# Patient Record
Sex: Male | Born: 1935 | Race: White | Hispanic: No | Marital: Married | Smoking: Never smoker
Health system: Southern US, Community
[De-identification: ages and names within clinical notes are randomized; demographics above are authoritative.]

## PROBLEM LIST (undated history)

## (undated) DIAGNOSIS — K7030 Alcoholic cirrhosis of liver without ascites: Secondary | ICD-10-CM

## (undated) DIAGNOSIS — N183 Chronic kidney disease, stage 3 (moderate): Secondary | ICD-10-CM

## (undated) DIAGNOSIS — E785 Hyperlipidemia, unspecified: Secondary | ICD-10-CM

## (undated) DIAGNOSIS — K3189 Other diseases of stomach and duodenum: Secondary | ICD-10-CM

## (undated) DIAGNOSIS — I5041 Acute combined systolic (congestive) and diastolic (congestive) heart failure: Secondary | ICD-10-CM

## (undated) DIAGNOSIS — K269 Duodenal ulcer, unspecified as acute or chronic, without hemorrhage or perforation: Secondary | ICD-10-CM

## (undated) DIAGNOSIS — N289 Disorder of kidney and ureter, unspecified: Secondary | ICD-10-CM

## (undated) DIAGNOSIS — R188 Other ascites: Secondary | ICD-10-CM

## (undated) DIAGNOSIS — M6282 Rhabdomyolysis: Secondary | ICD-10-CM

## (undated) DIAGNOSIS — I10 Essential (primary) hypertension: Secondary | ICD-10-CM

## (undated) DIAGNOSIS — G312 Degeneration of nervous system due to alcohol: Secondary | ICD-10-CM

## (undated) DIAGNOSIS — K635 Polyp of colon: Secondary | ICD-10-CM

## (undated) DIAGNOSIS — F1020 Alcohol dependence, uncomplicated: Secondary | ICD-10-CM

## (undated) DIAGNOSIS — K766 Portal hypertension: Secondary | ICD-10-CM

## (undated) DIAGNOSIS — F0390 Unspecified dementia without behavioral disturbance: Secondary | ICD-10-CM

## (undated) DIAGNOSIS — I422 Other hypertrophic cardiomyopathy: Secondary | ICD-10-CM

## (undated) DIAGNOSIS — K2950 Unspecified chronic gastritis without bleeding: Secondary | ICD-10-CM

## (undated) DIAGNOSIS — I350 Nonrheumatic aortic (valve) stenosis: Secondary | ICD-10-CM

## (undated) DIAGNOSIS — K5730 Diverticulosis of large intestine without perforation or abscess without bleeding: Secondary | ICD-10-CM

## (undated) DIAGNOSIS — S62109A Fracture of unspecified carpal bone, unspecified wrist, initial encounter for closed fracture: Secondary | ICD-10-CM

## (undated) DIAGNOSIS — J9600 Acute respiratory failure, unspecified whether with hypoxia or hypercapnia: Secondary | ICD-10-CM

## (undated) DIAGNOSIS — K219 Gastro-esophageal reflux disease without esophagitis: Secondary | ICD-10-CM

## (undated) HISTORY — DX: Chronic kidney disease, stage 3 (moderate): N18.3

## (undated) HISTORY — DX: Unspecified chronic gastritis without bleeding: K29.50

## (undated) HISTORY — DX: Gastro-esophageal reflux disease without esophagitis: K21.9

## (undated) HISTORY — DX: Other hypertrophic cardiomyopathy: I42.2

## (undated) HISTORY — DX: Hyperlipidemia, unspecified: E78.5

## (undated) HISTORY — DX: Alcoholic cirrhosis of liver without ascites: K70.30

## (undated) HISTORY — DX: Essential (primary) hypertension: I10

## (undated) HISTORY — PX: INGUINAL HERNIA REPAIR: SHX194

## (undated) HISTORY — DX: Alcohol dependence, uncomplicated: F10.20

## (undated) HISTORY — DX: Duodenal ulcer, unspecified as acute or chronic, without hemorrhage or perforation: K26.9

## (undated) HISTORY — DX: Disorder of kidney and ureter, unspecified: N28.9

---

## 1999-02-24 ENCOUNTER — Other Ambulatory Visit: Admission: RE | Admit: 1999-02-24 | Discharge: 1999-02-24 | Payer: Self-pay | Admitting: Gastroenterology

## 2004-09-19 ENCOUNTER — Ambulatory Visit: Payer: Self-pay | Admitting: Internal Medicine

## 2004-10-17 ENCOUNTER — Ambulatory Visit: Payer: Self-pay | Admitting: Internal Medicine

## 2004-10-31 ENCOUNTER — Ambulatory Visit: Payer: Self-pay | Admitting: Gastroenterology

## 2004-11-04 ENCOUNTER — Ambulatory Visit (HOSPITAL_COMMUNITY): Admission: RE | Admit: 2004-11-04 | Discharge: 2004-11-04 | Payer: Self-pay | Admitting: Gastroenterology

## 2004-11-09 DIAGNOSIS — K3189 Other diseases of stomach and duodenum: Secondary | ICD-10-CM

## 2004-11-09 DIAGNOSIS — R188 Other ascites: Secondary | ICD-10-CM

## 2004-11-09 HISTORY — DX: Other diseases of stomach and duodenum: K31.89

## 2004-11-09 HISTORY — DX: Other ascites: R18.8

## 2004-11-11 ENCOUNTER — Ambulatory Visit: Payer: Self-pay | Admitting: *Deleted

## 2004-11-11 ENCOUNTER — Ambulatory Visit: Payer: Self-pay | Admitting: Gastroenterology

## 2004-11-18 ENCOUNTER — Ambulatory Visit: Payer: Self-pay | Admitting: *Deleted

## 2004-11-18 ENCOUNTER — Ambulatory Visit: Payer: Self-pay | Admitting: Gastroenterology

## 2004-11-19 ENCOUNTER — Ambulatory Visit: Payer: Self-pay | Admitting: Internal Medicine

## 2004-11-19 ENCOUNTER — Inpatient Hospital Stay (HOSPITAL_COMMUNITY): Admission: AD | Admit: 2004-11-19 | Discharge: 2004-11-21 | Payer: Self-pay | Admitting: Gastroenterology

## 2004-11-19 ENCOUNTER — Ambulatory Visit: Payer: Self-pay | Admitting: Infectious Diseases

## 2004-11-19 ENCOUNTER — Encounter (INDEPENDENT_AMBULATORY_CARE_PROVIDER_SITE_OTHER): Payer: Self-pay | Admitting: Specialist

## 2004-12-04 ENCOUNTER — Ambulatory Visit: Payer: Self-pay | Admitting: Internal Medicine

## 2004-12-04 ENCOUNTER — Ambulatory Visit: Payer: Self-pay | Admitting: Gastroenterology

## 2005-01-05 ENCOUNTER — Ambulatory Visit: Payer: Self-pay | Admitting: Gastroenterology

## 2005-01-06 ENCOUNTER — Ambulatory Visit: Payer: Self-pay | Admitting: Gastroenterology

## 2005-01-07 DIAGNOSIS — F1020 Alcohol dependence, uncomplicated: Secondary | ICD-10-CM

## 2005-01-07 DIAGNOSIS — G312 Degeneration of nervous system due to alcohol: Secondary | ICD-10-CM

## 2005-01-07 DIAGNOSIS — M6282 Rhabdomyolysis: Secondary | ICD-10-CM

## 2005-01-07 HISTORY — DX: Alcohol dependence, uncomplicated: F10.20

## 2005-01-07 HISTORY — DX: Rhabdomyolysis: M62.82

## 2005-01-07 HISTORY — DX: Degeneration of nervous system due to alcohol: G31.2

## 2005-01-14 ENCOUNTER — Inpatient Hospital Stay (HOSPITAL_COMMUNITY): Admission: EM | Admit: 2005-01-14 | Discharge: 2005-01-17 | Payer: Self-pay | Admitting: Emergency Medicine

## 2005-01-14 ENCOUNTER — Ambulatory Visit: Payer: Self-pay | Admitting: Internal Medicine

## 2005-01-20 ENCOUNTER — Ambulatory Visit: Payer: Self-pay | Admitting: Gastroenterology

## 2005-01-26 ENCOUNTER — Ambulatory Visit: Payer: Self-pay | Admitting: Internal Medicine

## 2005-01-27 ENCOUNTER — Ambulatory Visit: Payer: Self-pay | Admitting: Gastroenterology

## 2005-02-19 ENCOUNTER — Ambulatory Visit: Payer: Self-pay | Admitting: Gastroenterology

## 2005-03-03 ENCOUNTER — Ambulatory Visit: Payer: Self-pay | Admitting: Gastroenterology

## 2005-03-24 ENCOUNTER — Ambulatory Visit: Payer: Self-pay | Admitting: Gastroenterology

## 2005-05-19 ENCOUNTER — Ambulatory Visit: Payer: Self-pay | Admitting: Gastroenterology

## 2005-06-16 ENCOUNTER — Ambulatory Visit: Payer: Self-pay | Admitting: Gastroenterology

## 2005-07-24 ENCOUNTER — Ambulatory Visit: Payer: Self-pay | Admitting: Gastroenterology

## 2005-09-24 ENCOUNTER — Ambulatory Visit: Payer: Self-pay | Admitting: Gastroenterology

## 2005-10-06 ENCOUNTER — Ambulatory Visit: Payer: Self-pay | Admitting: *Deleted

## 2005-10-23 ENCOUNTER — Ambulatory Visit: Payer: Self-pay

## 2005-12-25 ENCOUNTER — Ambulatory Visit: Payer: Self-pay | Admitting: Gastroenterology

## 2006-01-07 ENCOUNTER — Ambulatory Visit: Payer: Self-pay | Admitting: *Deleted

## 2006-02-04 ENCOUNTER — Ambulatory Visit: Payer: Self-pay | Admitting: Gastroenterology

## 2006-04-02 ENCOUNTER — Ambulatory Visit: Payer: Self-pay | Admitting: Gastroenterology

## 2006-06-15 ENCOUNTER — Ambulatory Visit: Payer: Self-pay | Admitting: Gastroenterology

## 2006-07-08 ENCOUNTER — Ambulatory Visit: Payer: Self-pay | Admitting: *Deleted

## 2006-07-22 ENCOUNTER — Ambulatory Visit: Payer: Self-pay | Admitting: *Deleted

## 2006-07-22 ENCOUNTER — Ambulatory Visit: Payer: Self-pay

## 2006-07-22 ENCOUNTER — Encounter: Payer: Self-pay | Admitting: Cardiology

## 2006-09-10 ENCOUNTER — Ambulatory Visit: Payer: Self-pay | Admitting: Gastroenterology

## 2006-11-26 ENCOUNTER — Ambulatory Visit: Payer: Self-pay | Admitting: *Deleted

## 2007-04-08 ENCOUNTER — Ambulatory Visit: Payer: Self-pay | Admitting: *Deleted

## 2007-04-15 ENCOUNTER — Ambulatory Visit: Payer: Self-pay | Admitting: *Deleted

## 2007-04-15 LAB — CONVERTED CEMR LAB
ALT: 34 units/L (ref 0–40)
AST: 46 units/L — ABNORMAL HIGH (ref 0–37)
Albumin: 3.7 g/dL (ref 3.5–5.2)
Alkaline Phosphatase: 162 units/L — ABNORMAL HIGH (ref 39–117)
BUN: 22 mg/dL (ref 6–23)
Bilirubin, Direct: 0.2 mg/dL (ref 0.0–0.3)
CO2: 27 meq/L (ref 19–32)
Calcium: 9.3 mg/dL (ref 8.4–10.5)
Chloride: 102 meq/L (ref 96–112)
Cholesterol: 130 mg/dL (ref 0–200)
Creatinine, Ser: 1.3 mg/dL (ref 0.4–1.5)
GFR calc Af Amer: 69 mL/min
GFR calc non Af Amer: 57 mL/min
Glucose, Bld: 151 mg/dL — ABNORMAL HIGH (ref 70–99)
HDL: 52.1 mg/dL (ref >39.0)
Hgb A1c MFr Bld: 6.6 % — ABNORMAL HIGH (ref 4.6–6.0)
LDL Cholesterol: 62 mg/dL (ref 0–99)
Potassium: 4.7 meq/L (ref 3.5–5.1)
Sodium: 136 meq/L (ref 135–145)
Total Bilirubin: 1.4 mg/dL — ABNORMAL HIGH (ref 0.3–1.2)
Total CHOL/HDL Ratio: 2.5
Total Protein: 7.6 g/dL (ref 6.0–8.3)
Triglycerides: 78 mg/dL (ref 0–149)
VLDL: 16 mg/dL (ref 0–40)

## 2007-07-07 ENCOUNTER — Ambulatory Visit: Payer: Self-pay

## 2007-07-07 ENCOUNTER — Encounter: Payer: Self-pay | Admitting: Cardiology

## 2007-07-19 ENCOUNTER — Ambulatory Visit: Payer: Self-pay | Admitting: Cardiology

## 2007-08-18 ENCOUNTER — Ambulatory Visit: Payer: Self-pay | Admitting: Cardiology

## 2007-08-18 LAB — CONVERTED CEMR LAB
BUN: 17 mg/dL (ref 6–23)
Basophils Absolute: 0 10*3/uL (ref 0.0–0.1)
Basophils Relative: 0.1 % (ref 0.0–1.0)
CO2: 28 meq/L (ref 19–32)
Calcium: 9 mg/dL (ref 8.4–10.5)
Chloride: 104 meq/L (ref 96–112)
Creatinine, Ser: 1.3 mg/dL (ref 0.4–1.5)
Eosinophils Absolute: 0.3 10*3/uL (ref 0.0–0.6)
Eosinophils Relative: 3.1 % (ref 0.0–5.0)
GFR calc Af Amer: 69 mL/min
GFR calc non Af Amer: 57 mL/min
Glucose, Bld: 146 mg/dL — ABNORMAL HIGH (ref 70–99)
HCT: 40.9 % (ref 39.0–52.0)
Hemoglobin: 13.7 g/dL (ref 13.0–17.0)
Hgb A1c MFr Bld: 6.3 % — ABNORMAL HIGH (ref 4.6–6.0)
Lymphocytes Relative: 10.2 % — ABNORMAL LOW (ref 12.0–46.0)
MCHC: 33.6 g/dL (ref 30.0–36.0)
MCV: 94.9 fL (ref 78.0–100.0)
Monocytes Absolute: 1 10*3/uL — ABNORMAL HIGH (ref 0.2–0.7)
Monocytes Relative: 10.1 % (ref 3.0–11.0)
Neutro Abs: 7.8 10*3/uL — ABNORMAL HIGH (ref 1.4–7.7)
Neutrophils Relative %: 76.5 % (ref 43.0–77.0)
Platelets: 181 10*3/uL (ref 150–400)
Potassium: 4.1 meq/L (ref 3.5–5.1)
RBC: 4.32 M/uL (ref 4.22–5.81)
RDW: 12.7 % (ref 11.5–14.6)
Sodium: 140 meq/L (ref 135–145)
WBC: 10.1 10*3/uL (ref 4.5–10.5)

## 2007-09-20 ENCOUNTER — Ambulatory Visit: Payer: Self-pay | Admitting: Cardiology

## 2007-09-20 LAB — CONVERTED CEMR LAB
ALT: 29 units/L (ref 0–53)
AST: 52 units/L — ABNORMAL HIGH (ref 0–37)
Albumin: 3.8 g/dL (ref 3.5–5.2)
Alkaline Phosphatase: 129 units/L — ABNORMAL HIGH (ref 39–117)
BUN: 10 mg/dL (ref 6–23)
Basophils Absolute: 0 10*3/uL (ref 0.0–0.1)
Basophils Relative: 0.4 % (ref 0.0–1.0)
Bilirubin, Direct: 0.5 mg/dL — ABNORMAL HIGH (ref 0.0–0.3)
CO2: 27 meq/L (ref 19–32)
Calcium: 9.1 mg/dL (ref 8.4–10.5)
Chloride: 100 meq/L (ref 96–112)
Creatinine, Ser: 1.3 mg/dL (ref 0.4–1.5)
Eosinophils Absolute: 0.2 10*3/uL (ref 0.0–0.6)
Eosinophils Relative: 1.4 % (ref 0.0–5.0)
GFR calc Af Amer: 69 mL/min
GFR calc non Af Amer: 57 mL/min
Glucose, Bld: 116 mg/dL — ABNORMAL HIGH (ref 70–99)
HCT: 34.7 % — ABNORMAL LOW (ref 39.0–52.0)
Hemoglobin: 12.3 g/dL — ABNORMAL LOW (ref 13.0–17.0)
INR: 1.1 — ABNORMAL HIGH (ref 0.8–1.0)
Lymphocytes Relative: 7.2 % — ABNORMAL LOW (ref 12.0–46.0)
MCHC: 35.4 g/dL (ref 30.0–36.0)
MCV: 93.6 fL (ref 78.0–100.0)
Monocytes Absolute: 1.4 10*3/uL — ABNORMAL HIGH (ref 0.2–0.7)
Monocytes Relative: 11.7 % — ABNORMAL HIGH (ref 3.0–11.0)
Neutro Abs: 9.6 10*3/uL — ABNORMAL HIGH (ref 1.4–7.7)
Neutrophils Relative %: 79.3 % — ABNORMAL HIGH (ref 43.0–77.0)
Platelets: 253 10*3/uL (ref 150–400)
Potassium: 4.4 meq/L (ref 3.5–5.1)
Prothrombin Time: 12.6 s (ref 10.9–13.3)
RBC: 3.71 M/uL — ABNORMAL LOW (ref 4.22–5.81)
RDW: 12.9 % (ref 11.5–14.6)
Sodium: 136 meq/L (ref 135–145)
Total Bilirubin: 1.8 mg/dL — ABNORMAL HIGH (ref 0.3–1.2)
Total Protein: 8 g/dL (ref 6.0–8.3)
WBC: 12.1 10*3/uL — ABNORMAL HIGH (ref 4.5–10.5)

## 2007-09-23 ENCOUNTER — Ambulatory Visit: Payer: Self-pay | Admitting: Cardiology

## 2007-09-23 LAB — CONVERTED CEMR LAB
Ammonia: 35 umol/L (ref 11–35)
BUN: 13 mg/dL (ref 6–23)
CO2: 28 meq/L (ref 19–32)
Calcium: 9.4 mg/dL (ref 8.4–10.5)
Chloride: 98 meq/L (ref 96–112)
Creatinine, Ser: 1.3 mg/dL (ref 0.4–1.5)
GFR calc Af Amer: 69 mL/min
GFR calc non Af Amer: 57 mL/min
Glucose, Bld: 154 mg/dL — ABNORMAL HIGH (ref 70–99)
Potassium: 4.6 meq/L (ref 3.5–5.1)
Sodium: 134 meq/L — ABNORMAL LOW (ref 135–145)

## 2007-09-28 ENCOUNTER — Ambulatory Visit: Payer: Self-pay | Admitting: Cardiology

## 2007-09-28 LAB — CONVERTED CEMR LAB
BUN: 24 mg/dL — ABNORMAL HIGH (ref 6–23)
CO2: 25 meq/L (ref 19–32)
Calcium: 9.5 mg/dL (ref 8.4–10.5)
Chloride: 97 meq/L (ref 96–112)
Creatinine, Ser: 1.5 mg/dL (ref 0.4–1.5)
GFR calc Af Amer: 59 mL/min
GFR calc non Af Amer: 48 mL/min
Glucose, Bld: 107 mg/dL — ABNORMAL HIGH (ref 70–99)
Potassium: 4.6 meq/L (ref 3.5–5.1)
Sodium: 132 meq/L — ABNORMAL LOW (ref 135–145)

## 2007-11-25 ENCOUNTER — Ambulatory Visit: Payer: Self-pay | Admitting: Cardiology

## 2007-12-22 ENCOUNTER — Ambulatory Visit: Payer: Self-pay | Admitting: Cardiology

## 2007-12-22 LAB — CONVERTED CEMR LAB
BUN: 25 mg/dL — ABNORMAL HIGH (ref 6–23)
Basophils Absolute: 0 10*3/uL (ref 0.0–0.1)
Basophils Relative: 0 % (ref 0.0–1.0)
CO2: 26 meq/L (ref 19–32)
Calcium: 9.2 mg/dL (ref 8.4–10.5)
Chloride: 103 meq/L (ref 96–112)
Creatinine, Ser: 1.2 mg/dL (ref 0.4–1.5)
Eosinophils Absolute: 0.3 10*3/uL (ref 0.0–0.6)
Eosinophils Relative: 2.9 % (ref 0.0–5.0)
GFR calc Af Amer: 76 mL/min
GFR calc non Af Amer: 63 mL/min
Glucose, Bld: 142 mg/dL — ABNORMAL HIGH (ref 70–99)
HCT: 42.4 % (ref 39.0–52.0)
Hemoglobin: 14.3 g/dL (ref 13.0–17.0)
Hgb A1c MFr Bld: 6.1 % — ABNORMAL HIGH (ref 4.6–6.0)
Lymphocytes Relative: 11.1 % — ABNORMAL LOW (ref 12.0–46.0)
MCHC: 33.7 g/dL (ref 30.0–36.0)
MCV: 94.1 fL (ref 78.0–100.0)
Monocytes Absolute: 1 10*3/uL — ABNORMAL HIGH (ref 0.2–0.7)
Monocytes Relative: 10.5 % (ref 3.0–11.0)
Neutro Abs: 7.3 10*3/uL (ref 1.4–7.7)
Neutrophils Relative %: 75.5 % (ref 43.0–77.0)
Platelets: 172 10*3/uL (ref 150–400)
Potassium: 4.4 meq/L (ref 3.5–5.1)
RBC: 4.51 M/uL (ref 4.22–5.81)
RDW: 13.9 % (ref 11.5–14.6)
Sodium: 136 meq/L (ref 135–145)
WBC: 9.7 10*3/uL (ref 4.5–10.5)

## 2008-01-03 ENCOUNTER — Emergency Department (HOSPITAL_COMMUNITY): Admission: EM | Admit: 2008-01-03 | Discharge: 2008-01-04 | Payer: Self-pay | Admitting: Emergency Medicine

## 2008-01-04 ENCOUNTER — Ambulatory Visit: Payer: Self-pay | Admitting: Cardiology

## 2008-01-23 ENCOUNTER — Encounter: Admission: RE | Admit: 2008-01-23 | Discharge: 2008-01-23 | Payer: Self-pay | Admitting: Neurology

## 2008-02-01 ENCOUNTER — Ambulatory Visit: Payer: Self-pay | Admitting: Cardiology

## 2008-02-03 ENCOUNTER — Encounter: Admission: RE | Admit: 2008-02-03 | Discharge: 2008-02-03 | Payer: Self-pay | Admitting: Cardiology

## 2008-04-09 ENCOUNTER — Telehealth: Payer: Self-pay | Admitting: Internal Medicine

## 2008-04-09 ENCOUNTER — Telehealth: Payer: Self-pay | Admitting: Gastroenterology

## 2008-04-18 ENCOUNTER — Telehealth: Payer: Self-pay | Admitting: Internal Medicine

## 2008-04-26 ENCOUNTER — Telehealth: Payer: Self-pay | Admitting: Internal Medicine

## 2008-05-24 ENCOUNTER — Ambulatory Visit: Payer: Self-pay | Admitting: Cardiology

## 2008-05-29 ENCOUNTER — Ambulatory Visit: Payer: Self-pay | Admitting: Cardiology

## 2008-05-29 LAB — CONVERTED CEMR LAB
ALT: 37 units/L (ref 0–53)
AST: 43 units/L — ABNORMAL HIGH (ref 0–37)
Albumin: 4.1 g/dL (ref 3.5–5.2)
Alkaline Phosphatase: 106 units/L (ref 39–117)
BUN: 27 mg/dL — ABNORMAL HIGH (ref 6–23)
Basophils Absolute: 0.1 10*3/uL (ref 0.0–0.1)
Basophils Relative: 1 % (ref 0.0–3.0)
Bilirubin, Direct: 0.2 mg/dL (ref 0.0–0.3)
CO2: 27 meq/L (ref 19–32)
Calcium: 9.1 mg/dL (ref 8.4–10.5)
Chloride: 99 meq/L (ref 96–112)
Cholesterol: 107 mg/dL (ref 0–200)
Creatinine, Ser: 1.5 mg/dL (ref 0.4–1.5)
Eosinophils Absolute: 0.3 10*3/uL (ref 0.0–0.7)
Eosinophils Relative: 4.1 % (ref 0.0–5.0)
GFR calc Af Amer: 59 mL/min
GFR calc non Af Amer: 48 mL/min
Glucose, Bld: 131 mg/dL — ABNORMAL HIGH (ref 70–99)
HCT: 40.2 % (ref 39.0–52.0)
HDL: 50.6 mg/dL (ref >39.0)
Hemoglobin: 14 g/dL (ref 13.0–17.0)
LDL Cholesterol: 49 mg/dL (ref 0–99)
Lymphocytes Relative: 15.1 % (ref 12.0–46.0)
MCHC: 34.8 g/dL (ref 30.0–36.0)
MCV: 93.8 fL (ref 78.0–100.0)
Monocytes Absolute: 1 10*3/uL (ref 0.1–1.0)
Monocytes Relative: 12.2 % — ABNORMAL HIGH (ref 3.0–12.0)
Neutro Abs: 5.3 10*3/uL (ref 1.4–7.7)
Neutrophils Relative %: 67.6 % (ref 43.0–77.0)
Platelets: 166 10*3/uL (ref 150–400)
Potassium: 4.9 meq/L (ref 3.5–5.1)
RBC: 4.29 M/uL (ref 4.22–5.81)
RDW: 12.9 % (ref 11.5–14.6)
Sodium: 135 meq/L (ref 135–145)
TSH: 0.72 microintl units/mL (ref 0.35–5.50)
Total Bilirubin: 0.7 mg/dL (ref 0.3–1.2)
Total CHOL/HDL Ratio: 2.1
Total Protein: 7.6 g/dL (ref 6.0–8.3)
Triglycerides: 38 mg/dL (ref 0–149)
VLDL: 8 mg/dL (ref 0–40)
WBC: 7.9 10*3/uL (ref 4.5–10.5)

## 2008-06-06 ENCOUNTER — Telehealth: Payer: Self-pay | Admitting: Internal Medicine

## 2008-07-09 ENCOUNTER — Telehealth (INDEPENDENT_AMBULATORY_CARE_PROVIDER_SITE_OTHER): Payer: Self-pay | Admitting: *Deleted

## 2008-07-13 ENCOUNTER — Ambulatory Visit: Payer: Self-pay | Admitting: Internal Medicine

## 2008-08-23 ENCOUNTER — Encounter: Payer: Self-pay | Admitting: Internal Medicine

## 2008-08-27 ENCOUNTER — Encounter: Payer: Self-pay | Admitting: Internal Medicine

## 2008-10-02 ENCOUNTER — Telehealth: Payer: Self-pay | Admitting: Internal Medicine

## 2008-11-05 ENCOUNTER — Telehealth: Payer: Self-pay | Admitting: Internal Medicine

## 2008-11-14 ENCOUNTER — Ambulatory Visit: Payer: Self-pay | Admitting: Cardiology

## 2008-11-14 LAB — CONVERTED CEMR LAB
BUN: 21 mg/dL (ref 6–23)
Basophils Absolute: 0.3 10*3/uL — ABNORMAL HIGH (ref 0.0–0.1)
Basophils Relative: 2.8 % (ref 0.0–3.0)
CO2: 25 meq/L (ref 19–32)
Calcium: 9.2 mg/dL (ref 8.4–10.5)
Chloride: 101 meq/L (ref 96–112)
Creatinine, Ser: 1.4 mg/dL (ref 0.4–1.5)
Eosinophils Absolute: 0.4 10*3/uL (ref 0.0–0.7)
Eosinophils Relative: 4.1 % (ref 0.0–5.0)
GFR calc Af Amer: 63 mL/min
GFR calc non Af Amer: 52 mL/min
Glucose, Bld: 94 mg/dL (ref 70–99)
HCT: 39.8 % (ref 39.0–52.0)
Hemoglobin: 13.9 g/dL (ref 13.0–17.0)
Lymphocytes Relative: 10.8 % — ABNORMAL LOW (ref 12.0–46.0)
MCHC: 35.1 g/dL (ref 30.0–36.0)
MCV: 96.5 fL (ref 78.0–100.0)
Monocytes Absolute: 0.4 10*3/uL (ref 0.1–1.0)
Monocytes Relative: 3.7 % (ref 3.0–12.0)
Neutro Abs: 7.4 10*3/uL (ref 1.4–7.7)
Neutrophils Relative %: 78.6 % — ABNORMAL HIGH (ref 43.0–77.0)
Platelets: 172 10*3/uL (ref 150–400)
Potassium: 5.6 meq/L — ABNORMAL HIGH (ref 3.5–5.1)
RBC: 4.12 M/uL — ABNORMAL LOW (ref 4.22–5.81)
RDW: 11.9 % (ref 11.5–14.6)
Sodium: 135 meq/L (ref 135–145)
WBC: 9.5 10*3/uL (ref 4.5–10.5)

## 2008-11-20 ENCOUNTER — Ambulatory Visit: Payer: Self-pay | Admitting: Cardiology

## 2008-11-20 LAB — CONVERTED CEMR LAB
BUN: 18 mg/dL (ref 6–23)
CO2: 27 meq/L (ref 19–32)
Calcium: 9.2 mg/dL (ref 8.4–10.5)
Chloride: 102 meq/L (ref 96–112)
Creatinine, Ser: 1.4 mg/dL (ref 0.4–1.5)
GFR calc Af Amer: 63 mL/min
GFR calc non Af Amer: 52 mL/min
Glucose, Bld: 173 mg/dL — ABNORMAL HIGH (ref 70–99)
Potassium: 4.8 meq/L (ref 3.5–5.1)
Sodium: 135 meq/L (ref 135–145)

## 2009-04-22 ENCOUNTER — Telehealth: Payer: Self-pay | Admitting: Internal Medicine

## 2009-05-06 ENCOUNTER — Telehealth (INDEPENDENT_AMBULATORY_CARE_PROVIDER_SITE_OTHER): Payer: Self-pay | Admitting: *Deleted

## 2009-06-23 DIAGNOSIS — E785 Hyperlipidemia, unspecified: Secondary | ICD-10-CM

## 2009-06-23 DIAGNOSIS — I421 Obstructive hypertrophic cardiomyopathy: Secondary | ICD-10-CM

## 2009-06-23 DIAGNOSIS — I10 Essential (primary) hypertension: Secondary | ICD-10-CM

## 2009-06-25 ENCOUNTER — Ambulatory Visit: Payer: Self-pay | Admitting: Cardiology

## 2009-07-03 ENCOUNTER — Encounter: Payer: Self-pay | Admitting: Cardiology

## 2009-07-03 ENCOUNTER — Ambulatory Visit: Payer: Self-pay

## 2009-07-03 ENCOUNTER — Ambulatory Visit: Payer: Self-pay | Admitting: Cardiology

## 2009-07-05 LAB — CONVERTED CEMR LAB
ALT: 17 units/L (ref 0–53)
AST: 22 units/L (ref 0–37)
Albumin: 4 g/dL (ref 3.5–5.2)
Alkaline Phosphatase: 73 units/L (ref 39–117)
Basophils Absolute: 0 10*3/uL (ref 0.0–0.1)
Basophils Relative: 0 % (ref 0.0–3.0)
Bilirubin, Direct: 0.2 mg/dL (ref 0.0–0.3)
Cholesterol: 129 mg/dL (ref 0–200)
Eosinophils Absolute: 0.3 10*3/uL (ref 0.0–0.7)
Eosinophils Relative: 3.8 % (ref 0.0–5.0)
HCT: 43.7 % (ref 39.0–52.0)
HDL: 55.9 mg/dL (ref >39.00)
Hemoglobin: 15.2 g/dL (ref 13.0–17.0)
LDL Cholesterol: 57 mg/dL (ref 0–99)
Lymphocytes Relative: 10.8 % — ABNORMAL LOW (ref 12.0–46.0)
Lymphs Abs: 0.9 10*3/uL (ref 0.7–4.0)
MCHC: 34.7 g/dL (ref 30.0–36.0)
MCV: 98.1 fL (ref 78.0–100.0)
Monocytes Absolute: 1 10*3/uL (ref 0.1–1.0)
Monocytes Relative: 11.5 % (ref 3.0–12.0)
Neutro Abs: 6.1 10*3/uL (ref 1.4–7.7)
Neutrophils Relative %: 73.9 % (ref 43.0–77.0)
Platelets: 160 10*3/uL (ref 150.0–400.0)
RBC: 4.45 M/uL (ref 4.22–5.81)
RDW: 12 % (ref 11.5–14.6)
TSH: 0.97 microintl units/mL (ref 0.35–5.50)
Total Bilirubin: 1.4 mg/dL — ABNORMAL HIGH (ref 0.3–1.2)
Total CHOL/HDL Ratio: 2
Total Protein: 8.1 g/dL (ref 6.0–8.3)
Triglycerides: 81 mg/dL (ref 0.0–149.0)
VLDL: 16.2 mg/dL (ref 0.0–40.0)
WBC: 8.3 10*3/uL (ref 4.5–10.5)

## 2009-07-29 ENCOUNTER — Encounter: Payer: Self-pay | Admitting: Cardiology

## 2009-09-05 ENCOUNTER — Ambulatory Visit: Payer: Self-pay | Admitting: Cardiology

## 2009-09-05 DIAGNOSIS — F1010 Alcohol abuse, uncomplicated: Secondary | ICD-10-CM

## 2009-09-05 DIAGNOSIS — I714 Abdominal aortic aneurysm, without rupture, unspecified: Secondary | ICD-10-CM | POA: Insufficient documentation

## 2009-09-16 ENCOUNTER — Ambulatory Visit: Payer: Self-pay

## 2009-09-16 ENCOUNTER — Encounter: Payer: Self-pay | Admitting: Cardiology

## 2009-10-01 ENCOUNTER — Telehealth: Payer: Self-pay | Admitting: Internal Medicine

## 2009-12-04 ENCOUNTER — Ambulatory Visit: Payer: Self-pay | Admitting: Cardiology

## 2010-01-14 ENCOUNTER — Telehealth: Payer: Self-pay | Admitting: Internal Medicine

## 2010-03-10 ENCOUNTER — Ambulatory Visit: Payer: Self-pay | Admitting: Cardiology

## 2010-03-19 ENCOUNTER — Telehealth: Payer: Self-pay | Admitting: Internal Medicine

## 2010-04-14 ENCOUNTER — Telehealth: Payer: Self-pay | Admitting: Cardiology

## 2010-04-14 ENCOUNTER — Telehealth: Payer: Self-pay | Admitting: Internal Medicine

## 2010-04-15 ENCOUNTER — Telehealth: Payer: Self-pay | Admitting: Cardiology

## 2010-05-07 ENCOUNTER — Telehealth: Payer: Self-pay | Admitting: Internal Medicine

## 2010-07-09 ENCOUNTER — Ambulatory Visit: Payer: Self-pay | Admitting: Cardiology

## 2010-07-11 ENCOUNTER — Ambulatory Visit: Payer: Self-pay | Admitting: Cardiology

## 2010-07-11 LAB — CONVERTED CEMR LAB
ALT: 16 units/L (ref 0–53)
AST: 22 units/L (ref 0–37)
Albumin: 4.1 g/dL (ref 3.5–5.2)
Alkaline Phosphatase: 66 units/L (ref 39–117)
BUN: 17 mg/dL (ref 6–23)
Basophils Absolute: 0 10*3/uL (ref 0.0–0.1)
Basophils Relative: 0.6 % (ref 0.0–3.0)
Bilirubin, Direct: 0.2 mg/dL (ref 0.0–0.3)
CO2: 25 meq/L (ref 19–32)
Calcium: 8.9 mg/dL (ref 8.4–10.5)
Chloride: 99 meq/L (ref 96–112)
Cholesterol: 127 mg/dL (ref 0–200)
Creatinine, Ser: 1.3 mg/dL (ref 0.4–1.5)
Eosinophils Absolute: 0.3 10*3/uL (ref 0.0–0.7)
Eosinophils Relative: 3.8 % (ref 0.0–5.0)
GFR calc non Af Amer: 55.6 mL/min (ref >60)
Glucose, Bld: 117 mg/dL — ABNORMAL HIGH (ref 70–99)
HCT: 41.6 % (ref 39.0–52.0)
HDL: 47.7 mg/dL (ref >39.00)
Hemoglobin: 14.3 g/dL (ref 13.0–17.0)
LDL Cholesterol: 58 mg/dL (ref 0–99)
Lymphocytes Relative: 14.4 % (ref 12.0–46.0)
Lymphs Abs: 1.1 10*3/uL (ref 0.7–4.0)
MCHC: 34.3 g/dL (ref 30.0–36.0)
MCV: 99.5 fL (ref 78.0–100.0)
Monocytes Absolute: 0.8 10*3/uL (ref 0.1–1.0)
Monocytes Relative: 10.4 % (ref 3.0–12.0)
Neutro Abs: 5.5 10*3/uL (ref 1.4–7.7)
Neutrophils Relative %: 70.8 % (ref 43.0–77.0)
Platelets: 190 10*3/uL (ref 150.0–400.0)
Potassium: 4.5 meq/L (ref 3.5–5.1)
RBC: 4.18 M/uL — ABNORMAL LOW (ref 4.22–5.81)
RDW: 12.6 % (ref 11.5–14.6)
Sodium: 133 meq/L — ABNORMAL LOW (ref 135–145)
TSH: 0.86 microintl units/mL (ref 0.35–5.50)
Total Bilirubin: 0.9 mg/dL (ref 0.3–1.2)
Total CHOL/HDL Ratio: 3
Total Protein: 7 g/dL (ref 6.0–8.3)
Triglycerides: 105 mg/dL (ref 0.0–149.0)
VLDL: 21 mg/dL (ref 0.0–40.0)
WBC: 7.8 10*3/uL (ref 4.5–10.5)

## 2010-08-12 ENCOUNTER — Encounter: Payer: Self-pay | Admitting: Cardiology

## 2010-08-28 ENCOUNTER — Telehealth: Payer: Self-pay | Admitting: Internal Medicine

## 2010-10-10 ENCOUNTER — Encounter: Payer: Self-pay | Admitting: Cardiology

## 2010-10-10 ENCOUNTER — Ambulatory Visit: Payer: Self-pay | Admitting: Cardiology

## 2010-10-10 DIAGNOSIS — I872 Venous insufficiency (chronic) (peripheral): Secondary | ICD-10-CM

## 2010-10-20 ENCOUNTER — Ambulatory Visit: Payer: Self-pay | Admitting: Cardiology

## 2010-10-29 ENCOUNTER — Telehealth: Payer: Self-pay | Admitting: Cardiology

## 2010-11-05 ENCOUNTER — Encounter (INDEPENDENT_AMBULATORY_CARE_PROVIDER_SITE_OTHER): Payer: Self-pay | Admitting: *Deleted

## 2010-12-11 NOTE — Assessment & Plan Note (Signed)
Summary: 4 MONTH ROV      Allergies Added: NKDA  Visit Type:  Follow-up Primary Provider:  Jacques Navy MD  CC:  nocardaic complaits.  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 He has been doing well since his last visit from a cardiac standpoint with no chest pain shortness of breath or palpitations and minimal edema. He says he gets short of breath more easily when he walks up from taking out the trash when he think this is a gradual change over several years.  He also has a history of hyperlipidemia and venous insufficiency. His recent cholesterol values were quite good with an HDL of 40 and an LDL of 58. His creatinine was 1.3. He also had a recent echocardiogram which showed an intraventricular septal thickness of 13.6 and the posterior wall thickness of 12.3 and no subvalvular gradient. There was some basal septal hypertrophy.  His main problem is related to alcohol he has a history of alcohol abuse and is attending AA meetings 4 times a week. He says he is still drinking some says he goes to a bar by himself and maybe has one drink 2 times a week.  Current Medications (verified): 1)  Zolpidem Tartrate 10 Mg  Tabs (Zolpidem Tartrate) .Marland Kitchen.. 1 At Bedtime As Needed 2)  Omeprazole 40 Mg Cpdr (Omeprazole) .... Take 1 Q Am 3)  Spironolactone 25 Mg Tabs (Spironolactone) .... Take One Tablet Once Daily 4)  Furosemide 20 Mg Tabs (Furosemide) .... Take One Tablet Once Daily 5)  Metoprolol Succinate 100 Mg Xr24h-Tab (Metoprolol Succinate) .... Take 1and 1/2  Tablet By Mouth Daily 6)  Simvastatin 20 Mg Tabs (Simvastatin) .... Take One Tablet By Mouth Daily At Bedtime 7)  Amlodipine Besylate 10 Mg Tabs (Amlodipine Besylate) .... Take One Tablet By Mouth Daily 8)  Aspirin Ec 325 Mg Tbec (Aspirin) .... Take One-Half  Tablet By Mouth Daily  Allergies (verified): No Known Drug Allergies  Past  History:  Past Medical History: Reviewed history from 06/23/2009 and no changes required.  Hyperlipidemia  Hypertension  Cirrhosis  Alcoholic pancreatitis   1. Hypertrophic cardiomyopathy, stable. 2. Venous insufficiency of the lower extremity. 3. Hypertension. 4. Hyperlipidemia with intolerance to LIPITOR. 5. History of alcohol abuse, now stable following treatment at     [facility] 6. History of alcoholic pancreatitis and liver disease.   Review of Systems       ROS is negative except as outlined in HPI.   Vital Signs:  Patient profile:   77 year old male Height:      70 inches Weight:      182 pounds BMI:     26.21 Pulse rate:   69 / minute BP sitting:   140 / 74  (left arm) Cuff size:   regular  Vitals Entered By: Burnett Kanaris, CNA (July 11, 2010 11:44 AM)  Physical Exam  Additional Exam:  Gen. Well-nourished, in no distress   Neck: No JVD, thyroid not enlarged, no carotid bruits Lungs: No tachypnea, clear without rales, rhonchi or wheezes Cardiovascular: Rhythm regular, PMI not displaced,  heart sounds  normal, no murmurs or gallops, no peripheral edema, pulses normal in all 4 extremities. Abdomen: BS normal, abdomen soft and non-tender without masses or organomegaly, no hepatosplenomegaly. MS: No deformities, no cyanosis or clubbing   Neuro:  No focal sns   Skin:  no lesions    Impression & Recommendations:  Problem # 1:  HOCM / IHSS (ICD-425.1) He had a recent echo which showed a mild hypertrophic cardiomyopathy. He's had no symptoms from this was from her stable. His updated medication list for this problem includes:    Spironolactone 25 Mg Tabs (Spironolactone) .Marland Kitchen... Take one tablet once daily    Furosemide 20 Mg Tabs (Furosemide) .Marland Kitchen... Take one tablet once daily    Metoprolol Succinate 100 Mg Xr24h-tab (Metoprolol succinate) .Marland Kitchen... Take 1and 1/2  tablet by mouth daily    Amlodipine Besylate 10 Mg Tabs (Amlodipine besylate) .Marland Kitchen... Take one  tablet by mouth daily    Aspirin Ec 325 Mg Tbec (Aspirin) .Marland Kitchen... Take one-half  tablet by mouth daily  Problem # 2:  HYPERTENSION, BENIGN (ICD-401.1) His high blood pressure is well controlled on current medications. His updated medication list for this problem includes:    Spironolactone 25 Mg Tabs (Spironolactone) .Marland Kitchen... Take one tablet once daily    Furosemide 20 Mg Tabs (Furosemide) .Marland Kitchen... Take one tablet once daily    Metoprolol Succinate 100 Mg Xr24h-tab (Metoprolol succinate) .Marland Kitchen... Take 1and 1/2  tablet by mouth daily    Amlodipine Besylate 10 Mg Tabs (Amlodipine besylate) .Marland Kitchen... Take one tablet by mouth daily    Aspirin Ec 325 Mg Tbec (Aspirin) .Marland Kitchen... Take one-half  tablet by mouth daily  Problem # 3:  ALCOHOL ABUSE (ICD-305.00) [name] has a history of alcohol abuse and is attending AA meetings regularly. Unfortunately he is still drinking some he says he drinks about 2 drinks a week. I counseled him on this.  Patient Instructions: 1)  Your physician recommends that you schedule a follow-up appointment in: 3 months. 2)  Your physician recommends that you continue on your current medications as directed. Please refer to the Current Medication list given to you today.

## 2010-12-11 NOTE — Letter (Signed)
Summary: [facility] Flu Shot  [facility]   Imported By: Marylou Mccoy 08/28/2010 13:52:38  _____________________________________________________________________  External Attachment:    Type:   Image     Comment:   External Document

## 2010-12-11 NOTE — Assessment & Plan Note (Signed)
Summary: f58m  Medications Added FUROSEMIDE 20 MG TABS (FUROSEMIDE) take two tablets by mouth once daily      Allergies Added: NKDA  Visit Type:  3 mo f/u Primary Provider:  Jacques Navy MD  CC:  sob w/exertion...denies any other cardiac complaints today.  History of Present Illness: [name] is 77 years old and return for management of hypertrophic cardiomyopathy and hypertension. He has been doing well since his last visit from a cardiac standpoint with no chest pain shortness of breath or palpitations and minimal edema. He says he gets short of breath more easily when he walks up from taking out the trash when he think this is a gradual change over several years.  He also has a history of hyperlipidemia and venous insufficiency. He also had an echocardiogram which showed an intraventricular septal thickness of 13.6 and the posterior wall thickness of 12.3 and no subvalvular gradient. There was some basal septal hypertrophy.  He has noticed some increased swelling in his lower extremities over the past few months. He said no pain. He does have shortness of breath with exertion but does not feel this is changed.  His main problem is related to alcohol he has a history of alcohol abuse and is attending AA meetings 4 times a week. He says he is still drinking some says he goes to a bar by himself and maybe has one drink 2 times a week.  Current Medications (verified): 1)  Zolpidem Tartrate 10 Mg  Tabs (Zolpidem Tartrate) .Marland Kitchen.. 1 At Bedtime As Needed 2)  Omeprazole 40 Mg Cpdr (Omeprazole) .... Take 1 Q Am 3)  Spironolactone 25 Mg Tabs (Spironolactone) .... Take One Tablet Once Daily 4)  Furosemide 20 Mg Tabs (Furosemide) .... Take One Tablet Once Daily 5)  Metoprolol Succinate 100 Mg Xr24h-Tab (Metoprolol Succinate) .... Take 1and 1/2  Tablet By Mouth Daily 6)  Simvastatin 20 Mg Tabs (Simvastatin) .... Take One Tablet By Mouth Daily At Bedtime 7)  Amlodipine Besylate 10 Mg Tabs  (Amlodipine Besylate) .... Take One Tablet By Mouth Daily 8)  Aspirin Ec 325 Mg Tbec (Aspirin) .... Take One-Half  Tablet By Mouth Daily  Allergies (verified): No Known Drug Allergies  Past History:  Past Medical History: Reviewed history from 06/23/2009 and no changes required.  Hyperlipidemia  Hypertension  Cirrhosis  Alcoholic pancreatitis   1. Hypertrophic cardiomyopathy, stable. 2. Venous insufficiency of the lower extremity. 3. Hypertension. 4. Hyperlipidemia with intolerance to LIPITOR. 5. History of alcohol abuse, now stable following treatment at     [facility]. 6. History of alcoholic pancreatitis and liver disease.   Review of Systems       ROS is negative except as outlined in HPI.   Vital Signs:  Patient profile:   77 year old male Height:      70 inches Weight:      186.75 pounds Pulse rate:   76 / minute Pulse rhythm:   irregular BP sitting:   126 / 58  (left arm) Cuff size:   large  Vitals Entered By: Danielle Rankin, CMA (October 10, 2010 11:30 AM)  Physical Exam  Additional Exam:  Gen. Well-nourished, in no distress   Neck: No JVD, thyroid not enlarged, no carotid bruits Lungs: No tachypnea, clear without rales, rhonchi or wheezes Cardiovascular: Rhythm regular, PMI not displaced,  heart sounds  normal, grade 2/6 systolic ejection murmur at the left sternal edge, 1-2+ bilateral peripheral edema, pulses normal in all 4 extremities. Abdomen:  BS normal, abdomen soft and non-tender without masses or organomegaly, no hepatosplenomegaly. MS: No deformities, no cyanosis or clubbing   Neuro:  No focal sns   Skin:  no lesions    Impression & Recommendations:  Problem # 1:  HOCM / IHSS (ICD-425.1)  He has had no symptoms related to this and this appears stable. His updated medication list for this problem includes:    Spironolactone 25 Mg Tabs (Spironolactone) .Marland Kitchen... Take one tablet once daily    Furosemide 20 Mg Tabs (Furosemide) .Marland Kitchen... Take two  tablets by mouth once daily    Metoprolol Succinate 100 Mg Xr24h-tab (Metoprolol succinate) .Marland Kitchen... Take 1and 1/2  tablet by mouth daily    Amlodipine Besylate 10 Mg Tabs (Amlodipine besylate) .Marland Kitchen... Take one tablet by mouth daily    Aspirin Ec 325 Mg Tbec (Aspirin) .Marland Kitchen... Take one-half  tablet by mouth daily  His updated medication list for this problem includes:    Spironolactone 25 Mg Tabs (Spironolactone) .Marland Kitchen... Take one tablet once daily    Furosemide 20 Mg Tabs (Furosemide) .Marland Kitchen... Take two tablets by mouth once daily    Metoprolol Succinate 100 Mg Xr24h-tab (Metoprolol succinate) .Marland Kitchen... Take 1and 1/2  tablet by mouth daily    Amlodipine Besylate 10 Mg Tabs (Amlodipine besylate) .Marland Kitchen... Take one tablet by mouth daily    Aspirin Ec 325 Mg Tbec (Aspirin) .Marland Kitchen... Take one-half  tablet by mouth daily  Problem # 2:  EDEMA (ICD-782.3) He has 2+ edema of both lower extremities which is somewhat worse than before. There is no evidence of phlebitis. I think this is related to venous insufficiency. We'll increase his Lasix from 20 mg to 40 mg daily. We Will get laboratory studies in one week.  Problem # 3:  ALCOHOL ABUSE (ICD-305.00) Unfortunately Dr. 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 He is still going to [employer] regularly. I discussed with him that this is the biggest threat to his long-term health and well-being. He appears to have confidence in one of his counselors from [facility] who is frequently at the [employer]. Mrs. Egbert Garibaldi.   I suggested that he contact him to see if he could give him some one-on-one counseling. He indicated he would followup and try to do this.  Other Orders: EKG w/ Interpretation (93000)  Patient Instructions: 1)  Increase lasix to 20mg  two tablets by mouth once daily. 2)  You will need to return for labwork in 10 days:  liver/bmet/cbc/bnp (425.1;272.2;402.10)-  this will be due around 10/20/10. 3)  Your physician wants  you to follow-up in:  4 months with Dr. Riley Kill. You will receive a reminder letter in the mail two months in advance. If you don't receive a letter, please call our office to schedule the follow-up appointment.

## 2010-12-11 NOTE — Progress Notes (Signed)
Summary: REFILL  Phone Note Refill Request Message from:  Pharmacy on [address]  Refills Requested: Medication #1:  ZOLPIDEM TARTRATE 10 MG  TABS 1 at bedtime as needed Initial call taken by: Lamar Sprinkles, CMA,  Mar 19, 2010 4:17 PM  Follow-up for Phone Call        no OV since '07 that I could find. Option is 1 month of meds and OV, or since he has seen Charlies Constable routinely have Dr. Juanda Chance refill. Follow-up by: Jacques Navy MD,  Mar 20, 2010 5:10 AM  Additional Follow-up for Phone Call Additional follow up Details #1::        pt wants medication filled asap Additional Follow-up by: Ami Bullins CMA,  Mar 20, 2010 1:21 PM    Additional Follow-up for Phone Call Additional follow up Details #2::    OK for 1 month # 30 no further refills without office visit.  Follow-up by: Jacques Navy MD,  Mar 20, 2010 1:52 PM  Prescriptions: ZOLPIDEM TARTRATE 10 MG  TABS (ZOLPIDEM TARTRATE) 1 at bedtime as needed  #30 x 0   Entered by:   Ami Bullins CMA   Authorized by:   Jacques Navy MD   Signed by:   Bill Salinas CMA on 03/21/2010   Method used:   Telephoned to ...       [facility] (retail)       [address]       [place], [place]  [id number]       Ph: [phone] or [phone]       Fax: [phone]   RxID:   [id number]

## 2010-12-11 NOTE — Miscellaneous (Signed)
  Clinical Lists Changes  Observations: Added new observation of ECHOINTERP: Study Conclusions            1. Left ventricle: Basal septal hypetrophy and small LVOT but no        significant gradient        and no SAM The cavity size was normal. Wall thickness was        increased in a pattern of moderate LVH. Systolic function was        normal. The estimated ejection fraction was in the range of 55%        to 65%.     2. Aortic valve: Mild regurgitation.     3. Left atrium: The atrium was mildly dilated.     4. Atrial septum: No defect or patent foramen ovale was identified.     Echocardiography. M-mode, complete 2D, spectral Doppler, and color     Doppler. Height: Height: 177.8cm. Height: 70in. Weight: Weight:     78.9kg. Weight: 173.6lb. Body mass index: BMI: 25kg/m 2. Body     surface area: BSA: 1.66m 2. Patient status: Outpatient. Location:     [place] [facility].        (07/03/2009 15:53)      Echocardiogram  Procedure date:  07/03/2009  Findings:      Study Conclusions            1. Left ventricle: Basal septal hypetrophy and small LVOT but no        significant gradient        and no SAM The cavity size was normal. Wall thickness was        increased in a pattern of moderate LVH. Systolic function was        normal. The estimated ejection fraction was in the range of 55%        to 65%.     2. Aortic valve: Mild regurgitation.     3. Left atrium: The atrium was mildly dilated.     4. Atrial septum: No defect or patent foramen ovale was identified.     Echocardiography. M-mode, complete 2D, spectral Doppler, and color     Doppler. Height: Height: 177.8cm. Height: 70in. Weight: Weight:     78.9kg. Weight: 173.6lb. Body mass index: BMI: 25kg/m 2. Body     surface area: BSA: 1.31m 2. Patient status: Outpatient. Location:     [facility].

## 2010-12-11 NOTE — Progress Notes (Signed)
  Phone Note Refill Request Message from:  Fax from Pharmacy on April 14, 2010 10:12 AM  Refills Requested: Medication #1:  ZOLPIDEM TARTRATE 10 MG  TABS 1 at bedtime as needed   Last Refilled: 03/21/2010 recieved fax from [facility]. Please Advise refills.  Initial call taken by: Ami Bullins CMA,  April 14, 2010 10:13 AM  Follow-up for Phone Call        no, he needs to be seen Follow-up by: Etta Grandchild MD,  April 14, 2010 10:18 AM  Additional Follow-up for Phone Call Additional follow up Details #1::        did not fill faxed to pharm and informed pt that appt needs to be set up Additional Follow-up by: Ami Bullins CMA,  April 14, 2010 10:23 AM    Prescriptions: ZOLPIDEM TARTRATE 10 MG  TABS (ZOLPIDEM TARTRATE) 1 at bedtime as needed  #0 x 0   Entered by:   Ami Bullins CMA   Authorized by:   Jacques Navy MD   Signed by:   Bill Salinas CMA on 04/14/2010   Method used:   Faxed to ...       [facility] (retail)       [address]       [place], [place]  [address]       Ph: [phone] or [phone]       Fax: [phone]   RxID:   [id number]

## 2010-12-11 NOTE — Progress Notes (Signed)
  Phone Note Refill Request Message from:  Fax from Pharmacy on August 28, 2010 4:57 PM  Refills Requested: Medication #1:  OMEPRAZOLE 40 MG CPDR take 1 q am Initial call taken by: Ami Bullins CMA,  August 28, 2010 4:57 PM    Prescriptions: OMEPRAZOLE 40 MG CPDR (OMEPRAZOLE) take 1 q am  #30 Capsule x 3   Entered by:   Ami Bullins CMA   Authorized by:   Jacques Navy MD   Signed by:   Bill Salinas CMA on 08/28/2010   Method used:   Electronically to        [facility]* (retail)       [address]       [place], [place]  [id number]       Ph: [phone] or [phone]       Fax: [phone]   RxID:   [id number]

## 2010-12-11 NOTE — Assessment & Plan Note (Signed)
Summary: f57m      Allergies Added: NKDA  Visit Type:  Follow-up Primary Provider:  Jacques Navy MD  CC:  no complaints.  History of Present Illness: Patient is 77 years old and return for management of hypertrophic cardiomyopathy. He has a history of hypertrophic cardiomyopathy which has not been associated with any symptoms. He also has hypertension hyperlipidemia. History of intolerance to Lipitor in the past.  He has been doing well since his last visit with no chest pain palpitations or edema.  His other major problem is the history of alcohol. He is going to [employer] regularly 3 times a week and says he has not been drinking. This is an improvement from his last visit when he was having some occasional drinking.  Current Medications (verified): 1)  Zolpidem Tartrate 10 Mg  Tabs (Zolpidem Tartrate) .Marland Kitchen.. 1 At Bedtime As Needed 2)  Omeprazole 40 Mg Cpdr (Omeprazole) .... Take 1 Q Am 3)  Spironolactone 25 Mg Tabs (Spironolactone) .... Take One Tablet Once Daily 4)  Furosemide 20 Mg Tabs (Furosemide) .... Take One Tablet Once Daily 5)  Metoprolol Succinate 100 Mg Xr24h-Tab (Metoprolol Succinate) .... Take 1and 1/2  Tablet By Mouth Daily 6)  Simvastatin 20 Mg Tabs (Simvastatin) .... Take One Tablet By Mouth Daily At Bedtime 7)  Amlodipine Besylate 10 Mg Tabs (Amlodipine Besylate) .... Take One Tablet By Mouth Daily 8)  Aspirin Ec 325 Mg Tbec (Aspirin) .... Take One-Half  Tablet By Mouth Daily  Allergies (verified): No Known Drug Allergies  Past History:  Past Medical History: Reviewed history from 06/23/2009 and no changes required.  Hyperlipidemia  Hypertension  Cirrhosis  Alcoholic pancreatitis   1. Hypertrophic cardiomyopathy, stable. 2. Venous insufficiency of the lower extremity. 3. Hypertension. 4. Hyperlipidemia with intolerance to LIPITOR. 5. History of alcohol abuse, now stable following treatment at     [facility]. 6. History of alcoholic  pancreatitis and liver disease.   Review of Systems       ROS is negative except as outlined in HPI.   Vital Signs:  Patient profile:   77 year old male Height:      70 inches Weight:      174 pounds BMI:     25.06 Pulse rate:   70 / minute BP sitting:   122 / 66  (left arm) Cuff size:   regular  Vitals Entered By: Burnett Kanaris, CNA (Mar 10, 2010 8:54 AM)  Physical Exam  Additional Exam:  Gen. Well-nourished, in no distress   Neck: No JVD, thyroid not enlarged, no carotid bruits Lungs: No tachypnea, clear without rales, rhonchi or wheezes Cardiovascular: Rhythm regular, PMI not displaced,  heart sounds  normal, no murmurs or gallops, no peripheral edema, pulses normal in all 4 extremities. Abdomen: BS normal, abdomen soft and non-tender without masses or organomegaly, no hepatosplenomegaly. MS: No deformities, no cyanosis or clubbing   Neuro:  No focal sns   Skin:  no lesions    Impression & Recommendations:  Problem # 1:  HOCM / IHSS (ICD-425.1) He has had no symptoms of chest pain or swelling or shortness of breath. The thumb appears stable. His updated medication list for this problem includes:    Spironolactone 25 Mg Tabs (Spironolactone) .Marland Kitchen... Take one tablet once daily    Furosemide 20 Mg Tabs (Furosemide) .Marland Kitchen... Take one tablet once daily    Metoprolol Succinate 100 Mg Xr24h-tab (Metoprolol succinate) .Marland Kitchen... Take 1and 1/2  tablet by mouth daily  Amlodipine Besylate 10 Mg Tabs (Amlodipine besylate) .Marland Kitchen... Take one tablet by mouth daily    Aspirin Ec 325 Mg Tbec (Aspirin) .Marland Kitchen... Take one-half  tablet by mouth daily  Orders: EKG w/ Interpretation (93000)  Problem # 2:  HYPERTENSION, BENIGN (ICD-401.1) This is well controlled on current medications. His updated medication list for this problem includes:    Spironolactone 25 Mg Tabs (Spironolactone) .Marland Kitchen... Take one tablet once daily    Furosemide 20 Mg Tabs (Furosemide) .Marland Kitchen... Take one tablet once daily    Metoprolol  Succinate 100 Mg Xr24h-tab (Metoprolol succinate) .Marland Kitchen... Take 1and 1/2  tablet by mouth daily    Amlodipine Besylate 10 Mg Tabs (Amlodipine besylate) .Marland Kitchen... Take one tablet by mouth daily    Aspirin Ec 325 Mg Tbec (Aspirin) .Marland Kitchen... Take one-half  tablet by mouth daily  Orders: EKG w/ Interpretation (93000)  Problem # 3:  HYPERLIPIDEMIA-MIXED (ICD-272.4) We will get a lipid and liver profile prior to his next visit in 4 months. His updated medication list for this problem includes:    Simvastatin 20 Mg Tabs (Simvastatin) .Marland Kitchen... Take one tablet by mouth daily at bedtime  Orders: EKG w/ Interpretation (93000)  Problem # 4:  ALCOHOL ABUSE (ICD-305.00) He is attending AA meetings regularly and indicates he is not been drinking.  Patient Instructions: 1)  Your physician recommends that you schedule a follow-up appointment in: 4 months. 2)  Your physician recommends that you return for FASTING lab work in: 4 months- just prior to your followup appointment with Dr. Juanda Chance- lipid/liver/tsh/cbc/bmet (425.1;402.10;272.2)

## 2010-12-11 NOTE — Letter (Signed)
Summary: Generic Letter  [facility], Main Office  [address]   [place], [place] [address]   Phone: [phone]  Fax: [phone]        November 05, 2010 MRN: [id number]    [facility] [address] [place], [place]  [address]    Dear [name],  This is just a reminder that you are overdue for repeat labwork ordered by Dr. Juanda Chance at your last office visit. This was due around 10/20/10. Please stop by our office at your earliest convenience to have this done. Our lab hours are Monday- Friday from 8:30am to 2:00pm and 2:30pm to 4:30pm. You do not have to fast for this blood work.          Sincerely,  Sherri Rad, RN, BSN  This letter has been electronically signed by your physician.

## 2010-12-11 NOTE — Progress Notes (Signed)
  Phone Note Refill Request Message from:  Fax from Pharmacy on January 14, 2010 3:37 PM  Refills Requested: Medication #1:  OMEPRAZOLE 40 MG CPDR take 1 q am Initial call taken by: Ami Bullins CMA,  January 14, 2010 3:37 PM    Prescriptions: OMEPRAZOLE 40 MG CPDR (OMEPRAZOLE) take 1 q am  #30 Capsule x 3   Entered by:   Ami Bullins CMA   Authorized by:   Jacques Navy MD   Signed by:   Bill Salinas CMA on 01/14/2010   Method used:   Faxed to ...       [facility] (retail)       [address]       [place], [place]  [id number]       Ph: [phone] or [phone]       Fax: [phone]   RxID:   [id number]

## 2010-12-11 NOTE — Progress Notes (Signed)
Summary: ambien refill  Phone Note Refill Request Message from:  Patient on April 14, 2010 1:55 PM  Refills Requested: Medication #1:  ZOLPIDEM TARTRATE 10 MG  TABS 1 at bedtime as needed Pt wants a refill on this medication. He states he is not coming in for an office visit just to get refills approved on medication. I will send this to Dr Juanda Chance to see if refill want to be taken over by MD.   Initial call taken by: Ami Bullins CMA,  April 14, 2010 1:56 PM    Prescriptions: ZOLPIDEM TARTRATE 10 MG  TABS (ZOLPIDEM TARTRATE) 1 at bedtime as needed  #30 x 2   Entered by:   Sherri Rad, RN, BSN   Authorized by:   Lenoria Farrier, MD, [facility]   Signed by:   Sherri Rad, RN, BSN on 04/15/2010   Method used:   Telephoned to ...       [facility] (retail)       [address]       [place], [place]  [id number]       Ph: [phone] or [phone]       Fax: [phone]   RxID:   [id number]

## 2010-12-11 NOTE — Assessment & Plan Note (Signed)
Summary: 3 month rov      Allergies Added: NKDA  Primary Provider:  Jacques Navy MD  CC:  no chest.  History of Present Illness: The patient is 77 years old and return for management of hypertension and hypertrophic cardio myopathy. He's had no recent symptoms of chest pain shortness of breath or palpitations.  He also has a problem with alcohol. He says he's been going to [employer] 4 times a week. He is drinking some and is trying to quit entirely. He says when he drinks he usually goes to a bar alone.  Current Medications (verified): 1)  Zolpidem Tartrate 10 Mg  Tabs (Zolpidem Tartrate) .Marland Kitchen.. 1 At Bedtime As Needed 2)  Omeprazole 40 Mg Cpdr (Omeprazole) .... Take 1 Q Am 3)  Spironolactone 25 Mg Tabs (Spironolactone) .... Take One Tablet Once Daily 4)  Furosemide 20 Mg Tabs (Furosemide) .... Take One Tablet Once Daily 5)  Metoprolol Succinate 100 Mg Xr24h-Tab (Metoprolol Succinate) .... Take 1and 1/2  Tablet By Mouth Daily 6)  Simvastatin 20 Mg Tabs (Simvastatin) .... Take One Tablet By Mouth Daily At Bedtime 7)  Amlodipine Besylate 10 Mg Tabs (Amlodipine Besylate) .... Take One Tablet By Mouth Daily 8)  Aspirin Ec 325 Mg Tbec (Aspirin) .... Take One-Half  Tablet By Mouth Daily  Allergies (verified): No Known Drug Allergies  Past History:  Past Medical History: Reviewed history from 06/23/2009 and no changes required.  Hyperlipidemia  Hypertension  Cirrhosis  Alcoholic pancreatitis   1. Hypertrophic cardiomyopathy, stable. 2. Venous insufficiency of the lower extremity. 3. Hypertension. 4. Hyperlipidemia with intolerance to LIPITOR. 5. History of alcohol abuse, now stable following treatment at     [facility]. 6. History of alcoholic pancreatitis and liver disease.   Review of Systems       ROS is negative except as outlined in HPI.   Vital Signs:  Patient profile:   77 year old male Height:      70 inches Weight:      178 pounds Pulse rate:   86 /  minute BP sitting:   120 / 67  (left arm) Cuff size:   regular  Vitals Entered By: Burnett Kanaris, CNA (December 04, 2009 8:45 AM)  Physical Exam  Additional Exam:  Gen. Well-nourished, in no distress   Neck: No JVD, thyroid not enlarged, no carotid bruits Lungs: No tachypnea, clear without rales, rhonchi or wheezes Cardiovascular: Rhythm regular, PMI not displaced,  heart sounds  normal, grade 2/6 systolic ejection murmur, trace peripheral edema, pulses normal in all 4 extremities. Abdomen: BS normal, abdomen soft and non-tender without masses or organomegaly, no hepatosplenomegaly. MS: No deformities, no cyanosis or clubbing   Neuro:  No focal sns   Skin:  no lesions    Impression & Recommendations:  Problem # 1:  HOCM / IHSS (ICD-425.1) He has had no symptoms of chest pain or shortness of breath. This problem appears stable. His updated medication list for this problem includes:    Spironolactone 25 Mg Tabs (Spironolactone) .Marland Kitchen... Take one tablet once daily    Furosemide 20 Mg Tabs (Furosemide) .Marland Kitchen... Take one tablet once daily    Metoprolol Succinate 100 Mg Xr24h-tab (Metoprolol succinate) .Marland Kitchen... Take 1and 1/2  tablet by mouth daily    Amlodipine Besylate 10 Mg Tabs (Amlodipine besylate) .Marland Kitchen... Take one tablet by mouth daily    Aspirin Ec 325 Mg Tbec (Aspirin) .Marland Kitchen... Take one-half  tablet by mouth daily  Orders: EKG w/ Interpretation (93000)  Problem # 2:  AAA (ICD-441.4) His last ultrasound showed ectasia with no true aneurysm. I do not think further followup will be needed.  Problem # 3:  ALCOHOL ABUSE (ICD-305.00) This is the major problem. Unfortunately he is still drinking some.  I encouraged him to work on total cessation. He is going to AA regularly 4 times a week.  Patient Instructions: 1)  Your physician recommends that you schedule a follow-up appointment in: 4 months. 2)  Your physician recommends that you continue on your current medications as directed. Please refer  to the Current Medication list given to you today.

## 2010-12-11 NOTE — Progress Notes (Signed)
Summary: refill meds   Phone Note Refill Request Call back at Home Phone [phone] Message from:  Patient on October 29, 2010 3:05 PM  Refills Requested: Medication #1:  ZOLPIDEM TARTRATE 10 MG  TABS 1 at bedtime as needed [facility] - need to be pick up today   Method Requested: Fax to Local Pharmacy Initial call taken by: Lorne Skeens,  October 29, 2010 3:05 PM  Follow-up for Phone Call        PT [facility] REGARDING HIS REFILL NEED IT TODAY Judie Grieve  October 29, 2010 4:49 PM  dr Rica Records calling back wanting med called in asap-said he's been waiting since last week for refill Glynda Jaeger  October 30, 2010 1:34 PM   Additional Follow-up for Phone Call Additional follow up Details #1::        RX sent into pharmacy. [facility] for pt Marrion Coy, CNA  October 31, 2010 11:29 AM  Additional Follow-up by: Marrion Coy, CNA,  October 31, 2010 11:29 AM    Prescriptions: ZOLPIDEM TARTRATE 10 MG  TABS (ZOLPIDEM TARTRATE) 1 at bedtime as needed  #30 x 2   Entered by:   Marrion Coy, CNA   Authorized by:   Lenoria Farrier, MD, [facility]   Signed by:   Marrion Coy, CNA on 10/31/2010   Method used:   Historical   RxID:   [id number]

## 2010-12-11 NOTE — Progress Notes (Signed)
Summary: Refill--Omeprazole  Phone Note Refill Request Message from:  Fax from Pharmacy on May 07, 2010 11:31 AM  Refills Requested: Medication #1:  OMEPRAZOLE 40 MG CPDR take 1 q am Next Appointment Scheduled: none Initial call taken by: Lucious Groves,  May 07, 2010 11:31 AM    Prescriptions: OMEPRAZOLE 40 MG CPDR (OMEPRAZOLE) take 1 q am  #30 Capsule x 3   Entered by:   Lucious Groves   Authorized by:   Jacques Navy MD   Signed by:   Lucious Groves on 05/07/2010   Method used:   Faxed to ...       [facility] (retail)       [address]       [place], [place]  [id number]       Ph: [phone] or [phone]       Fax: [phone]   RxID:   [id number]

## 2010-12-11 NOTE — Progress Notes (Signed)
Summary: [name] refill   ---- Converted from flag ---- ---- 04/14/2010 10:41 PM, Lenoria Farrier, MD, [facility] wrote: Herbert Seta, [place] to refill meds. BB ------------------------------  Phone Note Outgoing Call   Call placed by: Sherri Rad, RN, BSN,  April 15, 2010 9:53 AM Summary of Call: I called the pt and made him aware that Dr. Juanda Chance stated he would take take over his ambien refill. I have called this in to [facility]. Initial call taken by: Sherri Rad, RN, BSN,  April 15, 2010 9:54 AM

## 2011-01-21 ENCOUNTER — Telehealth: Payer: Self-pay | Admitting: Internal Medicine

## 2011-01-27 NOTE — Progress Notes (Signed)
Summary: rx refill    Phone Note Refill Request Message from:  Pharmacy on January 21, 2011 9:09 AM  Refills Requested: Medication #1:  ZOLPIDEM TARTRATE 10 MG  TABS 1 at bedtime as needed  Method Requested: Telephone to Pharmacy Initial call taken by: Roe Coombs,  January 21, 2011 9:09 AM  Follow-up for Phone Call        forward to Dr Debby Bud office -- he is his PCP Follow-up by: Hardin Negus, RMA,  January 21, 2011 10:30 AM  Additional Follow-up for Phone Call Additional follow up Details #1::        ok for refill as needed  Additional Follow-up by: Jacques Navy MD,  January 21, 2011 4:21 PM    Additional Follow-up for Phone Call Additional follow up Details #2::    Tried to call pharmacy but no answer. Will call back tomorrow morning for Rx refill authorization. Follow-up by: Burnard Leigh [facility]),  January 21, 2011 6:24 PM  Prescriptions: ZOLPIDEM TARTRATE 10 MG  TABS (ZOLPIDEM TARTRATE) 1 at bedtime as needed  #30 x 2   Entered by:   Burnard Leigh [facility])   Authorized by:   Jacques Navy MD   Signed by:   Burnard Leigh [facility]) on 01/22/2011   Method used:   Telephoned to ...       [facility] (retail)       [address]       [place], [place]  [id number]       Ph: [phone] or [phone]       Fax: [phone]   RxID:   [id number]   Appended Document: rx refill  Pt informed.    Clinical Lists Changes

## 2011-02-08 HISTORY — PX: MELANOMA EXCISION: SHX5266

## 2011-02-09 ENCOUNTER — Other Ambulatory Visit: Payer: Self-pay | Admitting: Dermatology

## 2011-02-14 ENCOUNTER — Emergency Department (HOSPITAL_COMMUNITY)
Admission: EM | Admit: 2011-02-14 | Discharge: 2011-02-15 | Disposition: A | Discharge status: Home / Self Care | Payer: Medicare Other | Attending: Emergency Medicine | Admitting: Emergency Medicine

## 2011-02-14 ENCOUNTER — Emergency Department (HOSPITAL_COMMUNITY): Payer: Medicare Other

## 2011-02-14 DIAGNOSIS — I440 Atrioventricular block, first degree: Secondary | ICD-10-CM | POA: Insufficient documentation

## 2011-02-14 DIAGNOSIS — I10 Essential (primary) hypertension: Secondary | ICD-10-CM | POA: Insufficient documentation

## 2011-02-14 DIAGNOSIS — F1010 Alcohol abuse, uncomplicated: Secondary | ICD-10-CM | POA: Insufficient documentation

## 2011-02-14 LAB — CBC
HCT: 43.8 % (ref 39.0–52.0)
Hemoglobin: 15.1 g/dL (ref 13.0–17.0)
MCH: 32.4 pg (ref 26.0–34.0)
MCHC: 34.5 g/dL (ref 30.0–36.0)
RDW: 12.5 % (ref 11.5–15.5)

## 2011-02-14 LAB — GLUCOSE, CAPILLARY: Glucose-Capillary: 153 mg/dL — ABNORMAL HIGH (ref 70–99)

## 2011-02-14 LAB — BASIC METABOLIC PANEL
BUN: 37 mg/dL — ABNORMAL HIGH (ref 6–23)
CO2: 25 mEq/L (ref 19–32)
Calcium: 8.8 mg/dL (ref 8.4–10.5)
Chloride: 105 mEq/L (ref 96–112)
Creatinine, Ser: 1.57 mg/dL — ABNORMAL HIGH (ref 0.4–1.5)
Glucose, Bld: 157 mg/dL — ABNORMAL HIGH (ref 70–99)

## 2011-02-15 LAB — DIFFERENTIAL
Basophils Absolute: 0.1 10*3/uL (ref 0.0–0.1)
Basophils Relative: 1 % (ref 0–1)
Eosinophils Absolute: 0.5 10*3/uL (ref 0.0–0.7)
Eosinophils Relative: 5 % (ref 0–5)
Lymphocytes Relative: 20 % (ref 12–46)
Lymphs Abs: 1.9 10*3/uL (ref 0.7–4.0)
Monocytes Absolute: 0.8 10*3/uL (ref 0.1–1.0)
Monocytes Relative: 8 % (ref 3–12)
Neutro Abs: 6.4 10*3/uL (ref 1.7–7.7)
Neutrophils Relative %: 66 % (ref 43–77)

## 2011-02-16 ENCOUNTER — Other Ambulatory Visit: Payer: Self-pay | Admitting: *Deleted

## 2011-02-16 MED ORDER — FUROSEMIDE 20 MG PO TABS: 40.0000 mg | ORAL_TABLET | Freq: Every day | ORAL | Status: DC

## 2011-02-26 ENCOUNTER — Encounter: Payer: Self-pay | Admitting: Physician Assistant

## 2011-02-26 ENCOUNTER — Ambulatory Visit (INDEPENDENT_AMBULATORY_CARE_PROVIDER_SITE_OTHER): Payer: Medicare Other | Admitting: Physician Assistant

## 2011-02-26 VITALS — BP 124/64 | HR 78 | Ht 72.0 in | Wt 181.0 lb

## 2011-02-26 DIAGNOSIS — Z01810 Encounter for preprocedural cardiovascular examination: Secondary | ICD-10-CM

## 2011-02-26 DIAGNOSIS — I421 Obstructive hypertrophic cardiomyopathy: Secondary | ICD-10-CM

## 2011-02-26 DIAGNOSIS — E785 Hyperlipidemia, unspecified: Secondary | ICD-10-CM

## 2011-02-26 NOTE — Assessment & Plan Note (Signed)
Get echo as noted.  He does not have any unstable cardiac conditions and he is able to achieve > 4 METS without angina.  Other than his echo, he will not need any further cardiac workup prior to his noncardiac surgery.

## 2011-02-26 NOTE — Patient Instructions (Signed)
Your physician recommends that you schedule a follow-up appointment in: 3 months with Dr. Riley Kill.  Your physician recommends that you return for lab work in: 03/03/11 for fasting cmet and lipid panel.  Your physician has requested that you have an echocardiogram 425.11. Echocardiography is a painless test that uses sound waves to create images of your heart. It provides your doctor with information about the size and shape of your heart and how well your heart's chambers and valves are working. This procedure takes approximately one hour. There are no restrictions for this procedure.   Your physician recommends that you continue on your current medications as directed. Please refer to the Current Medication list given to you today.

## 2011-02-26 NOTE — Assessment & Plan Note (Signed)
Will arrange CMET and Lipids.

## 2011-02-26 NOTE — Assessment & Plan Note (Addendum)
It has been 1 1/2 years since his last echo.  Will repeat this ASAP prior to clearing him for surgery.  Close attention will need to be paid to his fluid status in the perioperative period.  Our service will be available as necessary.    03/04/11: Reviewed echo from 03/03/11.  LVF is normal.  Hypertrophy is stable.  Gradient is small and stable.  He has moderate AI and very mild AS.  He does not require any further testing prior to his noncardiac surgery. He should be at acceptable risk.

## 2011-02-26 NOTE — Progress Notes (Signed)
History of Present Illness: Primary Cardiologist:  Dr.  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  He was previously followed by Dr. Juanda Chance and will follow up with Dr. Riley Kill in the future.  He has been diagnosed with malignant melanoma and needs excision.  He will be placed under general anesthesia.  He presents for surgical clearance.  He denies any chest pain. He has some mild DOE.  He describes NYHA 2 symptoms.  He denies syncope or palpitations.  He is able to achieve 4 METS or greater without chest pain or dyspnea.  He denies orthopnea or PND.  No significant change in his pedal edema.  He is still drinking etoh a few times a week but is going to AA.    Past Medical History  Diagnosis Date  . Hypertrophic cardiomyopathy     echo 8/10: EF 55-65%, basal septal hypertrophy and small LVOT but no sig. gradient and no SAM, mod LVH, mild AI, mild LAE (IVS ED 13.6 mm)  . HTN (hypertension)   . HLD (hyperlipidemia)   . Alcoholic cirrhosis   . GERD (gastroesophageal reflux disease)   . Renal insufficiency   . Alcoholism    Past Surgical History  Procedure Date  . Inguinal hernia repair      Current Outpatient Prescriptions  Medication Sig Dispense Refill  . amLODipine (NORVASC) 10 MG tablet Take 10 mg by mouth daily.        Marland Kitchen aspirin 325 MG EC tablet Take 162.5 mg by mouth daily.        . metoprolol (TOPROL-XL) 100 MG 24 hr tablet Take 150 mg by mouth daily.        Marland Kitchen omeprazole (PRILOSEC) 40 MG capsule Take 40 mg by mouth daily.        . simvastatin (ZOCOR) 20 MG tablet Take 20 mg by mouth at bedtime.        Marland Kitchen spironolactone (ALDACTONE) 25 MG tablet Take 25 mg by mouth daily.        Marland Kitchen zolpidem (AMBIEN) 10 MG tablet Take 10 mg by mouth at bedtime as needed.        . furosemide (LASIX) 20 MG tablet Take 2 tablets (40 mg total) by mouth daily.  90 tablet  3    No Known Allergies  History  Substance Use  Topics  . Smoking status: Never Smoker   . Smokeless tobacco: Not on file  . Alcohol Use: 1.5 - 2.0 oz/week    3-4 drink(s) per week     member of AA    Family History  Problem Relation Age of Onset  . Colon cancer Father     ROS:  Please see HPI.  No fevers, chills, cough, melena or hematochezia.  All other systems reviewed and negative.  Vital Signs: BP 124/64  Pulse 78  Ht 6' (1.829 m)  Wt 181 lb (82.101 kg)  BMI 24.55 kg/m2  PHYSICAL EXAM: Well nourished, well developed, in no acute distress HEENT: normal Neck: no JVD Endocrine: no thyromegaly Vascular: no carotid bruits Cardiac:  normal S1, S2; RRR; 2/6 systolic murmur with increased intensity with valsalva  Lungs:  clear to auscultation bilaterally, no wheezing, rhonchi or rales Abd: soft, nontender, no hepatomegaly Ext: trace to 1+ bilateral ankle edema Skin: warm and dry Neuro:  CNs 2-12 intact, no focal abnormalities noted Psych: normal affect  EKG:  NSR, HR 78, normal axis, RBBB, TW  inversions in 2, 3, aVF, V2-4; no significant change since prior tracing.  ASSESSMENT AND PLAN:

## 2011-02-27 ENCOUNTER — Telehealth: Payer: Self-pay | Admitting: Cardiology

## 2011-02-27 ENCOUNTER — Other Ambulatory Visit (HOSPITAL_COMMUNITY): Payer: Self-pay | Admitting: General Surgery

## 2011-02-27 DIAGNOSIS — C439 Malignant melanoma of skin, unspecified: Secondary | ICD-10-CM

## 2011-02-27 NOTE — Telephone Encounter (Signed)
I spoke with [name] and made her aware that the pt has not been cleared at this time. The pt has a pending echocardiogram on 03/03/11.

## 2011-02-27 NOTE — Telephone Encounter (Signed)
Echo is pending next week.  If echo stable, he will be cleared.

## 2011-02-27 NOTE — Telephone Encounter (Signed)
I will forward this message to Tereso Newcomer PA-C and Danielle Rankin to follow-up with pt.  The pt was evaluated by Lorin Picket in the office on 02/26/11.

## 2011-03-03 ENCOUNTER — Other Ambulatory Visit: Payer: Medicare Other | Admitting: *Deleted

## 2011-03-03 ENCOUNTER — Telehealth: Payer: Self-pay | Admitting: *Deleted

## 2011-03-03 ENCOUNTER — Other Ambulatory Visit (INDEPENDENT_AMBULATORY_CARE_PROVIDER_SITE_OTHER): Payer: Medicare Other | Admitting: *Deleted

## 2011-03-03 ENCOUNTER — Ambulatory Visit (HOSPITAL_COMMUNITY): Payer: Medicare Other | Attending: Internal Medicine

## 2011-03-03 DIAGNOSIS — E785 Hyperlipidemia, unspecified: Secondary | ICD-10-CM

## 2011-03-03 DIAGNOSIS — I421 Obstructive hypertrophic cardiomyopathy: Secondary | ICD-10-CM

## 2011-03-03 DIAGNOSIS — I079 Rheumatic tricuspid valve disease, unspecified: Secondary | ICD-10-CM | POA: Insufficient documentation

## 2011-03-03 DIAGNOSIS — Z01810 Encounter for preprocedural cardiovascular examination: Secondary | ICD-10-CM

## 2011-03-03 DIAGNOSIS — I359 Nonrheumatic aortic valve disorder, unspecified: Secondary | ICD-10-CM

## 2011-03-03 DIAGNOSIS — I080 Rheumatic disorders of both mitral and aortic valves: Secondary | ICD-10-CM | POA: Insufficient documentation

## 2011-03-03 DIAGNOSIS — I379 Nonrheumatic pulmonary valve disorder, unspecified: Secondary | ICD-10-CM | POA: Insufficient documentation

## 2011-03-03 LAB — COMPREHENSIVE METABOLIC PANEL
Alkaline Phosphatase: 81 U/L (ref 39–117)
BUN: 28 mg/dL — ABNORMAL HIGH (ref 6–23)
CO2: 24 mEq/L (ref 19–32)
Creatinine, Ser: 1.7 mg/dL — ABNORMAL HIGH (ref 0.4–1.5)
GFR: 42.6 mL/min — ABNORMAL LOW (ref >60.00)
Glucose, Bld: 124 mg/dL — ABNORMAL HIGH (ref 70–99)
Sodium: 136 mEq/L (ref 135–145)
Total Bilirubin: 0.7 mg/dL (ref 0.3–1.2)
Total Protein: 7.2 g/dL (ref 6.0–8.3)

## 2011-03-03 LAB — LIPID PANEL
Cholesterol: 152 mg/dL (ref 0–200)
Triglycerides: 117 mg/dL (ref 0.0–149.0)
VLDL: 23.4 mg/dL (ref 0.0–40.0)

## 2011-03-03 NOTE — Telephone Encounter (Signed)
See phone note

## 2011-03-04 ENCOUNTER — Encounter: Payer: Self-pay | Admitting: Physician Assistant

## 2011-03-05 ENCOUNTER — Encounter (HOSPITAL_COMMUNITY)
Admission: RE | Admit: 2011-03-05 | Discharge: 2011-03-05 | Disposition: A | Discharge status: Home / Self Care | Payer: Medicare Other | Source: Ambulatory Visit | Attending: General Surgery | Admitting: General Surgery

## 2011-03-05 ENCOUNTER — Telehealth: Payer: Self-pay | Admitting: Physician Assistant

## 2011-03-05 LAB — CBC
HCT: 43 % (ref 39.0–52.0)
Hemoglobin: 15.2 g/dL (ref 13.0–17.0)
RBC: 4.66 MIL/uL (ref 4.22–5.81)
RDW: 12.5 % (ref 11.5–15.5)

## 2011-03-05 LAB — PROTIME-INR
INR: 1 (ref 0.00–1.49)
Prothrombin Time: 13.4 seconds (ref 11.6–15.2)

## 2011-03-05 LAB — COMPREHENSIVE METABOLIC PANEL
Albumin: 4.1 g/dL (ref 3.5–5.2)
Alkaline Phosphatase: 90 U/L (ref 39–117)
BUN: 27 mg/dL — ABNORMAL HIGH (ref 6–23)
Calcium: 9.3 mg/dL (ref 8.4–10.5)
Potassium: 4.5 mEq/L (ref 3.5–5.1)
Total Protein: 7.6 g/dL (ref 6.0–8.3)

## 2011-03-05 LAB — SURGICAL PCR SCREEN
MRSA, PCR: NEGATIVE
Staphylococcus aureus: POSITIVE — AB

## 2011-03-05 LAB — APTT: aPTT: 27 seconds (ref 24–37)

## 2011-03-05 NOTE — Telephone Encounter (Signed)
LOV,Stress Faxed to [facility] @ [phone] 03/05/11/km

## 2011-03-09 ENCOUNTER — Ambulatory Visit (HOSPITAL_COMMUNITY)
Admission: RE | Admit: 2011-03-09 | Discharge: 2011-03-09 | Disposition: A | Discharge status: Home / Self Care | Payer: Medicare Other | Source: Ambulatory Visit | Attending: General Surgery | Admitting: General Surgery

## 2011-03-09 ENCOUNTER — Other Ambulatory Visit: Payer: Self-pay | Admitting: General Surgery

## 2011-03-09 DIAGNOSIS — C439 Malignant melanoma of skin, unspecified: Secondary | ICD-10-CM

## 2011-03-09 DIAGNOSIS — C4370 Malignant melanoma of unspecified lower limb, including hip: Secondary | ICD-10-CM | POA: Insufficient documentation

## 2011-03-09 DIAGNOSIS — I10 Essential (primary) hypertension: Secondary | ICD-10-CM | POA: Insufficient documentation

## 2011-03-09 DIAGNOSIS — Z01812 Encounter for preprocedural laboratory examination: Secondary | ICD-10-CM | POA: Insufficient documentation

## 2011-03-09 LAB — GLUCOSE, CAPILLARY: Glucose-Capillary: 162 mg/dL — ABNORMAL HIGH (ref 70–99)

## 2011-03-09 MED ORDER — TECHNETIUM TC 99M SULFUR COLLOID FILTERED
0.5000 | Freq: Once | INTRAVENOUS | Status: AC | PRN
  Administered 2011-03-09: 0.5 via INTRADERMAL

## 2011-03-11 ENCOUNTER — Ambulatory Visit: Payer: Self-pay | Admitting: Cardiology

## 2011-03-13 NOTE — Op Note (Signed)
[name], [name]             ACCOUNT NO.:  [id number]  MEDICAL RECORD NO.:  [id number]           PATIENT TYPE:  O  LOCATION:  [facility]                         FACILITY:  [facility]  PHYSICIAN:  Gabrielle Dare. Janee Morn, M.D.DATE OF BIRTH:  Sep 15, 1929  DATE OF PROCEDURE:  03/09/2011 DATE OF DISCHARGE:  03/09/2011                              OPERATIVE REPORT   PREOPERATIVE DIAGNOSIS:  Melanoma, left anterior thigh.  POSTOPERATIVE DIAGNOSIS:  Melanoma, left anterior thigh.  PROCEDURE: 1. Left inguinal sentinel lymph node biopsy with blue dye injection 2. Wide excision melanoma, left anterior thigh, a 11 x 6 cm with     layered closure.  SURGEON:  Gabrielle Dare. Janee Morn, MD  ANESTHESIA:  General with laryngeal mask airway.  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  The area was biopsied by Dr. Yetta Barre demonstrating thickness of 3.5 mm.  Margins mostly appeared to be lateral.  PROCEDURE IN DETAIL:  Informed consent was obtained.  The patient was identified in the preop holding area.  His site was marked.  He received radionucleotide injection by Nuclear Medicine about 1 hour prior to surgery time.  The patient was taken to the operating room and general anesthesia with laryngeal mask airway was administered by the anesthesia staff.  Next an intradermal injection of 1-3 diluted methylene blue was placed around the biopsy site in his left anterior thigh.  This was then massaged and placed for a couple minutes.  His left thigh and groin were then prepped and draped in sterile fashion.  Please note we did a time- out procedure prior to injection of the blue dye.  Next, attention was first directed to the omentum biopsy.  The NeoProbe was used in the groin to identify the site of high reading.  A small transverse incision was made after injecting some local anesthetic.  Subcutaneous  tissues were dissected down and using the NeoProbe with guidance, I identified a hot node with notable blue staining.  This was circumferentially dissected with the lymphatics controlled with cautery.  The node was removed in one-piece.  It was sent to pathology as hot blue node.  The NeoProbe was then used to wind the remainder of the groin and there was no other elevated signal whatsoever.  The wound was copiously irrigated. The area was cauterized, good excellent hemostasis and control of the lymphatics.  The wound was then closed in layers with subcutaneous tissues closed with a running 3-0 Vicryl suture and the skin closed with running 4-0 Monocryl subcuticular stitch followed by Dermabond.  Next, attention was directed to the wide excision of the melanoma in the area giving at least about a 2.5 cm margin all the way around.  The biopsy site was measured and elliptical incision was formed in order to follow tissue planes and allow closure.  Local anesthetic was injected along the planned line of incision.  The elliptical incision was made. Subcutaneous tissues were dissected all the way down to the fascia removing the skin and subcutaneous fat.  The ellipse  was circumferentially dissected out achieving hemostasis with cautery.  Next with elliptical wide excision was marked for anatomical orientation with the sutures and sent to pathology.  I changed my gloves and did not use the same instruments.  Flaps above the fascia were raised superiorly and inferiorly with cautery.  Cautery was then used again to get good hemostasis.  Some additional local anesthetic was injected.  The wound was then closed with subcutaneous tissues approximated with interrupted 2-0 Vicryl and the skin closed with interrupted 2-0 nylon in a simple and mattress fashion.  The wound came together without significant tension and there was excellent hemostasis.  Sponge, needle, and instrument counts were all  correct.  This completed the procedure. Sterile dressing was applied.  The patient tolerated the procedure well without apparent complication and was taken to recovery in stable condition.     Gabrielle Dare Janee Morn, M.D.     BET/MEDQ  D:  03/09/2011  T:  03/09/2011  Job:  [id number]  cc:   [name], MD, [facility] [name], M.D. [name], M.D. [name]  Electronically Signed by Violeta Gelinas M.D. on 03/13/2011 10:47:26 AM

## 2011-03-24 NOTE — Assessment & Plan Note (Signed)
[facility]                            CARDIOLOGY OFFICE NOTE   NAME:[name], [name]                    MRN:          [id number]  DATE:07/19/2007                            DOB:          1929/09/22    PRIMARY CARE PHYSICIAN:  Dr. Illene [name].   GASTROENTEROLOGIST:  Dr. Sheryn [name].   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 He has a hypertrophic cardiomyopathy and has  been followed by Dr. Corinda [name] over the years and is seeing me for the  first time today.  He says he has had no symptoms of chest pain or  palpitations. He does get short of breath with exertion but he does not  think this has changed over the years.   PAST MEDICAL HISTORY:  Significant for chronic alcohol liver disease  with portal hypertension. He has also had borderline renal insufficiency  which was improved with the discontinuation of Avapro and reduction in  Lasix. He also has a history of hypertension. He has a history of an  abdominal aneurysm which was extremely small at 2.5 x 2.5 and he had a  recent ultrasound which showed no definite aneurysm.   CURRENT MEDICATIONS:  Include: Aciphex, Toprol, multivitamins, Lasix,  aspirin, Benadryl, simvastatin, and Norvasc.   PHYSICAL EXAMINATION:  Today the blood pressure was 130/70 and the pulse  was 82 and regular.  There was no venous distention. The carotid pulses were full and I could  hear no bruits.  CHEST: Clear without rales or rhonchi.  CARDIAC: Rhythm was regular. There is a grade 2 to 3/6 systolic ejection  murmur at the left sternal edge. I could hear no diastolic murmur.  ABDOMEN: Soft with normal bowel sounds. I could not feel any  hepatosplenomegaly.  The peripheral pulses were full and there was 1 + edema in the right  lower extremity and trace edema in left lower extremity.   An electrocardiogram showed right bundle branch block and nonspecific  ST-  T change.   Recent echogram showed LVH with near cavity obliteration but no SEM and  no definite outflow gradient which had been previous present.   IMPRESSION:  1. Hypertrophic obstructive cardiomyopathy, stable.  2. Hypertension, under good control.  3. Hyperlipidemia, treated.  4. Alcoholic liver disease with some cirrhosis and portal      hypertension.   RECOMMENDATIONS:  I think [name] is doing well from cardiac standpoint.  Unfortunately he is still drinking. I told him I thought his cardiac  situation was stable and the biggest risk to him was his drinking and  the alcoholic disease and encouraged him to seek help through Dr. Debby [name]  and Dr. Jarold [name] to work toward reducing his alcohol consumption. He  had tried AA in the past but did not relate very well to the group  sessions. His hemoglobin A1C has been elevated and we will get a fasting  blood sugar,  BMET, CBC, and hemoglobin A1C, and he is to schedule  follow up with Dr. Debby [name] to address these issues. I will plan  to see  him back for a cardiac follow up in 6 months.     [name] Elvera Lennox Juanda Chance, MD, [facility]  Electronically Signed    BRB/MedQ  DD: 07/19/2007  DT: 07/19/2007  Job #: [id number]   cc:   [name] Gess. Norins, MD  [name] Rea. [name] Motto, MD, [name], FAGA

## 2011-03-24 NOTE — Assessment & Plan Note (Signed)
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   He has had alcoholic chronic liver disease with portal hypertension and  renal insufficiency being followed by Dr. Jarold [name]. The renal  insufficiency improved with discontinuation of Avapro and decrease in  the Lasix.   The most recent BUN in September is 14and creatinine 1.7. He did have an  elevated hemoglobin A1c. He has not had repeat lab work done since that  time. The patient recently returned from a 2 week trip to [place]. He was  quite fatigued and found it somewhat difficult to walk. He has no  specific cardiac symptoms and no cerebral symptoms.   MEDICATIONS:  1. AcipHex.  2. Toprol XL 150.  3. Multivitamin.  4. Lasix 20.  5. Aspirin 162.  6. Benadryl.  7. Simvastatin 20.  8. Norvasc 5.   He has had an abdominal aneurysm 2.5 x 2.5 done most recently December  2006 and this should be repeated.   PHYSICAL EXAMINATION:  VITAL SIGNS:  Blood pressure 159/74, pulse 83,  normal sinus rhythm.  GENERAL APPEARANCE:  Stable.  NECK:  JVP is not elevated.  LUNGS:  Clear.  CARDIAC:  Reveals a 2/6 systolic ejection murmur in the aortic area and  apex, slightly lateral at the aortic area. There is a split second sound  consistent with right bundle branch block. The systolic murmur does not  change with Valsalva or actually decreases slightly.  ABDOMEN:  Unremarkable.  EXTREMITIES:  Reveal trace edema.   IMPRESSION:  Diagnosis above. The patient continues to have some  hypertension.   Suggest increasing Norvasc. Also we plan to get a BMP, lipid, LFTs,  hemoglobin A1c. He will see Dr. Juanda [name]  in 3 months. He will get an  abdominal ultrasound followup and a 2-D echo before.   I should note the EKG reveals normal sinus rhythm and right bundle  branch block.     [name] Cranker, MD, [facility]  Electronically Signed    EJL/MedQ  DD: 04/08/2007  DT: 04/08/2007  Job #: [id number]

## 2011-03-24 NOTE — Assessment & Plan Note (Signed)
[facility]                            CARDIOLOGY OFFICE NOTE   NAME:[name], [name]                    MRN:          [id number]  DATE:05/24/2008                            DOB:          Jul 23, 1929    PRIMARY CARE PHYSICIAN:  Rosalyn Gess. Norins, MD   CLINICAL HISTORY:  [name] returned for followup management of his  hypertrophic cardiomyopathy, hypertension, and hyperlipidemia.  I saw  him last in March just after he had returned from [facility] for  treatment of alcohol abuse.  He has done quite well since that time.  He  was in an outpatient program with [facility] for a period of time  and indicated he is now attending AA meeting 3 times a week.   He has had no chest pain.  He does have some shortness of breath with  exertion.  He has had some slight edema on his right lower extremity and  left lower extremity.   His past medical history is significant for hypertension and  hyperlipidemia, and he has hypertrophic cardiomyopathy.  His last echo  was performed in August 2008, at which time he had only mild left  ventricular wall thickness.   His current medications include Aciphex, Toprol-XL 150 mg daily,  aspirin, simvastatin 20 mg daily, Aldactone 25 mg daily, Lasix 20 mg  daily, and Norvasc 10 mg daily.   PHYSICAL EXAMINATION:  VITAL SIGNS:  The blood pressure was 136/72 and  the pulse 80 and regular.  NECK:  There was no venous distention.  The carotid pulses were full  without bruits.  CHEST:  Clear.  CARDIAC:  Rhythm was regular.  There was a grade 2/6 systolic ejection  murmur at the left sternal edge.  ABDOMEN:  Soft.  Normal bowel sounds.  EXTREMITIES:  There was some slight pinning of the right ankle, which  had somewhat unusual distribution.  He had previous scar from previous  injury in his right leg.  Pedal pulses were equal.   IMPRESSION:  1. Mild hypertrophic cardiomyopathy.  2. Hyperlipidemia.  3.  Hypertension.  4. Intolerance to LIPITOR.  5. History of alcohol abuse, now stable following treatment at      [facility].  6. History of alcoholic pancreatitis and liver disease.   RECOMMENDATIONS:  I think Takota is doing well.  We will plan to get  some fasting lab work including a CBC, BMP, TSH, lipid, and liver.  I  will see him back in followup in 6 months.     Bruce Elvera Lennox Juanda Chance, MD, [facility]  Electronically Signed    BRB/MedQ  DD: 05/24/2008  DT: 05/24/2008  Job #: [id number]

## 2011-03-24 NOTE — Assessment & Plan Note (Signed)
[facility]                            CARDIOLOGY OFFICE NOTE   NAME:Prows, CHIVAS NOTZ                    MRN:          [id number]  DATE:01/04/2008                            DOB:          January 29, 1929    PRIMARY CARE PHYSICIAN:  Rosalyn Gess. Norins, MD   GASTROENTEROLOGIST:  Vania Rea. Jarold Motto, MD, Clementeen Graham, FACP, FAGA.   CLINICAL HISTORY:  Falon Flinchum called me this morning and asked me to  see [name] today because of continued problems with alcohol and a  serious fall last night.  He was walking down the steps and fell face  first on a concrete sidewalk and went to the emergency room.  He had  multiple CTs and x-rays, but nothing was broken and he had a suture of  his lip.   [name] has a long history of alcohol problems.  He has intermittently  been to [facility] in the past and he had an outpatient treatment at  [facility] a couple of years ago. This has been very successful.  I  had seen him a few months ago with increased edema which we thought was  related to his alcoholic liver disease.  This improved with diuretics.  Recently over the last 3 weeks, he has had increased alcohol consumption  according to his wife.   PAST MEDICAL HISTORY:  1. Significant for hypertension.  2. Hyperlipidemia.  3. He has a hypertrophic cardiomyopathy. HE HAS BEEN INTOLERANT TO      LIPITOR AND HAS PERSISTENT PAIN WHICH HE FEELS IS RELATED TO THAT,      but he has been able to tolerate simvastatin.  4. He also has a previous hospitalization for alcoholic liver disease      and pancreatitis pain. During one period he was quite sick from      this.   CURRENT MEDICATIONS:  1. Aciphex.  2. Toprol.  3. Aspirin.  4. Benadryl.  5. Simvastatin.  6. Aldactone.  7. Lasix.  8. Penicillin which was prescribed last night.   PHYSICAL EXAMINATION:  Today blood pressure was 134/66 and pulse 77 and  regular.  There was no venous distension.  The carotid pulses were  full  without bruits.  CHEST:  Was clear without rales or rhonchi.  CARDIAC:  Rhythm is regular.  There is a grade 2/6 systolic ejection  murmur.  ABDOMEN: Abdomen was soft without organomegaly.  There was no  hepatosplenomegaly.  There was trace to 1+ peripheral edema.  Pedal pulses were equal.   Electrocardiogram showed right bundle branch block.   IMPRESSION:  1. Continued alcohol abuse.  2. Recent fall with multiple contusions and lacerations  3. History of alcoholic liver disease and pancreatitis with peripheral      edema, now controlled with diuretics.  4. Hypertension.  5. Hyperlipidemia.  6. Hypertrophic cardiomyopathy.  7. INTOLERANCE TO LIPITOR.   RECOMMENDATIONS:  [name] was with Carney Bern and his son Gaynelle Adu today and a  we talked quite openly and frankly for a long time about the need for  inpatient treatment for his alcohol problem.  Despite our warnings  that  this was resulting in serious medical and social problems and could lead  medical catastrophe in the future, [name] thus far has been unwilling  to agree to inpatient therapy.  My recommendation is that he be  hospitalized in [facility] for  treatment.  He and his family plan  to talk to Dr. Royston Bake and my hope is that Dr. Royston Bake may be able to  convince him to go to [facility] for inpatient treatment.  If not,  then I would agree with the family's plans to arrange an involuntary  commitment.     Bruce Elvera Lennox Juanda Chance, MD, [facility]  Electronically Signed    BRB/MedQ  DD: 01/04/2008  DT: 01/05/2008  Job #: [id number]   cc:   Rosalyn Gess. Norins, MD  Vania Rea. Jarold Motto, MD, Caleen Essex, FAGA

## 2011-03-24 NOTE — Assessment & Plan Note (Signed)
[facility]                            CARDIOLOGY OFFICE NOTE   NAME:[name], [name] LASCOLA                    MRN:          [id number]  DATE:09/20/2007                            DOB:          1929-05-21    PRIMARY CARE PHYSICIAN:  Rosalyn Gess. Norins, MD.   GASTROENTEROLOGIST:  Vania Rea. Jarold Motto, MD.   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  He has hypertrophic  cardiomyopathy and chronic alcoholic liver disease.  He comes in today  with complaints of increasing shortness of breath and swelling  progressive over the past 2 weeks.  He has had no associated chest pain,  no cough and no palpitations.  Unfortunately, he has continued to drink  at least to a moderate degree.   PAST MEDICAL HISTORY:  1. Significant for chronic alcoholic liver disease with portal      hypertension.  2. He also has had borderline renal insufficiency and his creatinine      is 1.3 by recent measurement last month.  3. He has a history of hypertension.  4. He was thought to have a small aneurysm, but his recent ultrasound      did not classify the mild ectasia in the aneurysm.   CURRENT MEDICATIONS:  1. Aciphex.  2. Toprol.  3. Multivitamins.  4. Lasix.  5. Aspirin.  6. Benadryl.  7. Simvastatin.  8. Norvasc.   PHYSICAL EXAMINATION:  VITAL SIGNS:  Blood pressure 152/71, pulse 94 and  regular.  There were no definite venous pulsations.  The carotid pulses  were full.  CHEST:  Clear without rales or rhonchi.  CARDIAC:  Rhythm was regular.  There is a 2/6 systolic ejection murmur  at the left sternal edge.  ABDOMEN:  Somewhat protuberant.  There is questionable ascites.  I could  not feel the liver below the costal margin.  EXTREMITIES:  There was 2-3+ peripheral edema.  There was some  additional swelling in the right upper thigh with some mild erythema and  questionable cellulitis.   Difficult to feel pedal pulses through the  edema.   IMPRESSION:  1. Increasing edema and anasarca probably related to progressive      alcoholic liver disease.  2. History of alcoholic liver disease and portal hypertension.  3. Hypertension.  4. Hypertrophic obstructive cardiomyopathy.  5. Hyperlipidemia.   RECOMMENDATIONS:  I think [name] [name]'s progressive edema is most probably  related to his chronic liver disease.  We will get a chest x-ray today  and we will get some laboratory studies including a complete metabolic  panel with liver function tests including albumin and pro-time.  We will  get a BNP and a CBC as well.  We will increase his Lasix from 20-40 a  day and start him on Aldactone 25 a day.  I will consider putting him on  Keflex for possible cellulitis of his right lower extremity.  I told him  that our long-term solution will not be very good if we cannot get him  off of alcohol.  He  seems more receptive to this, but has declined  coming in the hospital now.  I will try to talk to Arta Silence and we  will see what strategies might be best for our working on dealing with  the problem.  We asked him to weigh himself every day at home.   FOLLOW UP:  I will plan to see him back in a week.     Bruce Elvera Lennox Juanda Chance, MD, [facility]  Electronically Signed    BRB/MedQ  DD: 09/20/2007  DT: 09/21/2007  Job #: [id number]

## 2011-03-24 NOTE — Assessment & Plan Note (Signed)
[facility]                            CARDIOLOGY OFFICE NOTE   NAME:[name], [name] KREUZER                    MRN:          [id number]  DATE:09/28/2007                            DOB:          Dec 09, 1928    PRIMARY CARE PHYSICIAN:  Dr. Illene Regulus   GASTROENTEROLOGIST:  Dr. 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  [name] has a hypertropic cardiomyopathy and has  chronic alcoholic liver disease.  I saw [name] on November 11 with symptoms  of increasing shortness of breath and swelling over the past few weeks.  We thought [name] had ascites and edema related to his alcoholic liver  disease.  We did a chest x-ray which showed no congestion.  We started  [name] on aldactone 25 mg a day and increased his Lasix from 20 mg to 40 mg  a day.  We also got laboratory work.  [name] has done much better since  then.  His weight has fallen from 192 on his home scales on November 10  to 174 on his home scales on November 18.  [name] also had some cellulitis  in his right extremity and we started [name] on Keflex and this  symptomatically has improved.   [name] says that [name] has not had any alcohol to drink since his last visit  here.  [name] has a history of long drinking and has been drinking in excess  since his retirement.  [name] had been seen as an outpatient in the past at  [facility] and had gone to [facility] for a period of time but [name]  stopped this some time ago.   His past medical history is significant for his chronic alcoholic liver  disease with portal hypertension.  [name] was admitted to the hospital a  couple of years ago quite sick with his alcoholic liver disease.  [name]  also has borderline renal insufficiency, hypertension.  [name] had a history  of possible aneurysm but his last ultrasound did not show any aneurysm.   Current medications include AcipHex, Toprol-XL, aspirin, simvastatin,  Norvasc, Lasix, and  aldactone.   On examination, the blood pressure was 146/64 and the pulse 72 and  regular.  There was no venous distension.  The carotid pulses were full  without bruits.  The chest was clear.  Cardiac rhythm was regular.  There was a short 2/6 systolic murmur left sternal edge.  The abdomen  was slightly protuberant.  There may have been a small amount of ascites  present.  I could not feel his liver.  There was trace peripheral with  some stasis changes.  There appeared to be a fluctuant area over the  right pretibial area that did not appear to be infected.  The redness  that was present a couple of weeks ago was much improved.   IMPRESSION:  1. Alcoholic liver disease with recent increased ascites and edema,      now improved following diuretic therapy.  2. Chronic alcohol use.  3. Hypertropic cardiomyopathy.  4. Hypertension.  5. Hyperlipidemia with myopathy on Lipitor with residual symptoms.   RECOMMENDATIONS:  [name] is doing much better from the standpoint of  his fluid retention and ascites.  We will plan to decrease his Lasix  from 40 mg to 20 mg a day with the goal of keeping his weight at the  current level.  Will get a repeat BNP today.  [name] was with [name]  today and we talked about his alcohol use.  [name] says that [name] has not had  anything to drink since his last visit here 2 weeks ago.  [name]  indicated that [name] had had no withdrawal symptoms that she had seen.  I  told [name] the importance of getting off alcohol completely and I told [name]  that his liver disease was severe and [name] did have cirrhosis despite the  fact that his liver function tests were not too bad on his recent  laboratory studies.  [name] has had elevated ammonia levels, elevated pro  time levels, and ascites to indicate decompensated liver disease.  I  told [name] that in order to get off alcohol [name] needs to stop drinking  completely and offered to help if [name] has any relapse.   I will plan to see [name] back  in 6 weeks.     Bruce Elvera Lennox Juanda Chance, MD, [facility]  Electronically Signed    BRB/MedQ  DD: 09/28/2007  DT: 09/29/2007  Job #: [id number]

## 2011-03-24 NOTE — Assessment & Plan Note (Signed)
[facility]                            CARDIOLOGY OFFICE NOTE   NAME:[name], [name] WINTLE                    MRN:          [id number]  DATE:11/14/2008                            DOB:          [birth year]    PRIMARY CARE PHYSICIAN:  [name] Gess. Norins, MD   CLINICAL HISTORY:  [name] is a 77 years old and returned for a  followup management of his hypertrophic cardiomyopathy, hypertension,  and hyperlipidemia.  I had seen him last March after he returned from  [facility] for treatment of his alcohol problem and he has done  extremely well since that time.  He has been going to [employer] [facility] [employer] 4  times a week and has not had any alcohol consumption.  He has been doing  fairly well with his heart.  He gets a little short of breath with  exertion and has had no chest pain or palpitations.  He does have some  chronic edema of the lower extremities, which has not changed.   PAST MEDICAL HISTORY:  Significant for hypertension and hyperlipidemia.  He has been intolerant in the past of LIPITOR.  He has a history of  alcoholic pancreatitis and alcoholic liver disease in the past.   CURRENT MEDICATIONS:  1. Aciphex.  2. Toprol-XL 150 mg daily.  3. Aspirin 325 mg daily.  4. Simvastatin 20 mg daily.  5. Aldactone 25 mg daily.  6. Lasix 20 mg daily.  7. Norvasc 10 mg daily.   PHYSICAL EXAMINATION:  VITAL SIGNS:  The blood pressure is 127/70.  NECK:  There was no venous distention.  The carotid pulses were full  without bruits.  CHEST:  Clear without rales or rhonchi.  CARDIAC:  Rhythm is regular.  There was a 2/6 systolic ejection murmur  at the left sternal edge.  ABDOMEN:  Soft.  Normal bowel sounds.  EXTREMITIES:  There was 1+ edema bilaterally.  Pedal pulses were equal.   Electrocardiogram showed right bundle branch block.   IMPRESSION:  1. Hypertrophic cardiomyopathy, stable.  2. Venous insufficiency of the lower extremity.  3. Hypertension.  4. Hyperlipidemia with intolerance to LIPITOR.  5. History of alcohol abuse, now stable following treatment at      [facility].  6. History of alcoholic pancreatitis and liver disease.   RECOMMENDATIONS:  I think [name] is doing quite well.  I think the  edema of the lower extremities are related to venous insufficiency and  diastolic heart failure and this could be aggravated by the Norvasc.  I  discussed with [name] the possibility of lowering the dose of Norvasc  for switching him to another antihypertensive, but he is happy with his  current situation, so we will change it.  We will get a CBC and BMP  today.  I will see him back in followup in 6 months.   ADDENDUM:  His son [name] is opponent to a district judge.  [name]'s wife is already who is teaching [employer].     Bruce [name] Lennox Juanda Chance, MD, [facility]  Electronically Signed    BRB/MedQ  DD: 11/14/2008  DT:  11/15/2008  Job #: [id number]

## 2011-03-24 NOTE — Assessment & Plan Note (Signed)
[facility]                            CARDIOLOGY OFFICE NOTE   NAME:Hosterman, TYLER CUBIT                    MRN:          [id number]  DATE:02/01/2008                            DOB:          11-18-1928    PRIMARY CARE PHYSICIAN:  Rosalyn Gess. Norins, MD.   GASTROENTEROLOGIST:  Vania Rea. Jarold Motto, MD.   CLINICAL HISTORY:  Kendre Jacinto returned for a follow-up visit today.  He had spent 3 weeks in [facility] and has now been home a week and  is in outpatient services.  He has  done quite well from the standpoint  of his heart and has had no swelling or edema.  No chest pain or  shortness of breath.   He had a chest x-ray done at [facility] because of a positive PPD  and this showed compressed sclerotic appearing lower thoracic vertebral  body. Dr. Audie Pinto read the film and said cannot exclude metastatic  involvement.   PAST MEDICAL HISTORY:  Significant for hypertension, hyperlipidemia and  hypertrophic cardiomyopathy.  He has been intolerant to LIPITOR because  of some muscle pains. He has had a previous hospitalization for  alcoholic liver disease and pancreatitis.   CURRENT MEDICATIONS:  Aciphex, Toprol, aspirin, simvastatin, Aldactone  and Lasix.   PHYSICAL EXAMINATION:  Blood pressure is 147/69, pulse 78 and regular.  There was no venous tension.  The carotid pulses were full without  bruits.  CHEST:  Clear without rales or rhonchi.  CARDIAC:  Rhythm was regular.  Apex was slightly displaced.  There is a  grade 2/6 systolic ejection murmur at the left sternal edge.  ABDOMEN:  Soft with normal bowel sounds.  There was trace to 1+  peripheral edema.  Pedal pulses were equal.   IMPRESSION:  1. Compressed and sclerotic appearing lower thoracic vertebral body,      rule out metastatic disease.  2. History of alcoholic pancreatitis and liver disease.  3. Hypertrophic cardiomyopathy.  4. Hyperlipidemia.  5. Hypertension.  6.  Intolerance to LIPITOR.   RECOMMENDATIONS:  I spoke with Audie Pinto today and he thinks the best  way to evaluate the abnormal x-ray findings is with a bone scan.  We  will plan to arrange a total body bone scan and then decide about  further treatment.  Will plan to see Charlie back in a few months.     Bruce Elvera Lennox Juanda Chance, MD, [facility]  Electronically Signed    BRB/MedQ  DD: 02/01/2008  DT: 02/02/2008  Job #: [id number]

## 2011-03-24 NOTE — Assessment & Plan Note (Signed)
[facility]                            CARDIOLOGY OFFICE NOTE   NAME:[name], [name] SCHULENBURG                    MRN:          [id number]  DATE:11/25/2007                            DOB:          [birth year]    PRIMARY CARE PHYSICIAN:  [name].   GASTROENTEROLOGIST:  [name].   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  I've been seeing  [name] over the past few months with increasing edema and some ascites,  which we think is related to alcoholic liver disease.  We have been  treating with Lasix and Aldactone and that this improved his edema and  weight.  His weights had been quite stable at home and has noticed no  increased swelling.   He says he is doing much better with the drinking but has not completely  quit.  He was drinking some vodka until 2 weeks ago but [name], his  wife, has work with him and he now is just drinking some wine.  He knows  that the goal was to quit entirely.   He had no chest pain, shortness of breath or palpitations.   PAST HISTORY:  Significant for a hypertrophic cardiomyopathy.  He also  has hypertension and hyperlipidemia.  He was intolerant to Lipitor and  has hip pain which is persisted and he feels is related to the Lipitor.   CURRENT MEDICATIONS:  Include Aciphex, Toprol, multivitamins, Benadryl,  simvastatin, aldactone, and Lasix, and Norvasc.   EXAMINATION:  The blood pressures 120/52.  His pulse 82 and regular.  Weight was 174 which was down 6 pounds from November.  There was no venous distension.  The carotid pulses were full with  slight short transmitted bruits.  CHEST:  Was clear without rales or rhonchi.  CARDIAC:  Rhythm was regular.  There is a grade 2/6 systolic ejection  murmur.  The abdomen was soft with normal bowel sounds.  I could  not feel the  liver.  There was trace peripheral edema.  Pedal pulses are equal.   IMPRESSION:  1. Peripheral edema and ascites related to alcoholic liver disease,      now improved and controlled on current therapy.  2. Hypertrophic cardiomyopathy.  3. Hypertension.  4. Hyperlipidemia.  5. Intolerance to LIPITOR.   RECOMMENDATIONS:  I think is doing well from the standpoint of his  volume and fluid management.  Will plan to continue the aldactone and  low-dose Lasix.  Will get a BMP and CBC.  He is also concerned about a  sugar and had borderline elevated A1c in the past.  Will get a fasting  sugar and hemoglobin A1c, and refer him to [name] if these are  abnormal.  I encouraged him to continue to work on cutting out alcohol  altogether.  Plan to see him back in follow-up and 4 months.     [name] Elvera Lennox Juanda Chance, MD, [facility]  Electronically Signed    BRB/MedQ  DD: 11/25/2007  DT: 11/25/2007  Job #: [id number]

## 2011-03-27 NOTE — Discharge Summary (Signed)
NAMENALIN, MAZZOCCO NO.:  [id number]   MEDICAL RECORD NO.:  [id number]          PATIENT TYPE:  INP   LOCATION:  6729                         FACILITY:  [facility]   PHYSICIAN:  Jennye Moccasin, P.A. LHCDATE OF BIRTH:  06-Jun-1929   DATE OF ADMISSION:  11/19/2004  DATE OF DISCHARGE:  11/21/2004                                 DISCHARGE SUMMARY   CONTINUATION   PROBLEM LIST:  1.  Hyperglycemia.  The patient's glucose was 157.  We discontinued glucose      from his IV fluid and on recheck it was 87.  Note that blood level of      157 was obtained before he was started on IV fluids so he needs to be      watched for diabetes mellitus type 2.   DISCHARGE CONDITION:  Stable.   DISCHARGE MEDICATIONS:  Lasix 40 mg daily.  Avalox 400 mg daily for three  days.  Aciphex 20 mg daily.  4Q1 daily.  Valium 20 mg twice daily.  Lactulose 30 mL once daily, could be divided into two separate doses.  Aspirin 81 mg daily.  Diovan 160/hydrochlorothiazide  12.5 mg combination  medication once daily.  He is to stop taking Aleve and Levaquin.   FOLLOWUP APPOINTMENTS:  With Sheryn Bison on 1/26 at 10:30 a.m.  He is  also to call Dr. Debby Bud' office to set up an appointment and have his  thyroid tests rechecked in one month.       SG/MEDQ  D:  11/21/2004  T:  11/21/2004  Job:  [id number]   cc:   Rosalyn Gess. Norins, M.D. [facility]   Vania Rea. Jarold Motto, M.D. [facility]

## 2011-03-27 NOTE — Assessment & Plan Note (Signed)
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  He also has a history of  alcoholic hepatitis with cirrhosis, followed closely by Dr. Jarold Motto.  He  has a small abdominal aortic aneurysm, 2.5 cm.  His renal function had  deteriorated when I saw him in March with a BUN of 76, creatinine of 3.  We  discontinued his Advil, Diovan, hydrochlorothiazide, and decreased his Lasix  to 20.  His renal function improved dramatically to BUN of 26, creatinine  1.4 as of Apr 02, 2006.  He fatigues rather easily but has no dyspnea or  chest pain, no palpitation.  He is on :   1. AcipHex 20 mg.  2. Toprol XL 150 mg.  3. Multivitamin.  4. Lasix 20 mg.  5. Aspirin 162 mg.  6. Norvasc 5 mg.  7. Benadryl.   PHYSICAL EXAMINATION:  VITAL SIGNS:  Blood pressure 112/64, pulse 55, normal  sinus rhythm.  GENERAL APPEARANCE:  Unremarkable.  NECK:  JVP is not elevated.  Carotid pulses palpable and equal without  bruits.  LUNGS:  Clear.  CARDIAC:  2-3/6 moderate-length systolic ejection murmur last at the aortic  area.  No diastolic murmur.  Does not radiate to the neck.  ABDOMEN:  Unremarkable.  EXTREMITIES:  Trace edema.   IMPRESSION:  Diagnoses as above.  His renal function is much improved.  His  hypertension is well-controlled.  The murmur is more prominent in the aortic  area.  His EKG reveals right bundle branch block, normal sinus rhythm.  I  have suggested a follow-up 2-D echo, BNP, lipids and LFTs.  We will see him  back in 4 months and follow up with the abdominal ultrasound at that time.                              Cecil Cranker, MD, [facility]    EJL/MedQ  DD:  07/08/2006  DT:   07/09/2006  Job #:  [id number]

## 2011-03-27 NOTE — Assessment & Plan Note (Signed)
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  He also in the past had a severe episode of  rhabdomyolysis associated with intoxication.  He is followed medically by  Dr. Illene Regulus and cardiac-wise by Dr. Glennon Hamilton.  He has hypertrophic  cardiomyopathy.  He also has a history of essential hypertension and  hyperlipidemia.   I last saw [name] on June 15, 2006, at which time he was well but  continued to use ethanol.  He continues to drink daily but denies alcohol  abuse.  He has no real interest in discontinuing alcohol and using  medications for compulsive drinking.  He also has resisted efforts  previously to outpatient alcohol rehabilitation.   In terms of symptoms, he denies abdominal pain, distention, peripheral  edema, mental status changes, pruritus, etc.  He does complain of easy  fatigability and shortness of breath with exertion.  He has IHSS and  scheduled followup 2-D echocardiogram with Dr. Glennon Hamilton in December.   MEDICATIONS:  1. For diuretics, he is currently taking Lasix 20 mg a day.  2. Aciphex 20 mg a day.  3. Toprol XL 150 mg a day.  4. Aspirin 325 mg 1/2 tablet a day.  5. Norvasc 5 mg a day.  6. Simvastatin 20 mg a day.  7. Advil PM at bedtime.   He says his mental status is clear, and he has had no problems at home with  his memory.  He apparently is getting along with his wife whom he says,  would like to see me quit  drinking.  He also has had some problems with  prerenal azotemia which seemed to resolve with diuretic adjustments.   PHYSICAL EXAMINATION:  GENERAL:  Exam shows him to be awake and alert and in  no acute distress.  VITAL SIGNS:  He weighs 193 pounds, which is up 10 pounds from a year ago.  Blood pressure 120/60, and pulse was 78 and regular.  HEENT: The patient was not icteric.  CHEST: Clear except for a few scattered rhonchi in the right posterior lung  field.  CARDIAC: He did have a prominent 2-3/6 systolic ejection murmur at the base  of the heart but no S3 gallop.  ABDOMEN:  I could not appreciate hepatosplenomegaly, ascites, abdominal  masses or tenderness.  EXTREMITIES: There wsz no peripheral edema, phlebitis, or swollen joints.  NEUROLOGIC:  Mental status was normal.  There was trace asterixis and a fine  resting tremor of his hands.  He appeared oriented x3.   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  Of note, his lab data have persistently elevated blood glucose of  157 in May and 181 in September.  He has no history of diabetes, but I  suspect he does have glucose intolerance.  He seems to be stable from a GI  standpoint.  Of course, it would be ideal if he would stop using alcohol,  but this apparently is not going to happen.  His cirrhosis appears to be  Child's A, and he seems to be fairly well compensated at this time.   RECOMMENDATIONS:  1. Continue on medications at current levels.  2. Check hemoglobin A1c, and if elevated, consider referral back to Dr.      Debby Bud for diabetic control.  3. Patient again urged to discontinue alcohol use altogether and attend AA      meetings.  4. GI followup in 3-4 months' time.     Vania Rea. Jarold Motto, MD, [facility], FAGA  Electronically Signed    DRP/MedQ  DD: 09/10/2006  DT: 09/10/2006  Job #: [id number]   cc:   Cecil Cranker, MD, [place]  Rosalyn Gess. Norins, MD

## 2011-03-27 NOTE — H&P (Signed)
NAMELENELL, LAMA NO.:  [id number]   MEDICAL RECORD NO.:  [id number]          PATIENT TYPE:  INP   LOCATION:  1832                         FACILITY:  [facility]   PHYSICIAN:  Thomos Lemons, D.O. [facility]   DATE OF BIRTH:  11/20/1928   DATE OF ADMISSION:  01/14/2005  DATE OF DISCHARGE:                                HISTORY & PHYSICAL   PRIMARY CARE PHYSICIAN:  Dr. Illene Regulus   CHIEF COMPLAINT:  Fall.   HISTORY OF PRESENT ILLNESS:  Patient is a 77 year old white male with a  history of chronic alcohol abuse, alcoholic cirrhosis who was brought into  the ER today after he was found by his neighbors at his [place] down on the  ground.  Patient states that he does not recall the events of the fall but  he was out last night drinking with a group of friends.  Patient does not  remember whether it was 12 a.m. or possibly 3 to 4 a.m. in the morning but  apparently patient was found by his neighbors in his carport near his motor  vehicle approximately 7 a.m. this morning.  Patient does not recall being  dizzy or having any prodromal symptoms prior to the fall.  Patient denies  any preceding illness.  Patient has been in normal health prior to this  incident.   REVIEW OF SYSTEMS:  No chest pain.  No shortness of breath.  No HEENT  symptoms.  Patient has general soreness and pain in the right side of his  chest with deep inhalation.  No recent diarrhea, nausea, or vomiting.   PAST MEDICAL HISTORY:  1.  Alcoholic cirrhosis.  2.  Chronic renal insufficiency.  Baseline creatinine 1.8.  3.  Malnutrition.  4.  History of hypertrophic cardiomyopathy.  5.  Depression.  6.  Remote history of hyperplastic colon polyps and diverticulosis.   SOCIAL HISTORY:  He lives with his wife in [place].  He is a retired  Sports administrator.  The patient has no history of tobacco use.  Currently says he  drinks approximately one to two glasses of wine.   FAMILY HISTORY:  Patient has a Down  syndrome child.   CURRENT MEDICATIONS:  Toprol XL 100 mg once a day.  Diovan/hydrochlorothiazide 160/12.5 once a day.  Lasix unknown dose 10 mg every other day.  Aciphex 20 mg once a day.  Baby aspirin 81 mg once a day.  Patient also states that he was recently put on a medication by his  gastroenterologist for possible encephalopathy.  Patient does not recall the  name.  He was taking approximately 200 mg three times a day.   LABORATORY DATA:  Basic metabolic panel:  Sodium 135, potassium 6.4,  chloride 109, bicarbonate 20, BUN 35, creatinine 1.7, blood sugar 114.  A  repeat potassium was done several hours later in the ER which was decreased  at 5.8 after patient was given bicarbonate and calcium gluconate as well as  D5 and insulin.  CBC:  WBC 19.0, H&H 13.4/39.4, platelets 241.  Blood  alcohol level is elevated at 66.  CK was  elevated at 3144.  Patient had x-  ray of his chest which showed two right-sided rib fractures, no  pneumothorax.  CT of the head did not show any true abnormality.  CT of the  C spine showed cervical spondylosis, but no acute findings.  Patient was  evaluated by the trauma service.  EKG showed normal sinus rhythm at 82 beats  per minute, right bundle branch block, and no acute ST changes.   PHYSICAL EXAMINATION:  VITAL SIGNS:  Patient was hypothermic on admission.  Temperature was 93.4.  Patient was warmed to current temperature 97.5.  Blood pressure 132/60, pulse 98, respirations 15.  Patient is 97% on room  air.  GENERAL:  The patient is a 77 year old white male awake, alert, oriented.  HEENT:  Normocephalic.  Patient has edema over the right eye.  No visual  field loss.  Patient has seborrheic dermatitis over the forehead.  NECK:  No JVD.  LUNGS:  Tubular breath sounds at the right base, otherwise clear.  CARDIOVASCULAR:  Regular rate and rhythm.  Positive systolic ejection murmur  2/6 loudest in the right sternal border that decreases with Valsalva   maneuver.  ABDOMEN:  Soft, nontender.  Positive bowel sounds.  No organomegaly.  EXTREMITIES:  Patient able to move all four extremities without difficulty.  No pain with compression of his pelvis.  NEUROLOGIC:  He was nonfocal.   ASSESSMENT/PLAN:  1.  Status post fall likely secondary to alcohol use.  2.  Rhabdomyolysis with elevated CPK and hyperkalemia.  3.  Right rib fractures.  4.  History of alcoholic cirrhosis with history of encephalopathy.  5.  Leukocytosis.  6.  Hypertension.  7.  Chronic renal insufficiency.   RECOMMENDATIONS:  We will hydrate the patient with IV fluids, monitor on  telemetry.  Patient given calcium gluconate and bicarbonate with D5 and  insulin.  Patient's potassium is likely elevated secondary to  rhabdomyolysis.  We will also give Kayexalate 15 g twice a day, hold for  diarrhea.  We will check a repeat BNP at approximately 6 p.m. today.  We  will monitor his wbc's at this time.  He has no obvious signs or symptoms of  infection.  Patient's elevated white blood cells may be secondary to  hemoconcentration.  Patient's renal status seems to be stable at this time.  In regards to his chronic alcohol use, we will discuss possibly starting the  patient on acamprosate to decrease his alcohol use in the future.  This  seems to be an ongoing issue for the patient.  Patient also states that he  has been treated for depression in the past and this will be further  discussed as an outpatient with Dr. Debby Bud.      RY/MEDQ  D:  01/14/2005  T:  01/14/2005  Job:  [id number]   cc:   Rosalyn Gess. Norins, M.D. [facility]

## 2011-03-27 NOTE — Discharge Summary (Signed)
NAMEIZYAN, EZZELL             ACCOUNT NO.:  [id number]   MEDICAL RECORD NO.:  [id number]          PATIENT TYPE:  INP   LOCATION:  3008                         FACILITY:  [facility]   PHYSICIAN:  Stacie Glaze, M.D. LHCDATE OF BIRTH:  1929/08/24   DATE OF ADMISSION:  01/14/2005  DATE OF DISCHARGE:  01/17/2005                                 DISCHARGE SUMMARY   ADMITTING DIAGNOSES:  1.  Rhabdomyolysis.  2.  Alcoholic encephalopathy.  3.  Hypertension.   DISCHARGE DIAGNOSES:  1.  Alcoholic encephalopathy.  2.  Alcoholic hepatitis.  3.  Hypertension.   HOSPITAL COURSE:  The patient is a 77 year old white male who presented via  the emergency room for a fall with resulting rhabdomyolysis secondary to  acute intoxication.  He has a history of alcoholic cirrhosis, chronic  insufficiency and malnutrition secondary to alcohol hypertrophic  cardiomyopathy and depression.  He was admitted to the general medicine  service and underwent appropriate detoxification and was treated for  alcoholic hepatitis which resolved and alcoholic encephalopathy which  improved at the time of his discharge.  At the time of his discharge a long  discussion was obtained with the patient and the patient's wife about his  need for outpatient therapy.  He states that he is committed to not drinking  but he is not committed to an inpatient detoxification and rehab program.  He states that he would consider an outpatient program, however.   He is discharged to home on new medications which include:  1.  Diovan HCT 160/12.5 one p.o. daily.  2.  Toprol XL 100 one p.o. daily.  3 . Aciphex 20 mg one p.o. daily.  1.  Vivactil 10 mg t.i.d.  2.  Multivitamin.   He is instructed to followup with Dr. __________ within one week to arrange  rehab as an outpatient.      JEJ/MEDQ  D:  01/17/2005  T:  01/18/2005  Job:  [id number]

## 2011-03-27 NOTE — Assessment & Plan Note (Signed)
[facility] [facility]                           GASTROENTEROLOGY OFFICE NOTE   NAME:[name], [name]                    MRN:          [id number]  DATE:06/15/2006                            DOB:          1929-09-10    [name] returns today and denies the complaints.  [name] says [name] is having  no difficulties whatsoever, has been able to travel and continues to use  wine, one to two glasses on a regular basis at night time.  [name] has had no  new mental status changes, increasing edema, shortness of breath or ascites.  [name] continues on the same medication list reviewed in his chart, complete  AcipHex 20 mg a day, aspirin 325 mg a day, Toprol XL 150 mg a day,  multivitamins, Lasix 20 mg a day, Norvasc 5 mg a day, Benadryl 50 mg at  bedtime.  [name] is p.r.n. in Advil and Ultram for myalgias.   LABORATORY DATA:  From his last visit showed normal CBC and metabolic  profile, except for a creatinine of 1.4 mg%.  His liver function tests and  pro-thrombin time were normal, and his glomerular filtration rate was 53.   PHYSICAL EXAMINATION:  GENERAL:  [name] is awake and alert, in no acute  distress.  I cannot appreciate icterus at this time.  CHEST:  Generally clear.  CARDIAC:  Does show what appears to be an S4 gallop with systolic ejection  murmur with no S3 gallop.  ABDOMEN:  I cannot appreciate definite hepatosplenomegaly, at this time  although his spleen tip is palpable in the left upper quadrant.  There is no  abdominal ascites, other abdominal masses or tenderness.  EXTREMITIES:  His peripheral extremities showed 1+ edema with no phlebitis.  MENTAL STATUS:  Clear, and there is no asterixis.   IMPRESSION:  1.  [name] A alcoholic cirrhosis with continued ethanol use.  2.  History of chronic mild encephalopathy without treatment.  3.  Resolved renal insufficiency secondary to excessive diuretics.  4.  Previous history of spontaneous bacterial peritonitis.  5.  History  of portal gastropathy secondary to portal hypertension.  6.  History of IHSS followed by Dr. Glennon Hamilton.  7.  History of mild essential hypertension, hyperlipidemia.   RECOMMENDATIONS:  1.  Repeat lipid profile fasting at the patient's request.  2.  Continue all other medications listed above.  3.  Regular visits at three-month intervals or p.r.n. as needed.  4.  Patient again strongly advised to cease ethanol intake which I am rather      sure [name] will not do      at this time.  [name] also has an appointment to see Dr. Glennon Hamilton within      the next month for cardiac evaluation.                                   Vania Rea. Jarold Motto, MD, [facility], [place]   DRP/MedQ  DD:  06/15/2006  DT:  06/15/2006  Job #:  [id number]   cc:  Cecil Cranker, MD, [facility]  Rosalyn Gess. Norins, MD

## 2011-03-27 NOTE — Assessment & Plan Note (Signed)
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 Jarold Motto.  His renal  insufficiency improved with discontinuing the Avapro and decreasing the  Lasix.  The most recent BUN, September 13, was 1.4, creatinine 1.7.  He  had elevated hemoglobin A1c.   He has had some shortness of breath, but no dizziness or chest pain, no  palpitations.   1. He is on AcipHex 20.  2. Toprol XL 150.  3. Multivitamin.  4. Lasix 20.  5. Aspirin 162.  6. Norvasc 5.  7. Simvastatin 20.   Blood pressure is 122/70, pulse 80, normal sinus rhythm.  GENERAL APPEARANCE:  Obese.  JVP is elevated.  Carotid pulses are  bounding, without bruit.  LUNGS:  Clear.  CARDIAC EXAM:  Systolic ejection murmur at the aortic area and apex.  No  radiation to the neck.  ABDOMINAL EXAM:  Obese.  EXTREMITIES:  No edema.   IMPRESSION/DIAGNOSES:  As above.  The patient appears to be stable.  I  do think he probably has diabetes.   We plan to recheck his lipids, LFT, BMP and hemoglobin A1c.  I will see  him back in four months.  He would like to follow up with Dr. Juanda Chance  after that.     Cecil Cranker, MD, [facility]  Electronically Signed    EJL/MedQ  DD: 11/26/2006  DT: 11/26/2006  Job #: [id number]

## 2011-03-27 NOTE — H&P (Signed)
NAMENATHANIAL, ARRIGHI             ACCOUNT NO.:  [id number]   MEDICAL RECORD NO.:  [id number]          PATIENT TYPE:  INP   LOCATION:  3742                         FACILITY:  [facility]   PHYSICIAN:  Wilhemina Bonito. Marina Goodell, M.D. [facility] OF BIRTH:  Jul 04, 1929   DATE OF ADMISSION:  11/19/2004  DATE OF DISCHARGE:                                HISTORY & PHYSICAL   ADMITTING PHYSICIANS:  Dr. Sheryn Bison and Dr. Yancey Flemings.   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  He has a history of hypertension, cardiomyopathy,  hyperlipidemia.  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  Probably has portal hypertension and  possibly esophageal varices.  Since mid to late December he has been  periodically seen by Dr. Sheryn Bison in the office.  Initially this was  because of diarrhea.  This is now under control and he does use half an  Imodium tablet a day.  He had a colonoscopy several years back which  revealed hypoplastic polyps and diverticulosis.   It has been brought to the attention of Dr. Jarold Motto and Dr. Debby Bud that  the patient, who is drinking one to two bottles of wine daily, was having  more and more functional difficulties related to his alcohol abuse.  In  addition, he developed edema of the lower extremities and ascites.  In  addition, the LFTs were elevated and these will be outlined below.  Dr.  Jarold Motto ordered a CT scan performed on December 27.  This showed a  moderate amount of upper abdominal ascites with cirrhotic appearance to the  liver which was heterogenous and had some indeterminate low density foci.  In addition, there was a small 2.5 cm abdominal aortic aneurysm distally  with some dilation of the bilateral common iliac arteries and vascular  calcification was also noted.  Otherwise, benign findings of some scattered  diverticulosis, a small tiny left pleural effusion, and degenerative  disc  and vertebral changes along with some scoliosis were noted.   The patient has been somewhat difficult to manage medically because he self-  medicates and had been taking potassium on his own and at one point his  potassium measured 6.  He has since quit taking the potassium.  Dr.  Jarold Motto had planned to perform an upper endoscopy to rule out varices.  However, labs on January 3 and again yesterday, when he last saw Dr.  Jarold Motto, revealed that his white blood cell count was elevated yesterday  to 17.4.  He did not complain of abdominal pain nor nausea or vomiting but  Dr. Jarold Motto was concerned that he could have SBP.  The patient did not  want to come in the hospital yesterday but agreed to come in today for  various tests to include a diagnostic paracentesis as well as upper  endoscopy.  In the meantime, he started him on Levaquin.  The patient says  he has not had a drink of alcohol for 3 months.  However, on a January 3  office visit it is dictated that he  had only managed to decrease his wine  consumption to one glass every several days.  Dr. Jarold Motto also received a  note from the patient's wife over the holidays stating the patient's long-  term alcohol abuse and daily heavy wine consumption.  It is not clear  whether he has had a formal intervention from family or friends, but friends  have offered help and he has refused any alcohol rehabilitation programs.   The patient does say that he has managed to lose some abdominal girth and he  has taken about 8 pounds of weight off.  His lower extremity edema has also  improved over the last few weeks.  He has had prior hepatitis B vaccination  and is hepatitis A and B negative.   ALLERGIES:  No known drug allergies.   CURRENT MEDICATIONS:  1.  AcipHex 20 mg daily.  2.  Lasix 20 mg daily.  3.  Aleve two p.o. daily.  4.  Dramamine one p.o. at h.s. for insomnia.  5.  Imodium one-half p.o. daily.  6.  Flora-Q one p.o.  daily.  7.  Levaquin 500 mg daily.  8.  Valium 20 mg twice a day.   He has only been taking the Flora-Q and Levaquin for less than 1 day.  The  patient denies potassium supplementation.   PAST MEDICAL HISTORY:  1.  Seborrheic dermatitis.  2.  History of hyperplastic colon polyps and diverticulosis on remote      colonoscopy.  3.  Macrocytosis with very mild anemia.  4.  Alcoholism, chronic.  5.  Depression.  Has been seen by Dr. Laurelyn Sickle in the past.  6.  Hypertension.  7.  Hypertrophic cardiomyopathy.  8.  Hyperlipidemia.  9.  Cirrhosis, ascites, and rule out SBP.  10. Small abdominal aortic aneurysm.  11. Renal insufficiency with BUN of 32 and creatinine of 1.8 on January 3.  12. History alcoholic gastroenteritis.   SOCIAL HISTORY:  The patient lives in [place] with his wife.  He does not  smoke.  Alcohol consumption is not clear at this point.  The patient denies  for 3 months but other evidence suggested he is actively drinking.   FAMILY HISTORY:  No family history of liver disease.   REVIEW OF SYSTEMS:  PULMONARY:  No shortness of breath, no pleuritic pain,  no cough.  CARDIAC:  No palpitations, no chest pain.  GENERAL:  Very weak,  lacking in energy.  Appetite is marginal though he does eat three small  meals daily.  Bowel movements:  One to two soft stools daily.  ENDOCRINE:  No sweats, no excessive thirst, no history of diabetes, no excessive  urination.  GU:  No hesitancy, no nocturia.  HEMATOLOGIC:  Denies bleeding  disorder.  DERMATOLOGIC:  Does have seborrhea of the facial skin and does  not use anything other than some over-the-counter urea creams.  He scratches  at this and sometimes it bleeds.   PHYSICAL EXAMINATION:  VITAL SIGNS:  Temperature is 97.7, blood pressure  120/66, pulse is 79, respirations 21, room air saturation 95%.  His weight  on January 3 was 184 pounds.  I do not have a repeat level for today yet. GENERAL:  The patient is a somewhat  ill-appearing white gentleman who is not  obese and not cachectic.  He is impatient with staff attending to him.  He  is alert and oriented x3.  HEENT:  No scleral icterus, no pallor, and extraocular movements are intact.  Oropharynx is clear with moist mucosa.  NECK:  Without JVD or masses.  CHEST:  Clear to auscultation and percussion bilaterally.  There is no cough  and no shortness of breath.  CARDIAC:  There is a regular rate and rhythm with a soft systolic murmur,  about 1-2/6 in intensity.  ABDOMEN:  Protuberant and nontender.  It is soft.  Bowel sounds are active.  I do not appreciate a liver edge.  There is no fluid wave but there is  bulging flanks consistent with ascites.  Do not appreciate the spleen today.  RECTAL AND GENITOURINARY:  Deferred.  EXTREMITIES:  There is 2+ edema of the ankles and feet.  NEUROLOGIC:  The patient is alert and oriented x3.  He is not displaying any  aphasia.  There is no asterixis or tremor.  PSYCHIATRIC:  The patient is a bit impatient and irritable but overall he is  appropriate.   LABORATORY DATA:  Labs are pending for today but labs from January 3 as  follows:  White blood cell count 15.6, hemoglobin 12.4, hematocrit 36.6, MCV  102.6, left shift with minimal increase in percentage of neutrophils,  absolute neutrophil count was 12.2, monocytes 1.3, and eosinophils 0.8.  Sodium 144, potassium 4.5, glucose 124, BUN 32, creatinine 1.8, total  bilirubin 1.8, alkaline phosphatase 258, AST 99 and ALT 38, albumin 2.7.  For comparison, labs from December 23 reveal total bilirubin of 2.3,  alkaline phosphatase of 322, AST of 153, ALT of 60.  Ferritin is 775, folate  level 2.2, B12 is 882.  TSH 3.43.  GGT level on December 23 667.  Hepatitis  B surface antigen and hepatitis C antibody were negative.  Carbohydrate  deficient transferrin was 3.2 and this is a marker which is very specific,  in fact greater than 90% specificity, for heavy alcohol use.   Alpha-  fetoprotein level was 2.5 and this is normal.  Total cholesterol 158,  triglycerides 124, HDL cholesterol 18.5, VLDL cholesterol was 25, LDL  cholesterol 115.  Ammonia level on December 23 was 58.   IMPRESSION:  1.  Alcoholic cirrhosis with probable spontaneous bacterial peritonitis.      Needs a paracentesis in order to determine if he does indeed have SBP.  2.  Chronic alcoholism, probably more active than the patient is currently      admitting to.  Need to watch for signs of alcohol withdrawal.  3.  Malnutrition and hypoalbuminemia on recent laboratory, probably owing to      poor nutritional intake.  4.  Renal insufficiency.  5.  Hyperglycemia.  Need to watch for continuation of elevated glucose      levels.  6.  Hypertrophic cardiomyopathy.  Given the last few months, question      whether this is secondary to alcohol abuse.  7.  History alcoholic gastroenteritis.  8.  Remote history of hyperplastic colon polyps.   PLAN: 1.  The patient admitted to telemetry bed.  2.  Planned for diagnostic paracentesis.  3.  Planned for upper endoscopy, hopefully tomorrow.  4.  The patient will need referral, if he will accept it, to alcohol      treatment program.       SG/MEDQ  D:  11/19/2004  T:  11/19/2004  Job:  [id number]

## 2011-03-27 NOTE — Discharge Summary (Signed)
[name], [name]             ACCOUNT NO.:  [id number]   MEDICAL RECORD NO.:  [id number]          PATIENT TYPE:  INP   LOCATION:  6729                         FACILITY:  [facility]   PHYSICIAN:  Vania Rea. Jarold Motto, M.D. [facility] OF BIRTH:  1929-07-15   DATE OF ADMISSION:  11/19/2004  DATE OF DISCHARGE:  11/21/2004                                 DISCHARGE SUMMARY   ADMISSION DIAGNOSES:  1.  Alcoholic cirrhosis.  2.  Ascites with probable spontaneous bacterial peritonitis.  3.  Chronic alcoholism with questionable recent activity, differing stories      from family versus the patient as to when he had his last intake of      alcohol.  4.  Malnutrition and hypoalbuminemia owing to liver disease and poor      nutritional intake.  5.  Renal insufficiency.  6.  Hyperglycemia, glucose intolerance.  Need to watch for onset of diabetes      mellitus type  7.  Hypertrophic cardiomyopathy.  Question as to whether this is from      alcohol abuse.  8.  History of alcoholic gastroenteritis.  9.  Remote history of hyperplastic colon polyps.  10. Hyperkalemia on recent outpatient labs at time when patient self-      administering exogenous potassium.  He no longer is taking supplemental      potassium.  11. Diarrhea, fairly well-controlled with half of an Imodium daily.   DISCHARGE DIAGNOSES:  1.  Linux cirrhosis.  2.  Ascites with early spontaneous bacterial peritonitis.  3.  Elevated TSH with normal T4 levels.  Probably sick euthyroid, but will      need followup thyroid tests in about a month.  4.  Status post pneumothorax vaccination.  The patient states he has had      prior hepatitis A and B vaccines.  5.  Alcoholism with level of recent abstinence in question.  The patient did      not have any signs of alcohol withdrawal during this admission, but he      was taking regular doses of benzodiazepines.  6.  Hypertrophic cardiomyopathy.  7.  Glucose intolerance.  8.  History of  hyperkalemia, no elevated potassium levels documented during      this admission.  9.  Hypoalbuminemia.  10. Small abdominal aortic aneurysm measuring  11. Depression.  12. Consultations with Dr. Burnice Logan from Infectious Disease.  13. Procedures:  Upper endoscopy by Dr. Yancey Flemings on 11/20/2004, this      revealed mild portal gastropathy, no varices and no ulcerative mucosal      changes.  Status post thoracentesis of 150 mL of fluid.  14. Macrocytic anemia.   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  His past medical history is stated above.  The reason for this  admission is owing to sequelae from the Linux cirrhosis and chronic abuse.  Since mid to late-December he has been seen in the office a few times by Dr.  Sheryn Bison, initially for diarrhea which is now controlled with  Imodium.  He had had a prior remote history of colon polyps and  diverticulosis on a colonoscopy some years ago.  It came to the attention of  his physicians, that [name] was drinking 1-2 bottles of wine daily and  having increasing functional difficulties and his family became alarmed.  Labs were pursued and his LFTs were elevated.  He had abdominal distention  consistent with ascites.  A CT scan of 11/04/2004 showed a moderate amount  of ascites with a heterogenic cirrhotic appearing liver with some  intermediate low density foci, some calcifications were noted in the common  iliac arteries and he had a 2.5 cm abdominal aortic aneurysm.  Scattered  ticks were also noted and a tiny left pleural effusion were also noted.   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  Thus he was taking potassium on his own and at one point his potassium  measured 6 at which point he was told to stop taking potassium which he has.  Labs in on 11/11/2004 and about a week later  revealed white blood cell count  elevation to 17.4.  He was not complaining of any abdominal pain, nor nausea  and vomiting.  However on 11/18/2004 Dr. Jarold Motto started him on Levaquin.  He also wanted him to come into the hospital for further diagnostic tests.  However the patient opted to come in the following day, and arrived in  stable condition.   Other labs of interest included a normal coags.  Elevated transaminases, nor  negative hepatitis B and C testing.  He has had prior hepatitis B  vaccination.  Details of his outpatient labs were outlined in the history  and physical for this admission and can be referred to on that dictation.   LABORATORIES:  Hemoglobin on admission was 12.2, it was 10.1 at discharge.  Hematocrit at discharge 29.3.  White blood cell count went from 14.3 to  13.1.  Differential revealed a left shift.  Pro time 14.7, INR 1.2, PTT of  39.  Sodium 136, potassium 4.4, chloride 103, CO2 30, glucose measured  anywhere from 152 on admission to 87 at discharge.  Total bilirubin 1.9,  alkaline phosphatase 205, AST 95, ALT 31, total albumin 2.6 and total  protein 7.2, calcium 8.7, TSH level 12.511, thyroxine/T4 level 9.7, acidic  fluid with a glucose level of 111, total protein less than 3, albumin 1.1,  segmented acidic cell count showed a white blood cell count of 1050,  segmented neutrophils of 56, lymphocytes of 27, monocytes of 15 and  eosinophils of 2.  Acidic cytology showed benign mesothelial cells and no  malignant cells.  Two-view chest showed prominent elevation of the right  hemidiaphragm, ultrasound with paracentesis of 150 mL of straw-colored  fluid.   HOSPITAL COURSE:  1.  Ascites, early SBP.  The patient underwent diagnostic paracentesis and      indeed the cell count confirmed a diagnosis of SBP.  We had Dr. Roxan Hockey      see the patient and he felt that this was probably an early SBP, as the     patient had not developed any symptoms of abdominal  pain or fevers and      had simply had the white blood cell count which alerted [place] to the SBP.      The patient initially was treated with Rocephin, but since the patient  was stable and wanted to go home, completion of antibiotic therapy with      Avelox was felt to optimal by Dr. Roxan Hockey.  He was to complete a five-      day course of total antibiotics so went home with a three-day supply of      Avelox.  2.  Alcoholism.  The patient did not show any signs of alcohol withdrawal.      LFTs were elevated, but generally decreased compared with prior readings      in December and earlier in January.  The patient states that he has not      had anything to drink alcohol-wise for three months, but apparently the      family has stated that he may be drinking more actively than that.  In      any event, he was counseled about going to [employer] and to try      several different meetings.  He was given references for access to AA      meetings by Dr. Roxan Hockey.  It is noted from the outpatient records that      the patient has had family members and friends offer help in terms of      his alcohol abuse, but the patient has been resistant to these      overtures.  3.  Diarrhea.  As noted the patient had diarrhea earlier in December and      that had been controlled with some Imodium.  However because of some      mental status changes and confusion that he had been experiencing as an      outpatient, he had been started on some Lactulose here in the hospital.      This led to more diarrhea.  The patient requested calls to the on-call      doctors at night and he was given increased doses of Imodium.  The      patient was instructed that the goal of Lactulose was for him to have 3-      5 bowel movements daily and not to use Imodium if he could help it.      However, again this patient does self-treat and it is questionable as to      how compliant he will be with the Lactulose given his low  threshold for      diarrhea.  4.  Elevated TSH.  This was evaluated by Dr. Felicity Coyer.  His T4 was normal,      she felt that he was sick euthyroid and that he should have followup      outpatient testing of his thyroid tests in one month.  5.  Hyperglycemia.  Initially his sugars measured   Dictation ended at this point.       SG/MEDQ  D:  11/21/2004  T:  11/21/2004  Job:  [id number]   cc:   Rosalyn Gess. Norins, M.D. [facility]

## 2011-04-14 ENCOUNTER — Encounter (INDEPENDENT_AMBULATORY_CARE_PROVIDER_SITE_OTHER): Payer: Self-pay | Admitting: General Surgery

## 2011-04-17 ENCOUNTER — Telehealth: Payer: Self-pay | Admitting: Cardiology

## 2011-04-17 NOTE — Telephone Encounter (Signed)
Dr. Alessandra Grout would like to speak with dr Riley Kill or lauren re his condition.

## 2011-04-17 NOTE — Telephone Encounter (Signed)
See message.

## 2011-04-20 ENCOUNTER — Telehealth: Payer: Self-pay | Admitting: *Deleted

## 2011-04-20 MED ORDER — ZOLPIDEM TARTRATE 10 MG PO TABS: 10.0000 mg | ORAL_TABLET | Freq: Every evening | ORAL | Status: DC | PRN

## 2011-04-20 NOTE — Telephone Encounter (Signed)
Fax from [facility] re: Zolpidem tartrate 10 mg tablet. SIG: take one tablet at bedtime as needed Last fill date was 03/23/2011. Last OV was 10/10/2010 Please Advise refills

## 2011-04-20 NOTE — Telephone Encounter (Signed)
k prn

## 2011-04-23 NOTE — Telephone Encounter (Signed)
I left a message for the pt to call back and schedule an appointment with Dr Riley Kill on 04/30/11.  The pt should be added to the end of the schedule.

## 2011-04-23 NOTE — Telephone Encounter (Signed)
I spoke with the pt and scheduled him to see Dr Riley Kill on 04/30/11.

## 2011-04-23 NOTE — Telephone Encounter (Signed)
Dr Riley Kill called and spoke with this patient and would like the pt added onto his schedule next Thursday.

## 2011-04-27 ENCOUNTER — Ambulatory Visit: Payer: Medicare Other | Admitting: Hematology & Oncology

## 2011-04-28 ENCOUNTER — Encounter: Payer: Self-pay | Admitting: Physician Assistant

## 2011-04-28 ENCOUNTER — Ambulatory Visit: Payer: Medicare Other | Admitting: Hematology & Oncology

## 2011-04-29 ENCOUNTER — Other Ambulatory Visit: Payer: Self-pay | Admitting: *Deleted

## 2011-04-29 MED ORDER — METOPROLOL SUCCINATE ER 100 MG PO TB24: ORAL_TABLET | ORAL | Status: DC

## 2011-04-30 ENCOUNTER — Encounter: Payer: Self-pay | Admitting: Cardiology

## 2011-04-30 ENCOUNTER — Ambulatory Visit (INDEPENDENT_AMBULATORY_CARE_PROVIDER_SITE_OTHER): Payer: Medicare Other | Admitting: Cardiology

## 2011-04-30 DIAGNOSIS — Z136 Encounter for screening for cardiovascular disorders: Secondary | ICD-10-CM

## 2011-04-30 DIAGNOSIS — R609 Edema, unspecified: Secondary | ICD-10-CM

## 2011-04-30 DIAGNOSIS — I10 Essential (primary) hypertension: Secondary | ICD-10-CM

## 2011-04-30 DIAGNOSIS — R0609 Other forms of dyspnea: Secondary | ICD-10-CM

## 2011-04-30 DIAGNOSIS — E785 Hyperlipidemia, unspecified: Secondary | ICD-10-CM

## 2011-04-30 DIAGNOSIS — I421 Obstructive hypertrophic cardiomyopathy: Secondary | ICD-10-CM

## 2011-04-30 DIAGNOSIS — I4891 Unspecified atrial fibrillation: Secondary | ICD-10-CM

## 2011-04-30 DIAGNOSIS — R0600 Dyspnea, unspecified: Secondary | ICD-10-CM

## 2011-04-30 DIAGNOSIS — F1010 Alcohol abuse, uncomplicated: Secondary | ICD-10-CM

## 2011-04-30 LAB — BASIC METABOLIC PANEL
GFR: 46.44 mL/min — ABNORMAL LOW (ref >60.00)
Potassium: 4.7 mEq/L (ref 3.5–5.1)
Sodium: 133 mEq/L — ABNORMAL LOW (ref 135–145)

## 2011-04-30 LAB — HEPATIC FUNCTION PANEL
AST: 22 U/L (ref 0–37)
Alkaline Phosphatase: 79 U/L (ref 39–117)
Bilirubin, Direct: 0.2 mg/dL (ref 0.0–0.3)
Total Bilirubin: 0.5 mg/dL (ref 0.3–1.2)

## 2011-04-30 LAB — CBC WITH DIFFERENTIAL/PLATELET
Eosinophils Absolute: 0.2 10*3/uL (ref 0.0–0.7)
MCHC: 34.4 g/dL (ref 30.0–36.0)
MCV: 95.3 fl (ref 78.0–100.0)
Monocytes Absolute: 1.1 10*3/uL — ABNORMAL HIGH (ref 0.1–1.0)
Neutrophils Relative %: 78.5 % — ABNORMAL HIGH (ref 43.0–77.0)
Platelets: 245 10*3/uL (ref 150.0–400.0)
WBC: 10.6 10*3/uL — ABNORMAL HIGH (ref 4.5–10.5)

## 2011-04-30 NOTE — Patient Instructions (Signed)
Your physician recommends that you schedule a follow-up appointment in: 1 month with Dr. Riley Kill. Your physician has requested that you have a lower  extremity venous duplex. This test is an ultrasound of the veins in the legs or arms. It looks at venous blood flow that carries blood from the heart to the legs or arms. Allow one hour for a Lower Venous exam. Allow thirty minutes for an Upper Venous exam. There are no restrictions or special instructions. You had lab work done today.

## 2011-05-06 ENCOUNTER — Ambulatory Visit (INDEPENDENT_AMBULATORY_CARE_PROVIDER_SITE_OTHER): Payer: Medicare Other | Admitting: General Surgery

## 2011-05-06 ENCOUNTER — Encounter (INDEPENDENT_AMBULATORY_CARE_PROVIDER_SITE_OTHER): Payer: Self-pay | Admitting: General Surgery

## 2011-05-06 VITALS — Temp 96.8°F

## 2011-05-06 DIAGNOSIS — C4370 Malignant melanoma of unspecified lower limb, including hip: Secondary | ICD-10-CM

## 2011-05-06 NOTE — Progress Notes (Signed)
Subjective:     Patient ID: [name], male   DOB: 12-26-1928, 77 y.o.   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 The central portion of his wound has been healing by secondary intention. On his last visit granulation tissue was treated with silver nitrate. He's had no pain or complaints.  Review of Systems     Objective:   Physical Exam On examination the wound is at the skin level on his left anterior thigh there is some heaped up granulation tissue. There is no evidence of infection. There is no significant drainage the the granulation tissue was treated locally with silver nitrate.    Assessment:      Doing well after wide excision melanoma left anterior thigh. Plan:        Return in 2 weeks for followup. I gave the patient's and silver nitrate sticks. Apply them as directed for the excess granulation tissue.

## 2011-05-13 NOTE — Assessment & Plan Note (Signed)
LDLs are pretty reasonable, climbing a bit, but last was 75.

## 2011-05-13 NOTE — Assessment & Plan Note (Signed)
No clear cut symptoms at this point in time. Continue to follow.

## 2011-05-13 NOTE — Assessment & Plan Note (Signed)
Appears to be stable.  

## 2011-05-13 NOTE — Progress Notes (Signed)
HPI:  [name] has been a patient of Dr. Juanda Chance.  I am seeing him in follow up at his request.  He has had a recent melanoma excision. He has had some mild edema.  He attends AA 4 days per week, and drinks three drinks per week, but has not disclosed that to his wife.  His problems are as noted. His melanoma excision was extensive, with an 11 by 6 cm left thigh excision.    Current Outpatient Prescriptions  Medication Sig Dispense Refill  . amLODipine (NORVASC) 10 MG tablet Take 10 mg by mouth daily.        Marland Kitchen aspirin 325 MG EC tablet Take 162.5 mg by mouth daily.        . furosemide (LASIX) 20 MG tablet Take 2 tablets (40 mg total) by mouth daily.  90 tablet  3  . metoprolol (TOPROL-XL) 100 MG 24 hr tablet 1 1/2 tab po qd  45 tablet  12  . omeprazole (PRILOSEC) 40 MG capsule Take 40 mg by mouth daily.        . simvastatin (ZOCOR) 20 MG tablet Take 20 mg by mouth at bedtime.        Marland Kitchen spironolactone (ALDACTONE) 25 MG tablet Take 25 mg by mouth daily.        Marland Kitchen zolpidem (AMBIEN) 10 MG tablet Take 1 tablet (10 mg total) by mouth at bedtime as needed.  30 tablet  1    No Known Allergies  Past Medical History  Diagnosis Date  . Hypertrophic cardiomyopathy     a.  echo 8/10: EF 55-65%, basal septal hypertrophy and small LVOT but no sig. gradient and no SAM, mod LVH, mild AI, mild LAE (IVS ED 13.6 mm);    b.  echo 4/12: EF  55-65%, mild LVH, Gr 1 diast dysfxn, very mild AS, mod AI,   . HTN (hypertension)   . HLD (hyperlipidemia)   . Alcoholic cirrhosis   . GERD (gastroesophageal reflux disease)   . Renal insufficiency   . Alcoholism     Past Surgical History  Procedure Date  . Inguinal hernia repair     Family History  Problem Relation Age of Onset  . Colon cancer Father     History   Social History  . Marital Status: Married    Spouse Name: N/A    Number of Children: N/A  . Years of Education: N/A   Occupational History  . Not on file.   Social History Main Topics  . Smoking  status: Never Smoker   . Smokeless tobacco: Not on file  . Alcohol Use: 1.5 - 2.0 oz/week    3-4 drink(s) per week     member of AA  . Drug Use: Not on file  . Sexually Active: Not on file   Other Topics Concern  . Not on file   Social History Narrative  . No narrative on file    ROS: Please see the HPI.  All other systems reviewed and negative.  PHYSICAL EXAM:  BP 116/58  Pulse 69  Resp 18  Ht 5\' 10"  (1.778 m)  Wt 170 lb (77.111 kg)  BMI 24.39 kg/m2  General: Well developed, well nourished, in no acute distress. Head:  Normocephalic and atraumatic. Neck: no JVD Lungs: Clear to auscultation and percussion. Heart: Normal S1 and S2.  SEM about 2/6 with early diastolic component Abdomen:  Normal bowel sounds; soft; non tender; no organomegaly Pulses: Pulses normal in all 4 extremities. Extremities:trace  edema.  Surgical site.  Neurologic: Alert and oriented x 3.  EKG:  NSR.  First degree av block.  Minor nonspecific ST changes. ASSESSMENT AND PLAN:

## 2011-05-13 NOTE — Assessment & Plan Note (Signed)
Remains in AA.  Three drinks per week.  Discussed.

## 2011-05-15 ENCOUNTER — Encounter (INDEPENDENT_AMBULATORY_CARE_PROVIDER_SITE_OTHER): Payer: Medicare Other | Admitting: *Deleted

## 2011-05-15 DIAGNOSIS — R609 Edema, unspecified: Secondary | ICD-10-CM

## 2011-05-18 ENCOUNTER — Encounter: Payer: Self-pay | Admitting: Cardiology

## 2011-05-20 ENCOUNTER — Ambulatory Visit (INDEPENDENT_AMBULATORY_CARE_PROVIDER_SITE_OTHER): Payer: Medicare Other | Admitting: General Surgery

## 2011-05-20 DIAGNOSIS — C4370 Malignant melanoma of unspecified lower limb, including hip: Secondary | ICD-10-CM

## 2011-05-20 DIAGNOSIS — D0370 Melanoma in situ of unspecified lower limb, including hip: Secondary | ICD-10-CM

## 2011-05-20 NOTE — Progress Notes (Signed)
Subjective:     Patient ID: [name], male   DOB: [birth year], 77 y.o.   MRN: [id number]    There were no vitals taken for this visit.    HPIPatient presents for followup status post wide excision melanoma left anterior thigh. Patient has been doing well. He has not needed to apply very much silver nitrate.   Review of Systems     Objective:   Physical Exam On exam his wound Is almost completely epithelialized. There is no evidence of infection. Groin exam reveals small likely seroma at incision site with no evidence of infection.    Assessment:     Doing well status post wide excision melanoma left anterior thigh.    Plan:    Return to the office in approximately 8 weeks. Patient was referred to oncology but has to side not to pursue that referral.

## 2011-05-25 ENCOUNTER — Other Ambulatory Visit: Payer: Self-pay | Admitting: Cardiology

## 2011-05-25 ENCOUNTER — Ambulatory Visit: Payer: Medicare Other | Admitting: Physician Assistant

## 2011-05-25 ENCOUNTER — Encounter: Payer: Self-pay | Admitting: Cardiology

## 2011-05-25 ENCOUNTER — Ambulatory Visit (INDEPENDENT_AMBULATORY_CARE_PROVIDER_SITE_OTHER): Payer: Medicare Other | Admitting: Cardiology

## 2011-05-25 ENCOUNTER — Telehealth: Payer: Self-pay | Admitting: Cardiology

## 2011-05-25 DIAGNOSIS — R195 Other fecal abnormalities: Secondary | ICD-10-CM

## 2011-05-25 DIAGNOSIS — F1010 Alcohol abuse, uncomplicated: Secondary | ICD-10-CM

## 2011-05-25 DIAGNOSIS — I421 Obstructive hypertrophic cardiomyopathy: Secondary | ICD-10-CM

## 2011-05-25 DIAGNOSIS — R609 Edema, unspecified: Secondary | ICD-10-CM

## 2011-05-25 LAB — CBC WITH DIFFERENTIAL/PLATELET
Eosinophils Absolute: 0.3 10*3/uL (ref 0.0–0.7)
HCT: 35.4 % — ABNORMAL LOW (ref 39.0–52.0)
Lymphocytes Relative: 11 % — ABNORMAL LOW (ref 12–46)
Lymphs Abs: 1.2 10*3/uL (ref 0.7–4.0)
MCHC: 33.3 g/dL (ref 30.0–36.0)
MCV: 93.9 fL (ref 78.0–100.0)
Monocytes Relative: 12 % (ref 3–12)
RDW: 12.6 % (ref 11.5–15.5)

## 2011-05-25 NOTE — Telephone Encounter (Signed)
Patient aware of test results.

## 2011-05-25 NOTE — Assessment & Plan Note (Signed)
Still using

## 2011-05-25 NOTE — Assessment & Plan Note (Addendum)
Doppler neg for DVT.  Has chronic swelling, left greater than right.

## 2011-05-25 NOTE — Telephone Encounter (Signed)
Dr Rica Records would like to speak with a nurse re his blood work results.

## 2011-05-25 NOTE — Progress Notes (Signed)
HPI:  [name] comes in as we had planned.  His doppler was negative, but there was a mass, but this is from his surgery recently with a left inguinal collection.  He says that he has been having dark stools for a bit and thinks he has a gastric ulcer.  Denies belly pain.  I am trying to get him to the GI team, and Dr. Russella Dar will see him, but the patient refuses to go until this pm  They are to call his house to arrange a time.  SOB has slowly progressed.    Current Outpatient Prescriptions  Medication Sig Dispense Refill  . amLODipine (NORVASC) 10 MG tablet Take 10 mg by mouth daily.        Marland Kitchen aspirin 325 MG EC tablet Take 162.5 mg by mouth daily.        . furosemide (LASIX) 20 MG tablet Take 2 tablets (40 mg total) by mouth daily.  90 tablet  3  . metoprolol (TOPROL-XL) 100 MG 24 hr tablet 1 1/2 tab po qd  45 tablet  12  . omeprazole (PRILOSEC) 40 MG capsule Take 40 mg by mouth daily.        . simvastatin (ZOCOR) 20 MG tablet Take 20 mg by mouth at bedtime.        Marland Kitchen spironolactone (ALDACTONE) 25 MG tablet Take 25 mg by mouth daily.        Marland Kitchen zolpidem (AMBIEN) 10 MG tablet Take 1 tablet (10 mg total) by mouth at bedtime as needed.  30 tablet  1    No Known Allergies  Past Medical History  Diagnosis Date  . Hypertrophic cardiomyopathy     a.  echo 8/10: EF 55-65%, basal septal hypertrophy and small LVOT but no sig. gradient and no SAM, mod LVH, mild AI, mild LAE (IVS ED 13.6 mm);    b.  echo 4/12: EF  55-65%, mild LVH, Gr 1 diast dysfxn, very mild AS, mod AI,   . HTN (hypertension)   . HLD (hyperlipidemia)   . Alcoholic cirrhosis   . GERD (gastroesophageal reflux disease)   . Renal insufficiency   . Alcoholism     Past Surgical History  Procedure Date  . Inguinal hernia repair     Family History  Problem Relation Age of Onset  . Colon cancer Father     History   Social History  . Marital Status: Married    Spouse Name: N/A    Number of Children: N/A  . Years of Education: N/A    Occupational History  . Not on file.   Social History Main Topics  . Smoking status: Never Smoker   . Smokeless tobacco: Not on file  . Alcohol Use: 1.5 - 2.0 oz/week    3-4 drink(s) per week     member of AA  . Drug Use: Not on file  . Sexually Active: Not on file   Other Topics Concern  . Not on file   Social History Narrative  . No narrative on file    ROS: Please see the HPI.  All other systems reviewed and negative.  PHYSICAL EXAM:  BP 110/61  Pulse 74  Ht 5\' 10"  (1.778 m)  Wt 174 lb 12.8 oz (79.289 kg)  BMI 25.08 kg/m2  General: Well developed, well nourished, in no acute distress. Head:  Normocephalic and atraumatic. Neck: no JVD Lungs: Clear to auscultation and percussion. Heart: Normal S1 and S2.  No murmur, rubs or gallops.  Abdomen:  Normal bowel sounds; soft; non tender; no organomegaly Pulses: Pulses normal in all 4 extremities. Extremities: No clubbing or cyanosis. Bilateral LE edema, slightly worse on left.  No erythema.  Left groin soft fluid collection at surgical site.   Rectal:  No masses, strongly pos heme stool.   Neurologic: Alert and oriented x 3.  EKG:  NSR.  RBBB.  T wave inversion inferiorly. ASSESSMENT AND PLAN:

## 2011-05-25 NOTE — Assessment & Plan Note (Signed)
Stable.  See echo report in chart.

## 2011-05-25 NOTE — Telephone Encounter (Signed)
Spoke with patient's wife to confirm appointment tomorrow for [name] with Dr. Jarold Motto. He forgot his appointment today. I also confirmed his blood work results with her since Dr. Riley Kill had spoken with the patient regarding his CBC and Mr. Eland called back an hour later denying that he spoke with Dr. Riley Kill and needed his blood work results. I went over the results with him again.

## 2011-05-25 NOTE — Telephone Encounter (Signed)
lmtcb

## 2011-05-25 NOTE — Assessment & Plan Note (Addendum)
Strongly pos.  Last CBC mid 12.6  , but with ETOH.  Normocytic.  Dr. Russella Dar will see. Stat CBC pending.  We made an appointment for him today, and gave him the time, and later he said he did not have the time and forgot.  We rearranged for tomorrow am, and I called his wife to confirm time and plan.  We also called him with his CBC, and he called back shortly thereafter to find out the result.  This was all relayed to his spouse.  She will have him to see GI clinic tomorrow.   I mentioned stopping ASA, and he felt he did not need to do that--"took it for years".  I mentioned the grossly heme pos stool.

## 2011-05-26 ENCOUNTER — Encounter: Payer: Self-pay | Admitting: Gastroenterology

## 2011-05-26 ENCOUNTER — Ambulatory Visit (INDEPENDENT_AMBULATORY_CARE_PROVIDER_SITE_OTHER): Payer: Medicare Other | Admitting: Gastroenterology

## 2011-05-26 ENCOUNTER — Other Ambulatory Visit (INDEPENDENT_AMBULATORY_CARE_PROVIDER_SITE_OTHER): Payer: Medicare Other

## 2011-05-26 VITALS — BP 110/56 | HR 84 | Ht 70.0 in | Wt 170.2 lb

## 2011-05-26 DIAGNOSIS — I10 Essential (primary) hypertension: Secondary | ICD-10-CM

## 2011-05-26 DIAGNOSIS — K7030 Alcoholic cirrhosis of liver without ascites: Secondary | ICD-10-CM

## 2011-05-26 DIAGNOSIS — E785 Hyperlipidemia, unspecified: Secondary | ICD-10-CM

## 2011-05-26 DIAGNOSIS — R195 Other fecal abnormalities: Secondary | ICD-10-CM

## 2011-05-26 DIAGNOSIS — R600 Localized edema: Secondary | ICD-10-CM

## 2011-05-26 DIAGNOSIS — K921 Melena: Secondary | ICD-10-CM

## 2011-05-26 DIAGNOSIS — R609 Edema, unspecified: Secondary | ICD-10-CM

## 2011-05-26 DIAGNOSIS — Z79899 Other long term (current) drug therapy: Secondary | ICD-10-CM

## 2011-05-26 DIAGNOSIS — K319 Disease of stomach and duodenum, unspecified: Secondary | ICD-10-CM

## 2011-05-26 LAB — BASIC METABOLIC PANEL
CO2: 29 mEq/L (ref 19–32)
Calcium: 8.7 mg/dL (ref 8.4–10.5)
GFR: 36.88 mL/min — ABNORMAL LOW (ref >60.00)
Glucose, Bld: 118 mg/dL — ABNORMAL HIGH (ref 70–99)
Potassium: 4.7 mEq/L (ref 3.5–5.1)
Sodium: 140 mEq/L (ref 135–145)

## 2011-05-26 LAB — CBC WITH DIFFERENTIAL/PLATELET
Basophils Absolute: 0 10*3/uL (ref 0.0–0.1)
Basophils Relative: 0.4 % (ref 0.0–3.0)
Eosinophils Absolute: 0.1 10*3/uL (ref 0.0–0.7)
Hemoglobin: 11.7 g/dL — ABNORMAL LOW (ref 13.0–17.0)
Lymphs Abs: 0.8 10*3/uL (ref 0.7–4.0)
MCHC: 33.8 g/dL (ref 30.0–36.0)
MCV: 94.8 fl (ref 78.0–100.0)
Monocytes Absolute: 1 10*3/uL (ref 0.1–1.0)
Neutro Abs: 7.8 10*3/uL — ABNORMAL HIGH (ref 1.4–7.7)
RBC: 3.64 Mil/uL — ABNORMAL LOW (ref 4.22–5.81)
RDW: 13.1 % (ref 11.5–14.6)

## 2011-05-26 LAB — HEPATIC FUNCTION PANEL
ALT: 13 U/L (ref 0–53)
Alkaline Phosphatase: 81 U/L (ref 39–117)
Bilirubin, Direct: 0.2 mg/dL (ref 0.0–0.3)
Total Bilirubin: 0.9 mg/dL (ref 0.3–1.2)

## 2011-05-26 LAB — AFP TUMOR MARKER: AFP-Tumor Marker: 2.8 ng/mL (ref 0.0–8.0)

## 2011-05-26 LAB — AMMONIA: Ammonia: 26 umol/L (ref 11–35)

## 2011-05-26 MED ORDER — SUCRALFATE 1 GM/10ML PO SUSP: 1.0000 g | Freq: Four times a day (QID) | ORAL | Status: DC | PRN

## 2011-05-26 MED ORDER — DEXLANSOPRAZOLE 60 MG PO CPDR: 60.0000 mg | DELAYED_RELEASE_CAPSULE | Freq: Every day | ORAL | Status: DC

## 2011-05-26 NOTE — Patient Instructions (Signed)
Start Dexilant 60mg  take one a day in place of the Prilosec. Samples given and rx sent to your pharmacy. Your prescription(s) have been sent to you pharmacy.  Please make an appt to come back and see Dr Jarold Motto in 1 month. Your abdominal ultrasound is scheduled for 05/29/2011 at [facility], Please arrive at 8:45am and have nothing to eat or drink after midnight.  Please go to the basement today for your labs.

## 2011-05-26 NOTE — Progress Notes (Signed)
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 I have not seen him in 5 years, but it is mostly on the care of Dr. Shawnie Pons and cardiology. He has hypertensive cardiovascular disease, mild CHF, chronic anemia, and continued alcohol abuse. Last endoscopic exam was with Dr. Yancey Flemings in January of 2006 at which time he had portal gastropathy. He now presents with a history of melanotic stool over several days 2 weeks ago without nausea and vomiting, rectal bleeding, or any specific hepatobiliary complaints. He denies use of Pepto-Bismol or iron supplements. No history of NSAID abuse, but he does take daily aspirin 325 mg, and Aldactone 50 mg a day for peripheral edema, Prilosec 40 mg a day for GERD, and  Norvasc 10 mg a day. He allegedly uses" 3 vodkas a week" but readily admits that he uses much more alcohol which he thinks is" the big problem.". However, he denies mental status changes, hematemesis, or uncontrolled drinking. He previously had had alcohol-induced coma with associated severe muscle tissue damage and mild renal failure. This occurred several years ago, and I have not seen him in several years. Primary care physician is Dr. Joyice Faster, but I do not think he sees him regularly. He alleges he had a negative colonoscopy within the last 10 years. I cannot find that report for review.   I have reviewed this patient's present history, medical and surgical past history, allergies and medications.     ROS: The remainder of the 10 point ROS is negative... he denies cardiovascular issues such as palpitations, shortness of breath or chest pain. He has had a cardiac murmur for many years. He recently has had a melanoma removed from his left thigh  area by Dr. Violeta Gelinas. Other medications include Toprol-XL 150 mg a day.  Past Medical History  Diagnosis Date  . Hypertrophic cardiomyopathy     a.  echo 8/10: EF 55-65%, basal septal hypertrophy and small LVOT but no sig. gradient and no SAM, mod LVH, mild AI, mild LAE (IVS ED 13.6 mm);    b.  echo 4/12: EF  55-65%, mild LVH, Gr 1 diast dysfxn, very mild AS, mod AI,   . HTN (hypertension)   . HLD (hyperlipidemia)   . Alcoholic cirrhosis   . GERD (gastroesophageal reflux disease)   . Renal insufficiency   . Alcoholism    Past Surgical History  Procedure Date  . Inguinal hernia repair     reports that he has never smoked. He has never used smokeless tobacco. He reports that he drinks about 1.5 - 2 ounces of alcohol per week. He reports that he does not use illicit drugs. family history includes Colon cancer in his father and Heart disease in his mother. No Known Allergies      Physical Exam: General well developed well nourished patient in no acute distress, appearing older than stated age Eyes PERRLA, no icterus, fundoscopic exam per opthamologist Skin no lesions noted...papular rash over the facial area suggestive of  roscea. Neck supple, no adenopathy, no thyroid enlargement, no tenderness Chest clear to percussion and auscultation Cardiac: 2/6 systolic ejection murmur noted without clicks or murmurs or rubs otherwise. He appears to be in a regular rhythm.  Abdomen: There is no hepatomegaly but the spleen is palpable in the left upper quadrant. There  is no ascites, abdominal masses or tenderness.  Extremities no acute joint lesions, edema, phlebitis or evidence of cellulitis. +1 peripheral edema noted left leg greater than right without evidence of phlebitis or swollen joints.  Neurologic patient oriented x 3, cranial nerves intact, no focal neurologic deficits noted. no asterixis noted in his memory appears entirely normal.  Psychological mental status normal and normal  affect.  Assessment and plan: Child's a alcoholic cirrhosis with ongoing alcohol abuse, known portal gastropathy, and recent low-grade GI bleed. The patient has refused followup endoscopic exam to assess the status of his portal gastropathy and to rule out varices. I have counseled him again about complete alcohol abstinence, have changed him to Dexilant 60 mg a day in place of Prilosec, have urged when necessary Carafate suspension as needed, avoidance of NSAIDs other than aspirin, and we'll check labs including prothrombin time, CBC, anemia profile, ammonia level, alpha-fetoprotein, and  CDT level to assess his extent of alcohol abuse. Upper abdominal ultrasound exam also has been scheduled with office followup in one month's time. Hopefully, his compliance will improve with our new relationship. The results of his recent melanoma excision apparently are still pending. He appears stable from a cardiovascular standpoint, and otherwise is to continue his medications as listed. Again, alcohol discontinuation as the main determinant of this patient's long term health. He has always been resistant to formal alcohol inpatient rehabilitation.      Please copy her primary care physician, referring physician, and pertinent subspecialists.  Encounter Diagnoses  Name Primary?  . Heme positive stool Yes  . Alcoholic cirrhosis

## 2011-05-27 LAB — FERRITIN: Ferritin: 16.4 ng/mL — ABNORMAL LOW (ref 22.0–322.0)

## 2011-05-29 ENCOUNTER — Ambulatory Visit (HOSPITAL_COMMUNITY)
Admission: RE | Admit: 2011-05-29 | Discharge: 2011-05-29 | Disposition: A | Discharge status: Home / Self Care | Payer: Medicare Other | Source: Ambulatory Visit | Attending: Gastroenterology | Admitting: Gastroenterology

## 2011-05-29 DIAGNOSIS — K7460 Unspecified cirrhosis of liver: Secondary | ICD-10-CM | POA: Insufficient documentation

## 2011-05-29 DIAGNOSIS — R195 Other fecal abnormalities: Secondary | ICD-10-CM

## 2011-05-29 DIAGNOSIS — K7030 Alcoholic cirrhosis of liver without ascites: Secondary | ICD-10-CM

## 2011-05-29 LAB — CARBOHYDRATE DEFICIENT TRANSFERRIN: CDT: 1.9 % — ABNORMAL HIGH (ref <=1.6)

## 2011-06-01 ENCOUNTER — Encounter: Payer: Self-pay | Admitting: Cardiology

## 2011-06-05 ENCOUNTER — Encounter: Payer: Self-pay | Admitting: Cardiology

## 2011-06-05 ENCOUNTER — Ambulatory Visit (INDEPENDENT_AMBULATORY_CARE_PROVIDER_SITE_OTHER): Payer: Medicare Other | Admitting: Cardiology

## 2011-06-05 DIAGNOSIS — R609 Edema, unspecified: Secondary | ICD-10-CM

## 2011-06-05 DIAGNOSIS — R600 Localized edema: Secondary | ICD-10-CM

## 2011-06-05 DIAGNOSIS — I421 Obstructive hypertrophic cardiomyopathy: Secondary | ICD-10-CM

## 2011-06-05 DIAGNOSIS — R195 Other fecal abnormalities: Secondary | ICD-10-CM

## 2011-06-05 DIAGNOSIS — D649 Anemia, unspecified: Secondary | ICD-10-CM

## 2011-06-05 DIAGNOSIS — I422 Other hypertrophic cardiomyopathy: Secondary | ICD-10-CM

## 2011-06-05 LAB — CBC WITH DIFFERENTIAL/PLATELET
Basophils Relative: 0.6 % (ref 0.0–3.0)
Eosinophils Absolute: 0.2 10*3/uL (ref 0.0–0.7)
Eosinophils Relative: 1.8 % (ref 0.0–5.0)
Hemoglobin: 11.9 g/dL — ABNORMAL LOW (ref 13.0–17.0)
Lymphocytes Relative: 11 % — ABNORMAL LOW (ref 12.0–46.0)
MCHC: 33.5 g/dL (ref 30.0–36.0)
MCV: 93.2 fl (ref 78.0–100.0)
Neutro Abs: 9.1 10*3/uL — ABNORMAL HIGH (ref 1.4–7.7)
RBC: 3.82 Mil/uL — ABNORMAL LOW (ref 4.22–5.81)
WBC: 11.7 10*3/uL — ABNORMAL HIGH (ref 4.5–10.5)

## 2011-06-05 LAB — BASIC METABOLIC PANEL
BUN: 29 mg/dL — ABNORMAL HIGH (ref 6–23)
Chloride: 110 mEq/L (ref 96–112)
Creatinine, Ser: 1.8 mg/dL — ABNORMAL HIGH (ref 0.4–1.5)
Glucose, Bld: 105 mg/dL — ABNORMAL HIGH (ref 70–99)

## 2011-06-05 NOTE — Progress Notes (Signed)
HPI:  [name] is in for follow up.  He recently saw Cheryln Manly in the office. He had heme pos stool.  Not clear what was done.  The patient had an ultrasound and I gave him a copy of the report today.  He says that a day or two later the stools stopped, and they have been brown ever since.  Denies ongoing chest pain or other cardiac symptoms.  His recently lab was abnormal.   We had him return today to reassess his status.  He saw Dr. Jarold Motto.  He has continued his ASA despite my instructions to stop---he was told to take it many years ago for cardioprotective effects.  Since the pos stool began, he has continued about two drinks per week by his history---and attends AA about four times per week.    Current Outpatient Prescriptions  Medication Sig Dispense Refill  . amLODipine (NORVASC) 10 MG tablet Take 10 mg by mouth daily.        Marland Kitchen aspirin 325 MG EC tablet Take 162.5 mg by mouth daily.        Marland Kitchen dexlansoprazole (DEXILANT) 60 MG capsule Take 1 capsule (60 mg total) by mouth daily.  30 capsule  6  . furosemide (LASIX) 20 MG tablet Take 2 tablets (40 mg total) by mouth daily.  90 tablet  3  . metoprolol (TOPROL-XL) 100 MG 24 hr tablet 1 1/2 tab po qd  45 tablet  12  . simvastatin (ZOCOR) 20 MG tablet Take 20 mg by mouth at bedtime.        Marland Kitchen spironolactone (ALDACTONE) 25 MG tablet Take 25 mg by mouth daily.        . sucralfate (CARAFATE) 1 GM/10ML suspension Take 10 mLs (1 g total) by mouth 4 (four) times daily as needed.  1200 mL  1  . zolpidem (AMBIEN) 10 MG tablet Take 1 tablet (10 mg total) by mouth at bedtime as needed.  30 tablet  1    No Known Allergies  Past Medical History  Diagnosis Date  . Hypertrophic cardiomyopathy     a.  echo 8/10: EF 55-65%, basal septal hypertrophy and small LVOT but no sig. gradient and no SAM, mod LVH, mild AI, mild LAE (IVS ED 13.6 mm);    b.  echo 4/12: EF  55-65%, mild LVH, Gr 1 diast dysfxn, very mild AS, mod AI,   . HTN (hypertension)   . HLD  (hyperlipidemia)   . Alcoholic cirrhosis   . GERD (gastroesophageal reflux disease)   . Renal insufficiency   . Alcoholism     Past Surgical History  Procedure Date  . Inguinal hernia repair     Family History  Problem Relation Age of Onset  . Colon cancer Father   . Heart disease Mother     History   Social History  . Marital Status: Married    Spouse Name: N/A    Number of Children: 4  . Years of Education: N/A   Occupational History  . Retired    Social History Main Topics  . Smoking status: Current Everyday Smoker  . Smokeless tobacco: Never Used  . Alcohol Use: 1.5 - 2.0 oz/week    3-4 drink(s) per week     member of AA  . Drug Use: No  . Sexually Active: Not on file   Other Topics Concern  . Not on file   Social History Narrative  . No narrative on file    ROS: Please  see the HPI.  All other systems reviewed and negative.  PHYSICAL EXAM:  BP 110/58  Pulse 80  Resp 18  Ht 5\' 10"  (1.778 m)  Wt 165 lb (74.844 kg)  BMI 23.68 kg/m2  General: Well developed, well nourished, in no acute distress. Head:  Normocephalic and atraumatic. Neck: no JVD Lungs: Clear to auscultation and percussion. Heart: Normal S1 and S2. 1-2/6 SEM.  No DM.  Abdomen:  Normal bowel sounds; soft; non tender.  Question spleen tip.   Pulses: Pulses normal in all 4 extremities. Extremities: No clubbing or cyanosis. One to two plus edema of the lower extremities.  Neurologic: Alert and oriented x 3.  EKG:  ASSESSMENT AND PLAN:

## 2011-06-05 NOTE — Assessment & Plan Note (Signed)
Perfectly stable.

## 2011-06-05 NOTE — Assessment & Plan Note (Signed)
Recheck CBC at the present time.

## 2011-06-05 NOTE — Assessment & Plan Note (Signed)
Stable at present.  However, on diuretics.  Will reassess BUN and Cr today. Discussed with patient.

## 2011-06-05 NOTE — Patient Instructions (Signed)
Your physician recommends that you schedule a follow-up appointment in: 2 months with Dr. Riley Kill

## 2011-06-30 ENCOUNTER — Encounter: Payer: Self-pay | Admitting: Gastroenterology

## 2011-06-30 ENCOUNTER — Ambulatory Visit (INDEPENDENT_AMBULATORY_CARE_PROVIDER_SITE_OTHER): Payer: Medicare Other | Admitting: Gastroenterology

## 2011-06-30 VITALS — BP 122/60 | HR 60 | Ht 70.0 in | Wt 173.0 lb

## 2011-06-30 DIAGNOSIS — F1010 Alcohol abuse, uncomplicated: Secondary | ICD-10-CM

## 2011-06-30 DIAGNOSIS — D649 Anemia, unspecified: Secondary | ICD-10-CM

## 2011-06-30 DIAGNOSIS — K7030 Alcoholic cirrhosis of liver without ascites: Secondary | ICD-10-CM

## 2011-06-30 DIAGNOSIS — K319 Disease of stomach and duodenum, unspecified: Secondary | ICD-10-CM

## 2011-06-30 MED ORDER — DEXLANSOPRAZOLE 60 MG PO CPDR: 60.0000 mg | DELAYED_RELEASE_CAPSULE | Freq: Every day | ORAL | Status: DC

## 2011-06-30 NOTE — Progress Notes (Signed)
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 This care of been complicated by multiple cardiovascular issues, and he is followed closely by cardiology and is on several diuretics. I saw him one month ago with an episode of melena which spontaneously resolved with initiation of PPI therapy and by mouth Carafate. He currently is having normal color stools and denies GI symptomatology. His appetite is good his weight is stable. He does have a family history of colon cancer in his father, but the patient has refused colonoscopy exams. Last endoscopy was in 2005. He is on aspirin 325 mg a day but denies use of NSAIDs.  Cannot find a previous colonoscopy report of this patient. Denies lower abdominal pain or bright red blood per rectum. Continues to use alcohol daily, but says he attends regular AA meetings.CDT level was elevated consistent with heavy alcohol abuse. Other labs were reviewed and show no evidence of acute alcoholic hepatitis, or chronic malnutrition. He does have a mild chronic iron deficiency which is improving. The patient denies mental status deterioration, abdominal pain, abdominal swelling, or worsening peripheral edema. Labs showed essentially normal albumin and prothrombin time.  Current Medications, Allergies, Past Medical History, Past Surgical History, Family History and Social History were reviewed in [employer] record.  Pertinent Review of Systems Negative.. some shortness of breath with exertion, chronic peripheral edema, but he denies other cardiovascular or pulmonary complaints. He also denies severe depression and, anxiety, or loss of control. He specifically denies acid reflux, dysphagia, current melena or hematochezia. 10 point review of systems otherwise negative.   Physical Exam: Elderly appearing male in no acute distress without stigmata  of chronic liver disease. His chest is clear, and he appears to be in a regular rhythm without significant gallops but he does have a systolic ejection murmur. He has mild hepatosplenomegaly without nodularity, tenderness, or ascites. Bowel sounds are not obstructive. Peripheral extremities show chronic edema of without pitting, no evidence of phlebitis or swollen hot joints. His mental status is entirely normal and there is no asterixis. Rectal exam showed a small skin tag but no masses, tenderness, or prostatic enlargement. There was normal color stool was guaiac-negative.   Assessment and Plan: Child"s A alcoholic cirrhosis withlow grade portal hypertension, and probable recent GI bleeding from portal gastropathy with associated salicylate use. He appears stable at this time with only a mild iron deficiency no evidence of continued GI bleeding.. I have asked him to continue daily PPI therapy with when necessary Carafate use, avoid other NSAIDs, and moderate his alcohol as much is possible per his chronic liver disease and elevated CDT level. Recent ultrasound was performed which showed liver echotexture consistent with cirrhosis, splenomegaly, gallbladder sludge, but no ascites or varices. He is to see Dr. Bonnee Quin in cardiology followup. If his edema worsens, we can certainly increase his Aldactone as tolerated. He is on beta blocker therapy for essential hypertension which hopefully will also lower his portal hypertension. Again, the patient has refused endoscopy and/or colonoscopy, both of which need to be performed. I gave him copy of his labs and ultrasound report today for his records. I will see him every 3 months or when necessary as needed. His primary care physician is Dr. Illene Regulus cardiologist Dr. Shawnie Pons.

## 2011-06-30 NOTE — Patient Instructions (Addendum)
Your prescription(s) have been sent to you pharmacy.  Please make an office visit to come back and see Dr Jarold Motto in 3 months.

## 2011-07-01 ENCOUNTER — Other Ambulatory Visit: Payer: Self-pay | Admitting: *Deleted

## 2011-07-01 MED ORDER — SIMVASTATIN 20 MG PO TABS: 20.0000 mg | ORAL_TABLET | Freq: Every day | ORAL | Status: DC

## 2011-07-01 NOTE — Telephone Encounter (Signed)
rx sent in today. [name]  

## 2011-07-09 ENCOUNTER — Other Ambulatory Visit: Payer: Self-pay | Admitting: Gastroenterology

## 2011-07-22 ENCOUNTER — Other Ambulatory Visit: Payer: Self-pay | Admitting: *Deleted

## 2011-07-22 MED ORDER — ZOLPIDEM TARTRATE 10 MG PO TABS: 10.0000 mg | ORAL_TABLET | Freq: Every evening | ORAL | Status: DC | PRN

## 2011-07-22 MED ORDER — AMLODIPINE BESYLATE 10 MG PO TABS: 10.0000 mg | ORAL_TABLET | Freq: Every day | ORAL | Status: DC

## 2011-07-31 LAB — ETHANOL: Alcohol, Ethyl (B): 158 — ABNORMAL HIGH

## 2011-08-03 ENCOUNTER — Ambulatory Visit (INDEPENDENT_AMBULATORY_CARE_PROVIDER_SITE_OTHER): Payer: Medicare Other | Admitting: Cardiology

## 2011-08-03 ENCOUNTER — Encounter: Payer: Self-pay | Admitting: Cardiology

## 2011-08-03 ENCOUNTER — Other Ambulatory Visit: Payer: Medicare Other | Admitting: *Deleted

## 2011-08-03 DIAGNOSIS — I421 Obstructive hypertrophic cardiomyopathy: Secondary | ICD-10-CM

## 2011-08-03 DIAGNOSIS — D649 Anemia, unspecified: Secondary | ICD-10-CM

## 2011-08-03 DIAGNOSIS — R600 Localized edema: Secondary | ICD-10-CM

## 2011-08-03 DIAGNOSIS — R609 Edema, unspecified: Secondary | ICD-10-CM

## 2011-08-03 DIAGNOSIS — I422 Other hypertrophic cardiomyopathy: Secondary | ICD-10-CM

## 2011-08-03 LAB — CBC WITH DIFFERENTIAL/PLATELET
Basophils Absolute: 0 10*3/uL (ref 0.0–0.1)
Eosinophils Absolute: 0.3 10*3/uL (ref 0.0–0.7)
Lymphocytes Relative: 10.7 % — ABNORMAL LOW (ref 12.0–46.0)
MCHC: 32.9 g/dL (ref 30.0–36.0)
MCV: 87.4 fl (ref 78.0–100.0)
Monocytes Absolute: 1 10*3/uL (ref 0.1–1.0)
Neutrophils Relative %: 76.3 % (ref 43.0–77.0)
Platelets: 223 10*3/uL (ref 150.0–400.0)
RBC: 4.12 Mil/uL — ABNORMAL LOW (ref 4.22–5.81)
RDW: 14.7 % — ABNORMAL HIGH (ref 11.5–14.6)

## 2011-08-03 NOTE — Assessment & Plan Note (Signed)
Has LE edema, and some liver related changes.  Continues on diuretics.  Cr has been a bit higher, and I have spoken with him about this.  Will recheck BMET as he is on furosemide as well as aldactone.

## 2011-08-03 NOTE — Patient Instructions (Addendum)
Your physician recommends that you have lab work today: BMP and CBC  Your physician wants you to follow-up in: 6 MONTHS.  You will receive a reminder letter in the mail two months in advance. If you don't receive a letter, please call our office to schedule the follow-up appointment.  Your physician recommends that you continue on your current medications as directed. Please refer to the Current Medication list given to you today.

## 2011-08-03 NOTE — Assessment & Plan Note (Signed)
Murmur, but no change in pattern.

## 2011-08-03 NOTE — Progress Notes (Signed)
HPI:  He is in for follow up.  He does not have any noticeable blood in his stool.  He has modest dyspnea of effort.  No chest pain.  He still goes to AA, and continues to drink alcohol at the same rate previously documented in the chart.  He has been seen in GI, but has not returned for any follow up visit.  Overall, he is stable.    Current Outpatient Prescriptions  Medication Sig Dispense Refill  . amLODipine (NORVASC) 10 MG tablet Take 1 tablet (10 mg total) by mouth daily.  90 tablet  1  . aspirin 325 MG EC tablet Take 162.5 mg by mouth daily.        . furosemide (LASIX) 20 MG tablet Take 2 tablets (40 mg total) by mouth daily.  90 tablet  3  . metoprolol (TOPROL-XL) 100 MG 24 hr tablet 1 1/2 tab po qd  45 tablet  12  . simvastatin (ZOCOR) 20 MG tablet Take 1 tablet (20 mg total) by mouth at bedtime.  90 tablet  3  . spironolactone (ALDACTONE) 25 MG tablet Take 25 mg by mouth daily.        Marland Kitchen ULTRAM 50 MG tablet TAKE ONE TABLET AT BEDTIME AS NEEDED  35 tablet  1  . zolpidem (AMBIEN) 10 MG tablet Take 1 tablet (10 mg total) by mouth at bedtime as needed.  30 tablet  2    No Known Allergies  Past Medical History  Diagnosis Date  . Hypertrophic cardiomyopathy     a.  echo 8/10: EF 55-65%, basal septal hypertrophy and small LVOT but no sig. gradient and no SAM, mod LVH, mild AI, mild LAE (IVS ED 13.6 mm);    b.  echo 4/12: EF  55-65%, mild LVH, Gr 1 diast dysfxn, very mild AS, mod AI,   . HTN (hypertension)   . HLD (hyperlipidemia)   . Alcoholic cirrhosis   . GERD (gastroesophageal reflux disease)   . Renal insufficiency   . Alcoholism     Past Surgical History  Procedure Date  . Inguinal hernia repair   . Melanoma excision     Family History  Problem Relation Age of Onset  . Colon cancer Father   . Heart disease Mother     History   Social History  . Marital Status: Married    Spouse Name: N/A    Number of Children: 4  . Years of Education: N/A   Occupational History    . Retired    Social History Main Topics  . Smoking status: Never Smoker   . Smokeless tobacco: Never Used  . Alcohol Use: No     member of AA  . Drug Use: No  . Sexually Active: Not on file   Other Topics Concern  . Not on file   Social History Narrative  . No narrative on file    ROS: Please see the HPI.  All other systems reviewed and negative.  PHYSICAL EXAM:  BP 124/60  Pulse 76  Resp 18  Ht 5\' 10"  (1.778 m)  Wt 168 lb (76.204 kg)  BMI 24.11 kg/m2  General: Well developed, well nourished, in no acute distress. Head:  Normocephalic and atraumatic. Neck: no JVD Lungs: Clear to auscultation and percussion. Heart: Normal S1 and S2.  SEM 2/6, no DM.  Abdomen:  Normal bowel sounds; soft; non tender; no organomegaly Pulses: Pulses normal in all 4 extremities. Extremities: No clubbing or cyanosis.  Has aboutr  1-2 plus edema, but not much changed from baseline. Neurologic: Alert and oriented x 3.  EKG:  NSR.  RBBB.  Nonspecific T abnormality.   ASSESSMENT AND PLAN:

## 2011-08-03 NOTE — Assessment & Plan Note (Signed)
Will recheck hemoglobin.  He likely will continue to have the potential to bleed.  Will recheck and monitor over time.

## 2011-08-04 ENCOUNTER — Other Ambulatory Visit: Payer: Self-pay | Admitting: *Deleted

## 2011-08-04 LAB — BASIC METABOLIC PANEL
BUN: 28 mg/dL — ABNORMAL HIGH (ref 6–23)
CO2: 19 mEq/L (ref 19–32)
Calcium: 8.3 mg/dL — ABNORMAL LOW (ref 8.4–10.5)
Chloride: 106 mEq/L (ref 96–112)
Creatinine, Ser: 1.5 mg/dL (ref 0.4–1.5)
Glucose, Bld: 100 mg/dL — ABNORMAL HIGH (ref 70–99)

## 2011-08-04 MED ORDER — SPIRONOLACTONE 25 MG PO TABS: 25.0000 mg | ORAL_TABLET | Freq: Every day | ORAL | Status: DC

## 2011-08-05 ENCOUNTER — Telehealth: Payer: Self-pay | Admitting: Cardiology

## 2011-08-05 DIAGNOSIS — Z23 Encounter for immunization: Secondary | ICD-10-CM

## 2011-08-05 NOTE — Telephone Encounter (Signed)
Pt needs note to get shingles shot

## 2011-08-05 NOTE — Telephone Encounter (Signed)
Dr Riley Kill aware and said that he would have to review information about shingles vaccine.  The pt is aware.

## 2011-08-07 NOTE — Telephone Encounter (Signed)
Patient need a rx written for shingle shot.

## 2011-08-07 NOTE — Telephone Encounter (Signed)
I talked with pt. Pt is aware Dr Riley Kill is reviewing information. Pt is aware we will call him when Dr Riley Kill has finished reviewing information and made a decision about the shingles vaccine.

## 2011-08-07 NOTE — Telephone Encounter (Signed)
Patient returning nurse call

## 2011-08-07 NOTE — Telephone Encounter (Signed)
LMTC

## 2011-08-10 NOTE — Telephone Encounter (Signed)
Reviewed with Dr Riley Kill who gives an order for pt to receive the Shingles vaccine. Pt's line is busy.  Will continue to attempt to reach him.

## 2011-08-10 NOTE — Telephone Encounter (Signed)
Rx called in to pharmacy. 

## 2011-08-18 ENCOUNTER — Other Ambulatory Visit: Payer: Self-pay | Admitting: Physician Assistant

## 2011-08-18 ENCOUNTER — Other Ambulatory Visit: Payer: Self-pay | Admitting: Internal Medicine

## 2011-10-05 ENCOUNTER — Other Ambulatory Visit: Payer: Self-pay | Admitting: Internal Medicine

## 2011-10-06 NOTE — Telephone Encounter (Signed)
OK for refill x 5. 

## 2012-01-04 ENCOUNTER — Encounter (HOSPITAL_COMMUNITY): Payer: Self-pay | Admitting: *Deleted

## 2012-01-04 ENCOUNTER — Emergency Department (HOSPITAL_COMMUNITY)
Admission: EM | Admit: 2012-01-04 | Discharge: 2012-01-05 | Disposition: A | Discharge status: Home / Self Care | Payer: Medicare Other | Attending: Emergency Medicine | Admitting: Emergency Medicine

## 2012-01-04 ENCOUNTER — Other Ambulatory Visit: Payer: Self-pay

## 2012-01-04 ENCOUNTER — Emergency Department (HOSPITAL_COMMUNITY): Payer: Medicare Other

## 2012-01-04 DIAGNOSIS — K7030 Alcoholic cirrhosis of liver without ascites: Secondary | ICD-10-CM | POA: Insufficient documentation

## 2012-01-04 DIAGNOSIS — Z79899 Other long term (current) drug therapy: Secondary | ICD-10-CM | POA: Insufficient documentation

## 2012-01-04 DIAGNOSIS — I10 Essential (primary) hypertension: Secondary | ICD-10-CM | POA: Insufficient documentation

## 2012-01-04 DIAGNOSIS — F1020 Alcohol dependence, uncomplicated: Secondary | ICD-10-CM | POA: Insufficient documentation

## 2012-01-04 DIAGNOSIS — R55 Syncope and collapse: Secondary | ICD-10-CM | POA: Insufficient documentation

## 2012-01-04 DIAGNOSIS — G319 Degenerative disease of nervous system, unspecified: Secondary | ICD-10-CM | POA: Insufficient documentation

## 2012-01-04 DIAGNOSIS — F17200 Nicotine dependence, unspecified, uncomplicated: Secondary | ICD-10-CM | POA: Insufficient documentation

## 2012-01-04 DIAGNOSIS — F10929 Alcohol use, unspecified with intoxication, unspecified: Secondary | ICD-10-CM

## 2012-01-04 DIAGNOSIS — Y9210 Unspecified residential institution as the place of occurrence of the external cause: Secondary | ICD-10-CM | POA: Insufficient documentation

## 2012-01-04 DIAGNOSIS — S0003XA Contusion of scalp, initial encounter: Secondary | ICD-10-CM | POA: Insufficient documentation

## 2012-01-04 DIAGNOSIS — E785 Hyperlipidemia, unspecified: Secondary | ICD-10-CM | POA: Insufficient documentation

## 2012-01-04 DIAGNOSIS — K219 Gastro-esophageal reflux disease without esophagitis: Secondary | ICD-10-CM | POA: Insufficient documentation

## 2012-01-04 DIAGNOSIS — W19XXXA Unspecified fall, initial encounter: Secondary | ICD-10-CM | POA: Insufficient documentation

## 2012-01-04 DIAGNOSIS — F1010 Alcohol abuse, uncomplicated: Secondary | ICD-10-CM | POA: Insufficient documentation

## 2012-01-04 DIAGNOSIS — S0990XA Unspecified injury of head, initial encounter: Secondary | ICD-10-CM | POA: Insufficient documentation

## 2012-01-04 NOTE — ED Provider Notes (Signed)
History    78 year old male presenting after fall. Patient is apparently fall multiple times today. Patient denied alcohol use and from his family. He does have alcohol on his breath though and apparently admitted to nursing away from his family that he had been drinking vodka today. Patient denies pain anywhere. No reported loss of consciousness. Patient takes 325 mg of aspirin daily, otherwise no blood thinning medication. Patient denies any acute visual changes. No numbness, tingling or loss of strength. No nausea or vomiting.  CSN: [id number]  Arrival date & time 01/04/12  2121   First MD Initiated Contact with Patient 01/04/12 2206      Chief Complaint  Patient presents with  . Loss of Consciousness    (Consider location/radiation/quality/duration/timing/severity/associated sxs/prior treatment) HPI  Past Medical History  Diagnosis Date  . Hypertrophic cardiomyopathy     a.  echo 8/10: EF 55-65%, basal septal hypertrophy and small LVOT but no sig. gradient and no SAM, mod LVH, mild AI, mild LAE (IVS ED 13.6 mm);    b.  echo 4/12: EF  55-65%, mild LVH, Gr 1 diast dysfxn, very mild AS, mod AI,   . HTN (hypertension)   . HLD (hyperlipidemia)   . Alcoholic cirrhosis   . GERD (gastroesophageal reflux disease)   . Renal insufficiency   . Alcoholism     Past Surgical History  Procedure Date  . Inguinal hernia repair   . Melanoma excision     Family History  Problem Relation Age of Onset  . Colon cancer Father   . Heart disease Mother     History  Substance Use Topics  . Smoking status: Never Smoker   . Smokeless tobacco: Never Used  . Alcohol Use: No     member of AA      Review of Systems   Review of symptoms negative unless otherwise noted in HPI.   Allergies  Review of patient's allergies indicates no known allergies.  Home Medications   Current Outpatient Rx  Name Route Sig Dispense Refill  . AMLODIPINE BESYLATE 10 MG PO TABS Oral Take 10 mg by mouth  daily.    . ASPIRIN 325 MG PO TBEC Oral Take 162.5 mg by mouth daily.      . FUROSEMIDE 20 MG PO TABS Oral Take 40 mg by mouth daily.    Marland Kitchen METOPROLOL SUCCINATE ER 100 MG PO TB24 Oral Take 150 mg by mouth daily. Take with or immediately following a meal.    . SIMVASTATIN 20 MG PO TABS Oral Take 20 mg by mouth at bedtime.    . SPIRONOLACTONE 25 MG PO TABS Oral Take 25 mg by mouth daily.    . TRAMADOL HCL 50 MG PO TABS Oral Take 50 mg by mouth at bedtime as needed. For pain.    Marland Kitchen ZOLPIDEM TARTRATE 10 MG PO TABS Oral Take 10 mg by mouth at bedtime as needed. For sleep.      BP 107/51  Pulse 59  Temp(Src) 97.8 F (36.6 C) (Oral)  Resp 15  SpO2 97%  Physical Exam  Nursing note and vitals reviewed. Constitutional: He appears well-developed and well-nourished. No distress.       Laying in bed. No acute distress. Alcohol on breath  HENT:  Head: Normocephalic.       Occipital hematoma. There is a small superficial abrasion overlying it. There is no active bleeding.  Eyes: Conjunctivae are normal. Pupils are equal, round, and reactive to light. Right eye exhibits no discharge.  Left eye exhibits no discharge.  Neck: Neck supple.  Cardiovascular: Normal rate, regular rhythm and normal heart sounds.  Exam reveals no gallop and no friction rub.   No murmur heard. Pulmonary/Chest: Effort normal and breath sounds normal. No respiratory distress.  Abdominal: Soft. He exhibits no distension. There is no tenderness.  Musculoskeletal: Normal range of motion. He exhibits no edema and no tenderness.       No midline spinal tenderness. No bony tenderness extremities. No pain to range of motion of the shoulders, elbows, wrists hips, knees or ankles.  Neurological: He is alert.  Skin: Skin is warm and dry. He is not diaphoretic.  Psychiatric: His behavior is normal.    ED Course  Procedures (including critical care time)  Labs Reviewed - No data to display No results found.  Ct Head Wo  Contrast  01/04/2012  *RADIOLOGY REPORT*  Clinical Data:  Status post syncope and fall.  Does not remember falling.  Abrasion to the posterior scalp; only partially oriented to date.  Concern for cervical spine injury.  CT HEAD WITHOUT CONTRAST AND CT CERVICAL SPINE WITHOUT CONTRAST  Technique:  Multidetector CT imaging of the head and cervical spine was performed following the standard protocol without intravenous contrast.  Multiplanar CT image reconstructions of the cervical spine were also generated.  Comparison: CT of the head and cervical spine performed 01/03/2008  CT HEAD  Findings: There is no evidence of acute infarction, mass lesion, or intra- or extra-axial hemorrhage on CT.  Prominence of the ventricles and sulci reflects mild to moderate cortical volume loss.  Cerebellar atrophy is noted.  Mild periventricular and subcortical white matter change likely reflects small vessel ischemic microangiopathy.  The brainstem and fourth ventricle are within normal limits.  The basal ganglia are unremarkable in appearance.  The cerebral hemispheres demonstrate grossly normal gray-white differentiation. No mass effect or midline shift is seen.  There is no evidence of fracture; visualized osseous structures are unremarkable in appearance.  The orbits are within normal limits. Mild mucosal thickening is noted within the right maxillary sinus; the remaining paranasal sinuses and mastoid air cells are well- aerated.  Minimal soft tissue swelling is noted posteriorly near the vertex.  IMPRESSION:  1.  No evidence of traumatic intracranial injury or fracture. 2.  Minimal soft tissue swelling posteriorly near the vertex. 3.  Mild to moderate cortical volume loss and mild small vessel ischemic microangiopathy. 4.  Mild mucosal thickening within the right maxillary sinus.  CT CERVICAL SPINE  Findings: There is no evidence of fracture or subluxation. Vertebral bodies demonstrate normal height and alignment.  There is  narrowing along the intervertebral disc spaces at the lower cervical spine, with associated anterior and posterior disc complexes.  Mild facet disease is noted along the cervical spine. Degenerative change is noted about the dens.  Prevertebral soft tissues are within normal limits.  The visualized portions of the thyroid gland are unremarkable in appearance.  The visualized lung apices are clear.  No significant soft tissue abnormalities are seen.  Scattered calcification is noted along the carotid bifurcations bilaterally.  IMPRESSION:  1.  No evidence of fracture or subluxation along the cervical spine. 2.  Mild degenerative change at the lower cervical spine. 3.  Scattered calcification along the carotid bifurcations bilaterally.  Original Report Authenticated By: Tonia Ghent, M.D.   Ct Cervical Spine Wo Contrast  01/04/2012  *RADIOLOGY REPORT*  Clinical Data:  Status post syncope and fall.  Does not remember falling.  Abrasion to the posterior scalp; only partially oriented to date.  Concern for cervical spine injury.  CT HEAD WITHOUT CONTRAST AND CT CERVICAL SPINE WITHOUT CONTRAST  Technique:  Multidetector CT imaging of the head and cervical spine was performed following the standard protocol without intravenous contrast.  Multiplanar CT image reconstructions of the cervical spine were also generated.  Comparison: CT of the head and cervical spine performed 01/03/2008  CT HEAD  Findings: There is no evidence of acute infarction, mass lesion, or intra- or extra-axial hemorrhage on CT.  Prominence of the ventricles and sulci reflects mild to moderate cortical volume loss.  Cerebellar atrophy is noted.  Mild periventricular and subcortical white matter change likely reflects small vessel ischemic microangiopathy.  The brainstem and fourth ventricle are within normal limits.  The basal ganglia are unremarkable in appearance.  The cerebral hemispheres demonstrate grossly normal gray-white differentiation. No  mass effect or midline shift is seen.  There is no evidence of fracture; visualized osseous structures are unremarkable in appearance.  The orbits are within normal limits. Mild mucosal thickening is noted within the right maxillary sinus; the remaining paranasal sinuses and mastoid air cells are well- aerated.  Minimal soft tissue swelling is noted posteriorly near the vertex.  IMPRESSION:  1.  No evidence of traumatic intracranial injury or fracture. 2.  Minimal soft tissue swelling posteriorly near the vertex. 3.  Mild to moderate cortical volume loss and mild small vessel ischemic microangiopathy. 4.  Mild mucosal thickening within the right maxillary sinus.  CT CERVICAL SPINE  Findings: There is no evidence of fracture or subluxation. Vertebral bodies demonstrate normal height and alignment.  There is narrowing along the intervertebral disc spaces at the lower cervical spine, with associated anterior and posterior disc complexes.  Mild facet disease is noted along the cervical spine. Degenerative change is noted about the dens.  Prevertebral soft tissues are within normal limits.  The visualized portions of the thyroid gland are unremarkable in appearance.  The visualized lung apices are clear.  No significant soft tissue abnormalities are seen.  Scattered calcification is noted along the carotid bifurcations bilaterally.  IMPRESSION:  1.  No evidence of fracture or subluxation along the cervical spine. 2.  Mild degenerative change at the lower cervical spine. 3.  Scattered calcification along the carotid bifurcations bilaterally.  Original Report Authenticated By: Tonia Ghent, M.D.    1. Closed head injury   2. Alcohol intoxication   3. Scalp hematoma       MDM  78 year old male with head injury after fall. Suspect that's fall secondary to alcohol intoxication. CTs of the head and C-spine are negative for serious acute traumatic injury. Patient clinically is intoxicated is oriented x3. Has a wife  at bedside taht the patient is being discharged with. Is willing to assume care for him. Head injury instructions and return precautions were discussed. Outpatient followup as needed.        Raeford Razor, MD 01/07/12 [phone]

## 2012-01-04 NOTE — ED Notes (Signed)
Pt brought in via EMS s/p fall/syncopal episode - pt does not remember events or fall - states he feels as though he may have tripped walking into his house. Pt w/ hx of alcoholism and smells of ETOH. Pt w/ abrasion to posterior of scalp. Pt unable to verbalize todays date however was able to state the year after multiple attempts. No other obvious injuries noted and pt has no complaints of pain at present.

## 2012-01-04 NOTE — Discharge Instructions (Signed)
Head Injury, Adult A head injury happens when the head is hit really hard. A head injury may cause sleepiness, headache, throwing up (vomiting), and problems seeing. If the head injury is really bad, you may need to stay in the hospital. HOME CARE  Have someone with you for the first 24 hours. This person should wake you up every 1 hour to check on your condition.   Only drink water or clear fluid for the rest of the day. Then, go back to your regular diet.   Only take medicines as told by your doctor. Do not take aspirin.   Do not drink alcohol for 2 days.   Do not take medicines that help your relax (sedatives) for 2 days.  Side effects may happen for up to 7 to 10 days. Watch for new problems. GET HELP RIGHT AWAY IF:   You are confused or sleepy.   You cannot be woken up.   You feel sick to your stomach (nauseous) or keep throwing up.   Your dizziness or unsteadiness is get worse, or your cannot walk.   You start to shake (convulse) or pass out (faint).   You have very bad, lasting headaches that are not helped by medicine.   You cannot use your arms or legs like normal.   You have clear or bloody fluid coming from your nose or ears.  MAKE SURE YOU:   Understand these instructions.   Will watch your condition.   Will get help right away if you are not doing well or get worse.  Document Released: 10/08/2008 Document Revised: 07/08/2011 Document Reviewed: 09/11/2009 [facility] Patient Information 2012 [place], [place].Facial and Scalp Contusions You have a contusion (bruise) on your face or scalp. Injuries around the face and head generally cause a lot of swelling, especially around the eyes. This is because the blood supply to this area is good and tissues are loose. Swelling from a contusion is usually better in 2-3 days. It may take a week or longer for a "black eye" to clear up completely. HOME CARE INSTRUCTIONS   Apply ice packs to the injured area for about 15 to 20  minutes, 3 to 4 times a day, for the first couple days. This helps keep swelling down.   Use mild pain medicine as needed or instructed by your caregiver.   You may have a mild headache, slight dizziness, nausea, and weakness for a few days. This usually clears up with bed rest and mild pain medications.   Contact your caregiver if you are concerned about facial defects or have any difficulty with your bite or develop pain with chewing.  SEEK IMMEDIATE MEDICAL CARE IF:  You develop severe pain or a headache, unrelieved by medication.   You develop unusual sleepiness, confusion, personality changes, or vomiting.   You have a persistent nosebleed, double or blurred vision, or drainage from the nose or ear.   You have difficulty walking or using your arms or legs.  MAKE SURE YOU:   Understand these instructions.   Will watch your condition.   Will get help right away if you are not doing well or get worse.  Document Released: 12/03/2004 Document Revised: 07/08/2011 Document Reviewed: 10/26/2005 [facility] Patient Information 2012 [place], [place].Scalp Hematoma A bruise of the scalp causes swelling from an accumulation of blood in the area of the injury. This is a common problem. This problem is also called a scalp hematoma. This normally is not serious. Usually a scalp hematoma causes  only mild local pain or headache. It usually disappears after 2 to 3 days with proper treatment.  You should apply ice packs to the swollen area for 20 to 30 minutes every 2 to 3 hours until the swelling improves. Only take over-the-counter or prescription medicines for pain and discomfort as directed by your caregiver. It is best to avoid aspirin because this may increase bleeding. You may have a mild headache, slight dizziness, nausea, or weakness for the next few days. This usually clears up with rest.  SEEK IMMEDIATE MEDICAL CARE IF:  You develop severe pain or headache, unrelieved by medicine.   You  develop unusual sleepiness, confusion, personality changes, or difficulty with coordination or walking.   You develop a persistent nose bleed, double or blurred vision, or unusual drainage from the nose or ear.   You develop numbness or weakness in the limbs.  Document Released: 12/03/2004 Document Revised: 07/08/2011 Document Reviewed: 09/10/2009 [facility] Patient Information 2012 [place], [place]. RESOURCE GUIDE  Dental Problems  Patients with Medicaid: [facility] [phone] [address]                                           [phone] [address] Phone:  [phone]                                                  Phone:  [phone]  If unable to pay or uninsured, contact:  [facility] or [facility]. to become qualified for the adult dental clinic.  Chronic Pain Problems Contact [facility]  [phone] Patients need to be referred by their primary care doctor.  Insufficient Money for Medicine Contact [employer]:  call "211" or [facility] 317-808-4248.  No Primary Care Doctor Call [facility]  727-701-6375 Other agencies that provide inexpensive medical care    [facility]  [phone]    [facility] Internal Medicine  [phone]    [facility]  [phone]    [facility]  [phone]    [facility]  6151549574    [facility] Child Clinic  [phone]  Psychological Services [facility] Behavioral Health  [phone] [facility]  [phone] [facility] Mental Health   [phone] (emergency services [phone])  Substance Abuse Resources [facility]  684-243-6344 [facility] [phone] [facility] [place] [phone] Floydene Flock [phone] Residential & Outpatient Substance Abuse Program  [phone]  Abuse/Neglect [facility] Child Abuse Hotline 220-494-3117 [facility] Child Abuse Hotline 339-500-7902 (After  Hours)  Emergency Shelter [facility] 706-204-6216  Maternity Homes [facility] [place] [facility] [place] [phone] [facility] [phone]  MRSA Hotline #:   (980) 539-0132    [facility] Resources  [facility] [place]     [employer]                          [facility] Dept. [address] [place] Phone:  [phone]                                   Phone:  [phone]                 Phone:  [place] Phone:  [place] [phone] (After Hours)

## 2012-01-04 NOTE — ED Notes (Signed)
D/c instructions reviewed w/ pt and family - pt and family deny any further questions or concerns at present.\ 

## 2012-01-04 NOTE — ED Notes (Signed)
Per EMS- from home lives with wife. fallen X3 today; disoriented, hematoma & lac, to posterior occipital; pt does not recall falling; pt oriented to self and location, not to time, or situation; stroke scale neg; CBG 134; vss 126/74, HR 68, 2L [place] 100%; pt with hx of ETOH, denied drinking while in front of family, when separated from family, pt admitted that he drank vodka- one drink; per EMS, ankles are always swollen; 20g LAC;

## 2012-01-18 ENCOUNTER — Other Ambulatory Visit: Payer: Self-pay | Admitting: Internal Medicine

## 2012-01-19 ENCOUNTER — Other Ambulatory Visit: Payer: Self-pay | Admitting: Internal Medicine

## 2012-01-21 ENCOUNTER — Other Ambulatory Visit: Payer: Self-pay | Admitting: *Deleted

## 2012-01-21 NOTE — Telephone Encounter (Signed)
Request for Zolpidem [last refill 03.15.12 #30x2]

## 2012-01-21 NOTE — Telephone Encounter (Signed)
I cannot find an ov in the last year.  Ok for 1 month refill - will need medicare wellness exam.

## 2012-01-22 MED ORDER — ZOLPIDEM TARTRATE 10 MG PO TABS: 10.0000 mg | ORAL_TABLET | Freq: Every evening | ORAL | Status: DC | PRN

## 2012-01-22 NOTE — Telephone Encounter (Signed)
Pt advised and transferred to schedule appt. Rx refilled for 1 mth per MD

## 2012-02-02 ENCOUNTER — Ambulatory Visit (INDEPENDENT_AMBULATORY_CARE_PROVIDER_SITE_OTHER): Payer: Medicare Other | Admitting: Cardiology

## 2012-02-02 ENCOUNTER — Encounter: Payer: Self-pay | Admitting: Cardiology

## 2012-02-02 VITALS — BP 118/68 | HR 76 | Ht 68.0 in | Wt 161.4 lb

## 2012-02-02 DIAGNOSIS — E7800 Pure hypercholesterolemia, unspecified: Secondary | ICD-10-CM

## 2012-02-02 DIAGNOSIS — I10 Essential (primary) hypertension: Secondary | ICD-10-CM

## 2012-02-02 DIAGNOSIS — I359 Nonrheumatic aortic valve disorder, unspecified: Secondary | ICD-10-CM

## 2012-02-02 DIAGNOSIS — R609 Edema, unspecified: Secondary | ICD-10-CM

## 2012-02-02 DIAGNOSIS — I421 Obstructive hypertrophic cardiomyopathy: Secondary | ICD-10-CM

## 2012-02-02 DIAGNOSIS — I351 Nonrheumatic aortic (valve) insufficiency: Secondary | ICD-10-CM

## 2012-02-02 DIAGNOSIS — D649 Anemia, unspecified: Secondary | ICD-10-CM

## 2012-02-02 DIAGNOSIS — R600 Localized edema: Secondary | ICD-10-CM

## 2012-02-02 LAB — CBC WITH DIFFERENTIAL/PLATELET
Basophils Absolute: 0.1 10*3/uL (ref 0.0–0.1)
Basophils Relative: 0.6 % (ref 0.0–3.0)
Eosinophils Absolute: 0.4 10*3/uL (ref 0.0–0.7)
Lymphocytes Relative: 10.3 % — ABNORMAL LOW (ref 12.0–46.0)
MCHC: 33.4 g/dL (ref 30.0–36.0)
MCV: 95.5 fl (ref 78.0–100.0)
Monocytes Absolute: 1.1 10*3/uL — ABNORMAL HIGH (ref 0.1–1.0)
Neutro Abs: 7.4 10*3/uL (ref 1.4–7.7)
Neutrophils Relative %: 74.1 % (ref 43.0–77.0)
RBC: 4.21 Mil/uL — ABNORMAL LOW (ref 4.22–5.81)
RDW: 14.7 % — ABNORMAL HIGH (ref 11.5–14.6)

## 2012-02-02 LAB — HEPATIC FUNCTION PANEL
Albumin: 4 g/dL (ref 3.5–5.2)
Total Protein: 7.4 g/dL (ref 6.0–8.3)

## 2012-02-02 LAB — BASIC METABOLIC PANEL
CO2: 27 mEq/L (ref 19–32)
Calcium: 9 mg/dL (ref 8.4–10.5)
Chloride: 102 mEq/L (ref 96–112)
Creatinine, Ser: 1.2 mg/dL (ref 0.4–1.5)
Glucose, Bld: 115 mg/dL — ABNORMAL HIGH (ref 70–99)

## 2012-02-02 LAB — LIPID PANEL
Cholesterol: 112 mg/dL (ref 0–200)
HDL: 54.4 mg/dL (ref >39.00)
Total CHOL/HDL Ratio: 2
Triglycerides: 41 mg/dL (ref 0.0–149.0)

## 2012-02-02 NOTE — Assessment & Plan Note (Signed)
See echo report.  Not clear HOCM.

## 2012-02-02 NOTE — Assessment & Plan Note (Signed)
No pitting at present.

## 2012-02-02 NOTE — Assessment & Plan Note (Signed)
See ER visit and HPI.

## 2012-02-02 NOTE — Patient Instructions (Signed)
Your physician wants you to follow-up in: 6 MONTHS with Dr Riley Kill. You will receive a reminder letter in the mail two months in advance. If you don't receive a letter, please call our office to schedule the follow-up appointment.  Your physician recommends that you continue on your current medications as directed. Please refer to the Current Medication list given to you today.  Your physician recommends that you have lab work today: LIPID, LIVER, BMP and CBC

## 2012-02-02 NOTE — Progress Notes (Signed)
HPI:  [name] is doing pretty well.  Still goes to AA about four times per week.  Recently in the ER for some head trauma, and notes were reviewed in detail.  He still drinks a few drinks, although he says this is less.  See ER notes.  Denies any chest pain.  Very mild shortness of breath.  Uses a cain for stabilization. He does not remember being in the ER.    Current Outpatient Prescriptions  Medication Sig Dispense Refill  . amLODipine (NORVASC) 10 MG tablet TAKE ONE TABLET EVERY DAY  90 tablet  1  . aspirin 325 MG EC tablet Take 162.5 mg by mouth daily.        . furosemide (LASIX) 20 MG tablet Take 40 mg by mouth daily.      Marland Kitchen HYDROcodone-acetaminophen (VICODIN) 2.5-500 MG per tablet Take 1 tablet by mouth 2 (two) times daily.      . metoprolol succinate (TOPROL-XL) 100 MG 24 hr tablet Take 150 mg by mouth daily. Take with or immediately following a meal.      . simvastatin (ZOCOR) 20 MG tablet Take 20 mg by mouth at bedtime.      Marland Kitchen spironolactone (ALDACTONE) 25 MG tablet Take 25 mg by mouth daily.      Marland Kitchen zolpidem (AMBIEN) 10 MG tablet Take 1 tablet (10 mg total) by mouth at bedtime as needed. For sleep.  30 tablet  0    No Known Allergies  Past Medical History  Diagnosis Date  . Hypertrophic cardiomyopathy     a.  echo 8/10: EF 55-65%, basal septal hypertrophy and small LVOT but no sig. gradient and no SAM, mod LVH, mild AI, mild LAE (IVS ED 13.6 mm);    b.  echo 4/12: EF  55-65%, mild LVH, Gr 1 diast dysfxn, very mild AS, mod AI,   . HTN (hypertension)   . HLD (hyperlipidemia)   . Alcoholic cirrhosis   . GERD (gastroesophageal reflux disease)   . Renal insufficiency   . Alcoholism     Past Surgical History  Procedure Date  . Inguinal hernia repair   . Melanoma excision     Family History  Problem Relation Age of Onset  . Colon cancer Father   . Heart disease Mother     History   Social History  . Marital Status: Married    Spouse Name: N/A    Number of Children:  4  . Years of Education: N/A   Occupational History  . Retired    Social History Main Topics  . Smoking status: Never Smoker   . Smokeless tobacco: Never Used  . Alcohol Use: No     member of AA  . Drug Use: No  . Sexually Active: Not on file   Other Topics Concern  . Not on file   Social History Narrative  . No narrative on file    ROS: Please see the HPI.  All other systems reviewed and negative.  PHYSICAL EXAM:  BP 118/68  Pulse 76  Ht 5\' 8"  (1.727 m)  Wt 161 lb 6.4 oz (73.211 kg)  BMI 24.54 kg/m2  General: Older gentleman in no distress.   Head:  Normocephalic and atraumatic.  No current evidence of trauma. Neck: no JVD Lungs: Clear to auscultation and percussion. Heart: Normal S1 and S2.  2/6 SEM.  Early DM of AI, 1-2/6.   Abdomen:  Normal bowel sounds; soft; non tender; no organomegaly Pulses: Pulses normal in  all 4 extremities. Extremities: No clubbing or cyanosis. Legs enlarged but no pitting edema.   Neurologic: Alert and oriented x 3.  EKG:  Not done today.   ASSESSMENT AND PLAN:

## 2012-02-02 NOTE — Assessment & Plan Note (Signed)
Has been mild.  Will recheck.

## 2012-02-02 NOTE — Assessment & Plan Note (Signed)
Well controlled 

## 2012-02-08 ENCOUNTER — Other Ambulatory Visit: Payer: Self-pay | Admitting: Internal Medicine

## 2012-02-08 NOTE — Telephone Encounter (Signed)
Zolpidem request [last refill 03.15.13 #30x0] DENIED.

## 2012-02-09 ENCOUNTER — Telehealth: Payer: Self-pay | Admitting: Pharmacist

## 2012-02-09 NOTE — Telephone Encounter (Signed)
Pt called to ask for a refill for his Ambien from Dr. Riley Kill.  Reviewed pt's chart.  This was last refilled by Dr. Debby Bud on 3/15.  Discussed with Dr. Riley Kill.  He does not treat pt's primary care issues and will not refill his ambien.  Relayed information to patient.  He states he is almost out of the prescription because he has been taking 1 tablet every night and if he wakes up during the night he will take an extra 1/2 tablet.  Suggested pt call his primary care MD for refills and to discuss his sleep habits, since [place] may not be working well.  He would like a call back from Dr. Riley Kill to discuss further.

## 2012-02-10 ENCOUNTER — Telehealth: Payer: Self-pay | Admitting: *Deleted

## 2012-02-10 MED ORDER — ZOLPIDEM TARTRATE 10 MG PO TABS: 10.0000 mg | ORAL_TABLET | Freq: Every evening | ORAL | Status: DC | PRN

## 2012-02-10 NOTE — Telephone Encounter (Signed)
Dr Debby Bud' office has spoken to patient today Before he made this second call to your office on 04.03.13/03:04pm; we are aware of his situation and Dr Debby Bud is being informed via phone note. Thanks so much/SLS

## 2012-02-10 NOTE — Telephone Encounter (Signed)
1. Recommended dose for patients over 65 is 5 mg ambien per night. 2. Will refill #30 ambien 10 mg but he should not be taking additional doses 3. If he is having this much trouble with sleep can be worked in sooner than his may wellness exam to discuss sleep issues and appropriate medications

## 2012-02-10 NOTE — Telephone Encounter (Signed)
The pt called the office and asked why I had not refilled his Ambien.  The pt said that he had spoken with me this morning and I made him aware that I had not spoken with him today.  The pt did speak with Weston Brass Pharm D yesterday.  I made the pt aware the Dr Riley Kill does not refill Ambien and this medication would have to be filled by PCP.  The pt said he has not seen Dr Debby Bud in some time and does not understand why Dr Riley Kill will not refill this medication.  I asked the pt who gave him the Rx for Ambien and he said Dr Debby Bud. I once again made the pt aware that Dr Riley Kill does not fill this type of drug. I also reminded the pt that during his office visit 02/02/12 Dr Riley Kill told him to contact PCP for further refills. The pt asked me to take care of getting his PCP to refill medication.  I made the pt aware that he would need to contact Dr Norin's office and then I transferred his call to [phone].

## 2012-02-10 NOTE — Telephone Encounter (Signed)
Patient informed Rx phoned in to pharmacy/[facility]

## 2012-02-10 NOTE — Telephone Encounter (Signed)
Patient called upset about phone call made yesterday [see 04.02.13 & 04.03.13 phone note to Cardiology/coumadin] about his Zolpidem; explained that we were not aware or informed of this conversation until now when being told, as this was through another [place] office; patient understood. Patient reports that he is taking more than prescribed [see phone note on refill approval 03.14.13], he is currently taking 1.5 tablets daily and is requesting another refill before his scheduled 05.07.13 OV that he instructed to make for Wellness Exam; which he states that he is not coming into office to see you for this.? Please advise.

## 2012-02-15 ENCOUNTER — Other Ambulatory Visit: Payer: Self-pay | Admitting: Cardiology

## 2012-03-04 ENCOUNTER — Other Ambulatory Visit: Payer: Self-pay | Admitting: *Deleted

## 2012-03-04 NOTE — Telephone Encounter (Signed)
Patient has upcoming appt. 03/15/2012 but will be out of medication 4 days prior to appt.  Please advise. sue

## 2012-03-04 NOTE — Telephone Encounter (Signed)
Per Cardiology, pt called requesting refill of Ambien form Dr Riley Kill. Pt was advised that this medication is managed by [facility]EN. Pt asked Cards to inform PCP that he is requesting refill. I advised pt that medication refill request had been received and is currently pending to MEN. Pt was very dissatisfied with my response stating that he takes 1.5 tablets PRN and that MEN should "take his word for it and refill medication" . When I advised pt of MD's recommendation per note 02/09/2012 he requested I tell MEN that he was very dissatisfied that he (MEN) did not want him to sleep. I tried to speak with pt about getting a sooner appt but he declined and ended the call.

## 2012-03-04 NOTE — Telephone Encounter (Signed)
Rx refill request denied, pt informed.

## 2012-03-11 ENCOUNTER — Other Ambulatory Visit: Payer: Self-pay | Admitting: Cardiology

## 2012-03-15 ENCOUNTER — Ambulatory Visit (INDEPENDENT_AMBULATORY_CARE_PROVIDER_SITE_OTHER): Payer: Medicare Other | Admitting: Internal Medicine

## 2012-03-15 ENCOUNTER — Other Ambulatory Visit: Payer: Self-pay | Admitting: Internal Medicine

## 2012-03-15 ENCOUNTER — Encounter: Payer: Self-pay | Admitting: Internal Medicine

## 2012-03-15 VITALS — BP 126/68 | HR 67 | Temp 97.5°F | Resp 16 | Wt 159.0 lb

## 2012-03-15 DIAGNOSIS — Z0000 Encounter for general adult medical examination without abnormal findings: Secondary | ICD-10-CM

## 2012-03-15 DIAGNOSIS — F1010 Alcohol abuse, uncomplicated: Secondary | ICD-10-CM

## 2012-03-15 DIAGNOSIS — I421 Obstructive hypertrophic cardiomyopathy: Secondary | ICD-10-CM

## 2012-03-15 DIAGNOSIS — K7030 Alcoholic cirrhosis of liver without ascites: Secondary | ICD-10-CM

## 2012-03-15 DIAGNOSIS — D649 Anemia, unspecified: Secondary | ICD-10-CM

## 2012-03-15 DIAGNOSIS — I10 Essential (primary) hypertension: Secondary | ICD-10-CM

## 2012-03-15 DIAGNOSIS — D0371 Melanoma in situ of right lower limb, including hip: Secondary | ICD-10-CM

## 2012-03-15 DIAGNOSIS — E785 Hyperlipidemia, unspecified: Secondary | ICD-10-CM

## 2012-03-15 MED ORDER — ZOLPIDEM TARTRATE 10 MG PO TABS: 5.0000 mg | ORAL_TABLET | Freq: Every evening | ORAL | Status: DC | PRN

## 2012-03-15 NOTE — Progress Notes (Signed)
Subjective:    Patient ID: [name], male    DOB: [birth year], 78 y.o.   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 His history is significant for ED evaluation after a fall in February - note reviewed: normal eval with CT brain and c-spine   The risk factors are reflected in the social history.  The roster of all physicians providing medical care to patient - is listed in the Snapshot section of the chart.  Activities of daily living:  The patient is 100% inedpendent in all ADLs: dressing, toileting, feeding as well as independent mobility  Home safety : The patient has smoke detectors in the home. Fall - has made the home fall risk safe. They wear seatbelts. No firearms at home. There is no violence in the home.   There is no risks for hepatitis, STDs or HIV. There is no   history of blood transfusion. They have no travel history to infectious disease endemic areas of the world.  The patient has seen their dentist in the last six month. They have not seen their eye doctor in the last year. They deny any hearing difficulty and have not had audiologic testing in the last year.  They do not  have excessive sun exposure. Discussed the need for sun protection: hats, long sleeves and use of sunscreen if there is significant sun exposure.   Diet: the importance of a healthy diet is discussed. They do have a healthy diet, although eating out majority of meals.  The patient has no regular exercise program.  The benefits of regular aerobic exercise were discussed.  Depression screen: there are no signs or vegative symptoms of depression- irritability, change in appetite, anhedonia, sadness/tearfullness.  Cognitive assessment: the patient manages all their financial and personal affairs and is actively engaged. They could relate day,date,year and events; recalled 2/3 objects at 3 minutes; performed  clock-face test normally. 5 fwd, 4 rev. World -ok.  Serial 7's - ok. Change making - ok.   The following portions of the patient's history were reviewed and updated as appropriate: allergies, current medications, past family history, past medical history,  past surgical history, past social history  and problem list.  Vision, hearing, body mass index were assessed and reviewed.   During the course of the visit the patient was educated and counseled about appropriate screening and preventive services including : fall prevention , diabetes screening, nutrition counseling, colorectal cancer screening, and recommended immunizations.  Past Medical History  Diagnosis Date  . Hypertrophic cardiomyopathy     a.  echo 8/10: EF 55-65%, basal septal hypertrophy and small LVOT but no sig. gradient and no SAM, mod LVH, mild AI, mild LAE (IVS ED 13.6 mm);    b.  echo 4/12: EF  55-65%, mild LVH, Gr 1 diast dysfxn, very mild AS, mod AI,   . HTN (hypertension)   . HLD (hyperlipidemia)   . Alcoholic cirrhosis   . GERD (gastroesophageal reflux disease)   . Renal insufficiency   . Alcoholism    Past Surgical History  Procedure Date  . Inguinal hernia repair   . Melanoma excision    Family History  Problem Relation Age of Onset  . Colon cancer Father   . Heart disease Mother    History   Social History  . Marital Status: Married    Spouse Name: N/A    Number of Children: 4  .  Years of Education: 24   Occupational History  . pathologist     retired   Social History Main Topics  . Smoking status: Never Smoker   . Smokeless tobacco: Never Used  . Alcohol Use: No     member of AA  . Drug Use: No  . Sexually Active: Not on file   Other Topics Concern  . 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  Remains independent. ACP- not discussed, will approach at next OV      Review of Systems Constitutional:  Negative for fever, chills,  activity change and unexpected weight change.  HEENT:  Negative for hearing loss, ear pain, congestion, neck stiffness and postnasal drip. Negative for sore throat or swallowing problems. Negative for dental complaints.   Eyes: Negative for vision loss or change in visual acuity.  Respiratory: Negative for chest tightness and wheezing. Negative for DOE.   Cardiovascular: Negative for chest pain or palpitations. No decreased exercise tolerance Gastrointestinal: No change in bowel habit. No bloating or gas. No reflux or indigestion Genitourinary: Negative for urgency, frequency, flank pain and difficulty urinating.  Musculoskeletal: Negative for myalgias, back pain, arthralgias and gait problem.  Neurological: Negative for dizziness, tremors, weakness and headaches.  Hematological: Negative for adenopathy.  Psychiatric/Behavioral: Negative for behavioral problems and dysphoric mood.       Objective:   Physical Exam Filed Vitals:   03/15/12 1428  BP: 126/68  Pulse: 67  Temp: 97.5 F (36.4 C)  Resp: 16   Wt Readings from Last 3 Encounters:  03/15/12 159 lb (72.122 kg)  02/02/12 161 lb 6.4 oz (73.211 kg)  08/03/11 168 lb (76.204 kg)    Gen'l: Well nourished well developed white male in no acute distress  HEENT: Head: Normocephalic and atraumatic. Right Ear: External ear normal. Cerumen impaction. Left Ear: External ear normal.  Cerumen impaction.  Nose: Nose normal. Mouth/Throat: Oropharynx is clear and moist. Dentition - native, missing several teeth, broken tooth left upper premolar. No buccal or palatal lesions. Posterior pharynx clear. Eyes: Conjunctivae and sclera clear. EOM intact. Pupils are equal, round, and reactive to light. Right eye exhibits no discharge. Left eye exhibits no discharge. Neck: Normal range of motion. Neck supple. No JVD present. No tracheal deviation present. No thyromegaly present.  Cardiovascular: Normal rate, regular rhythm, no gallop, no friction rub, no  murmur heard.      Quiet precordium. 2+ radial and DP pulses . No carotid bruits Pulmonary/Chest: Effort normal. No respiratory distress or increased WOB, no wheezes, no rales. No chest wall deformity or CVAT. Abdominal: Soft. Bowel sounds are normal in all quadrants. He exhibits no distension, no tenderness, no rebound or guarding, No heptosplenomegaly  Genitourinary:  deferred Musculoskeletal: Normal range of motion. He exhibits no edema and no tenderness.       Small and large joints without redness, synovial thickening. Deformity right wrist from old fracture. Full range of motion preserved about all small, median and large joints.  Lymphadenopathy:    He has no cervical or supraclavicular adenopathy.  Neurological: He is alert and oriented to person, place, and time. CN II-XII intact. DTRs 2+ and symmetrical biceps, radial and patellar tendons. Cerebellar function normal with no tremor, rigidity, normal gait and station.  Skin: Skin is warm and dry. No rash noted. No erythema.  Psychiatric: He has a normal mood and affect. His behavior is normal. Thought content normal. Forgetfull - did not  recall ED visit in Feb '13, did not recall seeing Dr. Jarold Motto in August.   Lab Results  Component Value Date   WBC 10.0 02/02/2012   HGB 13.4 02/02/2012   HCT 40.2 02/02/2012   PLT 221.0 02/02/2012   GLUCOSE 115* 02/02/2012   CHOL 112 02/02/2012   TRIG 41.0 02/02/2012   HDL 54.40 02/02/2012   LDLCALC 49 02/02/2012   ALT 22 02/02/2012   AST 30 02/02/2012   NA 135 02/02/2012   K 4.9 02/02/2012   CL 102 02/02/2012   CREATININE 1.2 02/02/2012   BUN 23 02/02/2012   CO2 27 02/02/2012   TSH 1.07 05/26/2011   INR 1.00 03/05/2011   HGBA1C 6.1* 12/22/2007         Assessment & Plan:

## 2012-03-15 NOTE — Telephone Encounter (Signed)
Patient saw MD this afternoon for RX zolpedem

## 2012-03-17 ENCOUNTER — Encounter: Payer: Self-pay | Admitting: Internal Medicine

## 2012-03-17 DIAGNOSIS — Z0000 Encounter for general adult medical examination without abnormal findings: Secondary | ICD-10-CM | POA: Insufficient documentation

## 2012-03-17 DIAGNOSIS — C4372 Malignant melanoma of left lower limb, including hip: Secondary | ICD-10-CM | POA: Insufficient documentation

## 2012-03-17 NOTE — Assessment & Plan Note (Signed)
Lab Results  Component Value Date   CHOL 112 02/02/2012   HDL 54.40 02/02/2012   LDLCALC 49 02/02/2012   TRIG 41.0 02/02/2012   CHOLHDL 2 02/02/2012   Tolerating simvastatin w/o adverse effects  Plan Continue present medication

## 2012-03-17 NOTE — Assessment & Plan Note (Signed)
No stigmata of cirrhosis. Mentation seems generally good but there are memory lapses. Liver functions are normal.  Plan  He is encouraged to be abstemious completely  Continue AA meetings

## 2012-03-17 NOTE — Assessment & Plan Note (Signed)
Did well with surgery. Regular follow-up by dermatology

## 2012-03-17 NOTE — Assessment & Plan Note (Signed)
BP Readings from Last 3 Encounters:  03/15/12 126/68  02/02/12 118/68  01/04/12 105/43   Good control

## 2012-03-17 NOTE — Assessment & Plan Note (Signed)
Followed closely - saw Dr. Riley Kill in March '13 - stable from cardiac perspective

## 2012-03-17 NOTE — Assessment & Plan Note (Signed)
Interval medical history - surgery for malignant melanoma. He has seen both cardiology and GI during the past year but no active issues. Limited physical exam - ok. Labs in March '13 in normal range. Colorectal cancer screening - will pull old chart to establish last colonoscopy. No indication for prostate cancer screening at age 78. Immunizations - due for tetanus and pneumonia vaccine.  Summary - pleasant gentleman with medical problems as noted.He does continue to drink but feels he has this under control. Having some minor memory issues - short term recall and long term recall but performs well cognitively. Discussed sleep issues - recommended maximum of dose of zolpidem as 5 mg qhs. He is encouraged, given his multiple medical problems, to have closer follow up in IM - q 6 months.

## 2012-03-17 NOTE — Assessment & Plan Note (Signed)
Liver functions normal in March '13. No ascites on exam today. No jaundice, no tenderness on exam.  Plan Encouraged abstinence

## 2012-03-17 NOTE — Assessment & Plan Note (Signed)
Lab Results  Component Value Date   WBC 10.0 02/02/2012   HGB 13.4 02/02/2012   HCT 40.2 02/02/2012   MCV 95.5 02/02/2012   PLT 221.0 02/02/2012   Robust hemoglobin at this exam.

## 2012-05-17 ENCOUNTER — Other Ambulatory Visit: Payer: Self-pay | Admitting: Cardiology

## 2012-05-18 ENCOUNTER — Other Ambulatory Visit: Payer: Self-pay | Admitting: *Deleted

## 2012-05-18 MED ORDER — METOPROLOL SUCCINATE ER 100 MG PO TB24: ORAL_TABLET | ORAL | Status: DC

## 2012-05-18 NOTE — Telephone Encounter (Signed)
Refilled metoprolol 

## 2012-06-16 ENCOUNTER — Other Ambulatory Visit: Payer: Self-pay | Admitting: Gastroenterology

## 2012-07-15 ENCOUNTER — Other Ambulatory Visit: Payer: Self-pay

## 2012-07-18 ENCOUNTER — Other Ambulatory Visit: Payer: Self-pay | Admitting: General Practice

## 2012-07-18 MED ORDER — SIMVASTATIN 20 MG PO TABS: 20.0000 mg | ORAL_TABLET | Freq: Every day | ORAL | Status: DC

## 2012-07-29 ENCOUNTER — Other Ambulatory Visit: Payer: Self-pay | Admitting: *Deleted

## 2012-07-29 MED ORDER — ZOLPIDEM TARTRATE 10 MG PO TABS: 5.0000 mg | ORAL_TABLET | Freq: Every evening | ORAL | Status: DC | PRN

## 2012-07-29 NOTE — Telephone Encounter (Signed)
REFILL ZOLPIDEM TO BE FAXED TO [facility]

## 2012-08-15 ENCOUNTER — Other Ambulatory Visit: Payer: Self-pay | Admitting: Gastroenterology

## 2012-08-16 ENCOUNTER — Other Ambulatory Visit: Payer: Self-pay | Admitting: *Deleted

## 2012-08-16 MED ORDER — FUROSEMIDE 20 MG PO TABS: 20.0000 mg | ORAL_TABLET | Freq: Two times a day (BID) | ORAL | Status: DC

## 2012-09-13 ENCOUNTER — Other Ambulatory Visit: Payer: Self-pay | Admitting: *Deleted

## 2012-09-13 MED ORDER — METOPROLOL SUCCINATE ER 100 MG PO TB24: ORAL_TABLET | ORAL | Status: DC

## 2012-11-07 ENCOUNTER — Other Ambulatory Visit: Payer: Self-pay | Admitting: *Deleted

## 2012-11-07 MED ORDER — SPIRONOLACTONE 25 MG PO TABS: 25.0000 mg | ORAL_TABLET | Freq: Every day | ORAL | Status: DC

## 2012-11-09 HISTORY — PX: BASAL CELL CARCINOMA EXCISION: SHX1214

## 2012-11-14 ENCOUNTER — Other Ambulatory Visit: Payer: Self-pay | Admitting: *Deleted

## 2012-11-14 MED ORDER — DEXLANSOPRAZOLE 60 MG PO CPDR: 60.0000 mg | DELAYED_RELEASE_CAPSULE | Freq: Every day | ORAL | Status: DC

## 2012-11-28 ENCOUNTER — Other Ambulatory Visit: Payer: Self-pay | Admitting: Ophthalmology

## 2012-12-14 ENCOUNTER — Other Ambulatory Visit: Payer: Self-pay | Admitting: *Deleted

## 2012-12-14 NOTE — Telephone Encounter (Signed)
Patient needs an office visit for further refills  Denied refill

## 2012-12-19 ENCOUNTER — Encounter (HOSPITAL_COMMUNITY): Payer: Self-pay | Admitting: Emergency Medicine

## 2012-12-19 ENCOUNTER — Emergency Department (HOSPITAL_COMMUNITY)
Admission: EM | Admit: 2012-12-19 | Discharge: 2012-12-20 | Disposition: A | Discharge status: Home / Self Care | Payer: Medicare Other | Attending: Emergency Medicine | Admitting: Emergency Medicine

## 2012-12-19 ENCOUNTER — Emergency Department (HOSPITAL_COMMUNITY): Payer: Medicare Other

## 2012-12-19 DIAGNOSIS — K219 Gastro-esophageal reflux disease without esophagitis: Secondary | ICD-10-CM | POA: Insufficient documentation

## 2012-12-19 DIAGNOSIS — F10929 Alcohol use, unspecified with intoxication, unspecified: Secondary | ICD-10-CM

## 2012-12-19 DIAGNOSIS — R296 Repeated falls: Secondary | ICD-10-CM | POA: Insufficient documentation

## 2012-12-19 DIAGNOSIS — F10229 Alcohol dependence with intoxication, unspecified: Secondary | ICD-10-CM | POA: Insufficient documentation

## 2012-12-19 DIAGNOSIS — I10 Essential (primary) hypertension: Secondary | ICD-10-CM | POA: Insufficient documentation

## 2012-12-19 DIAGNOSIS — S1093XA Contusion of unspecified part of neck, initial encounter: Secondary | ICD-10-CM | POA: Insufficient documentation

## 2012-12-19 DIAGNOSIS — S0990XA Unspecified injury of head, initial encounter: Secondary | ICD-10-CM

## 2012-12-19 DIAGNOSIS — Z7982 Long term (current) use of aspirin: Secondary | ICD-10-CM | POA: Insufficient documentation

## 2012-12-19 DIAGNOSIS — Z8719 Personal history of other diseases of the digestive system: Secondary | ICD-10-CM | POA: Insufficient documentation

## 2012-12-19 DIAGNOSIS — Z87448 Personal history of other diseases of urinary system: Secondary | ICD-10-CM | POA: Insufficient documentation

## 2012-12-19 DIAGNOSIS — S060X9A Concussion with loss of consciousness of unspecified duration, initial encounter: Secondary | ICD-10-CM | POA: Insufficient documentation

## 2012-12-19 DIAGNOSIS — Y929 Unspecified place or not applicable: Secondary | ICD-10-CM | POA: Insufficient documentation

## 2012-12-19 DIAGNOSIS — Z8679 Personal history of other diseases of the circulatory system: Secondary | ICD-10-CM | POA: Insufficient documentation

## 2012-12-19 DIAGNOSIS — S0003XA Contusion of scalp, initial encounter: Secondary | ICD-10-CM | POA: Insufficient documentation

## 2012-12-19 DIAGNOSIS — Y939 Activity, unspecified: Secondary | ICD-10-CM | POA: Insufficient documentation

## 2012-12-19 DIAGNOSIS — Z79899 Other long term (current) drug therapy: Secondary | ICD-10-CM | POA: Insufficient documentation

## 2012-12-19 DIAGNOSIS — E785 Hyperlipidemia, unspecified: Secondary | ICD-10-CM | POA: Insufficient documentation

## 2012-12-19 LAB — CBC WITH DIFFERENTIAL/PLATELET
Basophils Absolute: 0 10*3/uL (ref 0.0–0.1)
Basophils Relative: 1 % (ref 0–1)
Eosinophils Absolute: 0.4 10*3/uL (ref 0.0–0.7)
Eosinophils Relative: 5 % (ref 0–5)
HCT: 39.7 % (ref 39.0–52.0)
Hemoglobin: 13.6 g/dL (ref 13.0–17.0)
Lymphocytes Relative: 13 % (ref 12–46)
Lymphs Abs: 1.1 10*3/uL (ref 0.7–4.0)
MCH: 31.6 pg (ref 26.0–34.0)
MCHC: 34.3 g/dL (ref 30.0–36.0)
MCV: 92.3 fL (ref 78.0–100.0)
Monocytes Absolute: 0.7 10*3/uL (ref 0.1–1.0)
Monocytes Relative: 9 % (ref 3–12)
Neutro Abs: 5.8 10*3/uL (ref 1.7–7.7)
Neutrophils Relative %: 72 % (ref 43–77)
Platelets: 190 10*3/uL (ref 150–400)
RBC: 4.3 MIL/uL (ref 4.22–5.81)
RDW: 12.8 % (ref 11.5–15.5)
WBC: 8 10*3/uL (ref 4.0–10.5)

## 2012-12-19 LAB — COMPREHENSIVE METABOLIC PANEL
ALT: 33 U/L (ref 0–53)
AST: 45 U/L — ABNORMAL HIGH (ref 0–37)
Albumin: 3.7 g/dL (ref 3.5–5.2)
CO2: 20 mEq/L (ref 19–32)
Calcium: 9.2 mg/dL (ref 8.4–10.5)
Sodium: 137 mEq/L (ref 135–145)
Total Protein: 7.6 g/dL (ref 6.0–8.3)

## 2012-12-19 LAB — RAPID URINE DRUG SCREEN, HOSP PERFORMED
Amphetamines: NOT DETECTED
Barbiturates: NOT DETECTED
Benzodiazepines: NOT DETECTED
Cocaine: NOT DETECTED
Opiates: NOT DETECTED
Tetrahydrocannabinol: NOT DETECTED

## 2012-12-19 LAB — PROTIME-INR
INR: 1.03 (ref 0.00–1.49)
Prothrombin Time: 13.4 seconds (ref 11.6–15.2)

## 2012-12-19 LAB — APTT: aPTT: 32 seconds (ref 24–37)

## 2012-12-19 LAB — ETHANOL: Alcohol, Ethyl (B): 207 mg/dL — ABNORMAL HIGH (ref 0–11)

## 2012-12-19 LAB — URINALYSIS, ROUTINE W REFLEX MICROSCOPIC
Bilirubin Urine: NEGATIVE
Glucose, UA: NEGATIVE mg/dL
Hgb urine dipstick: NEGATIVE
Specific Gravity, Urine: 1.009 (ref 1.005–1.030)
pH: 5 (ref 5.0–8.0)

## 2012-12-19 MED ORDER — SODIUM CHLORIDE 0.9 % IV BOLUS (SEPSIS)
500.0000 mL | Freq: Once | INTRAVENOUS | Status: AC
  Administered 2012-12-19: 500 mL via INTRAVENOUS

## 2012-12-19 NOTE — ED Notes (Signed)
Pt asking repetitive questions.

## 2012-12-19 NOTE — ED Notes (Signed)
Per EMS pt was found by neighbor in drive way unconscious for unknown time, pt went to store 10 minutes away and was found within 30 minutes of leaving after driving to store. Pt oriented to person. Disoriented to time, place and situation. Neighbor pulled patient inside and pt was GCS 13 upon EMS arrival, pt was confused, opening eyes to voice. Small laceration above right eye. No other obvious injury or deformity. Right wrist and are deformed at baseline.  Pt is recovering alcoholic, possible ETOH on board.

## 2012-12-19 NOTE — ED Provider Notes (Signed)
History  This chart was scribed for Loren Racer, MD by Bennett Scrape, ED Scribe. This patient was seen in room A08C/A08C and the patient's care was started at 8:07 PM.  CSN: [id number]  Arrival date & time 12/19/12  1956   First MD Initiated Contact with Patient 12/19/12 2007     Level 5 Caveat- AMS  Chief Complaint  Patient presents with  . Fall  . Loss of Consciousness     Patient is a 79 y.o. male presenting with fall. The history is provided by the patient. No language interpreter was used.  Fall The accident occurred less than 1 hour ago. The fall occurred in unknown circumstances. He fell from an unknown height. The volume of blood lost was minimal. Treatment on scene includes a backboard and a c-collar.    [name] is a 79 y.o. male brought in by ambulance in a c-collar and LSB from home, who presents to the Emergency Department for a fall with associated LOC. LSB was removed by ED staff prior to my examination. Per EMS, pt was found by a neighbor in his drive way unconscious for unknown duration of time. Wife states that the pt went to a fast food resturant 10 minutes away, returned home and was found within 30 minutes of leaving. Pt is oriented to person only which is not pt's baseline per wife. At baseline, pt walks with a cane and is oriented x4. Pt states that he remembers driving to the store but doesn't remember returning home or falling. Per EMS, pt's neighbor pulled him inside his home and called EMS. Pt has a small laceration above his right eye. The bleeding is controlled currently. He has no other obvious injuries or new deformities. He has a right wrist deformity which wife states is baseline and denies changes. Pt is a recovering alcoholic and wife reports concerns about possible ETOH consumption today. Wife also reports a h/o prior falls with head trauma. He has a h/o HTN, HLD and GERD and denies smoking and alcohol use.   Past Medical History  Diagnosis  Date  . Hypertrophic cardiomyopathy     a.  echo 8/10: EF 55-65%, basal septal hypertrophy and small LVOT but no sig. gradient and no SAM, mod LVH, mild AI, mild LAE (IVS ED 13.6 mm);    b.  echo 4/12: EF  55-65%, mild LVH, Gr 1 diast dysfxn, very mild AS, mod AI,   . HTN (hypertension)   . HLD (hyperlipidemia)   . Alcoholic cirrhosis   . GERD (gastroesophageal reflux disease)   . Renal insufficiency   . Alcoholism     Past Surgical History  Procedure Laterality Date  . Inguinal hernia repair    . Melanoma excision      Family History  Problem Relation Age of Onset  . Colon cancer Father   . Heart disease Mother     History  Substance Use Topics  . Smoking status: Never Smoker   . Smokeless tobacco: Never Used  . Alcohol Use: No     Comment: member of AA      Review of Systems  Unable to perform ROS: Mental status change    Allergies  Review of patient's allergies indicates no known allergies.  Home Medications   Current Outpatient Rx  Name  Route  Sig  Dispense  Refill  . amLODipine (NORVASC) 10 MG tablet      TAKE ONE TABLET EVERY DAY   90 tablet  1   . aspirin 325 MG EC tablet   Oral   Take 162.5 mg by mouth daily.           Marland Kitchen EXPIRED: dexlansoprazole (DEXILANT) 60 MG capsule   Oral   Take 1 capsule (60 mg total) by mouth daily.   30 capsule   6   . dexlansoprazole (DEXILANT) 60 MG capsule   Oral   Take 1 capsule (60 mg total) by mouth daily.   30 capsule   0     OFFICE VISIT NEEDED FOR FURTHER REFILLS   . furosemide (LASIX) 20 MG tablet   Oral   Take 1 tablet (20 mg total) by mouth 2 (two) times daily.   60 tablet   9   . HYDROcodone-acetaminophen (VICODIN) 2.5-500 MG per tablet   Oral   Take 1 tablet by mouth 2 (two) times daily.         . metoprolol succinate (TOPROL-XL) 100 MG 24 hr tablet      Take with or immediately following a meal.taking one and one-half daily   45 tablet   3   . simvastatin (ZOCOR) 20 MG tablet    Oral   Take 1 tablet (20 mg total) by mouth at bedtime.   90 tablet   3   . spironolactone (ALDACTONE) 25 MG tablet   Oral   Take 1 tablet (25 mg total) by mouth daily.   30 tablet   6   . zolpidem (AMBIEN) 10 MG tablet   Oral   Take 0.5 tablets (5 mg total) by mouth at bedtime as needed. For sleep. Maximum dosage is 10 mg daily.   30 tablet   5     Triage Vitals: BP 135/78  Pulse 65  Temp(Src) 96.4 F (35.8 C) (Rectal)  SpO2 97%  Physical Exam  Nursing note and vitals reviewed. Constitutional: He appears well-developed and well-nourished. No distress. Cervical collar in place.  HENT:  Head: Normocephalic.  Small laceration above the right eye  Eyes: Conjunctivae and EOM are normal.  Neck: No tracheal deviation present.  Cardiovascular: Normal rate and regular rhythm.   Pulmonary/Chest: Effort normal and breath sounds normal. No respiratory distress.  Abdominal: Soft. There is no tenderness.  Musculoskeletal: Normal range of motion.  Right wrist deformity that is baseline for pt  Neurological: He is alert.  Pt is oriented only to self  Skin: Skin is warm and dry.  Psychiatric: He has a normal mood and affect. His behavior is normal.    ED Course  Procedures (including critical care time)  DIAGNOSTIC STUDIES: Oxygen Saturation is 97% on room air, adequate by my interpretation.    COORDINATION OF CARE: 8:35 PM-Discussed treatment plan which includes CT of head and cervical spine, troponin, CBC, UA and ethnaol with pt's wife at bedside and she agreed to plan.   10:28 PM-Informed pt's family of alcohol level and that wife would need to sign pt out AMA. Wife states that she does not want to sign out AMA. Will let pt rest and recheck ethanol level in a few hours.  Labs Reviewed  COMPREHENSIVE METABOLIC PANEL - Abnormal; Notable for the following:    Glucose, Bld 107 (*)    BUN 27 (*)    AST 45 (*)    GFR calc non Af Amer 52 (*)    GFR calc Af Amer 60 (*)    All  other components within normal limits  ETHANOL - Abnormal; Notable for the  following:    Alcohol, Ethyl (B) 207 (*)    All other components within normal limits  CBC WITH DIFFERENTIAL  URINALYSIS, ROUTINE W REFLEX MICROSCOPIC  PROTIME-INR  APTT  URINE RAPID DRUG SCREEN (HOSP PERFORMED)  TROPONIN I   Ct Head Wo Contrast  12/19/2012  *RADIOLOGY REPORT*  Clinical Data:  Fall.  Loss of consciousness.  CT HEAD WITHOUT CONTRAST CT CERVICAL SPINE WITHOUT CONTRAST  Technique:  Multidetector CT imaging of the head and cervical spine was performed following the standard protocol without intravenous contrast.  Multiplanar CT image reconstructions of the cervical spine were also generated.  Comparison:  01/04/2012  CT HEAD  Findings: The brain stem, cerebellum, cerebral peduncles, thalami, basal ganglia, basilar cisterns, and ventricular system appear unremarkable.  No intracranial hemorrhage, mass lesion, or acute infarction is identified.  Chronic bilateral maxillary sinusitis noted.  IMPRESSION:  1.  Chronic bilateral maxillary sinusitis.   Otherwise, no significant abnormality identified.  CT CERVICAL SPINE  Findings: Degenerative loss of articular space of the anterior C1-2 articulation noted.  Erosions and spurring are present.  Facet arthropathy noted bilaterally at C2-3 and C3-4, particularly on the left at C3-4 where there is spurring and articular surface irregularity.  Uncinate and facet spurring cause osseous foraminal narrowing on the left at C6-7.  Loss of disc height noted at C5-6 and C6-7 with posterior osseous ridging.  No cervical spine fracture or acute subluxation is observed. Dextroconvex cervical scoliosis noted.  Suspected disc bulges noted at C2-3 and C3-4.  IMPRESSION:  1.  Cervical spondylosis and degenerative disc disease as detailed above, without acute cervical spine fracture or acute subluxation.   Original Report Authenticated By: Gaylyn Rong, M.D.    Ct Cervical Spine Wo  Contrast  12/19/2012  *RADIOLOGY REPORT*  Clinical Data:  Fall.  Loss of consciousness.  CT HEAD WITHOUT CONTRAST CT CERVICAL SPINE WITHOUT CONTRAST  Technique:  Multidetector CT imaging of the head and cervical spine was performed following the standard protocol without intravenous contrast.  Multiplanar CT image reconstructions of the cervical spine were also generated.  Comparison:  01/04/2012  CT HEAD  Findings: The brain stem, cerebellum, cerebral peduncles, thalami, basal ganglia, basilar cisterns, and ventricular system appear unremarkable.  No intracranial hemorrhage, mass lesion, or acute infarction is identified.  Chronic bilateral maxillary sinusitis noted.  IMPRESSION:  1.  Chronic bilateral maxillary sinusitis.   Otherwise, no significant abnormality identified.  CT CERVICAL SPINE  Findings: Degenerative loss of articular space of the anterior C1-2 articulation noted.  Erosions and spurring are present.  Facet arthropathy noted bilaterally at C2-3 and C3-4, particularly on the left at C3-4 where there is spurring and articular surface irregularity.  Uncinate and facet spurring cause osseous foraminal narrowing on the left at C6-7.  Loss of disc height noted at C5-6 and C6-7 with posterior osseous ridging.  No cervical spine fracture or acute subluxation is observed. Dextroconvex cervical scoliosis noted.  Suspected disc bulges noted at C2-3 and C3-4.  IMPRESSION:  1.  Cervical spondylosis and degenerative disc disease as detailed above, without acute cervical spine fracture or acute subluxation.   Original Report Authenticated By: Gaylyn Rong, M.D.      No diagnosis found.   Date: 12/19/2012  Rate: 66  Rhythm: normal sinus rhythm  QRS Axis: normal  Intervals: QRS prolonged  ST/T Wave abnormalities: nonspecific T wave changes  Conduction Disutrbances:right bundle branch block  Narrative Interpretation:   Old EKG Reviewed: unchanged    MDM  I  personally performed the services  described in this documentation, which was scribed in my presence. The recorded information has been reviewed and is accurate.    Loren Racer, MD 12/20/12 2356

## 2012-12-19 NOTE — ED Notes (Signed)
MD notified of rectal temperature.

## 2012-12-19 NOTE — ED Notes (Signed)
Pt transported to radiology.

## 2012-12-19 NOTE — ED Notes (Signed)
Phlebotomy at bedside.

## 2012-12-19 NOTE — ED Notes (Signed)
MD at bedside. 

## 2012-12-20 ENCOUNTER — Ambulatory Visit (INDEPENDENT_AMBULATORY_CARE_PROVIDER_SITE_OTHER): Payer: Medicare Other | Admitting: Internal Medicine

## 2012-12-20 ENCOUNTER — Encounter: Payer: Self-pay | Admitting: Internal Medicine

## 2012-12-20 VITALS — BP 132/70 | HR 96 | Temp 97.0°F | Resp 10 | Wt 164.0 lb

## 2012-12-20 DIAGNOSIS — Z5189 Encounter for other specified aftercare: Secondary | ICD-10-CM

## 2012-12-20 DIAGNOSIS — S0990XD Unspecified injury of head, subsequent encounter: Secondary | ICD-10-CM

## 2012-12-20 DIAGNOSIS — F1010 Alcohol abuse, uncomplicated: Secondary | ICD-10-CM

## 2012-12-20 LAB — ETHANOL: Alcohol, Ethyl (B): 160 mg/dL — ABNORMAL HIGH (ref 0–11)

## 2012-12-20 NOTE — Patient Instructions (Addendum)
Post fall dangers: watch for late concussion symptoms: headache, confusion, fatigue. Watch for the rare possibility of slowly developing subdural: word finding difficulty, change in speech, increased somnolence. For any of these symptoms please return.  Wound care: wash gently with soap and water, pat dry and apply large bandaide. Watch for signs of superficial infection: fever, streaking, exudate.  Alcoholism - a relentless disease that requires constant vigilance and hard work. Please continue with AA, work your recovery with your sponsor. Do not drive impaired!!!!!!  Return if I can be of any assistance.    Traumatic Brain Injury Traumatic brain injury (TBI) occurs when an injury to the head causes the brain to move back and forth. The risk of brain injury varies with the severity of the trauma. The damage can be confined to one area of the brain (focal) or involve different areas of the brain (diffuse). The severity of a brain injury can range from:  A blow or jolt to the head that disrupts the normal function of the brain (concussion).  A deep state of unconsciousness (coma).  Death. CAUSES  A brain injury can result from:  A closed head injury. This occurs when the head suddenly and violently hits an object but the object does not break through the skull. Examples include:  A direct blow (hitting your head on a hard surface).  An indirect blow (when your head moves rapidly and violently back and forth like in a car crash). This injury is called countrecoup (involving a blow and counter blow). Shaken baby syndrome is a severe type of this injury. It happens when a baby is shaken forcibly enough to cause extreme countrecoup injury.  Penetrating head injury. A penetrating head injury occurs when an object pierces the skull and enters the brain tissue. Examples include:  A skull fracture occurs when the skull cracks or breaks.  A depressed skull fracture occurs when pieces of the  broken skull press into the tissue of the brain. This can cause bruising of the brain tissue called a contusion. In both closed and penetrating head injuries, damage to blood vessels can cause heavy bleeding into or around the brain.  SYMPTOMS  The symptoms of a TBI depend on the type and severity of the injury. The most common symptoms include:   Confusion (disorientation) or other thinking problems.  An inability to remember events around the time of the injury (amnesia).  Difficulty staying awake or passing out (loss of consciousness).  Headache.  Blurry vision.  Vomiting.  Seizures.  Swelling of the scalp. This occurs because of bleeding or swelling under the skin of the skull when the head is hit. TREATMENT Treatment of traumatic brain injury can involve a range of different medical options:  If a brain injury is moderate to severe, a hospital stay will be necessary to monitor:  Neurological status.  Pressure or swelling of the brain (intracranial pressure).  For seizures.  Severe brain injury cases may need surgery to:  Control bleeding.  Relieve pressure on the brain.  Remove objects from the brain that result from a penetrating injury.  Repair the skull from an injury.  Long term treatment of a brain injury can involve rehabilitation work such as:  Physical therapy.  Occupational therapy.  Speech therapy. PROGNOSIS The outcome of TBI depends on the cause of the injury, location, severity, and extent of neurological damage. Outcomes range from good recovery to death. Long term consequences of a TBI can include:  Difficulty concentrating or  having a short attention span.  Change in personality.  Irritability.  Headaches.  Blurry vision.  Sleepiness.  Depression.  Unsteadiness that makes walking or standing hard to do. For more information and support, contact: [employer].  Document Released:  10/16/2002 Document Revised: 01/18/2012 Document Reviewed: 10/24/2009 [facility] Patient Information 2013 [place], [place].

## 2012-12-20 NOTE — ED Notes (Addendum)
Pt stated multiple times that he was ready to go home and that he should be up for discharge.  Pt made aware the the MD had not placed pt up for discharge at this time, and he was not medically cleared at this time to go home.  Wife stated that she would not sign pt out AMA at this time, and that she did not feel he was ready to go home.  Pt convinced to get back in bed, and wait for the EDP to finish pt evaluation at this time.

## 2012-12-20 NOTE — ED Provider Notes (Signed)
Received from Dr. Ranae Palms. Patient with fall tonight, noted to have alcohol intoxication. Patient is pleasant, back to his baseline. Based on labs drawn 11 PM. He should be sober by 2 AM. Patient is able to ambulate through the ER without difficulty. Wife is here to take him home.  Olivia Mackie, MD 12/20/12 [phone]

## 2012-12-20 NOTE — ED Notes (Signed)
Per MD order, pt ambulated down hallway with help of NT.  Pt able to ambulate under own power with minimal assistance from others.  MD made aware

## 2012-12-20 NOTE — ED Notes (Signed)
EDP in room with pt 

## 2012-12-21 NOTE — Assessment & Plan Note (Signed)
Significant relapse. By his and his wife's report he has been drinking for the past two months. He has had a fall and injury. He has been driving impaired.  Plan He is encouraged to continue AA at least 3/wk and to work with his sponsor.  He is admonished to NOT DRIVE while impaired.

## 2012-12-21 NOTE — Progress Notes (Signed)
Subjective:    Patient ID: [name], male    DOB: 1929-05-29, 79 y.o.   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 Records reviewed: CT head and cervical spine - normal, w/o fracture or dislocation. ED eval was stable and he was allowed to return home.  He admits that he has been drinking. He has had several falls. He did fall in the driveway sustaining an abrasion to the forehead but had no LOC. He required assistance to get inside where he had a second fall.   At this time he denies severe headache, double vision, word finding difficulty or speech difficulty, excessive somnolence, discordination  Past Medical History  Diagnosis Date  . Hypertrophic cardiomyopathy     a.  echo 8/10: EF 55-65%, basal septal hypertrophy and small LVOT but no sig. gradient and no SAM, mod LVH, mild AI, mild LAE (IVS ED 13.6 mm);    b.  echo 4/12: EF  55-65%, mild LVH, Gr 1 diast dysfxn, very mild AS, mod AI,   . HTN (hypertension)   . HLD (hyperlipidemia)   . Alcoholic cirrhosis   . GERD (gastroesophageal reflux disease)   . Renal insufficiency   . Alcoholism    Past Surgical History  Procedure Laterality Date  . Inguinal hernia repair    . Melanoma excision     Family History  Problem Relation Age of Onset  . Colon cancer Father   . Heart disease Mother    History   Social History  . Marital Status: Married    Spouse Name: N/A    Number of Children: 4  . Years of Education: 24   Occupational History  . pathologist     retired   Social History Main Topics  . Smoking status: Never Smoker   . Smokeless tobacco: Never Used  . Alcohol Use: No     Comment: member of AA  . Drug Use: No  . Sexually Active: Not on file   Other Topics Concern  . 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  Remains  independent. ACP- not discussed, will approach at next OV    Current Outpatient Prescriptions on File Prior to Visit  Medication Sig Dispense Refill  . amLODipine (NORVASC) 10 MG tablet TAKE ONE TABLET EVERY DAY  90 tablet  1  . aspirin 325 MG EC tablet Take 162.5 mg by mouth daily.        . furosemide (LASIX) 20 MG tablet Take 20 mg by mouth daily.      Marland Kitchen HYDROcodone-acetaminophen (VICODIN) 2.5-500 MG per tablet Take 1 tablet by mouth 2 (two) times daily.      . metoprolol succinate (TOPROL-XL) 100 MG 24 hr tablet Take with or immediately following a meal.taking one and one-half daily  45 tablet  3  . Multiple Vitamin (MULTIVITAMIN WITH MINERALS) TABS Take 1 tablet by mouth daily.      . simvastatin (ZOCOR) 20 MG tablet Take 1 tablet (20 mg total) by mouth at bedtime.  90 tablet  3  . spironolactone (ALDACTONE) 25 MG tablet Take 1 tablet (25 mg total) by mouth daily.  30 tablet  6  . zolpidem (AMBIEN) 10 MG tablet Take 0.5 tablets (5 mg total) by mouth at bedtime as needed. For sleep. Maximum  dosage is 10 mg daily.  30 tablet  5  . dexlansoprazole (DEXILANT) 60 MG capsule Take 1 capsule (60 mg total) by mouth daily.  30 capsule  6   No current facility-administered medications on file prior to visit.      Review of Systems System review is negative for any constitutional, cardiac, pulmonary, GI or neuro symptoms or complaints other than as described in the HPI.     Objective:   Physical Exam Filed Vitals:   12/20/12 1603  BP: 132/70  Pulse: 96  Temp: 97 F (36.1 C)  Resp: 10   Wt Readings from Last 3 Encounters:  12/20/12 164 lb 0.6 oz (74.408 kg)  03/15/12 159 lb (72.122 kg)  02/02/12 161 lb 6.4 oz (73.211 kg)   Gen'l - WNWD older white man in no distress HEENT - large abrasion - 3x5 cm right forehead, no hematoma. C&S clear, PERRLA Cor- RRR Pulm - normal respirations Neuro - A&O x 3, CN II-XII grossly normal, MS- 5/5, cerebellar - no tremor,  gait steady with use of  cane       Assessment & Plan:  Head trauma - Post fall dangers: watch for late concussion symptoms: headache, confusion, fatigue. Watch for the rare possibility of slowly developing subdural: word finding difficulty, change in speech, increased somnolence. For any of these symptoms please return.  Wound care: wash gently with soap and water, pat dry and apply large bandaide. Watch for signs of superficial infection: fever, streaking, exudate.

## 2013-01-11 ENCOUNTER — Other Ambulatory Visit: Payer: Self-pay | Admitting: *Deleted

## 2013-01-11 MED ORDER — METOPROLOL SUCCINATE ER 100 MG PO TB24: ORAL_TABLET | ORAL | Status: DC

## 2013-01-12 ENCOUNTER — Ambulatory Visit (HOSPITAL_COMMUNITY)
Admission: RE | Admit: 2013-01-12 | Discharge: 2013-01-12 | Disposition: A | Discharge status: Home / Self Care | Payer: 59 | Attending: Psychiatry | Admitting: Psychiatry

## 2013-01-12 DIAGNOSIS — F1010 Alcohol abuse, uncomplicated: Secondary | ICD-10-CM | POA: Insufficient documentation

## 2013-01-12 NOTE — BH Assessment (Signed)
Assessment Note   [name] is an 79 y.o. male. PT SPOKE WITH ANN EVANS IN CD-IOP REGARDING PROGRAM.  HE WAS SCHEDULED FOR APPOINTMENT TODAY. PT REPORTS HE STARTED DRINKING AT AGE 68 AND USUALLY DRINKS 2-3 DRINKS PER DAY.  HE STOPPED DRINKING 2 WEEKS AGO AND NOW WANTS COPING SKILLS TO HELP HIM CONTINUE STAYING SOBER.  HE REPORTS A PRIOR [facility]OX AT [facility] ABOUT 5 YEARS AGO BUT NO FOLLOW-UP UPON DISCHARGE.  HE DENIES S/I, H/I AND IS NOT PSYCHOTIC NOR DELUSIONAL.  HE WAS GIVEN SUICIDE PRECAUTION PAMPHLET and pamphlet regarding the CD-IOP PROGRAM.  DUE TO IOP APPOINTMENT NO MSE IS NECESSARY. PT IS TO MEET WITH ANN EVANS ON 01/13/13 AT 9AM AND START THE CD-IOP PROGRAM AT 1PM.  PT AGREES TO PARTICIPATE. SEE SIGNED CONSENT TO RELEASE INFORMATION FORM.  Axis I: Alcohol Abuse Axis II: Deferred Axis III:  Past Medical History  Diagnosis Date  . Hypertrophic cardiomyopathy     a.  echo 8/10: EF 55-65%, basal septal hypertrophy and small LVOT but no sig. gradient and no SAM, mod LVH, mild AI, mild LAE (IVS ED 13.6 mm);    b.  echo 4/12: EF  55-65%, mild LVH, Gr 1 diast dysfxn, very mild AS, mod AI,   . HTN (hypertension)   . HLD (hyperlipidemia)   . Alcoholic cirrhosis   . GERD (gastroesophageal reflux disease)   . Renal insufficiency   . Alcoholism    Axis IV: problems related to social environment Axis V: 51-60 moderate symptoms  Past Medical History:  Past Medical History  Diagnosis Date  . Hypertrophic cardiomyopathy     a.  echo 8/10: EF 55-65%, basal septal hypertrophy and small LVOT but no sig. gradient and no SAM, mod LVH, mild AI, mild LAE (IVS ED 13.6 mm);    b.  echo 4/12: EF  55-65%, mild LVH, Gr 1 diast dysfxn, very mild AS, mod AI,   . HTN (hypertension)   . HLD (hyperlipidemia)   . Alcoholic cirrhosis   . GERD (gastroesophageal reflux disease)   . Renal insufficiency   . Alcoholism     Past Surgical History  Procedure Laterality Date  . Inguinal hernia repair     . Melanoma excision      Family History:  Family History  Problem Relation Age of Onset  . Colon cancer Father   . Heart disease Mother     Social History:  reports that he has never smoked. He has never used smokeless tobacco. He reports that he does not drink alcohol or use illicit drugs.  Additional Social History:  Alcohol / Drug Use Pain Medications: na Prescriptions: na Over the Counter: na History of alcohol / drug use?: Yes Substance #1 Name of Substance 1: alcohol 1 - Age of First Use: 72 1 - Amount (size/oz): 2-3 drinks   1 - Frequency: per night 1 - Duration: 10 yrs 1 - Last Use / Amount: 2 weeks ago 2-3 drinks  CIWA:   COWS:    Allergies: No Known Allergies  Home Medications:  (Not in a hospital admission)  OB/GYN Status:  No LMP for male patient.  General Assessment Data Location of Assessment: [facility] Living Arrangements: Spouse/significant other Can pt return to current living arrangement?: Yes Admission Status: Voluntary Is patient capable of signing voluntary admission?: Yes Transfer from: Home Referral Source: Self/Family/Friend  Education Status Contact person: JEANNE Clarey-[phone]  Risk to self Suicidal Ideation: No Suicidal Intent: No Is patient  at risk for suicide?: No Suicidal Plan?: No Access to Means: No What has been your use of drugs/alcohol within the last 12 months?: ALCOHOL Previous Attempts/Gestures: No How many times?: 0 Other Self Harm Risks: NA Triggers for Past Attempts: None known Intentional Self Injurious Behavior: None Family Suicide History: No Recent stressful life event(s): Other (Comment) (NONE) Persecutory voices/beliefs?: No Depression: No Depression Symptoms:  (NONE) Substance abuse history and/or treatment for substance abuse?: Yes Suicide prevention information given to non-admitted patients: Yes  Risk to Others Homicidal Ideation: No Thoughts of Harm to Others:  No Current Homicidal Intent: No Current Homicidal Plan: No Access to Homicidal Means: No Identified Victim: NA History of harm to others?: No Assessment of Violence: None Noted Violent Behavior Description: NA Does patient have access to weapons?: No Criminal Charges Pending?: No Does patient have a court date: No  Psychosis Hallucinations: None noted Delusions: None noted  Mental Status Report Appear/Hygiene: Improved Eye Contact: Good Motor Activity: Freedom of movement;Unsteady Speech: Logical/coherent Level of Consciousness: Alert Mood:  (APPROPRIATE) Affect:  (APPROPRIATE) Anxiety Level: None Thought Processes: Coherent;Relevant Judgement: Unimpaired Orientation: Person;Place;Time;Situation Obsessive Compulsive Thoughts/Behaviors: None  Cognitive Functioning Concentration: Normal Memory: Recent Intact;Remote Intact IQ: Average Insight: Good Impulse Control: Fair Appetite: Good Weight Loss: 0 Weight Gain: 0 Sleep: No Change Total Hours of Sleep: 9 Vegetative Symptoms: None  ADLScreening [facility]) Patient's cognitive ability adequate to safely complete daily activities?: Yes Patient able to express need for assistance with ADLs?: Yes Independently performs ADLs?: Yes (appropriate for developmental age)  Abuse/Neglect [facility]) Physical Abuse: Denies Verbal Abuse: Denies Sexual Abuse: Denies  Prior Inpatient Therapy Prior Inpatient Therapy: Yes Prior Therapy Dates: 5 YEARS AGO Prior Therapy Facilty/Derricka Mertz(s): [facility] Reason for Treatment: DETOX  Prior Outpatient Therapy Prior Outpatient Therapy: No Prior Therapy Dates: NA Prior Therapy Facilty/Finn Amos(s): NA Reason for Treatment: NA  ADL Screening (condition at time of admission) Patient's cognitive ability adequate to safely complete daily activities?: Yes Patient able to express need for assistance with ADLs?: Yes Independently performs ADLs?: Yes (appropriate for  developmental age) Weakness of Legs: Both Weakness of Arms/Hands: None  Home Assistive Devices/Equipment Home Assistive Devices/Equipment: Cane (specify quad or straight)  Therapy Consults (therapy consults require a physician order) PT Evaluation Needed: No OT Evalulation Needed: No SLP Evaluation Needed: No Abuse/Neglect Assessment (Assessment to be complete while patient is alone) Physical Abuse: Denies Verbal Abuse: Denies Sexual Abuse: Denies Exploitation of patient/patient's resources: Denies Self-Neglect: Denies Values / Beliefs Cultural Requests During Hospitalization: None Spiritual Requests During Hospitalization: None Consults Spiritual Care Consult Needed: No Social Work Consult Needed: No Merchant navy officer (For Healthcare) Advance Directive: Patient does not have advance directive;Patient would not like information Pre-existing out of facility DNR order (yellow form or pink MOST form): No    Additional Information 1:1 In Past 12 Months?: No CIRT Risk: No Elopement Risk: No Does patient have medical clearance?: No     Disposition: REFERRED TO CD-IOP Disposition Initial Assessment Completed: Yes Disposition of Patient: Outpatient treatment Type of outpatient treatment: Chemical Dependence - Intensive Outpatient  On Site Evaluation by:   Reviewed with Physician:     Hattie Perch Winford 01/12/2013 4:31 PM

## 2013-01-18 ENCOUNTER — Other Ambulatory Visit (HOSPITAL_COMMUNITY): Payer: Medicare Other | Attending: Psychiatry | Admitting: Psychology

## 2013-01-18 DIAGNOSIS — F1020 Alcohol dependence, uncomplicated: Secondary | ICD-10-CM

## 2013-01-19 ENCOUNTER — Other Ambulatory Visit (HOSPITAL_COMMUNITY): Payer: 59

## 2013-01-20 ENCOUNTER — Other Ambulatory Visit (HOSPITAL_COMMUNITY): Payer: 59 | Attending: Psychiatry | Admitting: Psychology

## 2013-01-20 DIAGNOSIS — F1020 Alcohol dependence, uncomplicated: Secondary | ICD-10-CM | POA: Insufficient documentation

## 2013-01-20 DIAGNOSIS — F1920 Other psychoactive substance dependence, uncomplicated: Secondary | ICD-10-CM

## 2013-01-21 LAB — PRESCRIPTION ABUSE MONITORING 17P, URINE
Amphetamine/Meth: NEGATIVE ng/mL
Benzodiazepine Screen, Urine: NEGATIVE ng/mL
Buprenorphine, Urine: NEGATIVE ng/mL
Cannabinoid Scrn, Ur: NEGATIVE ng/mL
Carisoprodol, Urine: NEGATIVE ng/mL
Propoxyphene: NEGATIVE ng/mL
Tapentadol, urine: NEGATIVE ng/mL

## 2013-01-21 LAB — ALCOHOL METABOLITE (ETG), URINE

## 2013-01-22 ENCOUNTER — Encounter (HOSPITAL_COMMUNITY): Payer: Self-pay | Admitting: Psychology

## 2013-01-22 NOTE — Progress Notes (Unsigned)
    Daily Group Progress Note  Program: CD-IOP   Group Time: 1-2:30 pm  Participation Level: Minimal  Behavioral Response: Appropriate and Sharing  Type of Therapy: Process Group  Topic: Group Process: First part of group was spent in process. Members shared about their successes and struggles in early recovery. Every member had attended at least one 12-step meetings since the last group session. There was a new member in group and she introduced herself. She had a lot of problems and issues to report.   Group Time: 2:45- 4pm  Participation Level: Active  Behavioral Response: Sharing  Type of Therapy: Psycho-education Group  Topic: Psycho-Ed: After break, a psycho-ed piece was provided on "Resentments". The presentation included identifying how resentments develop and how they can be avoided by addressing disappointment and anger earlier. The damage or problems that resentments can create and the dangers they represent for one in recovery was discussed at length. Almost all members admitted to holding onto resentments and members differed on why or even if they should give them up. There was good discussion and feedback during the session and the intervention proved effective.   Summary: The patient reported he had attended an AA meeting Monday evening. He is very Therapist, music with that meeting and has gone there for a long time. The patient was attentive and asked questions of his fellow members and provided good feedback. The patient shared about some of his children and noted that his daughter lives in [place], [place], his youngest son lives in [place], [place] his oldest child is a judge here in [place]. The patient admitted he had resentments and they had only fed his drinking. The patient quoted the [employer] on page 19 about resentments and displayed a recongition that they can be very destructive. The patient remains engaged and attentive and reported his sobriety date remains 2/1. Family  Program: Family present? No   Name of family member(s):   UDS collected: No Results:   AA/NA attended?: [place]  Sponsor?: No   Mable Lashley, LCAS

## 2013-01-23 ENCOUNTER — Encounter (HOSPITAL_COMMUNITY): Payer: Self-pay | Admitting: Psychology

## 2013-01-23 ENCOUNTER — Other Ambulatory Visit (HOSPITAL_COMMUNITY): Payer: 59 | Attending: Psychiatry | Admitting: Psychology

## 2013-01-23 DIAGNOSIS — F1020 Alcohol dependence, uncomplicated: Secondary | ICD-10-CM

## 2013-01-23 DIAGNOSIS — F1010 Alcohol abuse, uncomplicated: Secondary | ICD-10-CM | POA: Insufficient documentation

## 2013-01-23 NOTE — Progress Notes (Signed)
    Daily Group Progress Note  Program: CD-IOP   Group Time: 1-2:30 pm  Participation Level: Minimal  Behavioral Response: Sharing  Type of Therapy: Process Group  Topic: Group Process: first part of group was spent in process. Members shared about their current struggles in early recovery. There was a new member in group and she introduced herself briefly. There was good disclosure among members. As the session continued, the medical director met with all new group members.   Group Time: 2:45- 4pm  Participation Level: Active  Behavioral Response: Appropriate and Sharing  Type of Therapy: Psycho-education Group  Topic: "The Recovery Pie": The second part of group was spent in a presentation on The Recovery Pie, which includes 8 basic elements in recovery. As I drew a pie with 8 segments or pieces on the board, group members identified the different parts. The importance of each one was emphasized and new members were instructed to begin to incorporate these elements into their daily lives. The importance of building a strong routine was reiterated and there was good insight shared by members who have entered these elements into their lives and the benefits of this consistent structure. The elements of this Recovery Pie include: 12-step meetings, working with a sponsor and talking with new friends in recovery, spirituality, exercise, diet, sleep, recreation, and [place].   Summary: The patient participated in group discussion about ways to stay sober. He has good insight for other members but continues to struggle with really expressing his own feelings. He seems unwilling to try different AA meetings and doesn't share about himself or problems with alcohol in the meetings.  Pt participated in wheel of life group exercise and expressed that he is meeting most of those pretty well. However, he cannot explain why he continues to drink sporadically, despite accepting his alcoholism.   Family  Program: Family present? No   Name of family member(s):   UDS collected: Yes Results: positive for benzodiazepines  AA/NA attended?: YesMonday  Sponsor?: No   Nazaria Riesen, LCAS

## 2013-01-24 ENCOUNTER — Encounter (HOSPITAL_COMMUNITY): Payer: Self-pay | Admitting: Psychology

## 2013-01-24 ENCOUNTER — Other Ambulatory Visit (HOSPITAL_COMMUNITY): Payer: 59

## 2013-01-24 ENCOUNTER — Other Ambulatory Visit: Payer: Self-pay | Admitting: *Deleted

## 2013-01-24 LAB — OPIATES/OPIOIDS (LC/MS-MS)
Codeine Urine: NEGATIVE ng/mL
Hydromorphone: NEGATIVE ng/mL
Morphine Urine: NEGATIVE ng/mL
Norhydrocodone, Ur: 131 ng/mL
Noroxycodone, Ur: NEGATIVE ng/mL

## 2013-01-24 LAB — ETHYL GLUCURONIDE, URINE: Ethyl Glucuronide (EtG): 7016 ng/mL — ABNORMAL HIGH

## 2013-01-24 MED ORDER — AMLODIPINE BESYLATE 10 MG PO TABS: 10.0000 mg | ORAL_TABLET | Freq: Every day | ORAL | Status: DC

## 2013-01-24 NOTE — Progress Notes (Addendum)
    Daily Group Progress Note  Program: CD-IOP   Group Time: 1-2:30 pm  Participation Level: Active  Behavioral Response: Sharing, resistant  Type of Therapy: Process Group  Topic: Group Process: first part of group was spent in process. Members shared about the past weekend and things that they had done to support their recovery. One member did not attend any meetings over the weekend and I challenged her on her resistance to treatment. She agreed that she would attend a meeting tonight.    Group Time: 2:45- 4pm  Participation Level: Active  Behavioral Response: Sharing  Type of Therapy: Activity Group  Topic: "The Wheel of Life". An activity was presented after the break. Members were provided a handout and asked to chart where they stand in each of the 8 categories identified. One by one they came up to the board and charted where they were. Each member described how they had rated their happiness or success in each area and, when lacking, they were provided good feedback on how they might address and improve their standing. This activity provided a deeper and more insightful understanding of each of the group members.   Summary: The patient reported that he had drank on Friday evening. He reported he had gone to a bar on the way to the 8 pm AA meeting and drank a double vodka. He proceeded to the meeting, but never said anything to anyone about having had a drink less than 30 minutes before the meeting. He didn't have any explanation as to why he did other than, "I want to prove them wrong and that I can drink". The patient displayed either a total lack of insight or the unwillingness to consider what may have been going on in his head prior to leaving home for the meeting. In the second half of group, he drew his wheel and explained that everything was pretty good in his life. He shared about his children, including having a 77 yo daughter with severe Down Syndrome. The patient  provides feedback and asks good questions, but is very resistant and doesn't seem open or willing to consider making changes. I requested taht since he most often drinks at night when he has gone out to a meeting, he begin to attend AA meetings in the daytime and stay home at night. He scoffed at this suggestion. He also admitted to the group that he has continued to take his Ambien and despite explaining that he only takes 1/2 a pill, he is not allowed any Ambien medication. He stated he didn't remember this, but it will be counted as a relapse. The sleep medication taken in violation of the program policy and now the double vodka Friday night represent 2 violations or relapses. A third will mean discharge and referral to residential treatment.  Family Program: Family present? No   Name of family member(s):   UDS collected: No Results:   AA/NA attended?: not since Friday night  Sponsor?: Yes, but he has never spoken with him and he has been instructed to secure a new sponsor   Merrit Friesen, LCAS

## 2013-01-25 ENCOUNTER — Other Ambulatory Visit (HOSPITAL_COMMUNITY): Payer: 59 | Attending: Psychiatry | Admitting: Psychology

## 2013-01-25 DIAGNOSIS — F1020 Alcohol dependence, uncomplicated: Secondary | ICD-10-CM

## 2013-01-25 DIAGNOSIS — F1010 Alcohol abuse, uncomplicated: Secondary | ICD-10-CM | POA: Insufficient documentation

## 2013-01-25 DIAGNOSIS — F1920 Other psychoactive substance dependence, uncomplicated: Secondary | ICD-10-CM

## 2013-01-25 LAB — ZOLPIDEM (LC/MS-MS), URINE: Zolpidem metabolite (GC/LC/MS) Ur, confirm: 1247 ng/mL — ABNORMAL HIGH

## 2013-01-26 ENCOUNTER — Other Ambulatory Visit (HOSPITAL_COMMUNITY): Payer: 59

## 2013-01-26 ENCOUNTER — Encounter (HOSPITAL_COMMUNITY): Payer: Self-pay | Admitting: Psychology

## 2013-01-26 LAB — PRESCRIPTION ABUSE MONITORING 17P, URINE
Amphetamine/Meth: NEGATIVE ng/mL
Barbiturate Screen, Urine: NEGATIVE ng/mL
Benzodiazepine Screen, Urine: NEGATIVE ng/mL
Buprenorphine, Urine: NEGATIVE ng/mL
Carisoprodol, Urine: NEGATIVE ng/mL
Fentanyl, Ur: NEGATIVE ng/mL
MDMA URINE: NEGATIVE ng/mL
Meperidine, Ur: NEGATIVE ng/mL
Oxycodone Screen, Ur: NEGATIVE ng/mL
Propoxyphene: NEGATIVE ng/mL

## 2013-01-26 LAB — ALCOHOL METABOLITE (ETG), URINE

## 2013-01-26 NOTE — Progress Notes (Signed)
    Daily Group Progress Note  Program: CD-IOP   Group Time: 1-2:30 pm  Participation Level: Minimal  Behavioral Response: Sharing, resistant  Type of Therapy: Psycho-education Group  Topic: Pharmacist: First part of group was spent with a visit from the pharmacist. The pharmacist reviewed the different categories of drugs and described the various types of medications that are prescribed to address specific illnesses. There was a good exchange between members and the guest speaker and the session proved very informative.   Group Time: 2:45- 4pm Participation Level: Minimal  Behavioral Response: Sharing  Type of Therapy: Process Group  Topic: Group Process/Graduation: the second half of group was spent in process. Members shared about current struggles and issues in recovery. A new group member was present today and he disclosed the events that have brought him to treatment. The group provided good feedback and the patient responded favorably. Near the end of the session, a graduation ceremony was held for one of the group members graduating successfully from the program today. Kind words and hopes were shared and the member received a warm farewell from his fellow group members.    Summary: The patient reported he had stayed home from his regular Monday night meeting because of the sleet. He admitted he hadn't done anything but sit at home with his wife yesterday and did not attend any meetings. He brushed off the question about why he hadn't gone to a meeting and stated he hadn't really thought about it. He challenged the pharmacist at one point about her opinion on Ambien and noted he had taken it and never had any problems. In process, the patient made some comments about AA, but I pointed out he had never worked the program, only attended meetings sporadically, and sometimes attended them after drinking. The patient bid the graduating member farewell. The patient appears very resistant  towards making any changes in his current schedule, despite my requests that he do so. I have asked him to attend meetings during the daytime and not the nighttime since he frequently stops at a bar on the way to or after a night meeting. We will continue to follow closely in the days ahead.    Family Program: Family present? No   Name of family member(s):   UDS collected: Yes Results: positive for benzodiazepines, narcotics and Ambien  AA/NA attended?: No, not since last Friday evening  Sponsor?: No   Mavery Milling, LCAS

## 2013-01-27 ENCOUNTER — Other Ambulatory Visit (HOSPITAL_COMMUNITY): Payer: 59 | Admitting: Psychology

## 2013-01-27 DIAGNOSIS — F1020 Alcohol dependence, uncomplicated: Secondary | ICD-10-CM

## 2013-01-29 ENCOUNTER — Encounter (HOSPITAL_COMMUNITY): Payer: Self-pay | Admitting: Psychology

## 2013-01-29 NOTE — Progress Notes (Signed)
    Daily Group Progress Note  Program: CD-IOP   Group Time: 1-2:30 pm   Participation Level: Active, Minimizing, Resistant  Behavioral Response: Sharing  Type of Therapy: Process Group  Topic: Group Process: first part of group was spent in process. Members shared about themselves and their concerns and struggles in early recovery.  There was good disclosure and a number of very relevant issues were discussed that almost all the members seemed to be experiencing.   Group Time: 2:45- 4pm  Participation Level: Minimal  Behavioral Response: Sharing  Type of Therapy: Psycho-education Group  Topic: Family Roles in the Addicted Family: The second half of group was spent in a psycho-ed piece with a handout on Family Roles. The presentation included an overview of family roles, but specifically the roles most commonly seen in an addicted family system. Members shared about their early family life and all of the members who grew up in addicted family systems were able to easily identify what roles they had taken on within their family. One member shared that he can already see the roles his young children are taking on within his own family. The children are 61, 23, and 73 years old so these patterns begin very early. The session proved very informative.    Summary: The patient reported he had not attended any meetings since the last group session. He introduced himself and his sobriety date as 3/15. He had admitted drinking last Friday night before the AA meeting. Today, the patient was provided with a drug test collected on the 14th in this group session, which was held before he reported drinking. The drug test was positive for Ambien, which he has agreed to stop taking, Vicodin, which he said he had already stopped taking, and Alcohol. He had not admitted to drinking a day before the Friday relapse. This test identified a significant amount of alcohol had been ingested recently for which he had  not disclosed. This would represent the SECOND relapse he has had since entering the program. He complained and wasn't able to identify when he had last drank previous to the 14th. The patient defended another member's decision to leave the group because of an ill friend in [place]. Later, in the final minutes of the opening session, the patient admitted that he had met all 7 of the criteria for Substance Dependence. In the final half of group, the patient provided good feedback to his fellow group members and thanked one of them for sharing such painful details of his childhood. This patient told his fellow group member, "that it had helped me". The patient is very resistant and difficult at times, but at others, he displays genuine compassion and sensitivity towards his fellow group members. It still seems as if he is not really committed to eliminating his alcohol use, while his family members are more so.    Family Program: Family present? No   Name of family member(s):   UDS collected: No Results: but test collected on Friday the 14th returned positive for alcohol he had not reported previously  AA/NA attended?: No  Sponsor?: No   Aissatou Fronczak, LCAS

## 2013-01-30 ENCOUNTER — Other Ambulatory Visit (HOSPITAL_COMMUNITY): Payer: 59 | Admitting: Psychology

## 2013-01-30 DIAGNOSIS — F1020 Alcohol dependence, uncomplicated: Secondary | ICD-10-CM

## 2013-01-30 LAB — OPIATES/OPIOIDS (LC/MS-MS)
Hydrocodone: 204 ng/mL
Hydromorphone: NEGATIVE ng/mL
Morphine Urine: NEGATIVE ng/mL
Norhydrocodone, Ur: 203 ng/mL
Oxycodone, ur: NEGATIVE ng/mL
Oxymorphone: NEGATIVE ng/mL

## 2013-01-31 ENCOUNTER — Encounter (HOSPITAL_COMMUNITY): Payer: Self-pay | Admitting: Psychology

## 2013-01-31 ENCOUNTER — Other Ambulatory Visit (HOSPITAL_COMMUNITY): Payer: 59

## 2013-01-31 NOTE — Progress Notes (Signed)
    Daily Group Progress Note  Program: CD-IOP   Group Time: 1-2:45  Participation Level: Active  Behavioral Response: Sharing  Type of Therapy: Process Group  Topic: Process: First part of group was Group spent in process. Members shared about the past weekend and the things they did to support their recovery. There was good sharing and feedback among group members. Only one group member did not attend a 12-step meeting.   Group Time: 3-4 pm  Participation Level: None  Behavioral Response: none  Type of Therapy: Activity Group  Topic:"Family Sculpture": The second half of group was spent in an activity known as Contractor. Members were invited to 'sculpt' their families using their fellow group members. It is an intense activity that provides more insight into the member's family dynamics and provides a different perspective to the sculpting member. The activity proved very informative for everyone and in more than one instance, group members gained a new perspective on their own family through the sculpting of other families.     Summary: the patient reported he had attended meetings on Friday and Saturday evenings. He reported he went to his son's home for dinner on Saturday night. His wife was out of town for the weekend. He denied any alcohol use and reported he had halted his use of Ambien and Vicodin. He provided some feedback to a fellow group member. At the break, the patient informed me he would have to leave and pick up his wife at the airport. I allowed him to leave. He assured me he would return on Wednesday and be present for the entire group.    Family Program: Family present? No   Name of family member(s):   UDS collected: No Results:   AA/NA attended?: YesFriday and Saturday  Sponsor?: No   [name], LCAS

## 2013-02-01 ENCOUNTER — Other Ambulatory Visit (HOSPITAL_COMMUNITY): Payer: 59 | Admitting: Psychology

## 2013-02-01 DIAGNOSIS — F1920 Other psychoactive substance dependence, uncomplicated: Secondary | ICD-10-CM

## 2013-02-01 DIAGNOSIS — F1020 Alcohol dependence, uncomplicated: Secondary | ICD-10-CM

## 2013-02-01 LAB — ZOLPIDEM (LC/MS-MS), URINE: Zolpidem (GC/LC/MS), Ur confirm: 38 ng/mL — ABNORMAL HIGH

## 2013-02-02 ENCOUNTER — Other Ambulatory Visit (HOSPITAL_COMMUNITY): Payer: 59

## 2013-02-02 ENCOUNTER — Encounter (HOSPITAL_COMMUNITY): Payer: Self-pay | Admitting: Psychology

## 2013-02-02 LAB — PRESCRIPTION ABUSE MONITORING 17P, URINE
Amphetamine/Meth: NEGATIVE ng/mL
Barbiturate Screen, Urine: NEGATIVE ng/mL
Buprenorphine, Urine: NEGATIVE ng/mL
Cannabinoid Scrn, Ur: NEGATIVE ng/mL
Carisoprodol, Urine: NEGATIVE ng/mL
Cocaine Metabolites: NEGATIVE ng/mL
Fentanyl, Ur: NEGATIVE ng/mL
Tapentadol, urine: NEGATIVE ng/mL
Tramadol Scrn, Ur: NEGATIVE ng/mL

## 2013-02-02 LAB — ALCOHOL METABOLITE (ETG), URINE

## 2013-02-02 NOTE — Progress Notes (Signed)
    Daily Group Progress Note  Program: CD-IOP   Group Time: 1-2:30 pm  Participation Level: Active  Behavioral Response: Sharing and Resistant  Type of Therapy: Process Group  Topic:Group Process: first part of group was spent in process. Members shared about the past two days since the last session and current issues and concerns they are dealing with in early recovery. One member reported she had realized a lot of things after witnessing the sculptures that other members had completed. She is now recognizing how much pain and hurt she has 'stuffed' in years past. This disclosure allowed me to reiterate the importance of digging up and sharing the feelings members have discounted, tried to ignore, or stuffed in their past. Those will remain problematic until they are addressed. The session proved very revealing and members responded positively.   Group Time: 2:45- 4pm  Participation Level: Active  Behavioral Response: Rigid  Type of Therapy: Psycho-education Group  Topic: Emotional Buttons: second half of group was spent in a psycho-ed session. A handout was provided and members took turns reading the different types of 'Emotional Buttons' that people have. Members provided feedback and examples of each one. Some differed about the characteristics provided while others agreed that these descriptions fit them quite accurately. The importance of recognizing what one's buttons are was emphasized and members encouraged to pay more attention to the underlying issues that seem to generate the most powerful responses.    Summary: the patient reported he was doing well. He noted that he had attended the Monday evening AA meeting at his wife's insistence. He had picked her up at the airport on Monday afternoon after the weekend in [place] and he wasn't sure he was going to leave her for the meeting. He reported he is doing fine. He was resistant and opted to challenge the handout and descriptions  provided, but other members explained that the descriptions didn't always include everyone. He also challenged my statement about addiction being genetic and I assured him that researchers had identified specific parts of one's DNA that relate to addiction. This is in keeping with the patient's insistence that this isn't a disease, but rather a choice. Despite his overall cynical and resistant nature, the patient made a few comments of validation and support to his fellow group members. The patient's sobriety date remains 3/15.    Family Program: Family present? No   Name of family member(s):   UDS collected: Yes Results: Presumptive positive - final results not back yet  AA/NA attended?: [facility]  Sponsor?: No, patient has no sponsor and has never used one   Gizell Danser, LCAS

## 2013-02-03 ENCOUNTER — Other Ambulatory Visit (HOSPITAL_COMMUNITY): Payer: 59 | Admitting: Psychology

## 2013-02-03 ENCOUNTER — Other Ambulatory Visit: Payer: Self-pay

## 2013-02-03 DIAGNOSIS — F1020 Alcohol dependence, uncomplicated: Secondary | ICD-10-CM

## 2013-02-03 LAB — OPIATES/OPIOIDS (LC/MS-MS)
Codeine Urine: NEGATIVE ng/mL
Hydrocodone: 245 ng/mL
Hydromorphone: NEGATIVE ng/mL
Morphine Urine: NEGATIVE ng/mL
Oxymorphone: NEGATIVE ng/mL

## 2013-02-03 LAB — ETHYL GLUCURONIDE, URINE: Ethyl Glucuronide (EtG): 4055 ng/mL

## 2013-02-03 MED ORDER — ZOLPIDEM TARTRATE 10 MG PO TABS: 5.0000 mg | ORAL_TABLET | Freq: Every evening | ORAL | Status: DC | PRN

## 2013-02-03 NOTE — Telephone Encounter (Signed)
Zolpidem called to pharamcy

## 2013-02-06 ENCOUNTER — Encounter (HOSPITAL_COMMUNITY): Payer: Self-pay | Admitting: Psychology

## 2013-02-06 ENCOUNTER — Telehealth: Payer: Self-pay

## 2013-02-06 ENCOUNTER — Other Ambulatory Visit (HOSPITAL_COMMUNITY): Payer: 59 | Admitting: Psychology

## 2013-02-06 DIAGNOSIS — F1020 Alcohol dependence, uncomplicated: Secondary | ICD-10-CM

## 2013-02-06 NOTE — Telephone Encounter (Signed)
Zolpidem called to pt's pharmacy 

## 2013-02-06 NOTE — Progress Notes (Signed)
    Daily Group Progress Note  Program: CD-IOP   Group Time: 1-2:30 pm  Participation Level: Active  Behavioral Response: Sharing. resistant  Type of Therapy: Process Group  Topic: Group process; First part of group was spent in process. Members shared about current issues and concerns they are dealing with in early recovery. There were a variety of concerns expressed and good feedback and support provided by group members.  Group Time: 2:45- 4pm  Participation Level: None  Behavioral Response:patient left after the break  Type of Therapy:   Topic:patient left   Summary: The patient reported he had attended a meeting on Wednesday evening. He denied drinking any alcohol and stated his sobriety date remained 3/15. This patient has denied drinking on numerous occasions, but a previous drug test was alcohol-positive and the drug test collected during the last group session two days ago is presumptive positive for alcohol. He appears to be lying to his family and the group and underestimates the seriousness of his illness. Once the drug test results are returned, the patient will meet with this counselor and other treatment options suggested. The patient reported he had to leave at the break because he and his wife were traveling to [facility] for a show with Lenetta Quaker.    Family Program: Family present? No   Name of family member(s):   UDS collected: No Results:  AA/NA attended?: YesWednesday  Sponsor?: No   Nyzier Boivin, LCAS

## 2013-02-07 ENCOUNTER — Other Ambulatory Visit (HOSPITAL_COMMUNITY): Payer: Medicare Other | Attending: Psychiatry

## 2013-02-07 ENCOUNTER — Encounter (HOSPITAL_COMMUNITY): Payer: Self-pay | Admitting: Psychology

## 2013-02-07 DIAGNOSIS — F1010 Alcohol abuse, uncomplicated: Secondary | ICD-10-CM | POA: Insufficient documentation

## 2013-02-07 NOTE — Progress Notes (Signed)
    Daily Group Progress Note  Program: CD-IOP   Group Time: 1-2:30 pm  Participation Level: Minimal  Behavioral Response: Sharing, resistant, evasive  Type of Therapy: Process Group  Topic:  Group Process: First part of group was spent in process. Group members checked in and shared events from the past weekend.  One member admitted that she had relapsed. The events of the evening were reviewed and discussed. The member shared what had occurred and the group described other options that she could have taken. The member who relapsed admitted she had finally accepted that she was an alcoholic and couldn't drink like others. The group provided this young woman with good support and validation for her honesty was acknowledged and the slip was viewed as a learning process for everyone present.  Group Time: 2:45- 4pm  Participation Level: Minimal  Behavioral Response: Resistant and Drowsy  Type of Therapy: Psycho-education Group  Topic: "The Development Process of Recovery"/ Guest Speaker: Second half of group was spent in in a psycho-ed presentation. A handout was provided that identified the process from early recovery up until 18 months. Members shared about the feelings they have experienced in this process. Of course, no one in the group has even 3 months, but plenty identified with the pink cloud and then the sudden return to reality. A guest speaker appeared for the last 45 minutes and shared his story since he first entered treatment for his alcohol dependence. He talked bout the actions he had taken in early recovery, the mistakes he made, and the choices that landed him in ICU in restraints for alcohol detox for the second time. The group was very appreciate and asked good questions of this young man who had recently picked up his 1 year chip.   Summary: The patient reported he had had a good weekend. He reported attending an AA meeting on Saturday evening. He goes quite frequently to  the [employer]. The patient provided consolation to the relapsing member and noted she had a tough time picking between her friends and the alcohol. Some members agreed and I didn't doubt the difficulty and the importance of one's peers, but even the patient admitted the consequences for her with continued alcohol and drug use and far greater than losing those 2 'friends'. He was resistant in talking about sponsors and continues to insist that he was not told he should call his sponsor. This is what [name] has said every session for the last 10 sessions. He seemed sleepy during the guest speaker and said little during that last part of group. The patient continues to deny he has drank despite 3 consecutive positive UA's.   Family Program: Family present? No   Name of family member(s):   UDS collected: No Results:   AA/NA attended?: YesSaturday  Sponsor?: No   [name], LCAS

## 2013-02-08 ENCOUNTER — Other Ambulatory Visit (HOSPITAL_COMMUNITY): Payer: Medicare Other | Admitting: Psychology

## 2013-02-08 DIAGNOSIS — F1020 Alcohol dependence, uncomplicated: Secondary | ICD-10-CM

## 2013-02-09 ENCOUNTER — Other Ambulatory Visit (HOSPITAL_COMMUNITY): Payer: Medicare Other

## 2013-02-10 ENCOUNTER — Encounter (HOSPITAL_COMMUNITY): Payer: Self-pay | Admitting: Psychology

## 2013-02-10 ENCOUNTER — Other Ambulatory Visit (HOSPITAL_COMMUNITY): Payer: Medicare Other

## 2013-02-10 NOTE — Progress Notes (Signed)
    Daily Group Progress Note  Program: CD-IOP   Group Time: 1-2:30 pm  Participation Level: Active  Behavioral Response: Sharing, Resistant, Minimizing  Type of Therapy: Process Group  Topic: Group Process: the first part of group was spent in process. Members shared about their issues and concerns in early recovery. A new group member was present and she introduced herself and talked about her alcoholism and how it has brought her to this program. There was good disclosure and sharing among the group.   Group Time: 2:45- 4pm  Participation Level: Minimal  Behavioral Response: Rigid  Type of Therapy: Psycho-education Group  Topic: Stress Management: a psycho-ed presentation was made on Stress Management. A handout was provided and an explanation of the Stress Response (Fight or Flight) mapped out on the board. The ongoing or chronic stress that many of [place] experience in today's fast paced world was examined. The problems associated with an elevated or more constant stress response detailed. Many of the group members admitted they suffer some of these symptoms. The group took turns reading from the handout and the importance of daily practices that promote balance and good health were emphasized.    Summary:The patient reported he and his wife had attended the [place] show at the [facility] and stated it was good. He reported he was doing well and that he hadn't had anything to drink and his sobriety date remained 3/15. He made a few comments during the psycho-ed on Stress Management, but overall, his observations tended towards being critical or disagreeing with the material presented. The patient continues to deny any alcohol use despite an alcohol positive UA collected on March 26th.    Family Program: Family present? No   Name of family member(s):   UDS collected: No Results:  AA/NA attended?: [place]  Sponsor?: Yes, but he has never called him or met with him   Jaymee Tilson,  LCAS

## 2013-02-13 ENCOUNTER — Other Ambulatory Visit (HOSPITAL_COMMUNITY): Payer: Medicare Other

## 2013-02-14 ENCOUNTER — Other Ambulatory Visit (HOSPITAL_COMMUNITY): Payer: Medicare Other

## 2013-02-15 ENCOUNTER — Other Ambulatory Visit (HOSPITAL_COMMUNITY): Payer: Medicare Other

## 2013-02-16 ENCOUNTER — Other Ambulatory Visit (HOSPITAL_COMMUNITY): Payer: Medicare Other

## 2013-02-17 ENCOUNTER — Other Ambulatory Visit (HOSPITAL_COMMUNITY): Payer: Medicare Other

## 2013-02-20 ENCOUNTER — Other Ambulatory Visit (HOSPITAL_COMMUNITY): Payer: Medicare Other

## 2013-02-21 ENCOUNTER — Other Ambulatory Visit (HOSPITAL_COMMUNITY): Payer: Medicare Other

## 2013-02-22 ENCOUNTER — Other Ambulatory Visit (HOSPITAL_COMMUNITY): Payer: Medicare Other

## 2013-03-01 ENCOUNTER — Other Ambulatory Visit: Payer: Self-pay | Admitting: *Deleted

## 2013-03-01 MED ORDER — DEXLANSOPRAZOLE 60 MG PO CPDR: 60.0000 mg | DELAYED_RELEASE_CAPSULE | Freq: Every day | ORAL | Status: DC

## 2013-04-28 ENCOUNTER — Other Ambulatory Visit: Payer: Self-pay

## 2013-04-28 NOTE — Telephone Encounter (Signed)
Faxed refill for dexilant received pt was advised that he must have an office visit for further refills

## 2013-05-11 ENCOUNTER — Other Ambulatory Visit: Payer: Self-pay | Admitting: *Deleted

## 2013-05-11 MED ORDER — METOPROLOL SUCCINATE ER 100 MG PO TB24: ORAL_TABLET | ORAL | Status: DC

## 2013-05-16 ENCOUNTER — Other Ambulatory Visit: Payer: Self-pay | Admitting: *Deleted

## 2013-05-16 ENCOUNTER — Other Ambulatory Visit: Payer: Self-pay

## 2013-05-16 MED ORDER — METOPROLOL SUCCINATE ER 100 MG PO TB24: ORAL_TABLET | ORAL | Status: DC

## 2013-05-16 MED ORDER — FUROSEMIDE 20 MG PO TABS: 20.0000 mg | ORAL_TABLET | Freq: Every day | ORAL | Status: DC

## 2013-05-26 ENCOUNTER — Other Ambulatory Visit: Payer: Self-pay | Admitting: *Deleted

## 2013-06-12 ENCOUNTER — Other Ambulatory Visit: Payer: Self-pay | Admitting: *Deleted

## 2013-06-12 MED ORDER — METOPROLOL SUCCINATE ER 100 MG PO TB24: ORAL_TABLET | ORAL | Status: DC

## 2013-07-03 ENCOUNTER — Other Ambulatory Visit: Payer: Self-pay

## 2013-07-03 MED ORDER — SPIRONOLACTONE 25 MG PO TABS: 25.0000 mg | ORAL_TABLET | Freq: Every day | ORAL | Status: DC

## 2013-07-05 ENCOUNTER — Other Ambulatory Visit: Payer: Self-pay | Admitting: Internal Medicine

## 2013-07-12 ENCOUNTER — Other Ambulatory Visit: Payer: Self-pay | Admitting: Internal Medicine

## 2013-07-19 ENCOUNTER — Other Ambulatory Visit: Payer: Self-pay | Admitting: Internal Medicine

## 2013-07-19 NOTE — Telephone Encounter (Signed)
Script will be faxed to [facility] at [phone]

## 2013-08-04 ENCOUNTER — Observation Stay (HOSPITAL_COMMUNITY)
Admission: AD | Admit: 2013-08-04 | Discharge: 2013-08-06 | Disposition: A | Discharge status: Home / Self Care | Payer: Medicare Other | Source: Ambulatory Visit | Attending: Internal Medicine | Admitting: Internal Medicine

## 2013-08-04 ENCOUNTER — Ambulatory Visit (INDEPENDENT_AMBULATORY_CARE_PROVIDER_SITE_OTHER): Payer: Medicare Other | Admitting: Internal Medicine

## 2013-08-04 ENCOUNTER — Encounter: Payer: Self-pay | Admitting: Internal Medicine

## 2013-08-04 ENCOUNTER — Other Ambulatory Visit: Payer: Self-pay | Admitting: Internal Medicine

## 2013-08-04 ENCOUNTER — Encounter (HOSPITAL_COMMUNITY)
Admission: AD | Disposition: A | Discharge status: Home / Self Care | Payer: Self-pay | Source: Ambulatory Visit | Attending: Internal Medicine

## 2013-08-04 ENCOUNTER — Observation Stay (HOSPITAL_COMMUNITY): Payer: Medicare Other

## 2013-08-04 ENCOUNTER — Other Ambulatory Visit (INDEPENDENT_AMBULATORY_CARE_PROVIDER_SITE_OTHER): Payer: Medicare Other

## 2013-08-04 ENCOUNTER — Encounter (HOSPITAL_COMMUNITY): Payer: Self-pay | Admitting: Physician Assistant

## 2013-08-04 VITALS — BP 116/62 | HR 92 | Temp 97.0°F | Resp 16 | Wt 161.0 lb

## 2013-08-04 DIAGNOSIS — K921 Melena: Secondary | ICD-10-CM

## 2013-08-04 DIAGNOSIS — K2960 Other gastritis without bleeding: Secondary | ICD-10-CM | POA: Insufficient documentation

## 2013-08-04 DIAGNOSIS — K260 Acute duodenal ulcer with hemorrhage: Secondary | ICD-10-CM

## 2013-08-04 DIAGNOSIS — I8510 Secondary esophageal varices without bleeding: Secondary | ICD-10-CM | POA: Insufficient documentation

## 2013-08-04 DIAGNOSIS — D649 Anemia, unspecified: Secondary | ICD-10-CM | POA: Diagnosis present

## 2013-08-04 DIAGNOSIS — N289 Disorder of kidney and ureter, unspecified: Secondary | ICD-10-CM

## 2013-08-04 DIAGNOSIS — F1021 Alcohol dependence, in remission: Secondary | ICD-10-CM | POA: Insufficient documentation

## 2013-08-04 DIAGNOSIS — K2970 Gastritis, unspecified, without bleeding: Secondary | ICD-10-CM

## 2013-08-04 DIAGNOSIS — D509 Iron deficiency anemia, unspecified: Secondary | ICD-10-CM | POA: Insufficient documentation

## 2013-08-04 DIAGNOSIS — Z79899 Other long term (current) drug therapy: Secondary | ICD-10-CM | POA: Insufficient documentation

## 2013-08-04 DIAGNOSIS — I422 Other hypertrophic cardiomyopathy: Secondary | ICD-10-CM | POA: Insufficient documentation

## 2013-08-04 DIAGNOSIS — K2980 Duodenitis without bleeding: Secondary | ICD-10-CM | POA: Insufficient documentation

## 2013-08-04 DIAGNOSIS — K2920 Alcoholic gastritis without bleeding: Secondary | ICD-10-CM | POA: Insufficient documentation

## 2013-08-04 DIAGNOSIS — K766 Portal hypertension: Secondary | ICD-10-CM | POA: Insufficient documentation

## 2013-08-04 DIAGNOSIS — R0602 Shortness of breath: Secondary | ICD-10-CM | POA: Insufficient documentation

## 2013-08-04 DIAGNOSIS — I10 Essential (primary) hypertension: Secondary | ICD-10-CM | POA: Diagnosis present

## 2013-08-04 DIAGNOSIS — K922 Gastrointestinal hemorrhage, unspecified: Secondary | ICD-10-CM

## 2013-08-04 DIAGNOSIS — K269 Duodenal ulcer, unspecified as acute or chronic, without hemorrhage or perforation: Secondary | ICD-10-CM | POA: Insufficient documentation

## 2013-08-04 DIAGNOSIS — K7030 Alcoholic cirrhosis of liver without ascites: Secondary | ICD-10-CM | POA: Diagnosis present

## 2013-08-04 DIAGNOSIS — I421 Obstructive hypertrophic cardiomyopathy: Secondary | ICD-10-CM | POA: Diagnosis present

## 2013-08-04 HISTORY — DX: Degeneration of nervous system due to alcohol: G31.2

## 2013-08-04 HISTORY — DX: Other ascites: R18.8

## 2013-08-04 HISTORY — PX: ESOPHAGOGASTRODUODENOSCOPY: SHX5428

## 2013-08-04 HISTORY — DX: Alcohol dependence, uncomplicated: F10.20

## 2013-08-04 HISTORY — DX: Polyp of colon: K63.5

## 2013-08-04 HISTORY — DX: Rhabdomyolysis: M62.82

## 2013-08-04 HISTORY — DX: Other diseases of stomach and duodenum: K31.89

## 2013-08-04 HISTORY — DX: Portal hypertension: K76.6

## 2013-08-04 HISTORY — DX: Diverticulosis of large intestine without perforation or abscess without bleeding: K57.30

## 2013-08-04 HISTORY — DX: Fracture of unspecified carpal bone, unspecified wrist, initial encounter for closed fracture: S62.109A

## 2013-08-04 LAB — COMPREHENSIVE METABOLIC PANEL
AST: 21 U/L (ref 0–37)
Albumin: 3.4 g/dL — ABNORMAL LOW (ref 3.5–5.2)
Alkaline Phosphatase: 75 U/L (ref 39–117)
BUN: 79 mg/dL — ABNORMAL HIGH (ref 6–23)
Chloride: 106 mEq/L (ref 96–112)
Creatinine, Ser: 1.77 mg/dL — ABNORMAL HIGH (ref 0.50–1.35)
Potassium: 5.1 mEq/L (ref 3.5–5.1)
Sodium: 140 mEq/L (ref 135–145)
Total Bilirubin: 0.4 mg/dL (ref 0.3–1.2)
Total Protein: 6.7 g/dL (ref 6.0–8.3)

## 2013-08-04 LAB — TYPE AND SCREEN
ABO/RH(D): AB POS
Antibody Screen: NEGATIVE

## 2013-08-04 LAB — APTT: aPTT: 30 seconds (ref 24–37)

## 2013-08-04 LAB — PRO B NATRIURETIC PEPTIDE: Pro B Natriuretic peptide (BNP): 191.7 pg/mL (ref 0–450)

## 2013-08-04 LAB — ABO/RH: ABO/RH(D): AB POS

## 2013-08-04 LAB — HEMOGLOBIN AND HEMATOCRIT, BLOOD: Hemoglobin: 9.6 g/dL — ABNORMAL LOW (ref 13.0–17.0)

## 2013-08-04 LAB — PROTIME-INR
INR: 1.15 (ref 0.00–1.49)
Prothrombin Time: 14.5 seconds (ref 11.6–15.2)

## 2013-08-04 SURGERY — EGD (ESOPHAGOGASTRODUODENOSCOPY)
Anesthesia: Moderate Sedation

## 2013-08-04 MED ORDER — AMLODIPINE BESYLATE 10 MG PO TABS
10.0000 mg | ORAL_TABLET | Freq: Every day | ORAL | Status: DC
  Administered 2013-08-05 – 2013-08-06 (×2): 10 mg via ORAL
  Filled 2013-08-04 (×3): qty 1

## 2013-08-04 MED ORDER — PANTOPRAZOLE SODIUM 40 MG IV SOLR
40.0000 mg | Freq: Two times a day (BID) | INTRAVENOUS | Status: DC
Start: 2013-08-04 — End: 2013-08-05
  Administered 2013-08-04: 40 mg via INTRAVENOUS
  Filled 2013-08-04: qty 40

## 2013-08-04 MED ORDER — SODIUM CHLORIDE 0.9 % IV SOLN
INTRAVENOUS | Status: DC
  Administered 2013-08-04: 12:00:00 via INTRAVENOUS

## 2013-08-04 MED ORDER — HYDROCODONE-ACETAMINOPHEN 5-325 MG PO TABS
0.5000 | ORAL_TABLET | Freq: Two times a day (BID) | ORAL | Status: DC
  Administered 2013-08-04 – 2013-08-05 (×2): 0.5 via ORAL
  Filled 2013-08-04 (×2): qty 1

## 2013-08-04 MED ORDER — HYDROCODONE-ACETAMINOPHEN 2.5-500 MG PO TABS: 1.0000 | ORAL_TABLET | Freq: Two times a day (BID) | ORAL | Status: DC

## 2013-08-04 MED ORDER — MIDAZOLAM HCL 5 MG/ML IJ SOLN
INTRAMUSCULAR | Status: AC
  Filled 2013-08-04: qty 2

## 2013-08-04 MED ORDER — DIPHENHYDRAMINE HCL 50 MG/ML IJ SOLN
INTRAMUSCULAR | Status: AC
  Filled 2013-08-04: qty 1

## 2013-08-04 MED ORDER — HYDROCODONE-ACETAMINOPHEN 5-325 MG PO TABS: 0.5000 | ORAL_TABLET | Freq: Two times a day (BID) | ORAL | Status: DC

## 2013-08-04 MED ORDER — FUROSEMIDE 20 MG PO TABS
20.0000 mg | ORAL_TABLET | Freq: Every day | ORAL | Status: DC
  Administered 2013-08-05 – 2013-08-06 (×2): 20 mg via ORAL
  Filled 2013-08-04 (×2): qty 1

## 2013-08-04 MED ORDER — THIAMINE HCL 100 MG/ML IJ SOLN
100.0000 mg | Freq: Every day | INTRAMUSCULAR | Status: DC
  Administered 2013-08-05 – 2013-08-06 (×2): 100 mg via INTRAVENOUS
  Filled 2013-08-04 (×2): qty 1

## 2013-08-04 MED ORDER — SODIUM CHLORIDE 0.45 % IV SOLN
INTRAVENOUS | Status: DC
  Administered 2013-08-04 – 2013-08-05 (×2): via INTRAVENOUS

## 2013-08-04 MED ORDER — ONDANSETRON HCL 4 MG/2ML IJ SOLN: 4.0000 mg | Freq: Four times a day (QID) | INTRAMUSCULAR | Status: DC | PRN

## 2013-08-04 MED ORDER — ACETAMINOPHEN 325 MG PO TABS
325.0000 mg | ORAL_TABLET | Freq: Two times a day (BID) | ORAL | Status: DC
  Administered 2013-08-04 – 2013-08-06 (×4): 325 mg via ORAL
  Filled 2013-08-04 (×4): qty 1

## 2013-08-04 MED ORDER — ZOLPIDEM TARTRATE 5 MG PO TABS: 5.0000 mg | ORAL_TABLET | Freq: Every evening | ORAL | Status: DC | PRN

## 2013-08-04 MED ORDER — FENTANYL CITRATE 0.05 MG/ML IJ SOLN
INTRAMUSCULAR | Status: AC
  Filled 2013-08-04: qty 4

## 2013-08-04 MED ORDER — SPIRONOLACTONE 25 MG PO TABS
25.0000 mg | ORAL_TABLET | Freq: Every day | ORAL | Status: DC
  Administered 2013-08-05 – 2013-08-06 (×2): 25 mg via ORAL
  Filled 2013-08-04 (×2): qty 1

## 2013-08-04 MED ORDER — BUTAMBEN-TETRACAINE-BENZOCAINE 2-2-14 % EX AERO
INHALATION_SPRAY | CUTANEOUS | Status: DC | PRN
  Administered 2013-08-04: 2 via TOPICAL

## 2013-08-04 MED ORDER — MIDAZOLAM HCL 10 MG/2ML IJ SOLN
INTRAMUSCULAR | Status: DC | PRN
  Administered 2013-08-04 (×2): 2 mg via INTRAVENOUS

## 2013-08-04 MED ORDER — PNEUMOCOCCAL VAC POLYVALENT 25 MCG/0.5ML IJ INJ
0.5000 mL | INJECTION | INTRAMUSCULAR | Status: AC
  Filled 2013-08-04: qty 0.5

## 2013-08-04 MED ORDER — ONDANSETRON HCL 4 MG PO TABS: 4.0000 mg | ORAL_TABLET | Freq: Four times a day (QID) | ORAL | Status: DC | PRN

## 2013-08-04 MED ORDER — SODIUM CHLORIDE 0.9 % IV SOLN: INTRAVENOUS | Status: DC

## 2013-08-04 MED ORDER — METOPROLOL SUCCINATE ER 100 MG PO TB24
100.0000 mg | ORAL_TABLET | Freq: Every day | ORAL | Status: DC
  Administered 2013-08-05 – 2013-08-06 (×2): 100 mg via ORAL
  Filled 2013-08-04 (×2): qty 1

## 2013-08-04 MED ORDER — FENTANYL CITRATE 0.05 MG/ML IJ SOLN
INTRAMUSCULAR | Status: DC | PRN
  Administered 2013-08-04 (×2): 25 ug via INTRAVENOUS

## 2013-08-04 MED ORDER — PANTOPRAZOLE SODIUM 40 MG PO TBEC
40.0000 mg | DELAYED_RELEASE_TABLET | Freq: Two times a day (BID) | ORAL | Status: DC
  Administered 2013-08-05 – 2013-08-06 (×3): 40 mg via ORAL
  Filled 2013-08-04 (×3): qty 1

## 2013-08-04 NOTE — H&P (Signed)
[name] is an 79 y.o. male.   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 He denies chest pain but has had marked increase in SOB/DOE over the past several days. His appetite has been poor.  [name] has a h/o cirrhosis. Last OV Dr. Jarold Motto July '12 when he presented with melena. Per that note last EGD Jan '06 - revealed portal gastropathy. At the July '12 visit he refused EGD for evaluation and follow up of gastropathy or varices.  He has a h/o CHF and on exam does have rales, worse at the left base. Last 2D echo 03/03/11 with EF 55-65%, grade 1 diastolic dysfunction  On exam he is pale, noticeably short of breath and had flash positive dark stool on rectal exam.   He is now admitted with probable UGI bleed and possible CHF.   Past Medical History  Diagnosis Date  . Hypertrophic cardiomyopathy     a.  echo 8/10: EF 55-65%, basal septal hypertrophy and small LVOT but no sig. gradient and no SAM, mod LVH, mild AI, mild LAE (IVS ED 13.6 mm);    b.  echo 4/12: EF  55-65%, mild LVH, Gr 1 diast dysfxn, very mild AS, mod AI,   . HTN (hypertension)   . HLD (hyperlipidemia)   . Alcoholic cirrhosis   . GERD (gastroesophageal reflux disease)   . Renal insufficiency   . Alcoholism     Past Surgical History  Procedure Laterality Date  . Inguinal hernia repair    . Melanoma excision      Family History  Problem Relation Age of Onset  . Colon cancer Father   . Heart disease Mother    Social History:  reports that he has never smoked. He has never used smokeless tobacco. He reports that he does not drink alcohol or use illicit drugs, but has a h/o heavy alcohol use.  Retired Sports administrator. Lives with his wife. 4 children. Remains independent. ACP- not discussed, will approach at next OV    Allergies: No Known Allergies  No prescriptions prior to admission    Results for orders  placed in visit on 08/04/13 (from the past 48 hour(s))  HEMOGLOBIN AND HEMATOCRIT, BLOOD     Status: Abnormal   Collection Time    08/04/13 10:24 AM      Result Value Range   Hemoglobin 9.6 (*) 13.0 - 17.0 g/dL   HCT 16.1 (*) 09.6 - 04.5 %   No results found.  Review of Systems  Constitutional: Negative for fever, chills and weight loss.  HENT: Positive for nosebleeds. Negative for hearing loss and congestion.   Eyes: Negative.   Respiratory: Positive for shortness of breath. Negative for hemoptysis and wheezing.   Cardiovascular: Negative for chest pain, orthopnea and leg swelling.  Gastrointestinal: Positive for melena. Negative for heartburn, nausea, vomiting, abdominal pain and diarrhea.  Genitourinary: Negative.   Musculoskeletal: Positive for back pain. Negative for myalgias and falls.  Skin: Negative.   Neurological: Positive for weakness. Negative for dizziness, tremors, focal weakness and headaches.  Endo/Heme/Allergies: Bruises/bleeds easily.  Psychiatric/Behavioral: Positive for substance abuse. Negative for depression. The patient has insomnia.      Physical Exam  T 97 BP  116/62  HR 92  R - 14  O2 sat 95%RA Gen'l - thin, pale white man in no acute distress HEENT - Harvel, bandaid left frontal scalp, EAC's w/ cerumen  obscurring TMs. Oropharynx normal. Neck - supple, no thyromegaly Nodes - negative cervical region Cor - 1+ left radial pulse - thready. No JVD, no carotid bruits. Quiet precordium. RRR, II/VI systolic mm RSB, II/VI mm apex. Pulm  - no increased WOB, rales at the left base, no wheezing Abd - BS+ no guarding or rebound, no obvious HSM Rectal - NST, generous prostate w/o nodules, Stool dark and heme positive Ext - no deformity, interosseous wasting both hands, trace edema at ankles Neuro - A&O x 3, Speech clear, cognition normal, CN II-XII normal, gait - ambulates independently with a cane. Derm - no obvious lesions neck, head, arms. Assessment/Plan 1. GI -  patient with painless melena - slow variceal bleed vs gastropathy vs painless ulcer.  Plan Med-surg admit  Start IV protonix  Type and hold  NS at 10-20 cc/hr  GI consult - spoke with Ms. Clarita Leber  2. Cardiac - h/o diastolic dysfunction, hypertrophic cardiomyopathy and pul edema in the past. Last seen by cardiology March 26, '13. He is now presenting w/o SOB most likely related to #1 but with rales on exam. No history or symptoms to suggest acute event.  Plan 12 Lead EKG  Continue home meds  BNP  PA-Lat CXR  Illene Regulus 08/04/2013, 11:26 AM

## 2013-08-04 NOTE — Op Note (Signed)
[facility] [address] [place], [address]   ENDOSCOPY PROCEDURE REPORT PATIENT: [name], [name]  MR#: [id number] BIRTHDATE: 01/14/1929 , 84  yrs. old GENDER: Male ENDOSCOPIST: Meryl Dare, MD, [facility] REFERRED BY:  Jacques Navy, M.D. PROCEDURE DATE:  08/04/2013 PROCEDURE:  EGD w/ biopsy ASA CLASS:     Class III INDICATIONS:  Melena.   Cirrhosis. MEDICATIONS: medications were titrated to patient response per physician's verbal order, Fentanyl 50 mcg IV, and Versed 4 mg IV TOPICAL ANESTHETIC: Cetacaine Spray DESCRIPTION OF PROCEDURE: After the risks benefits and alternatives of the procedure were thoroughly explained, informed consent was obtained.  The PENTAX GASTOROSCOPE [id number] endoscope was introduced through the mouth and advanced to the second portion of the duodenum without limitations.  The instrument was slowly withdrawn as the mucosa was fully examined.  ESOPHAGUS: There were 2 columns of small varices in the distal third of the esophagus.   The esophagus was otherwise normal. STOMACH: Moderate erosive gastritis was found in the gastric antrum. Multiple biopsies were performed.  Mild gastritis was found in the cardia, gastric fundus, and gastric body. The stomach otherwise appeared normal. DUODENUM: A single non-bleeding round ulcer, measuring 5 x 6 mm in size, with a pigmented spot was found in the duodenal bulb.   A single non-bleeding round ulcer, measuring 7 x 8 mm in size, with a pigmented spot was found in the 2nd part of the duodenum. Moderate duodenal inflammation was found in the 2nd part of the duodenum. Retroflexed views revealed no abnormalities except gastritis previously described.  The scope was then withdrawn from the patient and the procedure completed.  COMPLICATIONS: There were no complications.  ENDOSCOPIC IMPRESSION: 1.   2 columns of small varices in the distal third of the esophagus  2.   Erosive  gastritis in the gastric antrum; multiple biopsies 4.   Gastritis in the cardia, gastric fundus, and gastric body 6.   Single non-bleeding ulcer in the duodenal bulb 7.   Single non-bleeding ulcer in the 2nd part of the duodenum 8.   Erosive duodenitis in the 2nd part of the duodenum  RECOMMENDATIONS: 1.  Avoid NSAIDS long term 2.  Continue PPI BID for 4 weeks then qam long term 3.  Await pathology results  eSigned:  Meryl Dare, MD, [facility] 08/04/2013 2:34 PM

## 2013-08-04 NOTE — H&P (View-Only) (Signed)
[facility] Consult: 12:41 PM 08/04/2013   Referring Provider: Dr Norins Primary Care Physician:  Michael Norins, MD Primary Gastroenterologist:  Dr. David Patterson.  Last seen 06/2011.    Reason for Consultation:  Melenic stool and acute anemia.   HPI: [name] is a 79 y.o. male.  Pt is retired Pathologist.  Hx htn, hypertrophic cardiomyopathy. Hx malignant melanoma.  Chronic iron deficiency and anemia. 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   Latest EGD was 11/2004 showing mild portal gastropathy, no varices.  Last colonoscopy was "several years ago" per note of 2006. Note mentions hx of colon polyps (polyp type not mentioned) and diverticulosis.  Though familiy hx + for colon cancer in the father, pt has refused subsequent colonoscopic screening. Had bouts of diarrhea in late 2005 that resolved with Imodium.  Had paracentesis in 11/2004 this showed "early SBP" per Dr Robinson of infectious disease.    Melena in 05/2011 resolved with PPI and Carafate. He refused EGD then. Plan was for q 3 month GI follow up, but he has not been seen by Dr Patterson since 06/2011.   Seen by Dr Norins re DOE in office today. Pt c/o black, tarry stools about 1 to 2 per day for 7 to 10 days  No n/v or abdominal pain. Pt pale, dyspneic with rales and FOB + stool in the office. Hgb measured 9.6.  Latest Hgb of 12/2012 was 13.6.  Dr Norins sent him for direct admission to [facility].  Came in to PMD because of dyspnea with minor exertion, not because of the black stools.   Pt says he has not consumed vodka for about 2 to 3 months.  Abstinence triggered by family decision to place breathalyzer governor on the pt's automobile.  Until then pt admits to 3 to 4 vodka drinks per day.  Family believes pt has been abstinent.    No falls or dizziness.  No nose bleeds or unusual blood clotting.   No headaches, no falls.   Takes 600 to 800 mg Ibuprofen  per day for back and joint pain. No jaundice, no tea colored urine.  With meds he sleeps well.  Note pt assessed by Behavioral health intensive chemical dependency program in 01/2013.  Previous treatment at [facility] ~ 2009.  Pt attends AA meetings.     Past Medical History  Diagnosis Date  . Hypertrophic cardiomyopathy     a.  echo 8/10: EF 55-65%, basal septal hypertrophy and small LVOT but no sig. gradient and no SAM, mod LVH, mild AI, mild LAE (IVS ED 13.6 mm);    b.  echo 4/12: EF  55-65%, mild LVH, Gr 1 diast dysfxn, very mild AS, mod AI,   . HTN (hypertension)   . HLD (hyperlipidemia)   . Alcoholic cirrhosis   . GERD (gastroesophageal reflux disease)   . Renal insufficiency   . Alcoholism   . 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 Rhabdomyolysis 01/2005  . Colon polyp perhaps in 1990s    pathology/type not known  . Diverticulosis of colon perhaps in 1990s  . Ascites 11/2004    "early" SBP on paracentesis.   . Portal hypertensive gastropathy 2006    Past Surgical History  Procedure Laterality Date  . Inguinal hernia repair    . Melanoma excision Left 02/2011    malignant melanoma of thigh  . Basal cell carcinoma excision Left 11/2012    lower eyelid    Prior to Admission medications     Medication Sig Start Date End Date Taking? Authorizing Provider  amLODipine (NORVASC) 10 MG tablet Take 1 tablet (10 mg total) by mouth daily. 01/24/13   Thomas D Stuckey, MD  aspirin 325 MG EC tablet Take 162.5 mg by mouth daily.      Historical Provider, MD  dexlansoprazole (DEXILANT) 60 MG capsule Take 1 capsule (60 mg total) by mouth daily. 03/01/13 08/21/14  David R Patterson, MD  furosemide (LASIX) 20 MG tablet Take 1 tablet (20 mg total) by mouth daily. 05/16/13   Michael E Norins, MD  HYDROcodone-acetaminophen (VICODIN) 2.5-500 MG per tablet Take 1 tablet by mouth 2 (two) times daily.    Historical Provider, MD  metoprolol succinate (TOPROL-XL) 100 MG 24 hr tablet TAKE ONE AND ONE-HALF  TABLETS DAILY -TAKE WITH OR IMMEDIATELY FOLLOWING A MEAL 07/12/13   Michael E Norins, MD  Multiple Vitamin (MULTIVITAMIN WITH MINERALS) TABS Take 1 tablet by mouth daily.    Historical Provider, MD  simvastatin (ZOCOR) 20 MG tablet TAKE ONE TABLET AT BEDTIME 07/05/13   Michael E Norins, MD  spironolactone (ALDACTONE) 25 MG tablet Take 1 tablet (25 mg total) by mouth daily. 07/03/13   Michael E Norins, MD  zolpidem (AMBIEN) 10 MG tablet TAKE 1/2 TABLET (5MG TOTAL) BY MOUTH AT BEDTIME AS NEEDED FOR SLEEP- MAXIMUM DOSAGE IS 10MG DAILY 07/19/13   Michael E Norins, MD  Ibuprofen  200 mg                   3 to 4 per day.    Scheduled Meds:  Infusions:  PRN Meds: ondansetron (ZOFRAN) IV, ondansetron, zolpidem   Allergies as of 08/04/2013  . (No Known Allergies)    Family History  Problem Relation Age of Onset  . Colon cancer Father   . Heart disease Mother     History   Social History  . Marital Status: Married    Spouse Name: N/A    Number of Children: 4  . Years of Education: 24   Occupational History  . pathologist     retired   Social History Main Topics  . Smoking status: Never Smoker   . Smokeless tobacco: Never Used  . Alcohol Use: No     Comment: member of AA  . Drug Use: No  . Sexual Activity: Not on file   Other Topics Concern  . Not on file   Social History Narrative   Retired pathologist. Lives with his wife. 4 children.  Remains independent. ACP- not discussed, will approach at next OV    REVIEW OF SYSTEMS: Per HPI   PHYSICAL EXAM: Vital signs in last 24 hours: Temp:  [97 F (36.1 C)-98.2 F (36.8 C)] 98.2 F (36.8 C) (09/26 1155) Pulse Rate:  [87-92] 87 (09/26 1155) Resp:  [16-20] 20 (09/26 1155) BP: (107-116)/(54-62) 107/54 mmHg (09/26 1155) SpO2:  [95 %-100 %] 100 % (09/26 1155) Weight:  [71.623 kg (157 lb 14.4 oz)-73.029 kg (161 lb)] 71.623 kg (157 lb 14.4 oz) (09/26 1155)  General:  Somewhat frail appearing elderly WM in NAD Head:  No signs  of trauma.  No asymmetry or swelling  Eyes:  No icterus, slight conj pallor Ears:  No obvious hearing loss  Nose:  No discharge Mouth:  Moist, clear oral mm.  Good dental repair Neck:  No mass or JVD Lungs:  Rales at left base.  No cough.  No resting dyspnea Heart: RRR.  Soft 1/6 sysolic murmer.  Abdomen:  Soft, no   obvious ascites.  Not distended or tender.  No mass or HSM.  No bruits.   Rectal: done by Dr Norins:  Dark and FOB +   Musc/Skeltl: no joint contractures.  + kyphosis. No palmar tendon contractions Extremities:  No pedal edema.  No palmar erythema.    Neurologic:  Oriented x 3.  No tremor or asterixis.  Skin:  No telangectasia or large bruises Tattoos:  none Nodes:  No cervical adenopathy.    Psych:  Cooperative, relaxed, alert.   Intake/Output from previous day:   Intake/Output this shift:    LAB RESULTS:  Recent Labs  08/04/13 1024  HGB 9.6*  HCT 28.1*   BMET Lab Results  Component Value Date   NA 137 12/19/2012   NA 135 02/02/2012   NA 138 08/03/2011   K 4.5 12/19/2012   K 4.9 02/02/2012   K 3.8 08/03/2011   CL 104 12/19/2012   CL 102 02/02/2012   CL 106 08/03/2011   CO2 20 12/19/2012   CO2 27 02/02/2012   CO2 19 08/03/2011   GLUCOSE 107* 12/19/2012   GLUCOSE 115* 02/02/2012   GLUCOSE 100* 08/03/2011   BUN 27* 12/19/2012   BUN 23 02/02/2012   BUN 28* 08/03/2011   CREATININE 1.26 12/19/2012   CREATININE 1.2 02/02/2012   CREATININE 1.5 08/03/2011   CALCIUM 9.2 12/19/2012   CALCIUM 9.0 02/02/2012   CALCIUM 8.3* 08/03/2011   LFT No results found for this basename: PROT, ALBUMIN, AST, ALT, ALKPHOS, BILITOT, BILIDIR, IBILI,  in the last 72 hours PT/INR Lab Results  Component Value Date   INR 1.03 12/19/2012   INR 1.00 03/05/2011   INR 1.1 RATIO* 09/20/2007    ENDOSCOPIC STUDIES: Per HPI  IMPRESSION: *  GI bleed in known cirrhotic with portal hypertension on last EGD in 2006.  Rule out variceal bleed, rule out portal gastropathy bleed, rule out ulcer disease  *   Anemia.  4 gram drop of Hgb in 7 month*  Hx SBP 2006  *  Alcoholism. Actively consuming vodka until about 2 to 3 months ago.  *  Remote hx colon polyps of unknown type, + paternal hx of colon cancer. Pt previously refused any follow up colonoscopies.   PLAN: *  EGD 2 pm today *  Pending labs include PT/INR, BNP, type and screen.     LOS: 0 days   Sarah Gribbin  08/04/2013, 12:41 PM Pager: [phone]      Attending physician's note   I have taken a history, examined the patient and reviewed the chart. I agree with the Advanced Practitioner's note, impression and recommendations. Melena, DOE and anemia in setting of Laennec's cirrhosis, portal hypertension. R/O gastropathy, ulcer, neoplasm, varices.  IV PPI and IV octreotide for now. EGD today.   Manuelito Poage T Samual Beals, MD FACG 

## 2013-08-04 NOTE — Interval H&P Note (Signed)
History and Physical Interval Note:  08/04/2013 1:30 PM  [name]  has presented today for surgery, with the diagnosis of gi bleed  The various methods of treatment have been discussed with the patient and family. After consideration of risks, benefits and other options for treatment, the patient has consented to  Procedure(s): ESOPHAGOGASTRODUODENOSCOPY (EGD) (N/A) as a surgical intervention .  The patient's history has been reviewed, patient examined, no change in status, stable for surgery.  I have reviewed the patient's chart and labs.  Questions were answered to the patient's satisfaction.     Venita Lick. Russella Dar MD Clementeen Graham

## 2013-08-04 NOTE — Progress Notes (Signed)
  Subjective:    Patient ID: [name], male    DOB: 15-Oct-1929, 79 y.o.   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 He has not had any pain but he has some heaviness in the chest and has started to feel a little light-headed. No N/V, no diarrhea. He has poor PO intake.  Stat Hgb 9.6 grams.   For admission - active UGI bleed.  Review of Systems     Objective:   Physical Exam        Assessment & Plan:

## 2013-08-04 NOTE — Consult Note (Signed)
[facility] Consult: 12:41 PM 08/04/2013   Referring Provider: Dr Debby Bud Primary Care Physician:  Illene Regulus, MD Primary Gastroenterologist:  Dr. Sheryn Bison.  Last seen 06/2011.    Reason for Consultation:  Melenic stool and acute anemia.   HPI: [name] is a 79 y.o. male.  Pt is retired Sports administrator.  Hx htn, hypertrophic cardiomyopathy. Hx malignant melanoma.  Chronic iron deficiency and anemia. 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   Latest EGD was 11/2004 showing mild portal gastropathy, no varices.  Last colonoscopy was "several years ago" per note of 2006. Note mentions hx of colon polyps (polyp type not mentioned) and diverticulosis.  Though familiy hx + for colon cancer in the father, pt has refused subsequent colonoscopic screening. Had bouts of diarrhea in late 2005 that resolved with Imodium.  Had paracentesis in 11/2004 this showed "early SBP" per Dr Roxan Hockey of infectious disease.    Melena in 05/2011 resolved with PPI and Carafate. He refused EGD then. Plan was for q 3 month GI follow up, but he has not been seen by Dr Jarold Motto since 06/2011.   Seen by Dr Debby Bud re DOE in office today. Pt c/o black, tarry stools about 1 to 2 per day for 7 to 10 days  No n/v or abdominal pain. Pt pale, dyspneic with rales and FOB + stool in the office. Hgb measured 9.6.  Latest Hgb of 12/2012 was 13.6.  Dr Debby Bud sent him for direct admission to [facility].  Came in to PMD because of dyspnea with minor exertion, not because of the black stools.   Pt says he has not consumed vodka for about 2 to 3 months.  Abstinence triggered by family decision to place breathalyzer governor on the pt's automobile.  Until then pt admits to 3 to 4 vodka drinks per day.  Family believes pt has been abstinent.    No falls or dizziness.  No nose bleeds or unusual blood clotting.   No headaches, no falls.   Takes 600 to 800 mg Ibuprofen  per day for back and joint pain. No jaundice, no tea colored urine.  With meds he sleeps well.  Note pt assessed by Behavioral health intensive chemical dependency program in 01/2013.  Previous treatment at [facility] ~ 2009.  Pt attends AA meetings.     Past Medical History  Diagnosis Date  . Hypertrophic cardiomyopathy     a.  echo 8/10: EF 55-65%, basal septal hypertrophy and small LVOT but no sig. gradient and no SAM, mod LVH, mild AI, mild LAE (IVS ED 13.6 mm);    b.  echo 4/12: EF  55-65%, mild LVH, Gr 1 diast dysfxn, very mild AS, mod AI,   . HTN (hypertension)   . HLD (hyperlipidemia)   . Alcoholic cirrhosis   . GERD (gastroesophageal reflux disease)   . Renal insufficiency   . Alcoholism   . 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 Rhabdomyolysis 01/2005  . Colon polyp perhaps in 1990s    pathology/type not known  . Diverticulosis of colon perhaps in 1990s  . Ascites 11/2004    "early" SBP on paracentesis.   . Portal hypertensive gastropathy 2006    Past Surgical History  Procedure Laterality Date  . Inguinal hernia repair    . Melanoma excision Left 02/2011    malignant melanoma of thigh  . Basal cell carcinoma excision Left 11/2012    lower eyelid    Prior to Admission medications  Medication Sig Start Date End Date Taking? Authorizing Provider  amLODipine (NORVASC) 10 MG tablet Take 1 tablet (10 mg total) by mouth daily. 01/24/13   Herby Abraham, MD  aspirin 325 MG EC tablet Take 162.5 mg by mouth daily.      Historical Provider, MD  dexlansoprazole (DEXILANT) 60 MG capsule Take 1 capsule (60 mg total) by mouth daily. 03/01/13 08/21/14  Mardella Layman, MD  furosemide (LASIX) 20 MG tablet Take 1 tablet (20 mg total) by mouth daily. 05/16/13   Jacques Navy, MD  HYDROcodone-acetaminophen (VICODIN) 2.5-500 MG per tablet Take 1 tablet by mouth 2 (two) times daily.    Historical Provider, MD  metoprolol succinate (TOPROL-XL) 100 MG 24 hr tablet TAKE ONE AND ONE-HALF  TABLETS DAILY -TAKE WITH OR IMMEDIATELY FOLLOWING A MEAL 07/12/13   Jacques Navy, MD  Multiple Vitamin (MULTIVITAMIN WITH MINERALS) TABS Take 1 tablet by mouth daily.    Historical Provider, MD  simvastatin (ZOCOR) 20 MG tablet TAKE ONE TABLET AT BEDTIME 07/05/13   Jacques Navy, MD  spironolactone (ALDACTONE) 25 MG tablet Take 1 tablet (25 mg total) by mouth daily. 07/03/13   Jacques Navy, MD  zolpidem (AMBIEN) 10 MG tablet TAKE 1/2 TABLET (5MG  TOTAL) BY MOUTH AT BEDTIME AS NEEDED FOR SLEEP- MAXIMUM DOSAGE IS 10MG  DAILY 07/19/13   Jacques Navy, MD  Ibuprofen  200 mg                   3 to 4 per day.    Scheduled Meds:  Infusions:  PRN Meds: ondansetron (ZOFRAN) IV, ondansetron, zolpidem   Allergies as of 08/04/2013  . (No Known Allergies)    Family History  Problem Relation Age of Onset  . Colon cancer Father   . Heart disease Mother     History   Social History  . Marital Status: Married    Spouse Name: N/A    Number of Children: 4  . Years of Education: 24   Occupational History  . pathologist     retired   Social History Main Topics  . Smoking status: Never Smoker   . Smokeless tobacco: Never Used  . Alcohol Use: No     Comment: member of AA  . Drug Use: No  . Sexual Activity: Not on file   Other Topics Concern  . 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  Remains independent. ACP- not discussed, will approach at next OV    REVIEW OF SYSTEMS: Per HPI   PHYSICAL EXAM: Vital signs in last 24 hours: Temp:  [97 F (36.1 C)-98.2 F (36.8 C)] 98.2 F (36.8 C) (09/26 1155) Pulse Rate:  [87-92] 87 (09/26 1155) Resp:  [16-20] 20 (09/26 1155) BP: (107-116)/(54-62) 107/54 mmHg (09/26 1155) SpO2:  [95 %-100 %] 100 % (09/26 1155) Weight:  [71.623 kg (157 lb 14.4 oz)-73.029 kg (161 lb)] 71.623 kg (157 lb 14.4 oz) (09/26 1155)  General:  Somewhat frail appearing elderly WM in NAD Head:  No signs  of trauma.  No asymmetry or swelling  Eyes:  No icterus, slight conj pallor Ears:  No obvious hearing loss  Nose:  No discharge Mouth:  Moist, clear oral mm.  Good dental repair Neck:  No mass or JVD Lungs:  Rales at left base.  No cough.  No resting dyspnea Heart: RRR.  Soft 1/6 sysolic murmer.  Abdomen:  Soft, no  obvious ascites.  Not distended or tender.  No mass or HSM.  No bruits.   Rectal: done by Dr Debby Bud:  Dark and FOB +   Musc/Skeltl: no joint contractures.  + kyphosis. No palmar tendon contractions Extremities:  No pedal edema.  No palmar erythema.    Neurologic:  Oriented x 3.  No tremor or asterixis.  Skin:  No telangectasia or large bruises Tattoos:  none Nodes:  No cervical adenopathy.    Psych:  Cooperative, relaxed, alert.   Intake/Output from previous day:   Intake/Output this shift:    LAB RESULTS:  Recent Labs  08/04/13 1024  HGB 9.6*  HCT 28.1*   BMET Lab Results  Component Value Date   NA 137 12/19/2012   NA 135 02/02/2012   NA 138 08/03/2011   K 4.5 12/19/2012   K 4.9 02/02/2012   K 3.8 08/03/2011   CL 104 12/19/2012   CL 102 02/02/2012   CL 106 08/03/2011   CO2 20 12/19/2012   CO2 27 02/02/2012   CO2 19 08/03/2011   GLUCOSE 107* 12/19/2012   GLUCOSE 115* 02/02/2012   GLUCOSE 100* 08/03/2011   BUN 27* 12/19/2012   BUN 23 02/02/2012   BUN 28* 08/03/2011   CREATININE 1.26 12/19/2012   CREATININE 1.2 02/02/2012   CREATININE 1.5 08/03/2011   CALCIUM 9.2 12/19/2012   CALCIUM 9.0 02/02/2012   CALCIUM 8.3* 08/03/2011   LFT No results found for this basename: PROT, ALBUMIN, AST, ALT, ALKPHOS, BILITOT, BILIDIR, IBILI,  in the last 72 hours PT/INR Lab Results  Component Value Date   INR 1.03 12/19/2012   INR 1.00 03/05/2011   INR 1.1 RATIO* 09/20/2007    ENDOSCOPIC STUDIES: Per HPI  IMPRESSION: *  GI bleed in known cirrhotic with portal hypertension on last EGD in 2006.  Rule out variceal bleed, rule out portal gastropathy bleed, rule out ulcer disease  *   Anemia.  4 gram drop of Hgb in 7 month*  Hx SBP 2006  *  Alcoholism. Actively consuming vodka until about 2 to 3 months ago.  *  Remote hx colon polyps of unknown type, + paternal hx of colon cancer. Pt previously refused any follow up colonoscopies.   PLAN: *  EGD 2 pm today *  Pending labs include PT/INR, BNP, type and screen.     LOS: 0 days   Jennye Moccasin  08/04/2013, 12:41 PM Pager: [phone]      Attending physician's note   I have taken a history, examined the patient and reviewed the chart. I agree with the Advanced Practitioner's note, impression and recommendations. Melena, DOE and anemia in setting of Laennec's cirrhosis, portal hypertension. R/O gastropathy, ulcer, neoplasm, varices.  IV PPI and IV octreotide for now. EGD today.   Meryl Dare, MD [facility]

## 2013-08-05 LAB — CBC
HCT: 23.2 % — ABNORMAL LOW (ref 39.0–52.0)
MCH: 32.4 pg (ref 26.0–34.0)
MCHC: 34.9 g/dL (ref 30.0–36.0)
Platelets: 155 10*3/uL (ref 150–400)
RBC: 2.5 MIL/uL — ABNORMAL LOW (ref 4.22–5.81)
RDW: 12.8 % (ref 11.5–15.5)
WBC: 10 10*3/uL (ref 4.0–10.5)

## 2013-08-05 NOTE — Progress Notes (Signed)
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 He has been up to the bathroom and reports he is less SOB. He reports that his stools are less dark and tarry.  Objective: Lab:  Recent Labs  08/04/13 1024 08/05/13 0425  WBC  --  10.0  HGB 9.6* 8.1*  HCT 28.1* 23.2*  MCV  --  92.8  PLT  --  155    Recent Labs  08/04/13 1600  NA 140  K 5.1  CL 106  GLUCOSE 137*  BUN 79*  CREATININE 1.77*  CALCIUM 9.3   pBNP 191.7  Imaging: CXR 08/04/13: FINDINGS:  Chronic elevation of right hemidiaphragm is again seen. Both lungs  are clear. No evidence of pleural effusion. Heart size is stable and  within normal limits allowing for low lung volumes. No definite mass  or lymphadenopathy identified.  IMPRESSION:  Chronic elevation right hemidiaphragm. No active disease.  Scheduled Meds: . HYDROcodone-acetaminophen  0.5 tablet Oral BID   And  . acetaminophen  325 mg Oral BID  . amLODipine  10 mg Oral Daily  . furosemide  20 mg Oral Daily  . metoprolol succinate  100 mg Oral Daily  . pantoprazole  40 mg Oral BID  . pantoprazole (PROTONIX) IV  40 mg Intravenous Q12H  . pneumococcal 23 valent vaccine  0.5 mL Intramuscular Tomorrow-1000  . spironolactone  25 mg Oral Daily  . thiamine  100 mg Intravenous Daily   Continuous Infusions: . sodium chloride 75 mL/hr at 08/05/13 0609  . sodium chloride 20 mL/hr at 08/04/13 1228   PRN Meds:.ondansetron (ZOFRAN) IV, ondansetron, zolpidem   Physical Exam: Filed Vitals:   08/05/13 0609  BP: 119/54  Pulse: 66  Temp: 97.6 F (36.4 C)  Resp: 20   gen'l - WNWD man reading his paper, in no distress Cor - RRR Pulm - normal respirations Abd - BS+, soft, no guarding or rebound Neuro - A&O x3      Assessment/Plan: 1. GI - EGD revealed several duodenal ulcers, gastritis and stable varices. No clear evidence of active bleed but Hgb dropped 1.5 g overnight. This may be volume related with IVF but  can be related to bleed.  Reviewed with [name] the findings. Also reviewed chart in regard to diagnosis of cirrhosis: CT in '06 with liver appearance c/w cirrhosis, Abd U/S with echo texture c/w cirrhosis and Dr. Norval Gable last note in July '12 where he diagnosed Child's A cirrhosis.    Plan Continued observation  F/u H/H in AM  Advance diet.  2. Cardiac - stable with normal BNP. No chest pain.  3. Renal insufficiency - Admission Creatinine 1.77 up from baseline of 1.26. He was hydrated overnight  Plan D/c IVF after current fluids complete  Bmet in AM   [employer] [facility] (o) [phone]; (c) [phone] Call-grp - [facility]  Tele: [phone]  08/05/2013, 9:02 AM

## 2013-08-05 NOTE — Progress Notes (Signed)
Cross cover for [facility] Subjective: Patient is having his dinner; denies having any active GI issues. Is scheduled to go home tomorrow. Denies having any abdominal pain, nausea or vomiting.   Objective: Vital signs in last 24 hours: Temp:  [97.6 F (36.4 C)-97.8 F (36.6 C)] 97.7 F (36.5 C) (09/27 1403) Pulse Rate:  [66-77] 77 (09/27 1403) Resp:  [18-20] 18 (09/27 1403) BP: (118-123)/(54-59) 123/59 mmHg (09/27 1403) SpO2:  [99 %-100 %] 100 % (09/27 1403) Weight:  [72.893 kg (160 lb 11.2 oz)] 72.893 kg (160 lb 11.2 oz) (09/27 0609) Last BM Date: 08/05/13  Intake/Output from previous day: 09/26 0701 - 09/27 0700 In: 240 [P.O.:240] Out: 300 [Urine:300] Intake/Output this shift: Total I/O In: 880 [P.O.:880] Out: 150 [Urine:150]  GPE: Not done today as patient was having his dinner.  Lab Results:  Recent Labs  08/04/13 1024 08/05/13 0425  WBC  --  10.0  HGB 9.6* 8.1*  HCT 28.1* 23.2*  PLT  --  155   BMET  Recent Labs  08/04/13 1600  NA 140  K 5.1  CL 106  CO2 24  GLUCOSE 137*  BUN 79*  CREATININE 1.77*  CALCIUM 9.3   LFT  Recent Labs  08/04/13 1600  PROT 6.7  ALBUMIN 3.4*  AST 21  ALT 23  ALKPHOS 75  BILITOT 0.4   PT/INR  Recent Labs  08/04/13 1600  LABPROT 14.5  INR 1.15   Studies/Results: X-ray Chest Pa And Lateral   08/04/2013   CLINICAL DATA:  Shortness of breath. Rales on exam left lung base.  EXAM: CHEST  2 VIEW  COMPARISON:  02/14/2011  FINDINGS: Chronic elevation of right hemidiaphragm is again seen. Both lungs are clear. No evidence of pleural effusion. Heart size is stable and within normal limits allowing for low lung volumes. No definite mass or lymphadenopathy identified.  IMPRESSION: Chronic elevation right hemidiaphragm. No active disease.   Electronically Signed   By: Myles Rosenthal   On: 08/04/2013 13:44   Medications: I have reviewed the patient's current medications.  Assessment/Plan: 1) Duodenal ulcers probably secondary to  NSAIDS. Improving on PPI's.  2) Esophageal varices-too small to band.  He should follow up with GI in 2 weeks.   LOS: 1 day   Gabi Mcfate 08/05/2013, 6:33 PM

## 2013-08-06 ENCOUNTER — Other Ambulatory Visit: Payer: Self-pay | Admitting: Internal Medicine

## 2013-08-06 DIAGNOSIS — D649 Anemia, unspecified: Secondary | ICD-10-CM

## 2013-08-06 DIAGNOSIS — N289 Disorder of kidney and ureter, unspecified: Secondary | ICD-10-CM

## 2013-08-06 LAB — BASIC METABOLIC PANEL
CO2: 22 mEq/L (ref 19–32)
Calcium: 8.1 mg/dL — ABNORMAL LOW (ref 8.4–10.5)
Chloride: 98 mEq/L (ref 96–112)
Creatinine, Ser: 1.38 mg/dL — ABNORMAL HIGH (ref 0.50–1.35)
GFR calc non Af Amer: 46 mL/min — ABNORMAL LOW (ref >90)
Glucose, Bld: 139 mg/dL — ABNORMAL HIGH (ref 70–99)
Potassium: 4.2 mEq/L (ref 3.5–5.1)
Sodium: 131 mEq/L — ABNORMAL LOW (ref 135–145)

## 2013-08-06 LAB — HEMOGLOBIN AND HEMATOCRIT, BLOOD: HCT: 22.9 % — ABNORMAL LOW (ref 39.0–52.0)

## 2013-08-06 NOTE — Progress Notes (Signed)
Pt ready for discharge to home with wife.  All meds reviewed with pt and copy given of discharge instructions.  Pt will RTO visit this week on Oct. 2.

## 2013-08-06 NOTE — Progress Notes (Signed)
Subjective: Feeling well. Reports BM lighter in  color  Objective: Lab:  Recent Labs  08/04/13 1024 08/05/13 0425 08/06/13 0400  WBC  --  10.0  --   HGB 9.6* 8.1* 8.1*  HCT 28.1* 23.2* 22.9*  MCV  --  92.8  --   PLT  --  155  --     Recent Labs  08/04/13 1600 08/06/13 0400  NA 140 131*  K 5.1 4.2  CL 106 98  GLUCOSE 137* 139*  BUN 79* 39*  CREATININE 1.77* 1.38*  CALCIUM 9.3 8.1*    Imaging:  Scheduled Meds: . HYDROcodone-acetaminophen  0.5 tablet Oral BID   And  . acetaminophen  325 mg Oral BID  . amLODipine  10 mg Oral Daily  . furosemide  20 mg Oral Daily  . metoprolol succinate  100 mg Oral Daily  . pantoprazole  40 mg Oral BID  . spironolactone  25 mg Oral Daily  . thiamine  100 mg Intravenous Daily   Continuous Infusions:  PRN Meds:.ondansetron (ZOFRAN) IV, ondansetron, zolpidem   Physical Exam: Filed Vitals:   08/06/13 0539  BP: 120/56  Pulse: 71  Temp: 98.1 F (36.7 C)  Resp: 18   Se d/c summary     Assessment/Plan: Hgb stable, no sign of active bleed. For d/c/ home  Dictated # [id number]   Illene Regulus [facility] ([phone]; (c) [phone] Call-grp - [facility]  Tele: [phone]  08/06/2013, 10:28 AM

## 2013-08-07 ENCOUNTER — Encounter: Payer: Self-pay | Admitting: Gastroenterology

## 2013-08-07 ENCOUNTER — Telehealth: Payer: Self-pay | Admitting: Internal Medicine

## 2013-08-07 ENCOUNTER — Encounter (HOSPITAL_COMMUNITY): Payer: Self-pay | Admitting: Gastroenterology

## 2013-08-07 NOTE — Discharge Summary (Signed)
[name], CHERVENAK NO.:  [id number]  MEDICAL RECORD NO.:  [id number]  LOCATION:  6N04C                        FACILITY:  [facility]  PHYSICIAN:  [name] Gess. Norins, MD  DATE OF BIRTH:  11/06/1929  DATE OF ADMISSION:  08/04/2013 DATE OF DISCHARGE:  08/06/2013                              DISCHARGE SUMMARY   ADMITTING DIAGNOSIS:  Upper GI bleed.  DISCHARGE DIAGNOSES: 1. Upper GI bleed from duodenal ulcer, presence of     gastropathy/gastritis, presence of varices. 2. Cardiac history of hypertrophic cardiomyopathy, with question of     rales on admission.  CONSULTANTS:  [name] T. Russella Dar, MD, [facility].  PROCEDURES:  Imaging: 1. chest x-ray, 2-view, on day of admission with chronic elevation, right hemidiaphragm.  No active disease.  PROCEDURE:  Upper endoscopy performed on August 04, 2013, with two, the small varices in the distal third of the esophagus.  Erosive gastritis in the gastric antrum.  Gastritis in the cardia, gastric fundus and gastric body.  Single nonbleeding ulcer in the duodenal bulb. Single nonbleeding ulcer in the second part of duodenum.  Erosive duodenitis in the second part of the duodenum.  HISTORY OF PRESENT ILLNESS:  [name] presented to the office with a 1-week history of persistent melanotic stools.  He denied any diarrhea, nausea, vomiting, abdominal pain, hematemesis.  He denies any chest pain, but did note significant increased dyspnea on exertion on several days prior to admission.  He reports he has had a poor appetite.  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  Last EGD was January 2006, which revealed portal gastropathy.  The patient does have a history of hypertrophic obstructive cardiomyopathy with a question of CHF in the past.  Last 2D echo on March 03, 2011, with an EF of 55-65%, grade 1 diastolic dysfunction.  On  presentation, patient was pale, and noticed to be short of breath. On rectal exam, had heme-positive stool.  He was admitted with probable upper GI bleed.  Please see the H and P for past medical history, family history, and social history.  HOSPITAL COURSE: 1. GI bleed.  The patient was taken to the endoscopy suite on the day     of admission, was found to have duodenal ulcer and gastritis is     noted.  The patient at admission had been started on IV Protonix.  Recommendation was for the patient to continue on proton pump inhibitor, full-dose twice daily for at least 1 month and then once daily chronically.  The patient was told to discontinue nonsteroidal anti- inflammatory drugs including his daily aspirin so he has had a chance to heal.  The patient's hemoglobin was 9.6 on the day of admission, dropping to 8.1 g on the first full hospital day.  Followup H and H on the day of discharge, remained stable at 8.1 g.  The patient had no abdominal pain or discomfort.  He reports he had stools, but there was a lightening of stool color with no frank hematochezia.  The patient having stable hemoglobin with endoscopy being performed with the patient being pain free,  at this point, he is ready for discharge to home.  He will have a hemoglobin and hematocrit for follow up on August 10, 2013, along with a followup in office.  2. Cardiac.  The patient had normal BNP.  A 12-lead EKG was     unremarkable with no acute changes.  Chest x-ray showed no     pulmonary edema.  He is deemed stable and we will continue on his     home medications.  3. Renal insufficiency.  The patient had admission creatinine of 1.77     up from his baseline of 1.26.  The patient was vigorously hydrated     for 24 hours.  He did have a followup creatinine on the day of     discharge, which is stabilized at 1.38.  PLAN:  The patient to continue his present medications.  He will have a followup creatinine on August 10, 2013.  With the patient's problems being stable with no sign of active bleeding with his laboratory work being stable at this time, he is ready for discharge to home.  DISCHARGE EXAMINATION:  VITAL SIGNS:  Temperature was 98.1, blood pressure 120/56, heart rate 71, respirations 18, oxygen saturations is 95%. GENERAL APPEARANCE:  This is a well nourished, older gentleman in no acute distress. HEENT:  Normocephalic, atraumatic.  Conjunctiva and sclerae was clear. CARDIOVASCULAR:  2+ radial pulses.  Precordium was quiet.  He had a regular rate and rhythm.  PULMONARY:  Patient is moving air well with no increased work of breathing.  No rales or wheezes. ABDOMEN:  Soft.  No guarding or rebound.  Positive bowel sounds. NEURO:  Patient is awake, alert.  He is oriented to person, place, time, and context.  FINAL LABORATORY:  On the day of discharge, hemoglobin 8.1 g, hematocrit 22.9%.  Chemistries with a sodium of 131, potassium 4.2, chloride 98, CO2 of 22, BUN of 39, creatinine 1.38, calcium was 8.1 g.  Imaging as above.  DISCHARGE MEDICATIONS:  Acetaminophen 650 mg q.6 p.r.n., Norvasc 10 mg p.o. daily.  The patient is to discontinue aspirin.  The patient will resume Dexilant 60 mg once daily and resume furosemide 20 mg daily. Vicodin 2.5/325 b.i.d. p.r.n., metoprolol-XL 100 mg daily, multivitamin daily, simvastatin 20 mg daily, spironolactone 25 mg daily, zolpidem 5 mg at bedtime p.r.n.  DISPOSITION:  The patient is discharged to home.  He will be seen in the office in followup on August 10, 2013, at which time, he will have both an H and H as well as BMET.  The patient's condition at time of discharge dictation is stable.     [name] Gess Norins, MD     MEN/MEDQ  D:  08/06/2013  T:  08/07/2013  Job:  [id number]  cc:   [name] Rea. Jarold Motto, MD, [name]

## 2013-08-07 NOTE — Telephone Encounter (Signed)
OK for refill on vicodin as on current med list. OK to send in Dexilant refill under my name.

## 2013-08-07 NOTE — Telephone Encounter (Signed)
Pt wants an RX for Vicodin that is on his discharge summary.   He also wants Dr. Debby Bud instead of Dr. Jarold Motto to send an RX for Dexilant to his pharmacy.  Sheliah Plane.

## 2013-08-08 ENCOUNTER — Telehealth: Payer: Self-pay

## 2013-08-08 MED ORDER — DEXLANSOPRAZOLE 60 MG PO CPDR: 60.0000 mg | DELAYED_RELEASE_CAPSULE | Freq: Every day | ORAL | Status: DC

## 2013-08-08 MED ORDER — HYDROCODONE-ACETAMINOPHEN 2.5-500 MG PO TABS: 1.0000 | ORAL_TABLET | Freq: Two times a day (BID) | ORAL | Status: DC

## 2013-08-08 MED ORDER — HYDROCODONE-ACETAMINOPHEN 5-325 MG PO TABS: 1.0000 | ORAL_TABLET | Freq: Two times a day (BID) | ORAL | Status: DC

## 2013-08-08 MED ORDER — HYDROCODONE-ACETAMINOPHEN 7.5-325 MG PO TABS: 1.0000 | ORAL_TABLET | Freq: Two times a day (BID) | ORAL | Status: DC

## 2013-08-08 NOTE — Telephone Encounter (Signed)
Ok for 5/325 Thanks

## 2013-08-08 NOTE — Telephone Encounter (Signed)
Dexilant has been sent to [facility]. Vicodin has been called to [facility].

## 2013-08-08 NOTE — Telephone Encounter (Signed)
You advised me to call in Vicodin as is on med list. They do not have 2.5/500 mg. He takes 5/325 mg. Is this dose change fine? Please advise  Directions also

## 2013-08-08 NOTE — Telephone Encounter (Signed)
rx called to [id number], no refills

## 2013-08-10 ENCOUNTER — Telehealth: Payer: Self-pay | Admitting: Internal Medicine

## 2013-08-10 ENCOUNTER — Encounter: Payer: Self-pay | Admitting: Internal Medicine

## 2013-08-10 ENCOUNTER — Other Ambulatory Visit: Payer: Medicare Other

## 2013-08-10 ENCOUNTER — Ambulatory Visit (INDEPENDENT_AMBULATORY_CARE_PROVIDER_SITE_OTHER): Payer: Medicare Other | Admitting: Internal Medicine

## 2013-08-10 VITALS — BP 96/40 | HR 75 | Temp 98.0°F | Wt 166.0 lb

## 2013-08-10 DIAGNOSIS — K260 Acute duodenal ulcer with hemorrhage: Secondary | ICD-10-CM

## 2013-08-10 DIAGNOSIS — N289 Disorder of kidney and ureter, unspecified: Secondary | ICD-10-CM

## 2013-08-10 DIAGNOSIS — D649 Anemia, unspecified: Secondary | ICD-10-CM

## 2013-08-10 LAB — COMPREHENSIVE METABOLIC PANEL
Albumin: 3.4 g/dL — ABNORMAL LOW (ref 3.5–5.2)
Alkaline Phosphatase: 75 U/L (ref 39–117)
BUN: 28 mg/dL — ABNORMAL HIGH (ref 6–23)
CO2: 25 mEq/L (ref 19–32)
Calcium: 8.7 mg/dL (ref 8.4–10.5)
Chloride: 104 mEq/L (ref 96–112)
Creatinine, Ser: 1.6 mg/dL — ABNORMAL HIGH (ref 0.4–1.5)
GFR: 45.49 mL/min — ABNORMAL LOW (ref >60.00)
Glucose, Bld: 125 mg/dL — ABNORMAL HIGH (ref 70–99)
Potassium: 4.6 mEq/L (ref 3.5–5.1)
Total Protein: 6.5 g/dL (ref 6.0–8.3)

## 2013-08-10 NOTE — Telephone Encounter (Signed)
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 Debby Bud this afternoon

## 2013-08-10 NOTE — Telephone Encounter (Signed)
Patient notified of results.

## 2013-08-10 NOTE — Telephone Encounter (Signed)
Patient was just seen in the office this morning

## 2013-08-10 NOTE — Progress Notes (Signed)
  Subjective:    Patient ID: [name], male    DOB: 05-02-1929, 79 y.o.   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 His Hgb was 8.1 at discharge. He did have EGD and had no active bleeding. Since d/c he has been feeling a little weak and a little SOB with activity. He has been taking iron.   PMH, FamHx and SocHx reviewed for any changes and relevance.  Current Outpatient Prescriptions on File Prior to Visit  Medication Sig Dispense Refill  . acetaminophen (TYLENOL) 325 MG tablet Take 650 mg by mouth every 6 (six) hours as needed for pain.      Marland Kitchen amLODipine (NORVASC) 10 MG tablet Take 1 tablet (10 mg total) by mouth daily.  90 tablet  3  . dexlansoprazole (DEXILANT) 60 MG capsule Take 1 capsule (60 mg total) by mouth daily.  30 capsule  5  . furosemide (LASIX) 20 MG tablet Take 1 tablet (20 mg total) by mouth daily.  30 tablet  1  . HYDROcodone-acetaminophen (NORCO/VICODIN) 5-325 MG per tablet Take 1 tablet by mouth 2 (two) times daily.  60 tablet  0  . metoprolol succinate (TOPROL-XL) 100 MG 24 hr tablet TAKE ONE AND ONE-HALF TABLETS DAILY -TAKE WITH OR IMMEDIATELY FOLLOWING A MEAL  45 tablet  1  . Multiple Vitamin (MULTIVITAMIN WITH MINERALS) TABS Take 1 tablet by mouth daily.      . simvastatin (ZOCOR) 20 MG tablet Take 20 mg by mouth daily.      Marland Kitchen spironolactone (ALDACTONE) 25 MG tablet Take 1 tablet (25 mg total) by mouth daily.  30 tablet  6  . zolpidem (AMBIEN) 5 MG tablet Take 5 mg by mouth at bedtime as needed for sleep.       No current facility-administered medications on file prior to visit.      Review of Systems System review is negative for any constitutional, cardiac, pulmonary, GI or neuro symptoms or complaints other than as described in the HPI.     Objective:   Physical Exam Filed Vitals:   08/10/13 0920  BP: 96/40  Pulse: 75  Temp: 98 F (36.7 C)   gen'l- elderly man in no acute distress HEENT- C&S clear, no  icterus Cor- RRR Abd - no HSM, no guarding or rebound, no tenderness Neuro - A&O x 3, ambulates with a cane.        Assessment & Plan:  Acute renal insufficiency - at recent hospitalization for GI Bleed serum creatinine rose to 1.77 from normal baseline.Since d/c he has been adequately hydrating. Follow up lab today:  BMET    Component Value Date/Time   NA 136 08/10/2013 0858   K 4.6 08/10/2013 0858   CL 104 08/10/2013 0858   CO2 25 08/10/2013 0858   GLUCOSE 125* 08/10/2013 0858   BUN 28* 08/10/2013 0858   CREATININE 1.6* 08/10/2013 0858   CALCIUM 8.7 08/10/2013 0858   GFRNONAA 46* 08/06/2013 0400   GFRAA 53* 08/06/2013 0400    Plan Continue to be conscientious about hydration.  Will need follow up lab in 1 month

## 2013-08-10 NOTE — Telephone Encounter (Signed)
Please call patient: hgb 9.1 g, Cr 1.6.  Please be sure to hydrate.

## 2013-08-13 ENCOUNTER — Other Ambulatory Visit: Payer: Self-pay | Admitting: Internal Medicine

## 2013-08-13 NOTE — Assessment & Plan Note (Signed)
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 He stabized with a Hgb approx 8 gr. He did not elect to have transfusion. Since d/c he has been regaining strength although still feeling weak, has not seen any signs of bleeding and is following an iron rich diet. He had follow up lab today:  Lab Results  Component Value Date   HGB 9.2* 08/10/2013   Plan Continue BID PPI therapy  Continue dietary iron and iron supplement

## 2013-08-16 ENCOUNTER — Telehealth: Payer: Self-pay

## 2013-08-16 NOTE — Telephone Encounter (Signed)
He was just seen last week!! Had Hgb checked. He was instructed to take iron or iron rich diet and return in a month for follow up Hgb.

## 2013-08-16 NOTE — Telephone Encounter (Signed)
Phone call from patient [phone] stating he was recently discharged from the hospital. He would like to know if he should f/u with you in the office and have his hgb rechecked? Please advise.

## 2013-08-16 NOTE — Telephone Encounter (Signed)
Left a message for patient that he is to take iron or iron rich diet and follow up in 1 month and hgb check at that time.

## 2013-09-08 ENCOUNTER — Telehealth: Payer: Self-pay | Admitting: Internal Medicine

## 2013-09-08 NOTE — Telephone Encounter (Signed)
Review of record. Spoke w/ patient.  Lab orders in the system to be drawn on 11/3 confirmed his appt on 09/13/13 @ 2pm with Dr. Debby Bud . Pt will come on Monday to have his labs drawn.

## 2013-09-08 NOTE — Telephone Encounter (Signed)
Pt request lab work for CBC or hemoglobin prior to the appt on 09/13/13 due to GI bleeding that he was in the hospital for about a month ago. Please advise.

## 2013-09-11 ENCOUNTER — Other Ambulatory Visit: Payer: Self-pay | Admitting: Internal Medicine

## 2013-09-11 ENCOUNTER — Other Ambulatory Visit (INDEPENDENT_AMBULATORY_CARE_PROVIDER_SITE_OTHER): Payer: Medicare Other

## 2013-09-11 DIAGNOSIS — K260 Acute duodenal ulcer with hemorrhage: Secondary | ICD-10-CM

## 2013-09-11 DIAGNOSIS — D649 Anemia, unspecified: Secondary | ICD-10-CM

## 2013-09-11 DIAGNOSIS — N289 Disorder of kidney and ureter, unspecified: Secondary | ICD-10-CM

## 2013-09-11 DIAGNOSIS — I10 Essential (primary) hypertension: Secondary | ICD-10-CM

## 2013-09-11 LAB — COMPREHENSIVE METABOLIC PANEL
ALT: 20 U/L (ref 0–53)
AST: 28 U/L (ref 0–37)
Albumin: 4.3 g/dL (ref 3.5–5.2)
Alkaline Phosphatase: 84 U/L (ref 39–117)
Chloride: 106 mEq/L (ref 96–112)
Glucose, Bld: 106 mg/dL — ABNORMAL HIGH (ref 70–99)
Potassium: 4.9 mEq/L (ref 3.5–5.1)
Sodium: 138 mEq/L (ref 135–145)
Total Bilirubin: 0.7 mg/dL (ref 0.3–1.2)
Total Protein: 7.8 g/dL (ref 6.0–8.3)

## 2013-09-13 ENCOUNTER — Encounter: Payer: Self-pay | Admitting: Internal Medicine

## 2013-09-13 ENCOUNTER — Ambulatory Visit (INDEPENDENT_AMBULATORY_CARE_PROVIDER_SITE_OTHER): Payer: Medicare Other | Admitting: Internal Medicine

## 2013-09-13 VITALS — BP 142/60 | HR 71 | Temp 97.9°F | Wt 168.0 lb

## 2013-09-13 DIAGNOSIS — I359 Nonrheumatic aortic valve disorder, unspecified: Secondary | ICD-10-CM

## 2013-09-13 DIAGNOSIS — I350 Nonrheumatic aortic (valve) stenosis: Secondary | ICD-10-CM | POA: Insufficient documentation

## 2013-09-13 DIAGNOSIS — K260 Acute duodenal ulcer with hemorrhage: Secondary | ICD-10-CM

## 2013-09-13 NOTE — Assessment & Plan Note (Signed)
Stable murmur

## 2013-09-13 NOTE — Assessment & Plan Note (Signed)
Doing well. Hgb back to normal 12.4 g  Plan Continue dexilant

## 2013-09-13 NOTE — Progress Notes (Signed)
  Subjective:    Patient ID: [name], male    DOB: May 01, 1929, 79 y.o.   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 He had repeat lab work: Hgb 12.4g and Creatinne 1.4. He has not seen any signs of bleeding. He is not feeling as 'Peppy" as he would like. He has been taking Dexilant w/po problems  PMH, FamHx and SocHx reviewed for any changes and relevance.  Current Outpatient Prescriptions on File Prior to Visit  Medication Sig Dispense Refill  . acetaminophen (TYLENOL) 325 MG tablet Take 650 mg by mouth every 6 (six) hours as needed for pain.      Marland Kitchen amLODipine (NORVASC) 10 MG tablet Take 1 tablet (10 mg total) by mouth daily.  90 tablet  3  . dexlansoprazole (DEXILANT) 60 MG capsule Take 1 capsule (60 mg total) by mouth daily.  30 capsule  5  . furosemide (LASIX) 20 MG tablet TAKE ONE TABLET BY MOUTH ONCE DAILY  30 tablet  5  . HYDROcodone-acetaminophen (NORCO/VICODIN) 5-325 MG per tablet Take 1 tablet by mouth 2 (two) times daily.  60 tablet  0  . metoprolol succinate (TOPROL-XL) 100 MG 24 hr tablet TAKE ONE AND ONE-HALF TABLETS DAILY -TAKE WITH OR IMMEDIATELY FOLLOWING A MEAL  45 tablet  5  . Multiple Vitamin (MULTIVITAMIN WITH MINERALS) TABS Take 1 tablet by mouth daily.      . simvastatin (ZOCOR) 20 MG tablet Take 20 mg by mouth daily.      Marland Kitchen spironolactone (ALDACTONE) 25 MG tablet Take 1 tablet (25 mg total) by mouth daily.  30 tablet  6  . zolpidem (AMBIEN) 5 MG tablet Take 5 mg by mouth at bedtime as needed for sleep.       No current facility-administered medications on file prior to visit.      Review of Systems System review is negative for any constitutional, cardiac, pulmonary, GI or neuro symptoms or complaints other than as described in the HPI.     Objective:   Physical Exam Filed Vitals:   09/13/13 1421  BP: 142/60  Pulse: 71  Temp: 97.9 F (36.6 C)   Gen'l- Slender man in no distress HEENT- C&S clear Cor -  RRR II/VI systolic murmur best at RSB, also at LSB PUlm - normal respirations Abd-Soft, BS+, no guarding or rebound Neuro- A&O x 3       Assessment & Plan:

## 2013-09-13 NOTE — Progress Notes (Signed)
Pre-visit discussion using our clinic review tool. No additional management support is needed unless otherwise documented below in the visit note.  

## 2013-09-15 ENCOUNTER — Telehealth: Payer: Self-pay | Admitting: Internal Medicine

## 2013-09-15 NOTE — Telephone Encounter (Signed)
Ok to resume Aspirin

## 2013-09-15 NOTE — Telephone Encounter (Signed)
Pt was seen this week.  He wants to know if he can start back on daily aspirin.

## 2013-09-18 NOTE — Telephone Encounter (Signed)
Pt has been informed.

## 2013-10-09 ENCOUNTER — Other Ambulatory Visit: Payer: Self-pay | Admitting: Internal Medicine

## 2013-10-16 ENCOUNTER — Encounter: Payer: Self-pay | Admitting: Internal Medicine

## 2013-10-16 ENCOUNTER — Other Ambulatory Visit: Payer: Self-pay | Admitting: Internal Medicine

## 2013-10-16 ENCOUNTER — Telehealth: Payer: Self-pay | Admitting: Internal Medicine

## 2013-10-16 ENCOUNTER — Ambulatory Visit (INDEPENDENT_AMBULATORY_CARE_PROVIDER_SITE_OTHER): Payer: Medicare Other | Admitting: Internal Medicine

## 2013-10-16 ENCOUNTER — Other Ambulatory Visit: Payer: Medicare Other

## 2013-10-16 VITALS — BP 150/60 | HR 75 | Temp 97.9°F | Wt 169.4 lb

## 2013-10-16 DIAGNOSIS — D649 Anemia, unspecified: Secondary | ICD-10-CM

## 2013-10-16 DIAGNOSIS — K260 Acute duodenal ulcer with hemorrhage: Secondary | ICD-10-CM

## 2013-10-16 NOTE — Progress Notes (Signed)
Pre visit review using our clinic review tool, if applicable. No additional management support is needed unless otherwise documented below in the visit note. 

## 2013-10-16 NOTE — Assessment & Plan Note (Signed)
C/o increased weakness and SOB. Hgb is 12.0 g, last was 12.4  No sign of  Bleeding.  Plan Patient to call for progressive symptoms.

## 2013-10-16 NOTE — Telephone Encounter (Signed)
Offered OV today and lab first.

## 2013-10-16 NOTE — Telephone Encounter (Signed)
Pt thinks he is loosing blood in the GI track.  He has an ulcer.  He is getting weaker over past few days.  Would like to be seen today.  Can leave a message on [phone].

## 2013-10-16 NOTE — Progress Notes (Signed)
   Subjective:    Patient ID: [name], male    DOB: 07/25/1929, 79 y.o.   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 He had called c/o increased weakness and SOB. He has had a recent GI bleed and is concerned that his Hgb may have dropped. He has had no signs of active bleeding.   PMH, FamHx and SocHx reviewed for any changes and relevance. Current Outpatient Prescriptions on File Prior to Visit  Medication Sig Dispense Refill  . acetaminophen (TYLENOL) 325 MG tablet Take 650 mg by mouth every 6 (six) hours as needed for pain.      Marland Kitchen amLODipine (NORVASC) 10 MG tablet Take 1 tablet (10 mg total) by mouth daily.  90 tablet  3  . dexlansoprazole (DEXILANT) 60 MG capsule Take 1 capsule (60 mg total) by mouth daily.  30 capsule  5  . furosemide (LASIX) 20 MG tablet TAKE ONE TABLET BY MOUTH ONCE DAILY  30 tablet  5  . HYDROcodone-acetaminophen (NORCO/VICODIN) 5-325 MG per tablet Take 1 tablet by mouth 2 (two) times daily.  60 tablet  0  . metoprolol succinate (TOPROL-XL) 100 MG 24 hr tablet TAKE ONE AND ONE-HALF TABLETS DAILY -TAKE WITH OR IMMEDIATELY FOLLOWING A MEAL  45 tablet  5  . Multiple Vitamin (MULTIVITAMIN WITH MINERALS) TABS Take 1 tablet by mouth daily.      . simvastatin (ZOCOR) 20 MG tablet Take 20 mg by mouth daily.      . simvastatin (ZOCOR) 20 MG tablet TAKE ONE TABLET AT BEDTIME  90 tablet  3  . spironolactone (ALDACTONE) 25 MG tablet Take 1 tablet (25 mg total) by mouth daily.  30 tablet  6  . zolpidem (AMBIEN) 5 MG tablet Take 5 mg by mouth at bedtime as needed for sleep.       No current facility-administered medications on file prior to visit.      Review of Systems System review is negative for any constitutional, cardiac, pulmonary, GI or neuro symptoms or complaints other than as described in the HPI.     Objective:   Physical Exam Filed Vitals:   10/16/13 1500  BP: 150/60  Pulse: 75  Temp: 97.9 F (36.6 C)   Gen'l- elderly man in  no active distress Cor - RRR Pulm - normal respirations Neuro - A&O, walks with a cane.   Hgb 12.0 g       Assessment & Plan:

## 2013-10-31 ENCOUNTER — Encounter: Payer: Self-pay | Admitting: Internal Medicine

## 2013-12-19 ENCOUNTER — Ambulatory Visit: Payer: Self-pay | Admitting: Internal Medicine

## 2013-12-19 DIAGNOSIS — Z0289 Encounter for other administrative examinations: Secondary | ICD-10-CM

## 2013-12-27 ENCOUNTER — Ambulatory Visit: Payer: Self-pay | Admitting: Internal Medicine

## 2014-01-01 ENCOUNTER — Ambulatory Visit (INDEPENDENT_AMBULATORY_CARE_PROVIDER_SITE_OTHER): Payer: Medicare Other | Admitting: Internal Medicine

## 2014-01-01 ENCOUNTER — Encounter: Payer: Self-pay | Admitting: Internal Medicine

## 2014-01-01 VITALS — BP 128/70 | HR 70 | Temp 97.9°F | Wt 165.8 lb

## 2014-01-01 DIAGNOSIS — I421 Obstructive hypertrophic cardiomyopathy: Secondary | ICD-10-CM

## 2014-01-01 DIAGNOSIS — K260 Acute duodenal ulcer with hemorrhage: Secondary | ICD-10-CM

## 2014-01-01 NOTE — Progress Notes (Signed)
Pre visit review using our clinic review tool, if applicable. No additional management support is needed unless otherwise documented below in the visit note. 

## 2014-01-01 NOTE — Patient Instructions (Signed)
Good to see you.  You do not have any overt signs of heart failure at today's visit. Plan 2D echo - last study in 2012 with a normal ejection fraction, aortic stenosis mild, grade I diastolic dysfunction.  Continue your present medications including furosemide once a day  Establish with Dr. Julianne Handler - we will schedule an appointment for you.

## 2014-01-01 NOTE — Assessment & Plan Note (Addendum)
Increasing shortness of breath and some pedal edema.  Plan 2D echo  Establish with Dr. Julianne Handler - formerly seen by Dr. Lia Foyer.   Continue present medications pending echo and consult.

## 2014-01-02 ENCOUNTER — Encounter: Payer: Self-pay | Admitting: Internal Medicine

## 2014-01-02 NOTE — Assessment & Plan Note (Signed)
Stable with last Hgb 12 g. No report of weakness or dark stools.

## 2014-01-02 NOTE — Progress Notes (Signed)
Subjective:    Patient ID: [name], male    DOB: [birth year], 80 y.o.   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 He has a h/o aortic stenosis and Hypertrophic obstructive cardiomyopathy and was follow by Dr. Coralie Keens and then Dr. Lia Foyer. He has had these increased symptoms for several weeks. He denies any chest pain or major limitations in his activity.  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 He has been doing well: denies any dark or black stools, has not had recurrent weakness.  Lab Results  Component Value Date   HGB 12.0* 10/16/2013     Past Medical History  Diagnosis Date  . Hypertrophic cardiomyopathy     a.  echo 8/10: EF 55-65%, basal septal hypertrophy and small LVOT but no sig. gradient and no SAM, mod LVH, mild AI, mild LAE (IVS ED 13.6 mm);    b.  echo 4/12: EF  55-65%, mild LVH, Gr 1 diast dysfxn, very mild AS, mod AI,   . HTN (hypertension)   . HLD (hyperlipidemia)   . Alcoholic cirrhosis   . GERD (gastroesophageal reflux disease)   . Renal insufficiency   . Alcoholism   . 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 Rhabdomyolysis 01/2005  . Colon polyp perhaps in 1990s    pathology/type not known  . Diverticulosis of colon perhaps in 1990s  . Ascites 11/2004    "early" SBP on paracentesis.   . Portal hypertensive gastropathy 2006  . Fracture of wrist 2014 spring vs early summer    refused orthopods suggestion to set the bone.    Past Surgical History  Procedure Laterality Date  . Inguinal hernia repair    . Melanoma excision Left 02/2011    malignant melanoma of thigh  . Basal cell carcinoma excision Left 11/2012    lower eyelid  . Esophagogastroduodenoscopy N/A 08/04/2013    Procedure: ESOPHAGOGASTRODUODENOSCOPY (EGD);  Surgeon: Ladene Artist, MD;  Location: [facility] ENDOSCOPY;  Service: Endoscopy;  Laterality: N/A;   Family History  Problem Relation Age of Onset  . Colon cancer Father   .  Heart disease Mother    History   Social History  . Marital Status: Married    Spouse Name: N/A    Number of Children: 4  . Years of Education: 24   Occupational History  . pathologist     retired   Social History Main Topics  . Smoking status: Never Smoker   . Smokeless tobacco: Never Used  . Alcohol Use: No     Comment: member of AA  . Drug Use: No  . Sexual Activity: Not on file   Other Topics Concern  . 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  Remains independent. ACP- not discussed, will approach at next OV    Current Outpatient Prescriptions on File Prior to Visit  Medication Sig Dispense Refill  . acetaminophen (TYLENOL) 325 MG tablet Take 650 mg by mouth every 6 (six) hours as needed for pain.      Marland Kitchen amLODipine (NORVASC) 10 MG tablet Take 1 tablet (10 mg total) by mouth daily.  90 tablet  3  . dexlansoprazole (DEXILANT) 60 MG capsule Take 1 capsule (60 mg total) by mouth daily.  30 capsule  5  . furosemide (LASIX) 20 MG tablet TAKE ONE TABLET BY MOUTH ONCE DAILY  30 tablet  5  .  HYDROcodone-acetaminophen (NORCO/VICODIN) 5-325 MG per tablet Take 1 tablet by mouth 2 (two) times daily.  60 tablet  0  . metoprolol succinate (TOPROL-XL) 100 MG 24 hr tablet TAKE ONE AND ONE-HALF TABLETS DAILY -TAKE WITH OR IMMEDIATELY FOLLOWING A MEAL  45 tablet  5  . Multiple Vitamin (MULTIVITAMIN WITH MINERALS) TABS Take 1 tablet by mouth daily.      . simvastatin (ZOCOR) 20 MG tablet TAKE ONE TABLET AT BEDTIME  90 tablet  3  . spironolactone (ALDACTONE) 25 MG tablet Take 1 tablet (25 mg total) by mouth daily.  30 tablet  6  . zolpidem (AMBIEN) 5 MG tablet Take 5 mg by mouth at bedtime as needed for sleep.       No current facility-administered medications on file prior to visit.      Review of Systems System review is negative for any constitutional, cardiac, pulmonary, GI or neuro symptoms or complaints other than as  described in the HPI.     Objective:   Physical Exam Filed Vitals:   01/01/14 1514  BP: 128/70  Pulse: 70  Temp: 97.9 F (36.6 C)   Wt Readings from Last 3 Encounters:  01/01/14 165 lb 12.8 oz (75.206 kg)  10/16/13 169 lb 6.4 oz (76.839 kg)  09/13/13 168 lb (76.204 kg)   Gen'l - aging man in no acute distress HEENT - C&S clear Neck -supple, no JVD sitting Cor 2+ radial pulse, II-III/VI murmur best at RSB but heart LSB as well. Pulm - no increased WOB, lungs are CTAP Neuro - awake and alert.        Assessment & Plan:

## 2014-01-05 ENCOUNTER — Other Ambulatory Visit: Payer: Self-pay | Admitting: Internal Medicine

## 2014-01-09 ENCOUNTER — Other Ambulatory Visit: Payer: Self-pay | Admitting: Dermatology

## 2014-01-22 ENCOUNTER — Other Ambulatory Visit: Payer: Self-pay | Admitting: Cardiology

## 2014-01-23 ENCOUNTER — Encounter: Payer: Self-pay | Admitting: Cardiovascular Disease

## 2014-01-23 ENCOUNTER — Ambulatory Visit (HOSPITAL_COMMUNITY): Payer: Medicare Other | Attending: Cardiovascular Disease | Admitting: Radiology

## 2014-01-23 DIAGNOSIS — I421 Obstructive hypertrophic cardiomyopathy: Secondary | ICD-10-CM

## 2014-01-23 DIAGNOSIS — R0602 Shortness of breath: Secondary | ICD-10-CM

## 2014-01-23 NOTE — Progress Notes (Signed)
Echocardiogram Performed. 

## 2014-01-26 ENCOUNTER — Encounter: Payer: Self-pay | Admitting: Internal Medicine

## 2014-01-29 ENCOUNTER — Other Ambulatory Visit: Payer: Self-pay | Admitting: Internal Medicine

## 2014-02-06 ENCOUNTER — Encounter: Payer: Self-pay | Admitting: Cardiovascular Disease

## 2014-02-06 ENCOUNTER — Ambulatory Visit (INDEPENDENT_AMBULATORY_CARE_PROVIDER_SITE_OTHER): Payer: Medicare Other | Admitting: Cardiovascular Disease

## 2014-02-06 VITALS — BP 140/64 | HR 66 | Ht 68.0 in | Wt 167.0 lb

## 2014-02-06 DIAGNOSIS — I10 Essential (primary) hypertension: Secondary | ICD-10-CM

## 2014-02-06 DIAGNOSIS — R609 Edema, unspecified: Secondary | ICD-10-CM

## 2014-02-06 DIAGNOSIS — E785 Hyperlipidemia, unspecified: Secondary | ICD-10-CM

## 2014-02-06 DIAGNOSIS — I422 Other hypertrophic cardiomyopathy: Secondary | ICD-10-CM

## 2014-02-06 DIAGNOSIS — R600 Localized edema: Secondary | ICD-10-CM

## 2014-02-06 NOTE — Progress Notes (Signed)
History of Present Illness: 80 yo male with history of hypertrophic cardiomyopathy, HTN, HLD, alcohol abuse here today for cardiac follow up. He has been followed in the past by Dr. Lia Foyer. Last echo 01/23/14 with no clear HOCM physiology, normal LV function. He was hospitalized several months ago and had a bleeding gastric ulcer. He has been stable since then. He has had no chest pain. He has occasional dyspnea but unchanged.   Primary Care Physician: Norins  Last Lipid Profile:Lipid Panel     Component Value Date/Time   CHOL 112 02/02/2012 1027   TRIG 41.0 02/02/2012 1027   HDL 54.40 02/02/2012 1027   CHOLHDL 2 02/02/2012 1027   VLDL 8.2 02/02/2012 1027   LDLCALC 49 02/02/2012 1027     Past Medical History  Diagnosis Date  . Hypertrophic cardiomyopathy   . HTN (hypertension)   . HLD (hyperlipidemia)   . Alcoholic cirrhosis   . GERD (gastroesophageal reflux disease)   . Renal insufficiency   . Alcoholism   . Alcoholic encephalopathy 11/6107  . Rhabdomyolysis 01/2005  . Colon polyp perhaps in 1990s    pathology/type not known  . Diverticulosis of colon perhaps in 1990s  . Ascites 11/2004    "early" SBP on paracentesis.   . Portal hypertensive gastropathy 2006  . Fracture of wrist 2014 spring vs early summer    refused orthopods suggestion to set the bone.     Past Surgical History  Procedure Laterality Date  . Inguinal hernia repair    . Melanoma excision Left 02/2011    malignant melanoma of thigh  . Basal cell carcinoma excision Left 11/2012    lower eyelid  . Esophagogastroduodenoscopy N/A 08/04/2013    Procedure: ESOPHAGOGASTRODUODENOSCOPY (EGD);  Surgeon: Ladene Artist, MD;  Location: [facility] ENDOSCOPY;  Service: Endoscopy;  Laterality: N/A;    Current Outpatient Prescriptions  Medication Sig Dispense Refill  . acetaminophen (TYLENOL) 325 MG tablet Take 650 mg by mouth every 6 (six) hours as needed for pain.      Marland Kitchen amLODipine (NORVASC) 10 MG tablet TAKE ONE TABLET BY  MOUTH ONCE DAILY  90 tablet  0  . dexlansoprazole (DEXILANT) 60 MG capsule Take 1 capsule (60 mg total) by mouth daily.  30 capsule  5  . furosemide (LASIX) 20 MG tablet TAKE ONE TABLET BY MOUTH ONCE DAILY  30 tablet  5  . HYDROcodone-acetaminophen (NORCO/VICODIN) 5-325 MG per tablet Take 1 tablet by mouth 2 (two) times daily.  60 tablet  0  . metoprolol succinate (TOPROL-XL) 100 MG 24 hr tablet TAKE ONE AND ONE-HALF TABLETS DAILY -TAKE WITH OR IMMEDIATELY FOLLOWING A MEAL  45 tablet  5  . Multiple Vitamin (MULTIVITAMIN WITH MINERALS) TABS Take 1 tablet by mouth daily.      . simvastatin (ZOCOR) 20 MG tablet TAKE ONE TABLET AT BEDTIME  90 tablet  3  . spironolactone (ALDACTONE) 25 MG tablet Take 1 tablet (25 mg total) by mouth daily.  30 tablet  6  . zolpidem (AMBIEN) 10 MG tablet TAKE 1/2 TABLET (5MG  TOTAL) BY MOUTH AT BEDTIME AS NEEDED FOR SLEEP- MAXIMUM DOSAGE IS 10MG  DAILY  30 tablet  2   No current facility-administered medications for this visit.    No Known Allergies  History   Social History  . Marital Status: Married    Spouse Name: N/A    Number of Children: 4  . Years of Education: 24   Occupational History  . pathologist  retired   Social History Main Topics  . Smoking status: Never Smoker   . Smokeless tobacco: Never Used  . Alcohol Use: No     Comment: member of AA  . Drug Use: No  . Sexual Activity: Not on file   Other Topics Concern  . 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  Remains independent. ACP- not discussed, will approach at next OV    Family History  Problem Relation Age of Onset  . Colon cancer Father   . Heart disease Mother     Review of Systems:  As stated in the HPI and otherwise negative.   BP 140/64  Pulse 66  Ht 5\' 8"  (1.727 m)  Wt 167 lb (75.751 kg)  BMI 25.40 kg/m2  Physical Examination: General: Well developed, well nourished, NAD HEENT: OP clear, mucus membranes  moist SKIN: warm, dry. No rashes. Neuro: No focal deficits Musculoskeletal: Muscle strength 5/5 all ext Psychiatric: Mood and affect normal Neck: No JVD, no carotid bruits, no thyromegaly, no lymphadenopathy. Lungs:Clear bilaterally, no wheezes, rhonci, crackles Cardiovascular: Regular rate and rhythm. No murmurs, gallops or rubs. Abdomen:Soft. Bowel sounds present. Non-tender.  Extremities: No lower extremity edema. Pulses are 2 + in the bilateral DP/PT.  Echo 01/23/14: Left ventricle: There is narrowing of the sub aortic area. No SAM or web seen Minimal gradient through tunnel like area formed by basal septal hypertrophy The cavity size was normal. Wall thickness was increased in a pattern of mild LVH. There was mild focal basal hypertrophy of the septum. Systolic function was normal. The estimated ejection fraction was in the range of 60% to 65%. - Aortic valve: Moderately calcified valve with small gradient in systole but no AS Mild regurgitation. Valve area: 2.58cm^2(VTI). Valve area: 2.47cm^2 (Vmax). - Left atrium: The atrium was mildly dilated. - Atrial septum: No defect or patent foramen ovale was Identified.  EKG: Sinus, rate 66 bpm. 1st degree AV block. RBBB  Assessment and Plan:   1. Hypertrophic cardiomyopathy: No symptoms. Echo as above 3/15 with no physiology of HOCM  2. HTN: BP controlled. No changes.   3. Lower ext edema: Baseline. No changes. Continue low dose Lasix  4. Hyperlipidemia: He is on a statin but does not want his lipids checked. LFTS ok 08/10/13.   Extensive record review including old office visits, EKG and echo.

## 2014-02-06 NOTE — Patient Instructions (Signed)
Your physician wants you to follow-up in:  12 months.  You will receive a reminder letter in the mail two months in advance. If you don't receive a letter, please call our office to schedule the follow-up appointment.   

## 2014-02-13 ENCOUNTER — Telehealth: Payer: Self-pay | Admitting: Internal Medicine

## 2014-02-13 MED ORDER — DEXLANSOPRAZOLE 60 MG PO CPDR: 60.0000 mg | DELAYED_RELEASE_CAPSULE | Freq: Every day | ORAL | Status: DC

## 2014-02-13 MED ORDER — SPIRONOLACTONE 25 MG PO TABS: 25.0000 mg | ORAL_TABLET | Freq: Every day | ORAL | Status: DC

## 2014-02-13 NOTE — Telephone Encounter (Signed)
Refills done and patient informed 

## 2014-02-13 NOTE — Telephone Encounter (Signed)
Pt needs refills called into [facility].  He needs Dexilant and Aldactone.  He was Dr. Linda Hedges pt and will be Dr. Gwynn Burly pt.

## 2014-03-01 ENCOUNTER — Other Ambulatory Visit: Payer: Self-pay | Admitting: *Deleted

## 2014-03-01 MED ORDER — METOPROLOL SUCCINATE ER 100 MG PO TB24: ORAL_TABLET | ORAL | Status: DC

## 2014-03-26 ENCOUNTER — Other Ambulatory Visit: Payer: Self-pay | Admitting: *Deleted

## 2014-03-27 MED ORDER — ZOLPIDEM TARTRATE 10 MG PO TABS: ORAL_TABLET | ORAL | Status: DC

## 2014-03-27 NOTE — Telephone Encounter (Signed)
Rx did not print md had it on phone-ion. Reprinted and fax to [facility]...Johny Chess

## 2014-03-27 NOTE — Telephone Encounter (Signed)
Done hardcopy to [facility]  

## 2014-04-21 ENCOUNTER — Other Ambulatory Visit: Payer: Self-pay | Admitting: Cardiovascular Disease

## 2014-04-24 ENCOUNTER — Other Ambulatory Visit: Payer: Self-pay | Admitting: Internal Medicine

## 2014-04-24 NOTE — Telephone Encounter (Signed)
Faxed hard copy to [facility].  

## 2014-04-24 NOTE — Telephone Encounter (Signed)
Done hardcopy to [facility]  

## 2014-05-22 ENCOUNTER — Other Ambulatory Visit: Payer: Self-pay | Admitting: Internal Medicine

## 2014-05-22 NOTE — Telephone Encounter (Signed)
Done hardcopy to [facility]  

## 2014-05-22 NOTE — Telephone Encounter (Signed)
Faxed hardcopy to [facility] [place]  

## 2014-06-14 ENCOUNTER — Encounter: Payer: Self-pay | Admitting: Nurse Practitioner

## 2014-06-14 ENCOUNTER — Ambulatory Visit (INDEPENDENT_AMBULATORY_CARE_PROVIDER_SITE_OTHER): Payer: Medicare Other | Admitting: Nurse Practitioner

## 2014-06-14 VITALS — BP 156/69 | HR 84 | Temp 97.7°F | Ht 68.0 in | Wt 163.8 lb

## 2014-06-14 DIAGNOSIS — Z0000 Encounter for general adult medical examination without abnormal findings: Secondary | ICD-10-CM

## 2014-06-14 DIAGNOSIS — Z23 Encounter for immunization: Secondary | ICD-10-CM

## 2014-06-14 NOTE — Patient Instructions (Signed)
Will give patient advanced directive information Refer to audiologist for detailed hearing exam and treatment if indicated Tetanus injection site may become painful. Apply warm compresses if needed. Advance Directive Advance directives are the legal documents that allow you to make choices about your health care and medical treatment if you cannot speak for yourself. Advance directives are a way for you to communicate your wishes to family, friends, and health care providers. The specified people can then convey your decisions about end-of-life care to avoid confusion if you should become unable to communicate. Ideally, the process of discussing and writing advance directives should happen over time rather than making decisions all at once. Advance directives can be modified as your situation changes, and you can change your mind at any time, even after you have signed the advance directives. Each state has its own laws regarding advance directives. You may want to check with your health care provider, attorney, or state representative about the law in your state. Below are some examples of advance directives. LIVING WILL A living will is a set of instructions documenting your wishes about medical care when you cannot care for yourself. It is used if you become:  Terminally ill.  Incapacitated.  Unable to communicate.  Unable to make decisions. Items to consider in your living will include:  The use or non-use of life-sustaining equipment, such as dialysis machines and breathing machines (ventilators).  A do not resuscitate (DNR) order, which is the instruction not to use cardiopulmonary resuscitation (CPR) if breathing or heartbeat stops.  Tube feeding.  Withholding of food and fluids.  Comfort (palliative) care when the goal becomes comfort rather than a cure.  Organ and tissue donation. A living will does not give instructions about distribution of your money and property if you  should pass away. It is advisable to seek the expert advice of a lawyer in drawing up a will regarding your possessions. Decisions about taxes, beneficiaries, and asset distribution will be legally binding. This process can relieve your family and friends of any burdens surrounding disputes or questions that may come up about the allocation of your assets. DO NOT RESUSCITATE (DNR) A do not resuscitate (DNR) order is a request to not have CPR in the event that your heart stops beating or you stop breathing. Unless given other instructions, a health care provider will try to help any patient whose heart has stopped or who has stopped breathing.  HEALTH CARE PROXY AND DURABLE POWER OF ATTORNEY FOR HEALTH CARE A health care proxy is a person (agent) appointed to make medical decisions for you if you cannot. Generally, people choose someone they know well and trust to represent their preferences when they can no longer do so. You should be sure to ask this person for agreement to act as your agent. An agent may have to exercise judgment in the event of a medical decision for which your wishes are not known. The durable power of attorney for health care is the legal document that names your health care proxy. Once written, it should be:  Signed.  Notarized.  Dated.  Copied.  Witnessed.  Incorporated into your medical record. You may also want to appoint someone to manage your financial affairs if you cannot. This is called a durable power of attorney for finances. It is a separate legal document from the durable power of attorney for health care. You may choose the same person or someone different from your health care proxy to act as  your agent in financial matters. Document Released: 02/02/2008 Document Revised: 10/31/2013 Document Reviewed: 03/15/2013 [facility] Patient Information 2015 [place], [place]. This information is not intended to replace advice given to you by your health care provider. Make  sure you discuss any questions you have with your health care provider.

## 2014-06-14 NOTE — Progress Notes (Signed)
Subjective:    [name] is a 80 y.o. male who presents for Medicare Annual/Subsequent preventive examination.   Preventive Screening-Counseling & Management  Tobacco History  Smoking status  . Never Smoker   Smokeless tobacco  . Never Used    Problems Prior to Visit 1.   Current Problems (verified) Patient Active Problem List   Diagnosis Date Noted  . Aortic stenosis, mild 09/13/2013  . Acute duodenal ulcer with hemorrhage, without mention of obstruction 08/04/2013  . Malignant melanoma of lower extremity or hip, left 03/17/2012  . Routine health maintenance 03/17/2012  . Alcoholic cirrhosis 59/93/5701  . Portal hypertensive gastropathy 05/26/2011  . EDEMA 10/10/2010  . ALCOHOL ABUSE 09/05/2009  . HYPERLIPIDEMIA-MIXED 06/23/2009  . HYPERTENSION, BENIGN 06/23/2009  . Hypertrophic obstructive cardiomyopathy(425.11) 06/23/2009    Medications Prior to Visit Current Outpatient Prescriptions on File Prior to Visit  Medication Sig Dispense Refill  . acetaminophen (TYLENOL) 325 MG tablet Take 650 mg by mouth every 6 (six) hours as needed for pain.      Marland Kitchen amLODipine (NORVASC) 10 MG tablet TAKE ONE TABLET BY MOUTH ONCE DAILY  90 tablet  1  . dexlansoprazole (DEXILANT) 60 MG capsule Take 1 capsule (60 mg total) by mouth daily.  90 capsule  3  . furosemide (LASIX) 20 MG tablet TAKE ONE TABLET BY MOUTH ONCE DAILY  30 tablet  5  . metoprolol succinate (TOPROL-XL) 100 MG 24 hr tablet TAKE ONE AND ONE-HALF TABLETS DAILY -TAKE WITH OR IMMEDIATELY FOLLOWING A MEAL  45 tablet  5  . Multiple Vitamin (MULTIVITAMIN WITH MINERALS) TABS Take 1 tablet by mouth daily.      . simvastatin (ZOCOR) 20 MG tablet TAKE ONE TABLET AT BEDTIME  90 tablet  3  . spironolactone (ALDACTONE) 25 MG tablet Take 1 tablet (25 mg total) by mouth daily.  90 tablet  3  . zolpidem (AMBIEN) 10 MG tablet TAKE ONE-HALF TABLET AT BEDTIME AS NEEDED FOR SLEEP MAXIMUM OF 1 TABLET DAILY  30 tablet  1  .  HYDROcodone-acetaminophen (NORCO/VICODIN) 5-325 MG per tablet Take 1 tablet by mouth 2 (two) times daily.  60 tablet  0   No current facility-administered medications on file prior to visit.    Current Medications (verified) Current Outpatient Prescriptions  Medication Sig Dispense Refill  . acetaminophen (TYLENOL) 325 MG tablet Take 650 mg by mouth every 6 (six) hours as needed for pain.      Marland Kitchen amLODipine (NORVASC) 10 MG tablet TAKE ONE TABLET BY MOUTH ONCE DAILY  90 tablet  1  . dexlansoprazole (DEXILANT) 60 MG capsule Take 1 capsule (60 mg total) by mouth daily.  90 capsule  3  . furosemide (LASIX) 20 MG tablet TAKE ONE TABLET BY MOUTH ONCE DAILY  30 tablet  5  . metoprolol succinate (TOPROL-XL) 100 MG 24 hr tablet TAKE ONE AND ONE-HALF TABLETS DAILY -TAKE WITH OR IMMEDIATELY FOLLOWING A MEAL  45 tablet  5  . Multiple Vitamin (MULTIVITAMIN WITH MINERALS) TABS Take 1 tablet by mouth daily.      . simvastatin (ZOCOR) 20 MG tablet TAKE ONE TABLET AT BEDTIME  90 tablet  3  . spironolactone (ALDACTONE) 25 MG tablet Take 1 tablet (25 mg total) by mouth daily.  90 tablet  3  . zolpidem (AMBIEN) 10 MG tablet TAKE ONE-HALF TABLET AT BEDTIME AS NEEDED FOR SLEEP MAXIMUM OF 1 TABLET DAILY  30 tablet  1  . HYDROcodone-acetaminophen (NORCO/VICODIN) 5-325 MG per tablet Take 1 tablet  by mouth 2 (two) times daily.  60 tablet  0   No current facility-administered medications for this visit.     Allergies (verified) Review of patient's allergies indicates no known allergies.   PAST HISTORY  Family History Family History  Problem Relation Age of Onset  . Cancer Father 25    lung cancer  . Heart disease Mother   . Heart disease Sister     Social History History  Substance Use Topics  . Smoking status: Never Smoker   . Smokeless tobacco: Never Used  . Alcohol Use: No     Comment: member of AA    Are there smokers in your home (other than you)?  Yes  Risk Factors Current exercise habits:  The patient does not participate in regular exercise at present.  Dietary issues discussed: wife cooks and eats out once a day   Cardiac risk factors: advanced age (older than 50 for men, 52 for women), dyslipidemia, family history of premature cardiovascular disease, hypertension, male gender and sedentary lifestyle.  Depression Screen (Note: if answer to either of the following is "Yes", a more complete depression screening is indicated)   Q1: Over the past two weeks, have you felt down, depressed or hopeless? No  Q2: Over the past two weeks, have you felt little interest or pleasure in doing things? No  Have you lost interest or pleasure in daily life? No  Do you often feel hopeless? No  Do you cry easily over simple problems? No  Activities of Daily Living In your present state of health, do you have any difficulty performing the following activities?:  Driving? No Managing money?  No Feeding yourself? No Getting from bed to chair? No Climbing a flight of stairs? No Preparing food and eating?: No Bathing or showering? No Getting dressed: No Getting to the toilet? No Using the toilet:No Moving around from place to place: No In the past year have you fallen or had a near fall?:No   Are you sexually active?  No  Do you have more than one partner?  No  Hearing Difficulties: No Do you often ask people to speak up or repeat themselves? No Do you experience ringing or noises in your ears? No Do you have difficulty understanding soft or whispered voices? Yes   Do you feel that you have a problem with memory? No  Do you often misplace items? No  Do you feel safe at home?  Yes  Cognitive Testing  Alert? Yes  Normal Appearance?Yes  Oriented to person? Yes  Place? Yes   Time? Yes  Recall of three objects?  Yes  Can perform simple calculations? Yes  Displays appropriate judgment?Yes  Can read the correct time from a watch face?Yes   Advanced Directives have been discussed with  the patient? Yes   List the Names of Other Physician/Practitioners you currently use: 1.  Dr. Angelena Form 2.  Dr. Fuller Plan   Indicate any recent Medical Services you may have received from other than [facility] providers in the past year (date may be approximate).  Immunization History  Administered Date(s) Administered  . Influenza Whole 08/23/2008, 08/17/2012  . Zoster 07/13/2008    Screening Tests Health Maintenance  Topic Date Due  . Tetanus/tdap  06/03/1950  . Colonoscopy  06/03/1981  . Pneumococcal Polysaccharide Vaccine Age 56 And Over  06/03/1996  . Influenza Vaccine  06/09/2014  . Zostavax  Completed    All answers were reviewed with the patient and necessary referrals were  made:  Carmelina Paddock, NP   06/14/2014   History reviewed: allergies, current medications, past family history, past medical history, past social history, past surgical history and problem list  Review of Systems Pertinent items are noted in HPI.    Objective:     Vision by Snellen chart: right eye:20/20, left eye:20/20Wears glasses Blood pressure 156/69, pulse 84, temperature 97.7 F (36.5 C), temperature source Oral, height 5\' 8"  (1.727 m), weight 163 lb 12.8 oz (74.299 kg), SpO2 96.00%. Body mass index is 24.91 kg/(m^2).  No exam performed today, annual medicare wellness exam.     Assessment:     Patient presents for yearly preventative medicine examination. Medicare questionnaire was completed  All immunizations and health maintenance protocols were reviewed with the patient and needed orders were placed.  Appropriate screening laboratory values were ordered for the patient including screening of hyperlipidemia, renal function and hepatic function. If indicated by BPH, a PSA was ordered.  Medication reconciliation,  past medical history, social history, problem list and allergies were reviewed in detail with the patient  Goals were established with regard to weight loss, exercise, and   diet in compliance with medications  End of life planning was discussed.       Plan:     During the course of the visit the patient was educated and counseled about appropriate screening and preventive services including:    Pneumococcal vaccine   Td vaccine  Diet review for nutrition referral? Yes ____  Not Indicated ____   Patient Instructions (the written plan) was given to the patient.  Medicare Attestation I have personally reviewed: The patient's medical and social history Their use of alcohol, tobacco or illicit drugs Their current medications and supplements The patient's functional ability including ADLs,fall risks, home safety risks, cognitive, and hearing and visual impairment Diet and physical activities Evidence for depression or mood disorders  The patient's weight, height, BMI, and visual acuity have been recorded in the chart.  I have made referrals, counseling, and provided education to the patient based on review of the above and I have provided the patient with a written personalized care plan for preventive services.     Steva Colder A, NP   06/14/2014

## 2014-06-14 NOTE — Progress Notes (Signed)
Pre visit review using our clinic review tool, if applicable. No additional management support is needed unless otherwise documented below in the visit note. 

## 2014-07-13 ENCOUNTER — Other Ambulatory Visit: Payer: Self-pay | Admitting: Internal Medicine

## 2014-07-13 NOTE — Telephone Encounter (Signed)
Faxed hardcopy for Zolpidem to [place]

## 2014-07-13 NOTE — Telephone Encounter (Signed)
Done hardcopy to [facility]  

## 2014-07-23 ENCOUNTER — Other Ambulatory Visit: Payer: Self-pay | Admitting: Internal Medicine

## 2014-07-26 ENCOUNTER — Telehealth: Payer: Self-pay | Admitting: Internal Medicine

## 2014-07-26 NOTE — Telephone Encounter (Signed)
Need clarification - ? Phone number, and why? Is this about a patient?

## 2014-07-26 NOTE — Telephone Encounter (Signed)
Dr. Vernard Gambles is requesting Dr. [name] to give him a call back.

## 2014-07-26 NOTE — Telephone Encounter (Signed)
Called left detailed message of MD request and to call back

## 2014-07-27 NOTE — Telephone Encounter (Signed)
Patient stated he is having back problems and would prefer to schedule with Dr. [name] Appt. Was schedule for Tuesday 07/31/14.

## 2014-07-31 ENCOUNTER — Ambulatory Visit: Payer: Self-pay | Admitting: Internal Medicine

## 2014-08-20 ENCOUNTER — Other Ambulatory Visit: Payer: Self-pay | Admitting: Internal Medicine

## 2014-08-23 ENCOUNTER — Encounter: Payer: Self-pay | Admitting: Internal Medicine

## 2014-08-23 ENCOUNTER — Other Ambulatory Visit (INDEPENDENT_AMBULATORY_CARE_PROVIDER_SITE_OTHER): Payer: Medicare Other

## 2014-08-23 ENCOUNTER — Ambulatory Visit (INDEPENDENT_AMBULATORY_CARE_PROVIDER_SITE_OTHER): Payer: Medicare Other | Admitting: Internal Medicine

## 2014-08-23 VITALS — BP 140/72 | HR 72 | Temp 97.8°F | Wt 158.0 lb

## 2014-08-23 DIAGNOSIS — R310 Gross hematuria: Secondary | ICD-10-CM

## 2014-08-23 DIAGNOSIS — E785 Hyperlipidemia, unspecified: Secondary | ICD-10-CM

## 2014-08-23 DIAGNOSIS — Z23 Encounter for immunization: Secondary | ICD-10-CM

## 2014-08-23 DIAGNOSIS — I10 Essential (primary) hypertension: Secondary | ICD-10-CM

## 2014-08-23 LAB — LIPID PANEL
CHOLESTEROL: 107 mg/dL (ref 0–200)
HDL: 30.9 mg/dL — ABNORMAL LOW (ref >39.00)
LDL Cholesterol: 57 mg/dL (ref 0–99)
NONHDL: 76.1
TRIGLYCERIDES: 97 mg/dL (ref 0.0–149.0)
Total CHOL/HDL Ratio: 3
VLDL: 19.4 mg/dL (ref 0.0–40.0)

## 2014-08-23 LAB — URINALYSIS, ROUTINE W REFLEX MICROSCOPIC
BILIRUBIN URINE: NEGATIVE
KETONES UR: NEGATIVE
Nitrite: NEGATIVE
Specific Gravity, Urine: 1.02 (ref 1.000–1.030)
Total Protein, Urine: 100 — AB
URINE GLUCOSE: NEGATIVE
UROBILINOGEN UA: 0.2 (ref 0.0–1.0)
pH: 5.5 (ref 5.0–8.0)

## 2014-08-23 LAB — CBC WITH DIFFERENTIAL/PLATELET
BASOS ABS: 0.1 10*3/uL (ref 0.0–0.1)
Basophils Relative: 0.6 % (ref 0.0–3.0)
EOS ABS: 0.6 10*3/uL (ref 0.0–0.7)
Eosinophils Relative: 5.5 % — ABNORMAL HIGH (ref 0.0–5.0)
HCT: 37.8 % — ABNORMAL LOW (ref 39.0–52.0)
HEMOGLOBIN: 12.6 g/dL — AB (ref 13.0–17.0)
LYMPHS PCT: 11.2 % — AB (ref 12.0–46.0)
Lymphs Abs: 1.2 10*3/uL (ref 0.7–4.0)
MCHC: 33.4 g/dL (ref 30.0–36.0)
MCV: 97.8 fl (ref 78.0–100.0)
Monocytes Absolute: 0.9 10*3/uL (ref 0.1–1.0)
Monocytes Relative: 9 % (ref 3.0–12.0)
Neutro Abs: 7.6 10*3/uL (ref 1.4–7.7)
Neutrophils Relative %: 73.7 % (ref 43.0–77.0)
Platelets: 247 10*3/uL (ref 150.0–400.0)
RBC: 3.87 Mil/uL — ABNORMAL LOW (ref 4.22–5.81)
RDW: 14.5 % (ref 11.5–15.5)
WBC: 10.4 10*3/uL (ref 4.0–10.5)

## 2014-08-23 LAB — BASIC METABOLIC PANEL
BUN: 31 mg/dL — ABNORMAL HIGH (ref 6–23)
CHLORIDE: 108 meq/L (ref 96–112)
CO2: 22 meq/L (ref 19–32)
Calcium: 9 mg/dL (ref 8.4–10.5)
Creatinine, Ser: 1.9 mg/dL — ABNORMAL HIGH (ref 0.4–1.5)
GFR: 36.81 mL/min — ABNORMAL LOW (ref >60.00)
Glucose, Bld: 109 mg/dL — ABNORMAL HIGH (ref 70–99)
Potassium: 5.3 mEq/L — ABNORMAL HIGH (ref 3.5–5.1)
Sodium: 137 mEq/L (ref 135–145)

## 2014-08-23 LAB — HEPATIC FUNCTION PANEL
ALK PHOS: 81 U/L (ref 39–117)
ALT: 27 U/L (ref 0–53)
AST: 28 U/L (ref 0–37)
Albumin: 3.5 g/dL (ref 3.5–5.2)
BILIRUBIN DIRECT: 0.1 mg/dL (ref 0.0–0.3)
TOTAL PROTEIN: 7.8 g/dL (ref 6.0–8.3)
Total Bilirubin: 0.5 mg/dL (ref 0.2–1.2)

## 2014-08-23 LAB — TSH: TSH: 0.52 u[IU]/mL (ref 0.35–4.50)

## 2014-08-23 NOTE — Assessment & Plan Note (Signed)
By hx seems c/w prob passed renal stone, but with gross hematuria should have further evaluation to r/o other, as no observed stone was noted at the time.  For UA, also refer urology

## 2014-08-23 NOTE — Progress Notes (Signed)
Pre visit review using our clinic review tool, if applicable. No additional management support is needed unless otherwise documented below in the visit note. 

## 2014-08-23 NOTE — Assessment & Plan Note (Signed)
stable overall by history and exam, recent data reviewed with pt, and pt to continue medical treatment as before,  to f/u any worsening symptoms or concerns Lab Results  Component Value Date   LDLCALC 49 02/02/2012

## 2014-08-23 NOTE — Progress Notes (Signed)
Subjective:    Patient ID: [name], male    DOB: [birth year], 80 y.o.   MRN: [id number]  HPI Here to f/u after last seen per NP with wellness.  To establish with new PCP, as Dr Linda Hedges has retired. Pt denies chest pain, increased sob or doe, wheezing, orthopnea, PND, increased LE swelling, palpitations, dizziness or syncope.  Pt denies new neurological symptoms such as new headache, or facial or extremity weakness or numbness  Walks with cane due to mildly unsteady gait. No recent falls.  Does an episode 2 mo ago of right flank pain, severe at times for a couple of days, assoc with BRB in urine, then all seemed to resolve. No fever, n/v or other abd pain.  Did not notice any obvious passed stone.  No further symptoms, would like to see Dr Tannenbaum/urology if needed.  Denies urinary symptoms such as dysuria, frequency, urgency, flank pain, hematuria or n/v, fever, chills.   Pt denies fever, wt loss, night sweats, loss of appetite, or other constitutional symptoms. No ETOH for many years, goes to AA weekly.  Denies worsening depressive symptoms, suicidal ideation, or panic Past Medical History  Diagnosis Date  . Hypertrophic cardiomyopathy   . HTN (hypertension)   . HLD (hyperlipidemia)   . Alcoholic cirrhosis   . GERD (gastroesophageal reflux disease)   . Renal insufficiency   . Alcoholism   . Alcoholic encephalopathy 04/108  . Rhabdomyolysis 01/2005  . Colon polyp perhaps in 1990s    pathology/type not known  . Diverticulosis of colon perhaps in 1990s  . Ascites 11/2004    "early" SBP on paracentesis.   . Portal hypertensive gastropathy 2006  . Fracture of wrist 2014 spring vs early summer    refused orthopods suggestion to set the bone.    Past Surgical History  Procedure Laterality Date  . Inguinal hernia repair    . Melanoma excision Left 02/2011    malignant melanoma of thigh  . Basal cell carcinoma excision Left 11/2012    lower eyelid  . Esophagogastroduodenoscopy N/A  08/04/2013    Procedure: ESOPHAGOGASTRODUODENOSCOPY (EGD);  Surgeon: Ladene Artist, MD;  Location[facility] ENDOSCOPY;  Service: Endoscopy;  Laterality: N/A;    reports that he has never smoked. He has never used smokeless tobacco. He reports that he does not drink alcohol or use illicit drugs. family history includes Cancer (age of onset: 20) in his father; Heart disease in his mother and sister. No Known Allergies Current Outpatient Prescriptions on File Prior to Visit  Medication Sig Dispense Refill  . acetaminophen (TYLENOL) 325 MG tablet Take 650 mg by mouth every 6 (six) hours as needed for pain.      Marland Kitchen amLODipine (NORVASC) 10 MG tablet TAKE ONE TABLET BY MOUTH ONCE DAILY  90 tablet  1  . dexlansoprazole (DEXILANT) 60 MG capsule Take 1 capsule (60 mg total) by mouth daily.  90 capsule  3  . furosemide (LASIX) 20 MG tablet TAKE ONE TABLET EACH DAY  30 tablet  5  . HYDROcodone-acetaminophen (NORCO/VICODIN) 5-325 MG per tablet Take 1 tablet by mouth 2 (two) times daily.  60 tablet  0  . metoprolol succinate (TOPROL-XL) 100 MG 24 hr tablet TAKE ONE AND ONE-HALF TABLETS DAILY -TAKE WITH OR IMMEDIATELY FOLLOWING A MEAL  45 tablet  3  . Multiple Vitamin (MULTIVITAMIN WITH MINERALS) TABS Take 1 tablet by mouth daily.      . simvastatin (ZOCOR) 20 MG tablet TAKE ONE TABLET AT  BEDTIME  90 tablet  3  . spironolactone (ALDACTONE) 25 MG tablet Take 1 tablet (25 mg total) by mouth daily.  90 tablet  3  . zolpidem (AMBIEN) 10 MG tablet TAKE ONE-HALF TABLET AT BEDTIME AS NEEDED FOR SLEEP MAXIMUM OF ONE TABLET DAILY  30 tablet  1   No current facility-administered medications on file prior to visit.    Review of Systems  Constitutional: Negative for unusual diaphoresis or other sweats  HENT: Negative for ringing in ear Eyes: Negative for double vision or worsening visual disturbance.  Respiratory: Negative for choking and stridor.   Gastrointestinal: Negative for vomiting or other signifcant bowel  change Genitourinary: Negative for hematuria or decreased urine volume.  Musculoskeletal: Negative for other MSK pain or swelling Skin: Negative for color change and worsening wound.  Neurological: Negative for tremors and numbness other than noted  Psychiatric/Behavioral: Negative for decreased concentration or agitation other than above       Objective:   Physical Exam BP 140/72  Pulse 72  Temp(Src) 97.8 F (36.6 C) (Oral)  Wt 158 lb (71.668 kg)  SpO2 94% VS noted,  Constitutional: Pt appears well-developed, well-nourished.  HENT: Head: NCAT.  Right Ear: External ear normal.  Left Ear: External ear normal.  Eyes: . Pupils are equal, round, and reactive to light. Conjunctivae and EOM are normal Neck: Normal range of motion. Neck supple.  Cardiovascular: Normal rate and regular rhythm.   Pulmonary/Chest: Effort normal and breath sounds normal.  Abd:  Soft, NT, ND, + BS Neurological: Pt is alert. Not confused , motor grossly intact, gait unsteady Skin: Skin is warm. No rash Psychiatric: Pt behavior is normal. No agitation.     Assessment & Plan:

## 2014-08-23 NOTE — Assessment & Plan Note (Signed)
stable overall by history and exam, recent data reviewed with pt, and pt to continue medical treatment as before,  to f/u any worsening symptoms or concerns BP Readings from Last 3 Encounters:  08/23/14 140/72  06/14/14 156/69  02/06/14 140/64

## 2014-08-23 NOTE — Patient Instructions (Signed)
Please continue all other medications as before, and refills have been done if requested.  Please have the pharmacy call with any other refills you may need.  Please continue your efforts at being more active, low cholesterol diet, and weight control.  You are otherwise up to date with prevention measures today.  Please keep your appointments with your specialists as you may have planned  You will be contacted regarding the referral for: Dr Gaynelle Arabian /urology  Please go to the LAB in the Basement (turn left off the elevator) for the tests to be done today  You will be contacted by phone if any changes need to be made immediately.  Otherwise, you will receive a letter about your results with an explanation, but please check with MyChart first.  Please remember to sign up for MyChart if you have not done so, as this will be important to you in the future with finding out test results, communicating by private email, and scheduling acute appointments online when needed.  Please return in 6 months, or sooner if needed

## 2014-09-11 ENCOUNTER — Other Ambulatory Visit: Payer: Self-pay | Admitting: Internal Medicine

## 2014-09-11 NOTE — Telephone Encounter (Signed)
Faxed hardcopy for Zolpidem to [facility]

## 2014-09-11 NOTE — Telephone Encounter (Signed)
Done hardcopy to [facility]  

## 2014-10-10 ENCOUNTER — Telehealth: Payer: Self-pay | Admitting: Internal Medicine

## 2014-10-10 NOTE — Telephone Encounter (Signed)
Very sorry, I dont do phone consultations, but if there is a specific question, I might be able to answer, then I would do that. thanks

## 2014-10-10 NOTE — Telephone Encounter (Signed)
Patient would like a call back from Dr. [name] in regards to Cardiac issues.

## 2014-10-10 NOTE — Telephone Encounter (Signed)
Patient informed of PCP instructions and stated he would call back at a later time.

## 2014-10-23 ENCOUNTER — Other Ambulatory Visit: Payer: Self-pay | Admitting: Cardiovascular Disease

## 2014-10-31 ENCOUNTER — Other Ambulatory Visit: Payer: Self-pay | Admitting: Internal Medicine

## 2014-11-16 ENCOUNTER — Telehealth: Payer: Self-pay

## 2014-11-16 NOTE — Telephone Encounter (Signed)
PA  For Zolpidem was done.  Received letter stating that zolpidem was on the patients plan and no PA necessary and patient can get at the pharmacy.. Pharmacy and patient informed.

## 2014-11-22 DIAGNOSIS — L570 Actinic keratosis: Secondary | ICD-10-CM | POA: Diagnosis not present

## 2014-11-22 DIAGNOSIS — L578 Other skin changes due to chronic exposure to nonionizing radiation: Secondary | ICD-10-CM | POA: Diagnosis not present

## 2014-11-22 DIAGNOSIS — L821 Other seborrheic keratosis: Secondary | ICD-10-CM | POA: Diagnosis not present

## 2014-11-22 DIAGNOSIS — Z85828 Personal history of other malignant neoplasm of skin: Secondary | ICD-10-CM | POA: Diagnosis not present

## 2014-11-22 DIAGNOSIS — Z85820 Personal history of malignant melanoma of skin: Secondary | ICD-10-CM | POA: Diagnosis not present

## 2014-12-18 ENCOUNTER — Other Ambulatory Visit: Payer: Self-pay | Admitting: Internal Medicine

## 2015-01-18 ENCOUNTER — Encounter: Payer: Self-pay | Admitting: Cardiovascular Disease

## 2015-01-18 ENCOUNTER — Telehealth: Payer: Self-pay | Admitting: Cardiovascular Disease

## 2015-01-18 ENCOUNTER — Ambulatory Visit (INDEPENDENT_AMBULATORY_CARE_PROVIDER_SITE_OTHER): Payer: Medicare Other | Admitting: Cardiovascular Disease

## 2015-01-18 ENCOUNTER — Telehealth: Payer: Self-pay | Admitting: *Deleted

## 2015-01-18 ENCOUNTER — Other Ambulatory Visit: Payer: Self-pay | Admitting: Internal Medicine

## 2015-01-18 VITALS — BP 116/64 | HR 74 | Ht 68.0 in | Wt 158.0 lb

## 2015-01-18 DIAGNOSIS — E875 Hyperkalemia: Secondary | ICD-10-CM

## 2015-01-18 DIAGNOSIS — I10 Essential (primary) hypertension: Secondary | ICD-10-CM | POA: Diagnosis not present

## 2015-01-18 DIAGNOSIS — E785 Hyperlipidemia, unspecified: Secondary | ICD-10-CM

## 2015-01-18 DIAGNOSIS — R600 Localized edema: Secondary | ICD-10-CM

## 2015-01-18 DIAGNOSIS — I421 Obstructive hypertrophic cardiomyopathy: Secondary | ICD-10-CM

## 2015-01-18 LAB — BASIC METABOLIC PANEL
BUN: 40 mg/dL — ABNORMAL HIGH (ref 6–23)
CO2: 23 meq/L (ref 19–32)
Calcium: 8.9 mg/dL (ref 8.4–10.5)
Chloride: 110 mEq/L (ref 96–112)
Creatinine, Ser: 1.98 mg/dL — ABNORMAL HIGH (ref 0.40–1.50)
GFR: 34.43 mL/min — ABNORMAL LOW (ref >60.00)
Glucose, Bld: 94 mg/dL (ref 70–99)
Potassium: 5.7 mEq/L — ABNORMAL HIGH (ref 3.5–5.1)
SODIUM: 137 meq/L (ref 135–145)

## 2015-01-18 LAB — HEPATIC FUNCTION PANEL
ALBUMIN: 3.7 g/dL (ref 3.5–5.2)
ALT: 14 U/L (ref 0–53)
AST: 20 U/L (ref 0–37)
Alkaline Phosphatase: 98 U/L (ref 39–117)
BILIRUBIN TOTAL: 0.4 mg/dL (ref 0.2–1.2)
Bilirubin, Direct: 0.1 mg/dL (ref 0.0–0.3)
TOTAL PROTEIN: 6.9 g/dL (ref 6.0–8.3)

## 2015-01-18 MED ORDER — FUROSEMIDE 40 MG PO TABS: 40.0000 mg | ORAL_TABLET | Freq: Every day | ORAL | Status: DC

## 2015-01-18 NOTE — Telephone Encounter (Signed)
Lab results reviewed with Dr. Radford Pax and orders given for pt to stop aldactone and have BMP done tomorrow.  Results of BMP to be called to on call physician (Dr. Domenic Polite).  I spoke with pt and reviewed lab results and gave him instructions to stop aldactone.  He will go to [facility] at [address] tomorrow for lab work.  Pt made aware lab is open form 8-12 on Saturday. Lab order faxed to [facility]. I spoke with Randell Loop and confirmed they have lab work and are aware results need to be called to on call provider.

## 2015-01-18 NOTE — Patient Instructions (Signed)
Your physician wants you to follow-up in:  12 months.You will receive a reminder letter in the mail two months in advance. If you don't receive a letter, please call our office to schedule the follow-up appointment.  Your physician has recommended you make the following change in your medication: Increase furosemide to 40 mg by mouth daily.   Wear compression stockings.

## 2015-01-18 NOTE — Telephone Encounter (Signed)
Pt calling to confirm that he was told to stop aldactone earlier today. Pt advised he was to stop aldactone and repeat lab 01/19/15 --see phone note from earlier today.

## 2015-01-18 NOTE — Progress Notes (Signed)
CC: LE edema  History of Present Illness: 81 yo male with history of hypertrophic cardiomyopathy, HTN, HLD, alcohol abuse here today for cardiac follow up. He has been followed in the past by Dr. Lia Foyer. Last echo 01/23/14 with no clear HOCM physiology, normal LV function.   He is here today for cardiac follow up. He has had no chest pain. He has occasional dyspnea but unchanged. Still with LE edema. Taking lasix 20 mg daily.   Primary Care Physician: [name]  Last Lipid Profile:Lipid Panel     Component Value Date/Time   CHOL 107 08/23/2014 1700   TRIG 97.0 08/23/2014 1700   HDL 30.90* 08/23/2014 1700   CHOLHDL 3 08/23/2014 1700   VLDL 19.4 08/23/2014 1700   LDLCALC 57 08/23/2014 1700     Past Medical History  Diagnosis Date  . Hypertrophic cardiomyopathy   . HTN (hypertension)   . HLD (hyperlipidemia)   . Alcoholic cirrhosis   . GERD (gastroesophageal reflux disease)   . Renal insufficiency   . Alcoholism   . Alcoholic encephalopathy 05/7411  . Rhabdomyolysis 01/2005  . Colon polyp perhaps in 1990s    pathology/type not known  . Diverticulosis of colon perhaps in 1990s  . Ascites 11/2004    "early" SBP on paracentesis.   . Portal hypertensive gastropathy 2006  . Fracture of wrist 2014 spring vs early summer    refused orthopods suggestion to set the bone.     Past Surgical History  Procedure Laterality Date  . Inguinal hernia repair    . Melanoma excision Left 02/2011    malignant melanoma of thigh  . Basal cell carcinoma excision Left 11/2012    lower eyelid  . Esophagogastroduodenoscopy N/A 08/04/2013    Procedure: ESOPHAGOGASTRODUODENOSCOPY (EGD);  Surgeon: Ladene Artist, MD;  Location: [facility] ENDOSCOPY;  Service: Endoscopy;  Laterality: N/A;    Current Outpatient Prescriptions  Medication Sig Dispense Refill  . acetaminophen (TYLENOL) 325 MG tablet Take 650 mg by mouth every 6 (six) hours as needed for pain.    Marland Kitchen amLODipine (NORVASC) 10 MG tablet TAKE ONE TABLET  BY MOUTH ONCE DAILY 90 tablet 0  . dexlansoprazole (DEXILANT) 60 MG capsule Take 1 capsule (60 mg total) by mouth daily. 90 capsule 3  . furosemide (LASIX) 20 MG tablet TAKE ONE TABLET EACH DAY 30 tablet 5  . HYDROcodone-acetaminophen (NORCO/VICODIN) 5-325 MG per tablet Take 1 tablet by mouth 2 (two) times daily. 60 tablet 0  . metoprolol succinate (TOPROL-XL) 100 MG 24 hr tablet TAKE 1 & 1/2 TABLETS DAILY- TAKE WITH ORIMMEDIATELY FOLLOWING A MEAL 45 tablet 5  . Multiple Vitamin (MULTIVITAMIN WITH MINERALS) TABS Take 1 tablet by mouth daily.    . simvastatin (ZOCOR) 20 MG tablet Take 20 mg by mouth daily at 6 PM.    . spironolactone (ALDACTONE) 25 MG tablet Take 1 tablet (25 mg total) by mouth daily. 90 tablet 3  . zolpidem (AMBIEN) 10 MG tablet Take 10 mg by mouth at bedtime as needed for sleep.     No current facility-administered medications for this visit.    No Known Allergies  History   Social History  . Marital Status: Married    Spouse Name: N/A  . Number of Children: 4  . Years of Education: 24   Occupational History  . pathologist     retired   Social History Main Topics  . Smoking status: Never Smoker   . Smokeless tobacco: Never Used  . Alcohol Use:  No     Comment: member of AA  . Drug Use: No  . Sexual Activity: Not on file   Other Topics Concern  . 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  Remains independent. ACP- not discussed, will approach at next OV    Family History  Problem Relation Age of Onset  . Cancer Father 45    lung cancer  . Heart disease Mother   . Heart disease Sister     Review of Systems:  As stated in the HPI and otherwise negative.   BP 116/64 mmHg  Pulse 74  Ht 5\' 8"  (1.727 m)  Wt 158 lb (71.668 kg)  BMI 24.03 kg/m2  Physical Examination: General: Well developed, well nourished, NAD HEENT: OP clear, mucus membranes moist SKIN: warm, dry. No rashes. Neuro: No focal  deficits Musculoskeletal: Muscle strength 5/5 all ext Psychiatric: Mood and affect normal Neck: No JVD, no carotid bruits, no thyromegaly, no lymphadenopathy. Lungs: Basilar crackles bilaterally. No wheezes, rhonci, crackles Cardiovascular: Regular rate and rhythm. No murmurs, gallops or rubs. Abdomen:Soft. Bowel sounds present. Non-tender.  Extremities: 2+ bilateral lower extremity edema. Pulses are 2 + in the bilateral DP/PT.  Echo 01/23/14: Left ventricle: There is narrowing of the sub aortic area. No SAM or web seen Minimal gradient through tunnel like area formed by basal septal hypertrophy The cavity size was normal. Wall thickness was increased in a pattern of mild LVH. There was mild focal basal hypertrophy of the septum. Systolic function was normal. The estimated ejection fraction was in the range of 60% to 65%. - Aortic valve: Moderately calcified valve with small gradient in systole but no AS Mild regurgitation. Valve area: 2.58cm^2(VTI). Valve area: 2.47cm^2 (Vmax). - Left atrium: The atrium was mildly dilated. - Atrial septum: No defect or patent foramen ovale was Identified.  EKG: NSR with 1st degree AV block. RBBB  Assessment and Plan:   1. Hypertrophic cardiomyopathy: No symptoms. Echo as above 3/15 with no physiology of HOCM  2. HTN: BP controlled. No changes.   3. Lower ext edema, chronic: Likely dependent edema. Still with 2+ edema. Will increase Lasix to 40 mg daily. Elevate legs during day. Compression stocking. Check BMET today.   4. Hyperlipidemia: He is on a statin but does not want his lipids checked. Check LFTs today.

## 2015-01-18 NOTE — Telephone Encounter (Signed)
New Message  Pt wanted to speak w/ Rn. Please call back and discuss.

## 2015-01-21 ENCOUNTER — Telehealth: Payer: Self-pay | Admitting: Internal Medicine

## 2015-01-21 ENCOUNTER — Other Ambulatory Visit (INDEPENDENT_AMBULATORY_CARE_PROVIDER_SITE_OTHER): Payer: Medicare Other | Admitting: *Deleted

## 2015-01-21 DIAGNOSIS — I421 Obstructive hypertrophic cardiomyopathy: Secondary | ICD-10-CM

## 2015-01-21 DIAGNOSIS — E875 Hyperkalemia: Secondary | ICD-10-CM | POA: Diagnosis not present

## 2015-01-21 LAB — BASIC METABOLIC PANEL
BUN: 39 mg/dL — AB (ref 6–23)
CHLORIDE: 110 meq/L (ref 96–112)
CO2: 24 meq/L (ref 19–32)
Calcium: 9 mg/dL (ref 8.4–10.5)
Creatinine, Ser: 1.82 mg/dL — ABNORMAL HIGH (ref 0.40–1.50)
GFR: 37.94 mL/min — AB (ref >60.00)
Glucose, Bld: 124 mg/dL — ABNORMAL HIGH (ref 70–99)
POTASSIUM: 4.8 meq/L (ref 3.5–5.1)
SODIUM: 138 meq/L (ref 135–145)

## 2015-01-21 NOTE — Telephone Encounter (Signed)
Results not in computer.  I called [facility] and pt did not come in for lab work.  I spoke with pt who confirmed he did not have labs done.  He will come by our office today for BMP.

## 2015-01-21 NOTE — Telephone Encounter (Signed)
emmi mailed  °

## 2015-01-21 NOTE — Addendum Note (Signed)
Addended by: Thompson Grayer on: 01/21/2015 08:06 AM   Modules accepted: Orders

## 2015-01-26 ENCOUNTER — Other Ambulatory Visit: Payer: Self-pay | Admitting: Cardiovascular Disease

## 2015-02-08 ENCOUNTER — Ambulatory Visit: Payer: Self-pay | Admitting: Cardiovascular Disease

## 2015-02-11 ENCOUNTER — Other Ambulatory Visit: Payer: Self-pay | Admitting: Internal Medicine

## 2015-02-14 ENCOUNTER — Other Ambulatory Visit: Payer: Self-pay | Admitting: Internal Medicine

## 2015-02-14 MED ORDER — ZOLPIDEM TARTRATE 10 MG PO TABS: 10.0000 mg | ORAL_TABLET | Freq: Every evening | ORAL | Status: DC | PRN

## 2015-02-14 NOTE — Telephone Encounter (Signed)
Done hardcopy to Cherina  

## 2015-02-14 NOTE — Telephone Encounter (Signed)
Faxed script back to [facility].../lmb 

## 2015-02-22 ENCOUNTER — Other Ambulatory Visit (INDEPENDENT_AMBULATORY_CARE_PROVIDER_SITE_OTHER): Payer: Medicare Other

## 2015-02-22 ENCOUNTER — Ambulatory Visit (INDEPENDENT_AMBULATORY_CARE_PROVIDER_SITE_OTHER): Payer: Medicare Other | Admitting: Internal Medicine

## 2015-02-22 ENCOUNTER — Encounter: Payer: Self-pay | Admitting: Internal Medicine

## 2015-02-22 ENCOUNTER — Telehealth: Payer: Self-pay | Admitting: Internal Medicine

## 2015-02-22 ENCOUNTER — Other Ambulatory Visit: Payer: Self-pay | Admitting: Internal Medicine

## 2015-02-22 VITALS — BP 116/64 | HR 73 | Temp 97.8°F | Resp 18

## 2015-02-22 DIAGNOSIS — E785 Hyperlipidemia, unspecified: Secondary | ICD-10-CM

## 2015-02-22 DIAGNOSIS — R739 Hyperglycemia, unspecified: Secondary | ICD-10-CM

## 2015-02-22 DIAGNOSIS — Z0000 Encounter for general adult medical examination without abnormal findings: Secondary | ICD-10-CM

## 2015-02-22 DIAGNOSIS — I10 Essential (primary) hypertension: Secondary | ICD-10-CM

## 2015-02-22 LAB — URINALYSIS, ROUTINE W REFLEX MICROSCOPIC
Bilirubin Urine: NEGATIVE
Ketones, ur: NEGATIVE
Nitrite: NEGATIVE
Specific Gravity, Urine: 1.02 (ref 1.000–1.030)
TOTAL PROTEIN, URINE-UPE24: 100 — AB
URINE GLUCOSE: NEGATIVE
UROBILINOGEN UA: 0.2 (ref 0.0–1.0)
pH: 5.5 (ref 5.0–8.0)

## 2015-02-22 LAB — HEPATIC FUNCTION PANEL
ALK PHOS: 106 U/L (ref 39–117)
ALT: 20 U/L (ref 0–53)
AST: 21 U/L (ref 0–37)
Albumin: 3.9 g/dL (ref 3.5–5.2)
Bilirubin, Direct: 0.1 mg/dL (ref 0.0–0.3)
Total Bilirubin: 0.4 mg/dL (ref 0.2–1.2)
Total Protein: 7.6 g/dL (ref 6.0–8.3)

## 2015-02-22 LAB — CBC WITH DIFFERENTIAL/PLATELET
Basophils Absolute: 0 10*3/uL (ref 0.0–0.1)
Basophils Relative: 0.4 % (ref 0.0–3.0)
EOS PCT: 3.4 % (ref 0.0–5.0)
Eosinophils Absolute: 0.4 10*3/uL (ref 0.0–0.7)
HCT: 38 % — ABNORMAL LOW (ref 39.0–52.0)
Hemoglobin: 13 g/dL (ref 13.0–17.0)
Lymphocytes Relative: 7.9 % — ABNORMAL LOW (ref 12.0–46.0)
Lymphs Abs: 0.9 10*3/uL (ref 0.7–4.0)
MCHC: 34.2 g/dL (ref 30.0–36.0)
MCV: 95.2 fl (ref 78.0–100.0)
Monocytes Absolute: 0.9 10*3/uL (ref 0.1–1.0)
Monocytes Relative: 7.7 % (ref 3.0–12.0)
Neutro Abs: 9 10*3/uL — ABNORMAL HIGH (ref 1.4–7.7)
Neutrophils Relative %: 80.6 % — ABNORMAL HIGH (ref 43.0–77.0)
Platelets: 257 10*3/uL (ref 150.0–400.0)
RBC: 3.99 Mil/uL — ABNORMAL LOW (ref 4.22–5.81)
RDW: 14.1 % (ref 11.5–15.5)
WBC: 11.2 10*3/uL — AB (ref 4.0–10.5)

## 2015-02-22 LAB — LIPID PANEL
Cholesterol: 106 mg/dL (ref 0–200)
HDL: 45.6 mg/dL (ref >39.00)
LDL CALC: 33 mg/dL (ref 0–99)
NONHDL: 60.4
Total CHOL/HDL Ratio: 2
Triglycerides: 138 mg/dL (ref 0.0–149.0)
VLDL: 27.6 mg/dL (ref 0.0–40.0)

## 2015-02-22 LAB — BASIC METABOLIC PANEL
BUN: 47 mg/dL — ABNORMAL HIGH (ref 6–23)
CHLORIDE: 108 meq/L (ref 96–112)
CO2: 23 mEq/L (ref 19–32)
CREATININE: 1.86 mg/dL — AB (ref 0.40–1.50)
Calcium: 9.3 mg/dL (ref 8.4–10.5)
GFR: 37 mL/min — ABNORMAL LOW (ref >60.00)
GLUCOSE: 148 mg/dL — AB (ref 70–99)
POTASSIUM: 5.2 meq/L — AB (ref 3.5–5.1)
Sodium: 138 mEq/L (ref 135–145)

## 2015-02-22 LAB — HEMOGLOBIN A1C: Hgb A1c MFr Bld: 5.7 % (ref 4.6–6.5)

## 2015-02-22 LAB — TSH: TSH: 0.88 u[IU]/mL (ref 0.35–4.50)

## 2015-02-22 MED ORDER — CIPROFLOXACIN HCL 500 MG PO TABS: 500.0000 mg | ORAL_TABLET | Freq: Two times a day (BID) | ORAL | Status: DC

## 2015-02-22 NOTE — Progress Notes (Signed)
Subjective:    Patient ID: [name], male    DOB: Sep 19, 1929, 81 y.o.   MRN: [id number]  HPI  Here for wellness and f/u;  Overall doing ok;  Pt denies Chest pain, worsening SOB, DOE, wheezing, orthopnea, PND, worsening LE edema, palpitations, dizziness or syncope.  Pt denies neurological change such as new headache, facial or extremity weakness.  Pt denies polydipsia, polyuria, or low sugar symptoms. Pt states overall good compliance with treatment and medications, good tolerability, and has been trying to follow appropriate diet.  Pt denies worsening depressive symptoms, suicidal ideation or panic. No fever, night sweats, wt loss, loss of appetite, or other constitutional symptoms.  Pt states good ability with ADL's, has low fall risk, home safety reviewed and adequate, no other significant changes in hearing or vision, and only occasionally active with exercise. No specific complaints Wt Readings from Last 3 Encounters:  01/18/15 158 lb (71.668 kg)  08/23/14 158 lb (71.668 kg)  06/14/14 163 lb 12.8 oz (74.299 kg)   Past Medical History  Diagnosis Date  . Hypertrophic cardiomyopathy   . HTN (hypertension)   . HLD (hyperlipidemia)   . Alcoholic cirrhosis   . GERD (gastroesophageal reflux disease)   . Renal insufficiency   . Alcoholism   . Alcoholic encephalopathy 11/4429  . Rhabdomyolysis 01/2005  . Colon polyp perhaps in 1990s    pathology/type not known  . Diverticulosis of colon perhaps in 1990s  . Ascites 11/2004    "early" SBP on paracentesis.   . Portal hypertensive gastropathy 2006  . Fracture of wrist 2014 spring vs early summer    refused orthopods suggestion to set the bone.    Past Surgical History  Procedure Laterality Date  . Inguinal hernia repair    . Melanoma excision Left 02/2011    malignant melanoma of thigh  . Basal cell carcinoma excision Left 11/2012    lower eyelid  . Esophagogastroduodenoscopy N/A 08/04/2013    Procedure: ESOPHAGOGASTRODUODENOSCOPY  (EGD);  Surgeon: Ladene Artist, MD;  Location: [facility] ENDOSCOPY;  Service: Endoscopy;  Laterality: N/A;    reports that he has never smoked. He has never used smokeless tobacco. He reports that he does not drink alcohol or use illicit drugs. family history includes Cancer (age of onset: 80) in his father; Heart disease in his mother and sister. No Known Allergies Current Outpatient Prescriptions on File Prior to Visit  Medication Sig Dispense Refill  . acetaminophen (TYLENOL) 325 MG tablet Take 650 mg by mouth every 6 (six) hours as needed for pain.    Marland Kitchen amLODipine (NORVASC) 10 MG tablet TAKE ONE TABLET BY MOUTH ONCE DAILY 90 tablet 3  . DEXILANT 60 MG capsule TAKE ONE CAPSULE EACH DAY 90 capsule 1  . furosemide (LASIX) 40 MG tablet Take 1 tablet (40 mg total) by mouth daily. 30 tablet 11  . HYDROcodone-acetaminophen (NORCO/VICODIN) 5-325 MG per tablet Take 1 tablet by mouth 2 (two) times daily. 60 tablet 0  . metoprolol succinate (TOPROL-XL) 100 MG 24 hr tablet TAKE 1 & 1/2 TABLETS DAILY- TAKE WITH ORIMMEDIATELY FOLLOWING A MEAL 45 tablet 5  . Multiple Vitamin (MULTIVITAMIN WITH MINERALS) TABS Take 1 tablet by mouth daily.    . simvastatin (ZOCOR) 20 MG tablet Take 20 mg by mouth daily at 6 PM.    . spironolactone (ALDACTONE) 25 MG tablet TAKE ONE TABLET EACH DAY 90 tablet 1  . zolpidem (AMBIEN) 10 MG tablet Take 1 tablet (10 mg total) by mouth  at bedtime as needed for sleep. 30 tablet 5   No current facility-administered medications on file prior to visit.   Review of Systems Constitutional: Negative for increased diaphoresis, other activity, appetite or siginficant weight change other than noted HENT: Negative for worsening hearing loss, ear pain, facial swelling, mouth sores and neck stiffness.   Eyes: Negative for other worsening pain, redness or visual disturbance.  Respiratory: Negative for shortness of breath and wheezing  Cardiovascular: Negative for chest pain and palpitations.    Gastrointestinal: Negative for diarrhea, blood in stool, abdominal distention or other pain Genitourinary: Negative for hematuria, flank pain or change in urine volume.  Musculoskeletal: Negative for myalgias or other joint complaints.  Skin: Negative for color change and wound or drainage.  Neurological: Negative for syncope and numbness. other than noted Hematological: Negative for adenopathy. or other swelling Psychiatric/Behavioral: Negative for hallucinations, SI, self-injury, decreased concentration or other worsening agitation.      Objective:   Physical Exam BP 116/64 mmHg  Pulse 73  Temp(Src) 97.8 F (36.6 C) (Oral)  Resp 18  SpO2 95% VS noted,  Constitutional: Pt is oriented to person, place, and time. Appears well-developed and well-nourished, in no significant distress Head: Normocephalic and atraumatic.  Right Ear: External ear normal.  Left Ear: External ear normal.  Nose: Nose normal.  Mouth/Throat: Oropharynx is clear and moist.  Eyes: Conjunctivae and EOM are normal. Pupils are equal, round, and reactive to light.  Neck: Normal range of motion. Neck supple. No JVD present. No tracheal deviation present or significant neck LA or mass Cardiovascular: Normal rate, regular rhythm, normal heart sounds and intact distal pulses.   Pulmonary/Chest: Effort normal and breath sounds without wheezing, few ? Chronic dry rales noted LLL  Abdominal: Soft. Bowel sounds are normal. NT. No HSM  Musculoskeletal: Normal range of motion. Exhibits no edema.  Lymphadenopathy:  Has no cervical adenopathy.  Neurological: Pt is alert and oriented to person, place, and time. Pt has normal reflexes. No cranial nerve deficit. Motor grossly intact Skin: Skin is warm and dry. No rash noted.  Psychiatric:  Has normal mood and affect. Behavior is normal.      Assessment & Plan:

## 2015-02-22 NOTE — Progress Notes (Signed)
Called and left message with pt's wife to advise her husband of dr Judi Cong note to hold antibiotic for now since pt was asymptomatic----but pt had already left to come here to office---so I spoke with pt in front lobby and advised him of dr Judi Cong note here in office, i also gave pt copy of lab results

## 2015-02-22 NOTE — Patient Instructions (Signed)

## 2015-02-22 NOTE — Telephone Encounter (Signed)
Patient called. He received a call from someone about his lab work. He would like to come back in to get the urinalysist? Redone. Please call patient

## 2015-02-23 DIAGNOSIS — R739 Hyperglycemia, unspecified: Secondary | ICD-10-CM | POA: Insufficient documentation

## 2015-02-23 NOTE — Assessment & Plan Note (Signed)
stable overall by history and exam, recent data reviewed with pt, and pt to continue medical treatment as before,  to f/u any worsening symptoms or concerns BP Readings from Last 3 Encounters:  02/22/15 116/64  01/18/15 116/64  08/23/14 140/72

## 2015-02-23 NOTE — Assessment & Plan Note (Signed)

## 2015-02-23 NOTE — Assessment & Plan Note (Signed)
Asympt, for a1c 

## 2015-03-07 ENCOUNTER — Other Ambulatory Visit: Payer: Self-pay | Admitting: Internal Medicine

## 2015-04-03 ENCOUNTER — Other Ambulatory Visit: Payer: Self-pay | Admitting: Internal Medicine

## 2015-06-14 ENCOUNTER — Other Ambulatory Visit: Payer: Self-pay | Admitting: Internal Medicine

## 2015-06-19 DIAGNOSIS — Z0279 Encounter for issue of other medical certificate: Secondary | ICD-10-CM

## 2015-06-25 ENCOUNTER — Telehealth: Payer: Self-pay | Admitting: Gastroenterology

## 2015-06-25 NOTE — Telephone Encounter (Signed)
Patient given recommendations. 

## 2015-06-25 NOTE — Telephone Encounter (Signed)
If he is SOB he need to call his PCP for advice. Not sure my next available will be soon.

## 2015-06-25 NOTE — Telephone Encounter (Signed)
Please call the patient and assess. If he is having an acute GI bleed he needs to go to the ED for evaluation.

## 2015-06-25 NOTE — Telephone Encounter (Signed)
Spoke with patient and he states he has not seen any blood or black stools but he has been short of breath for the last few weeks. He states it occurs when he walks around. He reports a history of peptic ulcer and wants to schedule an OV. Offered OV with extender but he declines and only wants to see Dr. Fuller Plan. Please, advise.

## 2015-07-10 ENCOUNTER — Other Ambulatory Visit: Payer: Self-pay | Admitting: Internal Medicine

## 2015-07-17 ENCOUNTER — Encounter: Payer: Self-pay | Admitting: Internal Medicine

## 2015-07-17 ENCOUNTER — Other Ambulatory Visit (INDEPENDENT_AMBULATORY_CARE_PROVIDER_SITE_OTHER): Payer: Medicare Other

## 2015-07-17 ENCOUNTER — Ambulatory Visit (INDEPENDENT_AMBULATORY_CARE_PROVIDER_SITE_OTHER): Payer: Medicare Other | Admitting: Internal Medicine

## 2015-07-17 ENCOUNTER — Ambulatory Visit (INDEPENDENT_AMBULATORY_CARE_PROVIDER_SITE_OTHER)
Admission: RE | Admit: 2015-07-17 | Discharge: 2015-07-17 | Disposition: A | Discharge status: Home / Self Care | Payer: Medicare Other | Source: Ambulatory Visit | Attending: Internal Medicine | Admitting: Internal Medicine

## 2015-07-17 VITALS — BP 120/62 | HR 76 | Temp 98.2°F | Ht 66.0 in | Wt 159.0 lb

## 2015-07-17 DIAGNOSIS — R0609 Other forms of dyspnea: Secondary | ICD-10-CM

## 2015-07-17 DIAGNOSIS — I10 Essential (primary) hypertension: Secondary | ICD-10-CM

## 2015-07-17 LAB — URINALYSIS, ROUTINE W REFLEX MICROSCOPIC
Bilirubin Urine: NEGATIVE
Ketones, ur: NEGATIVE
Leukocytes, UA: NEGATIVE
NITRITE: NEGATIVE
PH: 5.5 (ref 5.0–8.0)
Specific Gravity, Urine: 1.02 (ref 1.000–1.030)
TOTAL PROTEIN, URINE-UPE24: 100 — AB
URINE GLUCOSE: NEGATIVE
UROBILINOGEN UA: 0.2 (ref 0.0–1.0)

## 2015-07-17 LAB — CBC WITH DIFFERENTIAL/PLATELET
Basophils Absolute: 0 10*3/uL (ref 0.0–0.1)
Basophils Relative: 0.2 % (ref 0.0–3.0)
EOS ABS: 0.3 10*3/uL (ref 0.0–0.7)
EOS PCT: 2.7 % (ref 0.0–5.0)
HCT: 36.7 % — ABNORMAL LOW (ref 39.0–52.0)
HEMOGLOBIN: 12.4 g/dL — AB (ref 13.0–17.0)
LYMPHS ABS: 0.9 10*3/uL (ref 0.7–4.0)
Lymphocytes Relative: 8.3 % — ABNORMAL LOW (ref 12.0–46.0)
MCHC: 33.9 g/dL (ref 30.0–36.0)
MCV: 96.2 fl (ref 78.0–100.0)
MONO ABS: 0.8 10*3/uL (ref 0.1–1.0)
Monocytes Relative: 7.7 % (ref 3.0–12.0)
NEUTROS PCT: 81.1 % — AB (ref 43.0–77.0)
Neutro Abs: 8.8 10*3/uL — ABNORMAL HIGH (ref 1.4–7.7)
Platelets: 231 10*3/uL (ref 150.0–400.0)
RBC: 3.81 Mil/uL — AB (ref 4.22–5.81)
RDW: 13.5 % (ref 11.5–15.5)
WBC: 10.9 10*3/uL — AB (ref 4.0–10.5)

## 2015-07-17 LAB — H. PYLORI ANTIBODY, IGG: H Pylori IgG: NEGATIVE

## 2015-07-17 LAB — BASIC METABOLIC PANEL
BUN: 36 mg/dL — AB (ref 6–23)
CHLORIDE: 109 meq/L (ref 96–112)
CO2: 26 meq/L (ref 19–32)
Calcium: 9.1 mg/dL (ref 8.4–10.5)
Creatinine, Ser: 1.71 mg/dL — ABNORMAL HIGH (ref 0.40–1.50)
GFR: 40.73 mL/min — AB (ref >60.00)
GLUCOSE: 116 mg/dL — AB (ref 70–99)
POTASSIUM: 5.1 meq/L (ref 3.5–5.1)
SODIUM: 141 meq/L (ref 135–145)

## 2015-07-17 LAB — HEPATIC FUNCTION PANEL
ALBUMIN: 3.9 g/dL (ref 3.5–5.2)
ALK PHOS: 87 U/L (ref 39–117)
ALT: 26 U/L (ref 0–53)
AST: 27 U/L (ref 0–37)
Bilirubin, Direct: 0.2 mg/dL (ref 0.0–0.3)
Total Bilirubin: 0.6 mg/dL (ref 0.2–1.2)
Total Protein: 7.2 g/dL (ref 6.0–8.3)

## 2015-07-17 NOTE — Assessment & Plan Note (Signed)
stable overall by history and exam, recent data reviewed with pt, and pt to continue medical treatment as before,  to f/u any worsening symptoms or concerns BP Readings from Last 3 Encounters:  07/17/15 120/62  02/22/15 116/64  01/18/15 116/64

## 2015-07-17 NOTE — Progress Notes (Signed)
Subjective:    Patient ID: [name], male    DOB: [birth year], 81 y.o.   MRN: [id number]  HPI  Here to f/u with ? 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  Pt denies new neurological symptoms such as new headache, or facial or extremity weakness or numbness   Pt denies polydipsia, polyuria. Denies worsening reflux, abd pain, dysphagia, n/v, bowel change or blood - no BRB or melena.  Overall situation somewhat to prior episode of PUD with bleeding.     + prn antacid use but none recent.  Has ? HOCM but per Dr Angelena Form not needing change in tx based on exam, echo mar 2015 Past Medical History  Diagnosis Date  . Hypertrophic cardiomyopathy   . HTN (hypertension)   . HLD (hyperlipidemia)   . Alcoholic cirrhosis   . GERD (gastroesophageal reflux disease)   . Renal insufficiency   . Alcoholism   . Alcoholic encephalopathy 04/736  . Rhabdomyolysis 01/2005  . Colon polyp perhaps in 1990s    pathology/type not known  . Diverticulosis of colon perhaps in 1990s  . Ascites 11/2004    "early" SBP on paracentesis.   . Portal hypertensive gastropathy 2006  . Fracture of wrist 2014 spring vs early summer    refused orthopods suggestion to set the bone.    Past Surgical History  Procedure Laterality Date  . Inguinal hernia repair    . Melanoma excision Left 02/2011    malignant melanoma of thigh  . Basal cell carcinoma excision Left 11/2012    lower eyelid  . Esophagogastroduodenoscopy N/A 08/04/2013    Procedure: ESOPHAGOGASTRODUODENOSCOPY (EGD);  Surgeon: Ladene Artist, MD;  Location: [facility] ENDOSCOPY;  Service: Endoscopy;  Laterality: N/A;    reports that he has never smoked. He has never used smokeless tobacco. He reports that he does not drink alcohol or use illicit drugs. family history  includes Cancer (age of onset: 50) in his father; Heart disease in his mother and sister. No Known Allergies Current Outpatient Prescriptions on File Prior to Visit  Medication Sig Dispense Refill  . acetaminophen (TYLENOL) 325 MG tablet Take 650 mg by mouth every 6 (six) hours as needed for pain.    Marland Kitchen amLODipine (NORVASC) 10 MG tablet TAKE ONE TABLET BY MOUTH ONCE DAILY 90 tablet 3  . DEXILANT 60 MG capsule TAKE ONE CAPSULE EACH DAY 90 capsule 1  . furosemide (LASIX) 40 MG tablet Take 1 tablet (40 mg total) by mouth daily. 30 tablet 11  . HYDROcodone-acetaminophen (NORCO/VICODIN) 5-325 MG per tablet Take 1 tablet by mouth 2 (two) times daily. 60 tablet 0  . metoprolol succinate (TOPROL-XL) 100 MG 24 hr tablet TAKE ONE AND ONE-HALF TABLETS DAILY -TAKE WITH OR IMMEDIATELY FOLLOWING A MEAL 45 tablet 2  . Multiple Vitamin (MULTIVITAMIN WITH MINERALS) TABS Take 1 tablet by mouth daily.    . simvastatin (ZOCOR) 20 MG tablet Take 20 mg by mouth daily at 6 PM.    . spironolactone (ALDACTONE) 25 MG tablet TAKE ONE TABLET EACH DAY 90 tablet 1  . zolpidem (AMBIEN) 10 MG tablet Take 1 tablet (10 mg total) by mouth at bedtime as needed for sleep. 30 tablet 5   No current facility-administered medications on file prior to visit.   . Review of Systems  Constitutional: Negative for unusual  diaphoresis or night sweats HENT: Negative for ringing in ear or discharge Eyes: Negative for double vision or worsening visual disturbance.  Respiratory: Negative for choking and stridor.   Gastrointestinal: Negative for vomiting or other signifcant bowel change Genitourinary: Negative for hematuria or change in urine volume.  Musculoskeletal: Negative for other MSK pain or swelling Skin: Negative for color change and worsening wound.  Neurological: Negative for tremors and numbness other than noted  Psychiatric/Behavioral: Negative for decreased concentration or agitation other than above       Objective:    Physical Exam BP 120/62 mmHg  Pulse 76  Temp(Src) 98.2 F (36.8 C) (Oral)  Ht 5\' 6"  (1.676 m)  Wt 159 lb (72.122 kg)  BMI 25.68 kg/m2  SpO2 97% VS noted, nontoxic Constitutional: Pt appears in no significant distress HENT: Head: NCAT.  Right Ear: External ear normal.  Left Ear: External ear normal.  Eyes: . Pupils are equal, round, and reactive to light. Conjunctivae and EOM are normal Neck: Normal range of motion. Neck supple.  Cardiovascular: Normal rate and regular rhythm.  With gr 2-3/6 SEM LLSB and RUSB - ? Radiating murmur Pulmonary/Chest: Effort normal and breath sounds without rales or wheezing.  Abd:  Soft, NT, ND, + BS Neurological: Pt is alert. Not confused , motor grossly intact Skin: Skin is warm. No rash, no LE edema Psychiatric: Pt behavior is normal. No agitation. mild nervous    Assessment & Plan:

## 2015-07-17 NOTE — Patient Instructions (Signed)
Please continue all other medications as before, and refills have been done if requested.  Please have the pharmacy call with any other refills you may need.  You will be contacted regarding the referral for: Cardiology  Please keep your appointments with your specialists as you may have planned  Please go to the XRAY Department in the Basement (go straight as you get off the elevator) for the x-ray testing  Please go to the LAB in the Basement (turn left off the elevator) for the tests to be done today  You will be contacted by phone if any changes need to be made immediately.  Otherwise, you will receive a letter about your results with an explanation, but please check with MyChart first.  Please remember to sign up for MyChart if you have not done so, as this will be important to you in the future with finding out test results, communicating by private email, and scheduling acute appointments online when needed.

## 2015-07-17 NOTE — Assessment & Plan Note (Addendum)
Etiology unclear, today for labs including cbc as documented, also add H pylori given prior PUD hx, cont PPI, check CXR, refer cardiology but hold on repeat echo at this time  Note:  Total time for pt hx, exam, review of record with pt in the room, determination of diagnoses and plan for further eval and tx is > 40 min, with over 50% spent in coordination and counseling of patient

## 2015-07-18 ENCOUNTER — Telehealth: Payer: Self-pay | Admitting: Internal Medicine

## 2015-07-18 MED ORDER — ZOLPIDEM TARTRATE 10 MG PO TABS: 10.0000 mg | ORAL_TABLET | Freq: Every evening | ORAL | Status: DC | PRN

## 2015-07-18 NOTE — Telephone Encounter (Signed)
Patient was in yesterday and he needs a new prescription for zolpidem (AMBIEN) 10 MG tablet [[id number] [facility]acy is [facility]

## 2015-07-18 NOTE — Telephone Encounter (Signed)
[place] for this, Teacher, English as a foreign language to [place], but let pt know the pharmacy might not fill this, as recently it has been the practice to keep persons > 79yo at 5 mg (not the 10 mg)

## 2015-07-18 NOTE — Telephone Encounter (Signed)
Rx faxed to pharmacy  

## 2015-07-22 ENCOUNTER — Ambulatory Visit (INDEPENDENT_AMBULATORY_CARE_PROVIDER_SITE_OTHER): Payer: Medicare Other | Admitting: Nurse Practitioner

## 2015-07-22 ENCOUNTER — Encounter: Payer: Self-pay | Admitting: Nurse Practitioner

## 2015-07-22 VITALS — BP 130/60 | HR 88 | Ht 69.0 in | Wt 160.2 lb

## 2015-07-22 DIAGNOSIS — E785 Hyperlipidemia, unspecified: Secondary | ICD-10-CM

## 2015-07-22 DIAGNOSIS — R0600 Dyspnea, unspecified: Secondary | ICD-10-CM | POA: Diagnosis not present

## 2015-07-22 DIAGNOSIS — I10 Essential (primary) hypertension: Secondary | ICD-10-CM

## 2015-07-22 DIAGNOSIS — I421 Obstructive hypertrophic cardiomyopathy: Secondary | ICD-10-CM

## 2015-07-22 LAB — BRAIN NATRIURETIC PEPTIDE: Pro B Natriuretic peptide (BNP): 61 pg/mL (ref 0.0–100.0)

## 2015-07-22 NOTE — Patient Instructions (Addendum)
We will be checking the following labs today - BNP  Medication Instructions:    Continue with your current medicines.     Testing/Procedures To Be Arranged:  Echocardiogram  Follow-Up:   Will see how your echocardiogram turns out and then decide about follow up.  Other Special Instructions:   Limit your salt intake  Keep wearing support stockings  Call the [place] office at [phone] if you have any questions, problems or concerns.

## 2015-07-22 NOTE — Progress Notes (Signed)
CARDIOLOGY OFFICE NOTE  Date:  07/22/2015    [name] Date of Birth: [birth year] Medical Record #[id number]  PCP:  [name] Cower, MD  Cardiologist:  [facility]    Chief Complaint  Patient presents with  . Shortness of Breath    Work in visit - seen for Dr. Angelena [name]    History of Present Illness: [name] is a 81 y.o. male who presents today for a work in visit. Seen for Dr. Angelena [name].  He has a history of hypertrophic cardiomyopathy, HTN, HLD, & alcohol abuse with previous ascites, portal gastropathy, coagulopathy and mental status issues. He has been followed in the past by Dr. Lia [name]. Last echo 01/23/14 with no clear HOCM physiology, normal LV function.   He was last seen here back in March - at that time he noted dyspnea and lower extremity edema.   Referred back by PCP for shortness of breath and swelling. Noted recurrent ulcer with bleeding.   Comes in today. Here alone. He says there was concern for heart failure. He continues to have some swelling and dyspnea - does not sound like it is any worse. He uses support stockings most of the time. He eats out most of his meals - usually at [facility] and gets too much salt. Weight fairly stable. No chest pain/tightenss/heaviness. No reports of bleeding/GI issues. Alcohol not discussed. He feels like he is at his baseline since his last visit.   Past Medical History  Diagnosis Date  . Hypertrophic cardiomyopathy   . HTN (hypertension)   . HLD (hyperlipidemia)   . Alcoholic cirrhosis   . GERD (gastroesophageal reflux disease)   . Renal insufficiency   . Alcoholism   . Alcoholic encephalopathy 11/4479  . Rhabdomyolysis 01/2005  . Colon polyp perhaps in 1990s    pathology/type not known  . Diverticulosis of colon perhaps in 1990s  . Ascites 11/2004    "early" SBP on paracentesis.   . Portal hypertensive gastropathy 2006  . Fracture of wrist 2014 spring vs early summer    refused orthopods suggestion to set the  bone.     Past Surgical History  Procedure Laterality Date  . Inguinal hernia repair    . Melanoma excision Left 02/2011    malignant melanoma of thigh  . Basal cell carcinoma excision Left 11/2012    lower eyelid  . Esophagogastroduodenoscopy N/A 08/04/2013    Procedure: ESOPHAGOGASTRODUODENOSCOPY (EGD);  Surgeon: Ladene Artist, MD;  Location: [facility] ENDOSCOPY;  Service: Endoscopy;  Laterality: N/A;     Medications: Current Outpatient Prescriptions  Medication Sig Dispense Refill  . acetaminophen (TYLENOL) 325 MG tablet Take 650 mg by mouth every 6 (six) hours as needed for pain.    Marland Kitchen amLODipine (NORVASC) 10 MG tablet TAKE ONE TABLET BY MOUTH ONCE DAILY 90 tablet 3  . DEXILANT 60 MG capsule TAKE ONE CAPSULE EACH DAY 90 capsule 1  . furosemide (LASIX) 40 MG tablet Take 1 tablet (40 mg total) by mouth daily. 30 tablet 11  . HYDROcodone-acetaminophen (NORCO/VICODIN) 5-325 MG per tablet Take 1 tablet by mouth 2 (two) times daily. 60 tablet 0  . metoprolol succinate (TOPROL-XL) 100 MG 24 hr tablet TAKE ONE AND ONE-HALF TABLETS DAILY -TAKE WITH OR IMMEDIATELY FOLLOWING A MEAL 45 tablet 2  . Multiple Vitamin (MULTIVITAMIN WITH MINERALS) TABS Take 1 tablet by mouth daily.    . simvastatin (ZOCOR) 20 MG tablet Take 20 mg by mouth daily at 6 PM.    .  spironolactone (ALDACTONE) 25 MG tablet TAKE ONE TABLET EACH DAY 90 tablet 1  . zolpidem (AMBIEN) 10 MG tablet Take 1 tablet (10 mg total) by mouth at bedtime as needed for sleep. 30 tablet 5   No current facility-administered medications for this visit.    Allergies: No Known Allergies  Social History: The patient  reports that he has never smoked. He has never used smokeless tobacco. He reports that he does not drink alcohol or use illicit drugs.   Family History: The patient's family history includes Cancer (age of onset: 59) in his father; Heart disease in his mother and sister.   Review of Systems: Please see the history of present  illness.   Otherwise, the review of systems is positive for none.   All other systems are reviewed and negative.   Physical Exam: VS:  BP 130/60 mmHg  Pulse 88  Ht 5\' 9"  (1.753 m)  Wt 160 lb 3.2 oz (72.666 kg)  BMI 23.65 kg/m2  SpO2 95% .  BMI Body mass index is 23.65 kg/(m^2).  Wt Readings from Last 3 Encounters:  07/22/15 160 lb 3.2 oz (72.666 kg)  07/17/15 159 lb (72.122 kg)  01/18/15 158 lb (71.668 kg)    General: Pleasant. Chronically ill but in no acute distress.  HEENT: Normal. Neck: Supple, no JVD, carotid bruits, or masses noted.  Cardiac: Regular rate and rhythm. Harsh murmur noted. +1 edema.  Respiratory:  Lungs are clear to auscultation bilaterally with normal work of breathing.  GI: Soft and nontender.  MS: No deformity or atrophy. Gait and ROM intact. Skin: Warm and dry. Color is sallow. Neuro:  Strength and sensation are intact and no gross focal deficits noted.  Psych: Alert, appropriate and with normal affect.   LABORATORY DATA:  EKG:  EKG is ordered today. This shows NSR with 1st degree AV block and RBBB - unchanged.   Lab Results  Component Value Date   WBC 10.9* 07/17/2015   HGB 12.4* 07/17/2015   HCT 36.7* 07/17/2015   PLT 231.0 07/17/2015   GLUCOSE 116* 07/17/2015   CHOL 106 02/22/2015   TRIG 138.0 02/22/2015   HDL 45.60 02/22/2015   LDLCALC 33 02/22/2015   ALT 26 07/17/2015   AST 27 07/17/2015   NA 141 07/17/2015   K 5.1 07/17/2015   CL 109 07/17/2015   CREATININE 1.71* 07/17/2015   BUN 36* 07/17/2015   CO2 26 07/17/2015   TSH 0.88 02/22/2015   INR 1.15 08/04/2013   HGBA1C 5.7 02/22/2015    BNP (last 3 results) No results for input(s): BNP in the last 8760 hours.  ProBNP (last 3 results) No results for input(s): PROBNP in the last 8760 hours.   Other Studies Reviewed Today:  Echo Study Conclusions from 01/2014  - Left ventricle: There is narrowing of the sub aortic area. No SAM or web seen Minimal gradient through tunnel  like area formed by basal septal hypertrophy The cavity size was normal. Wall thickness was increased in a pattern of mild LVH. There was mild focal basal hypertrophy of the septum. Systolic function was normal. The estimated ejection fraction was in the range of 60% to 65%. - Aortic valve: Moderately calcified valve with small gradient in systole but no AS Mild regurgitation. Valve area: 2.58cm^2(VTI). Valve area: 2.47cm^2 (Vmax). - Left atrium: The atrium was mildly dilated. - Atrial septum: No defect or patent foramen ovale was identified.  Assessment/Plan: 1. Dyspnea/swelling - seems to really be at his baseline with  no significant change that I can see - will get his echocardiogram and check a  BNP - would leave on current course of meds for now and be cautious with Lasix. He needs to restrict his salt and continue with support stockings.  2. HOCM - without clear physiology noted on last echo  3. Alcohol abuse  4. PUD   Current medicines are reviewed with the patient today.  The patient does not have concerns regarding medicines other than what has been noted above.  The following changes have been made:  See above.  Labs/ tests ordered today include:    Orders Placed This Encounter  Procedures  . Brain natriuretic peptide  . ECHOCARDIOGRAM COMPLETE     Disposition:   FU with Dr. Angelena [name] after studies complete.   Patient is agreeable to this plan and will call if any problems develop in the interim.   Signed: Burtis Junes, RN, ANP-C 07/22/2015 3:29 PM  [facility] [address] [place], [place]  [address] Phone: [phone] Fax: [phone]

## 2015-07-23 ENCOUNTER — Telehealth: Payer: Self-pay | Admitting: Nurse Practitioner

## 2015-07-23 NOTE — Telephone Encounter (Signed)
New message ° ° ° ° ° ° °Calling to get lab results °

## 2015-07-25 NOTE — Addendum Note (Signed)
Addended by: Freada Bergeron on: 07/25/2015 05:58 PM   Modules accepted: Orders

## 2015-07-26 ENCOUNTER — Other Ambulatory Visit: Payer: Self-pay

## 2015-07-26 ENCOUNTER — Encounter: Payer: Self-pay | Admitting: Internal Medicine

## 2015-07-26 ENCOUNTER — Ambulatory Visit (HOSPITAL_COMMUNITY): Payer: Medicare Other | Attending: Cardiovascular Disease

## 2015-07-26 DIAGNOSIS — I10 Essential (primary) hypertension: Secondary | ICD-10-CM | POA: Insufficient documentation

## 2015-07-26 DIAGNOSIS — E785 Hyperlipidemia, unspecified: Secondary | ICD-10-CM | POA: Insufficient documentation

## 2015-07-26 DIAGNOSIS — I517 Cardiomegaly: Secondary | ICD-10-CM | POA: Diagnosis not present

## 2015-07-26 DIAGNOSIS — I351 Nonrheumatic aortic (valve) insufficiency: Secondary | ICD-10-CM | POA: Diagnosis not present

## 2015-07-26 DIAGNOSIS — I059 Rheumatic mitral valve disease, unspecified: Secondary | ICD-10-CM | POA: Insufficient documentation

## 2015-07-26 DIAGNOSIS — R0600 Dyspnea, unspecified: Secondary | ICD-10-CM | POA: Insufficient documentation

## 2015-07-26 DIAGNOSIS — I421 Obstructive hypertrophic cardiomyopathy: Secondary | ICD-10-CM

## 2015-08-01 ENCOUNTER — Telehealth: Payer: Self-pay | Admitting: Internal Medicine

## 2015-08-01 NOTE — Telephone Encounter (Signed)
Pt called and states he received his lab results and there is some things on there that isn't true and wants to have it changed in his chart Can you please call him to discuss.

## 2015-08-02 NOTE — Telephone Encounter (Signed)
Pt advised and agreed to discuss with MD at next OV

## 2015-08-15 ENCOUNTER — Other Ambulatory Visit: Payer: Self-pay | Admitting: Internal Medicine

## 2015-08-16 ENCOUNTER — Other Ambulatory Visit: Payer: Self-pay | Admitting: Internal Medicine

## 2015-08-27 ENCOUNTER — Ambulatory Visit (INDEPENDENT_AMBULATORY_CARE_PROVIDER_SITE_OTHER): Payer: Medicare Other | Admitting: Internal Medicine

## 2015-08-27 ENCOUNTER — Encounter: Payer: Self-pay | Admitting: Internal Medicine

## 2015-08-27 VITALS — BP 126/68 | HR 94 | Temp 97.6°F | Ht 69.0 in | Wt 160.0 lb

## 2015-08-27 DIAGNOSIS — I10 Essential (primary) hypertension: Secondary | ICD-10-CM

## 2015-08-27 DIAGNOSIS — E785 Hyperlipidemia, unspecified: Secondary | ICD-10-CM

## 2015-08-27 DIAGNOSIS — K7030 Alcoholic cirrhosis of liver without ascites: Secondary | ICD-10-CM

## 2015-08-27 DIAGNOSIS — R0609 Other forms of dyspnea: Secondary | ICD-10-CM

## 2015-08-27 DIAGNOSIS — Z23 Encounter for immunization: Secondary | ICD-10-CM | POA: Diagnosis not present

## 2015-08-27 DIAGNOSIS — Z0189 Encounter for other specified special examinations: Secondary | ICD-10-CM

## 2015-08-27 DIAGNOSIS — Z0000 Encounter for general adult medical examination without abnormal findings: Secondary | ICD-10-CM

## 2015-08-27 NOTE — Assessment & Plan Note (Signed)
stable overall by history and exam, recent data reviewed with pt, and pt to continue medical treatment as before,  to f/u any worsening symptoms or concerns BP Readings from Last 3 Encounters:  08/27/15 126/68  07/22/15 130/60  07/17/15 120/62

## 2015-08-27 NOTE — Progress Notes (Signed)
Subjective:    Patient ID: [name], male    DOB: 13-Sep-1929, 81 y.o.   MRN: [id number]  HPI  Here to f/u; overall doing ok,  Pt denies chest pain, increasing sob or doe, wheezing, orthopnea, PND, increased LE swelling, palpitations, dizziness or syncope, in fact has no dyspnea as described last visit.  Pt denies new neurological symptoms such as new headache, or facial or extremity weakness or numbness.  Pt denies polydipsia, polyuria, or low sugar episode.   Pt denies new neurological symptoms such as new headache, or facial or extremity weakness or numbness.   Pt states overall good compliance with meds, mostly trying to follow appropriate diet, with wt overall stable, walks with cane, no falls  EF 55-60% in sept 2016, + grade I diast dysfunction., bnp normal Past Medical History  Diagnosis Date  . Hypertrophic cardiomyopathy ([place])   . HTN (hypertension)   . HLD (hyperlipidemia)   . Alcoholic cirrhosis ([place])   . GERD (gastroesophageal reflux disease)   . Renal insufficiency   . Alcoholism ([place])   . Alcoholic encephalopathy ([place]) 01/2005  . Rhabdomyolysis 01/2005  . Colon polyp perhaps in 1990s    pathology/type not known  . Diverticulosis of colon perhaps in 1990s  . Ascites 11/2004    "early" SBP on paracentesis.   . Portal hypertensive gastropathy 2006  . Fracture of wrist 2014 spring vs early summer    refused orthopods suggestion to set the bone.    Past Surgical History  Procedure Laterality Date  . Inguinal hernia repair    . Melanoma excision Left 02/2011    malignant melanoma of thigh  . Basal cell carcinoma excision Left 11/2012    lower eyelid  . Esophagogastroduodenoscopy N/A 08/04/2013    Procedure: ESOPHAGOGASTRODUODENOSCOPY (EGD);  Surgeon: Ladene Artist, MD;  Location: [facility] ENDOSCOPY;  Service: Endoscopy;  Laterality: N/A;    reports that he has never smoked. He has never used smokeless tobacco. He reports that he does not drink alcohol or use illicit  drugs. family history includes Cancer (age of onset: 42) in his father; Heart disease in his mother and sister. No Known Allergies Current Outpatient Prescriptions on File Prior to Visit  Medication Sig Dispense Refill  . acetaminophen (TYLENOL) 325 MG tablet Take 650 mg by mouth every 6 (six) hours as needed for pain.    Marland Kitchen amLODipine (NORVASC) 10 MG tablet TAKE ONE TABLET BY MOUTH ONCE DAILY 90 tablet 3  . DEXILANT 60 MG capsule TAKE ONE CAPSULE EACH DAY 90 capsule 1  . furosemide (LASIX) 40 MG tablet Take 1 tablet (40 mg total) by mouth daily. 30 tablet 11  . metoprolol succinate (TOPROL-XL) 100 MG 24 hr tablet TAKE ONE AND ONE-HALF TABLETS DAILY -TAKE WITH OR IMMEDIATELY FOLLOWING A MEAL 45 tablet 2  . Multiple Vitamin (MULTIVITAMIN WITH MINERALS) TABS Take 1 tablet by mouth daily.    . simvastatin (ZOCOR) 20 MG tablet Take 20 mg by mouth daily at 6 PM.    . spironolactone (ALDACTONE) 25 MG tablet TAKE ONE TABLET EACH DAY 90 tablet 1  . zolpidem (AMBIEN) 10 MG tablet Take 1 tablet (10 mg total) by mouth at bedtime as needed for sleep. 30 tablet 5   No current facility-administered medications on file prior to visit.   Review of Systems  Constitutional: Negative for unusual diaphoresis or night sweats HENT: Negative for ringing in ear or discharge Eyes: Negative for double vision or worsening visual disturbance.  Respiratory: Negative for choking and stridor.   Gastrointestinal: Negative for vomiting or other signifcant bowel change Genitourinary: Negative for hematuria or change in urine volume.  Musculoskeletal: Negative for other MSK pain or swelling Skin: Negative for color change and worsening wound.  Neurological: Negative for tremors and numbness other than noted  Psychiatric/Behavioral: Negative for decreased concentration or agitation other than above       Objective:   Physical Exam BP 126/68 mmHg  Pulse 94  Temp(Src) 97.6 F (36.4 C) (Oral)  Ht 5\' 9"  (1.753 m)  Wt  160 lb (72.576 kg)  BMI 23.62 kg/m2  SpO2 98% VS noted, not ill appearing Constitutional: Pt appears in no significant distress HENT: Head: NCAT.  Right Ear: External ear normal.  Left Ear: External ear normal.  Eyes: . Pupils are equal, round, and reactive to light. Conjunctivae and EOM are normal Neck: Normal range of motion. Neck supple.  Cardiovascular: Normal rate and regular rhythm.   Pulmonary/Chest: Effort normal and breath sounds without wheezing, but does have few bibas crackle - ? Dry rales.  Abd:  Soft, NT, ND, + BS Neurological: Pt is alert. Not confused , motor grossly intact Skin: Skin is warm. No rash, no LE edema Psychiatric: Pt behavior is normal. No agitation.    Assessment & Plan:

## 2015-08-27 NOTE — Assessment & Plan Note (Signed)
Denies significant cirrohosis today though has been noted on imaging since at least 2005, no longer take ETOH,  to f/u any worsening symptoms or concerns

## 2015-08-27 NOTE — Assessment & Plan Note (Signed)
Resolved per pt, Mild when present, now stable overall by history and exam, recent data reviewed with pt, and pt to continue medical treatment as before,  to f/u any worsening symptoms or concerns Lab Results  Component Value Date   WBC 10.9* 07/17/2015   HGB 12.4* 07/17/2015   HCT 36.7* 07/17/2015   PLT 231.0 07/17/2015   GLUCOSE 116* 07/17/2015   CHOL 106 02/22/2015   TRIG 138.0 02/22/2015   HDL 45.60 02/22/2015   LDLCALC 33 02/22/2015   ALT 26 07/17/2015   AST 27 07/17/2015   NA 141 07/17/2015   K 5.1 07/17/2015   CL 109 07/17/2015   CREATININE 1.71* 07/17/2015   BUN 36* 07/17/2015   CO2 26 07/17/2015   TSH 0.88 02/22/2015   INR 1.15 08/04/2013   HGBA1C 5.7 02/22/2015

## 2015-08-27 NOTE — Assessment & Plan Note (Signed)
stable overall by history and exam, recent data reviewed with pt, and pt to continue medical treatment as before,  to f/u any worsening symptoms or concerns Lab Results  Component Value Date   LDLCALC 33 02/22/2015

## 2015-08-27 NOTE — Patient Instructions (Addendum)
You had the flu shot today  Please continue all other medications as before, and refills have been done if requested.  Please have the pharmacy call with any other refills you may need.  Please continue your efforts at being more active, low cholesterol diet, and weight control.  Please keep your appointments with your specialists as you may have planned  Please return in 6 months, or sooner if needed, with Lab testing done 3-5 days before  

## 2015-08-27 NOTE — Progress Notes (Signed)
Pre visit review using our clinic review tool, if applicable. No additional management support is needed unless otherwise documented below in the visit note. 

## 2015-09-14 ENCOUNTER — Other Ambulatory Visit: Payer: Self-pay | Admitting: Internal Medicine

## 2015-10-04 ENCOUNTER — Other Ambulatory Visit (INDEPENDENT_AMBULATORY_CARE_PROVIDER_SITE_OTHER): Payer: Medicare Other

## 2015-10-04 ENCOUNTER — Encounter: Payer: Self-pay | Admitting: Internal Medicine

## 2015-10-04 ENCOUNTER — Ambulatory Visit (INDEPENDENT_AMBULATORY_CARE_PROVIDER_SITE_OTHER): Payer: Medicare Other | Admitting: Internal Medicine

## 2015-10-04 ENCOUNTER — Other Ambulatory Visit (INDEPENDENT_AMBULATORY_CARE_PROVIDER_SITE_OTHER): Payer: Self-pay

## 2015-10-04 ENCOUNTER — Ambulatory Visit (INDEPENDENT_AMBULATORY_CARE_PROVIDER_SITE_OTHER)
Admission: RE | Admit: 2015-10-04 | Discharge: 2015-10-04 | Disposition: A | Discharge status: Home / Self Care | Payer: Medicare Other | Source: Ambulatory Visit | Attending: Internal Medicine | Admitting: Internal Medicine

## 2015-10-04 VITALS — BP 138/64 | HR 79 | Temp 98.1°F | Ht 69.0 in | Wt 162.0 lb

## 2015-10-04 DIAGNOSIS — I10 Essential (primary) hypertension: Secondary | ICD-10-CM

## 2015-10-04 DIAGNOSIS — R0609 Other forms of dyspnea: Secondary | ICD-10-CM

## 2015-10-04 DIAGNOSIS — R413 Other amnesia: Secondary | ICD-10-CM

## 2015-10-04 LAB — BASIC METABOLIC PANEL
BUN: 36 mg/dL — ABNORMAL HIGH (ref 6–23)
CALCIUM: 8.8 mg/dL (ref 8.4–10.5)
CHLORIDE: 114 meq/L — AB (ref 96–112)
CO2: 18 meq/L — AB (ref 19–32)
Creatinine, Ser: 1.81 mg/dL — ABNORMAL HIGH (ref 0.40–1.50)
GFR: 38.12 mL/min — ABNORMAL LOW (ref >60.00)
GLUCOSE: 114 mg/dL — AB (ref 70–99)
POTASSIUM: 5.3 meq/L — AB (ref 3.5–5.1)
SODIUM: 141 meq/L (ref 135–145)

## 2015-10-04 LAB — CBC WITH DIFFERENTIAL/PLATELET
BASOS ABS: 0.1 10*3/uL (ref 0.0–0.1)
Basophils Relative: 0.5 % (ref 0.0–3.0)
EOS ABS: 0.3 10*3/uL (ref 0.0–0.7)
Eosinophils Relative: 2.9 % (ref 0.0–5.0)
HCT: 36.4 % — ABNORMAL LOW (ref 39.0–52.0)
HEMOGLOBIN: 12.3 g/dL — AB (ref 13.0–17.0)
LYMPHS ABS: 0.9 10*3/uL (ref 0.7–4.0)
Lymphocytes Relative: 8.8 % — ABNORMAL LOW (ref 12.0–46.0)
MCHC: 33.8 g/dL (ref 30.0–36.0)
MCV: 95.7 fl (ref 78.0–100.0)
MONO ABS: 0.9 10*3/uL (ref 0.1–1.0)
Monocytes Relative: 8.8 % (ref 3.0–12.0)
NEUTROS PCT: 79 % — AB (ref 43.0–77.0)
Neutro Abs: 8.4 10*3/uL — ABNORMAL HIGH (ref 1.4–7.7)
Platelets: 254 10*3/uL (ref 150.0–400.0)
RBC: 3.81 Mil/uL — AB (ref 4.22–5.81)
RDW: 13.4 % (ref 11.5–15.5)
WBC: 10.6 10*3/uL — AB (ref 4.0–10.5)

## 2015-10-04 LAB — HEPATIC FUNCTION PANEL
ALBUMIN: 3.5 g/dL (ref 3.5–5.2)
ALK PHOS: 88 U/L (ref 39–117)
ALT: 15 U/L (ref 0–53)
AST: 19 U/L (ref 0–37)
Bilirubin, Direct: 0.1 mg/dL (ref 0.0–0.3)
TOTAL PROTEIN: 6.6 g/dL (ref 6.0–8.3)
Total Bilirubin: 0.5 mg/dL (ref 0.2–1.2)

## 2015-10-04 NOTE — Patient Instructions (Addendum)
Please continue all other medications as before, and refills have been done if requested.  Please have the pharmacy call with any other refills you may need.  You will be contacted regarding the referral for: pulmonary  Please keep your appointments with your specialists as you may have planned  Please go to the XRAY Department in the Basement (go straight as you get off the elevator) for the x-ray testing  Please go to the LAB in the Basement (turn left off the elevator) for the tests to be done today  You will be contacted by phone if any changes need to be made immediately.  Otherwise, you will receive a letter about your results with an explanation, but please check with MyChart first.  Please remember to sign up for MyChart if you have not done so, as this will be important to you in the future with finding out test results, communicating by private email, and scheduling acute appointments online when needed.  Please return in 2 months, or sooner if needed

## 2015-10-04 NOTE — Progress Notes (Addendum)
Subjective:    Patient ID: [name], male    DOB: 18-Oct-1929, 81 y.o.   MRN: [id number]  HPI  Here to f/u, mentions worsening sob/doe since last visit, though it had seemed to improved.  Appears today to have some ? Mild cognitive impairment as well, though he denies.  Cannot recall events of evaluations with me and cardiology in the past 2 months.  Is a retired Industrial/product designer, mentions again today his hx of prepyloric ulcer and wonders if dyspnea is anemia related.  Denies hematemesis, dark stools., BRB or Denies worsening reflux, abd pain, dysphagia, n/v, bowel change or blood. No fever, cough, ST and  Pt denies fever, wt loss, night sweats, loss of appetite, or other constitutional symptoms Has not seen pulm or recent PFT's. Denis wheezing, declines inhaler trial.  Ambulatory o2 sat in the office today drops from 96 to 92% at 50 ft with HR to 113, and mild tachypnic, improved with rest.  Has seen cardiology PA with Echo with normal EF, gr 1 diast dysfxn only, no evidence for volume overload. conts on daily lasix 40 qd, denies orthostasis Past Medical History  Diagnosis Date  . Hypertrophic cardiomyopathy ([place])   . HTN (hypertension)   . HLD (hyperlipidemia)   . Alcoholic cirrhosis ([place])   . GERD (gastroesophageal reflux disease)   . Renal insufficiency   . Alcoholism ([place])   . 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 Rhabdomyolysis 01/2005  . Colon polyp perhaps in 1990s    pathology/type not known  . Diverticulosis of colon perhaps in 1990s  . Ascites 11/2004    "early" SBP on paracentesis.   . Portal hypertensive gastropathy 2006  . Fracture of wrist 2014 spring vs early summer    refused orthopods suggestion to set the bone.    Past Surgical History  Procedure Laterality Date  . Inguinal hernia repair    . Melanoma excision Left 02/2011    malignant melanoma of thigh  . Basal cell carcinoma excision Left 11/2012    lower eyelid  . Esophagogastroduodenoscopy N/A 08/04/2013   Procedure: ESOPHAGOGASTRODUODENOSCOPY (EGD);  Surgeon: Ladene Artist, MD;  Location: [facility] ENDOSCOPY;  Service: Endoscopy;  Laterality: N/A;    reports that he has never smoked. He has never used smokeless tobacco. He reports that he does not drink alcohol or use illicit drugs. family history includes Cancer (age of onset: 61) in his father; Heart disease in his mother and sister. No Known Allergies Current Outpatient Prescriptions on File Prior to Visit  Medication Sig Dispense Refill  . acetaminophen (TYLENOL) 325 MG tablet Take 650 mg by mouth every 6 (six) hours as needed for pain.    Marland Kitchen amLODipine (NORVASC) 10 MG tablet TAKE ONE TABLET BY MOUTH ONCE DAILY 90 tablet 3  . DEXILANT 60 MG capsule TAKE ONE CAPSULE EACH DAY 90 capsule 1  . furosemide (LASIX) 40 MG tablet Take 1 tablet (40 mg total) by mouth daily. 30 tablet 11  . metoprolol succinate (TOPROL-XL) 100 MG 24 hr tablet TAKE ONE AND ONE-HALF TABLETS DAILY WITHOR IMMEDIATELY FOLLOWING A MEAL 45 tablet 2  . Multiple Vitamin (MULTIVITAMIN WITH MINERALS) TABS Take 1 tablet by mouth daily.    . simvastatin (ZOCOR) 20 MG tablet Take 20 mg by mouth daily at 6 PM.    . spironolactone (ALDACTONE) 25 MG tablet TAKE ONE TABLET EACH DAY 90 tablet 1  . zolpidem (AMBIEN) 10 MG tablet Take 1 tablet (10 mg total) by mouth at bedtime  as needed for sleep. 30 tablet 5   No current facility-administered medications on file prior to visit.    Review of Systems  Constitutional: Negative for unusual diaphoresis or night sweats HENT: Negative for ringing in ear or discharge Eyes: Negative for double vision or worsening visual disturbance.  Respiratory: Negative for choking and stridor.   Gastrointestinal: Negative for vomiting or other signifcant bowel change Genitourinary: Negative for hematuria or change in urine volume.  Musculoskeletal: Negative for other MSK pain or swelling Skin: Negative for color change and worsening wound.  Neurological:  Negative for tremors and numbness other than noted  Psychiatric/Behavioral: Negative for decreased concentration or agitation other than above       Objective:   Physical Exam BP 138/64 mmHg  Pulse 79  Temp(Src) 98.1 F (36.7 C) (Oral)  Ht 5\' 9"  (1.753 m)  Wt 162 lb (73.483 kg)  BMI 23.91 kg/m2  SpO2 96% VS noted,  Constitutional: Pt appears in no significant distress HENT: Head: NCAT.  Right Ear: External ear normal.  Left Ear: External ear normal.  Eyes: . Pupils are equal, round, and reactive to light. Conjunctivae and EOM are normal Neck: Normal range of motion. Neck supple.  Cardiovascular: Normal rate and regular rhythm.   Pulmonary/Chest: Effort normal and breath sounds without wheezing but signficant bibas rales I think worse from last exam  Abd:  Soft, NT, ND, + BS Neurological: Pt is alert. + mild confused , motor grossly intact Skin: Skin is warm. No rash, no LE edema Psychiatric: Pt behavior is normal. No agitation.     Assessment & Plan:

## 2015-10-04 NOTE — Progress Notes (Signed)
Pre visit review using our clinic review tool, if applicable. No additional management support is needed unless otherwise documented below in the visit note. 

## 2015-10-04 NOTE — Assessment & Plan Note (Addendum)
I suspect pulm related, possibly some kind of pulm fibrotic disease, Has no fever or other s/s of volume overload, declines inhaler trial, but will accept labs as ordered, cxr, PFT's and Pulm referral; consider ct chest  Note:  Total time for pt hx, exam, review of record with pt in the room, determination of diagnoses and plan for further eval and tx is > 40 min, with over 50% spent in coordination and counseling of patient

## 2015-10-04 NOTE — Assessment & Plan Note (Signed)
Also suspect some worsening memory dysfxn, d/w pt regarding ? Mild cognitive impairment, declines neurology referral or tx for now

## 2015-10-04 NOTE — Assessment & Plan Note (Signed)
stable overall by history and exam, recent data reviewed with pt, and pt to continue medical treatment as before,  to f/u any worsening symptoms or concerns BP Readings from Last 3 Encounters:  10/04/15 138/64  08/27/15 126/68  07/22/15 130/60

## 2015-10-08 ENCOUNTER — Ambulatory Visit: Payer: Self-pay | Admitting: Internal Medicine

## 2015-10-08 ENCOUNTER — Ambulatory Visit: Payer: Medicare Other | Admitting: Internal Medicine

## 2015-10-10 ENCOUNTER — Ambulatory Visit: Payer: Self-pay | Admitting: Internal Medicine

## 2015-10-11 ENCOUNTER — Ambulatory Visit (INDEPENDENT_AMBULATORY_CARE_PROVIDER_SITE_OTHER): Payer: Medicare Other | Admitting: Internal Medicine

## 2015-10-11 ENCOUNTER — Encounter: Payer: Self-pay | Admitting: Internal Medicine

## 2015-10-11 VITALS — BP 110/64 | HR 72 | Temp 98.0°F | Ht 68.0 in | Wt 157.0 lb

## 2015-10-11 DIAGNOSIS — R0609 Other forms of dyspnea: Secondary | ICD-10-CM | POA: Diagnosis not present

## 2015-10-11 DIAGNOSIS — R413 Other amnesia: Secondary | ICD-10-CM

## 2015-10-11 DIAGNOSIS — N183 Chronic kidney disease, stage 3 unspecified: Secondary | ICD-10-CM

## 2015-10-11 DIAGNOSIS — I10 Essential (primary) hypertension: Secondary | ICD-10-CM | POA: Diagnosis not present

## 2015-10-11 HISTORY — DX: Chronic kidney disease, stage 3 unspecified: N18.30

## 2015-10-11 NOTE — Progress Notes (Signed)
Pre visit review using our clinic review tool, if applicable. No additional management support is needed unless otherwise documented below in the visit note. 

## 2015-10-11 NOTE — Patient Instructions (Signed)
OK to take the lasix (furosemide) at 40 mg per day, as before  Please continue all other medications as before, and refills have been done if requested.  Please have the pharmacy call with any other refills you may need.  Please continue your efforts at being more active, low cholesterol diet, and weight control.  You will be contacted regarding the referral for: Nephrology   Please keep your appointments with your specialists as you may have planned

## 2015-10-11 NOTE — Progress Notes (Signed)
Subjective:    Patient ID: [name], male    DOB: [birth year], 81 y.o.   MRN: [id number]  HPI  Here to f/u; overall doing ok, but has some short term memory loss he denies.  Cannot recall that he had sob last visit.  Pt now denies chest pain, increasing sob or doe, wheezing, orthopnea, PND, increased LE swelling, palpitations, dizziness or syncope.   Wt down 5 lbs with bid lasix 40 mg.   Pt denies new neurological symptoms such as new headache, or facial or extremity weakness or numbness.  Pt denies polydipsia, polyuria,   Pt states overall good compliance with meds, mostly trying to follow appropriate diet, with wt overall stable,  but little exercise however. Last echo sept 206 with normal EF, gr 1 diast dysfxn, also has ongoing CKD - stage 3. Wt Readings from Last 3 Encounters:  10/11/15 157 lb (71.215 kg)  10/04/15 162 lb (73.483 kg)  08/27/15 160 lb (72.576 kg)   Past Medical History  Diagnosis Date  . Hypertrophic cardiomyopathy ([place])   . HTN (hypertension)   . HLD (hyperlipidemia)   . Alcoholic cirrhosis ([place])   . GERD (gastroesophageal reflux disease)   . Renal insufficiency   . Alcoholism ([place])   . 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 Rhabdomyolysis 01/2005  . Colon polyp perhaps in 1990s    pathology/type not known  . Diverticulosis of colon perhaps in 1990s  . Ascites 11/2004    "early" SBP on paracentesis.   . Portal hypertensive gastropathy 2006  . Fracture of wrist 2014 spring vs early summer    refused orthopods suggestion to set the bone.    Past Surgical History  Procedure Laterality Date  . Inguinal hernia repair    . Melanoma excision Left 02/2011    malignant melanoma of thigh  . Basal cell carcinoma excision Left 11/2012    lower eyelid  . Esophagogastroduodenoscopy N/A 08/04/2013    Procedure: ESOPHAGOGASTRODUODENOSCOPY (EGD);  Surgeon: Ladene Artist, MD;  Location: [facility] ENDOSCOPY;  Service: Endoscopy;  Laterality: N/A;    reports that he has  never smoked. He has never used smokeless tobacco. He reports that he does not drink alcohol or use illicit drugs. family history includes Cancer (age of onset: 28) in his father; Heart disease in his mother and sister. No Known Allergies Current Outpatient Prescriptions on File Prior to Visit  Medication Sig Dispense Refill  . acetaminophen (TYLENOL) 325 MG tablet Take 650 mg by mouth every 6 (six) hours as needed for pain.    Marland Kitchen amLODipine (NORVASC) 10 MG tablet TAKE ONE TABLET BY MOUTH ONCE DAILY 90 tablet 3  . DEXILANT 60 MG capsule TAKE ONE CAPSULE EACH DAY 90 capsule 1  . furosemide (LASIX) 40 MG tablet Take 1 tablet (40 mg total) by mouth daily. 30 tablet 11  . metoprolol succinate (TOPROL-XL) 100 MG 24 hr tablet TAKE ONE AND ONE-HALF TABLETS DAILY WITHOR IMMEDIATELY FOLLOWING A MEAL 45 tablet 2  . Multiple Vitamin (MULTIVITAMIN WITH MINERALS) TABS Take 1 tablet by mouth daily.    . simvastatin (ZOCOR) 20 MG tablet Take 20 mg by mouth daily at 6 PM.    . spironolactone (ALDACTONE) 25 MG tablet TAKE ONE TABLET EACH DAY 90 tablet 1  . zolpidem (AMBIEN) 10 MG tablet Take 1 tablet (10 mg total) by mouth at bedtime as needed for sleep. 30 tablet 5   No current facility-administered medications on file prior to visit.  Review of Systems  Constitutional: Negative for unusual diaphoresis or night sweats HENT: Negative for ringing in ear or discharge Eyes: Negative for double vision or worsening visual disturbance.  Respiratory: Negative for choking and stridor.   Gastrointestinal: Negative for vomiting or other signifcant bowel change Genitourinary: Negative for hematuria or change in urine volume.  Musculoskeletal: Negative for other MSK pain or swelling Skin: Negative for color change and worsening wound.  Neurological: Negative for tremors and numbness other than noted  Psychiatric/Behavioral: Negative for decreased concentration or agitation other than above       Objective:    Physical Exam BP 110/64 mmHg  Pulse 72  Temp(Src) 98 F (36.7 C) (Oral)  Ht 5\' 8"  (1.727 m)  Wt 157 lb (71.215 kg)  BMI 23.88 kg/m2  SpO2 95% VS noted, not ill appearing Constitutional: Pt appears in no significant distress HENT: Head: NCAT.  Right Ear: External ear normal.  Left Ear: External ear normal.  Eyes: . Pupils are equal, round, and reactive to light. Conjunctivae and EOM are normal Neck: Normal range of motion. Neck supple.  Cardiovascular: Normal rate and regular rhythm.   Pulmonary/Chest: Effort normal and breath sounds without rales or wheezing.  Abd:  Soft, NT, ND, + BS Neurological: Pt is alert. + mild confused but denies , motor grossly intact Skin: Skin is warm. No rash, no LE edema Psychiatric: Pt behavior is normal. No agitation.             [facility]            [address], [place] [address]              [phone]  ------------------------------------------------------------------- Transthoracic Echocardiography  Patient:  [name] MR #:    [id number] Study Date: 07/26/2015 Gender:   M Age:    67 Height:   175.3 cm Weight:   73.5 kg BSA:    1.9 m^2 Pt. Status: Room:  ATTENDING  Jenkins Rouge, M.D. SONOGRAPHER Cindy Hazy, RDCS ORDERING   Servando Snare, Marlane Hatcher REFERRING  Truitt Merle C PERFORMING  [facility], Outpatient  cc:  ------------------------------------------------------------------- LV EF: 55% -  60%  ------------------------------------------------------------------- Indications:   R06.00 Dyspnea.  ------------------------------------------------------------------- History:  PMH: Acquired from the patient and from the patient&'s chart. PMH: Alcoholic Cirrhosis. Ascites. Renal Insufficiency. HOCM. Portal Hypertension Gastrophy. Risk factors: Alcohol Abuse. Hypertension.  Dyslipidemia.  ------------------------------------------------------------------- Study Conclusions  - Left ventricle: Moderate basal septal hypertrophy no SAM or evidnece of HOCM. The cavity size was normal. Systolic function was normal. The estimated ejection fraction was in the range of 55% to 60%. Wall motion was normal; there were no regional wall motion abnormalities. Doppler parameters are consistent with abnormal left ventricular relaxation (grade 1 diastolic dysfunction). - Aortic valve: There was mild regurgitation. - Mitral valve: Calcified annulus. Mildly thickened leaflets . - Left atrium: The atrium was moderately dilated. - Atrial septum: No defect or patent foramen ovale was identified.    Assessment & Plan:

## 2015-10-11 NOTE — Assessment & Plan Note (Signed)
stable overall by history and exam, recent data reviewed with pt, and pt to continue medical treatment as before,  to f/u any worsening symptoms or concerns BP Readings from Last 3 Encounters:  10/11/15 110/64  10/04/15 138/64  08/27/15 126/68

## 2015-10-11 NOTE — Assessment & Plan Note (Signed)
Resolved with increased lasix and 5 lb wt loss since last seen, ok to resume lasix 40 qd, cont to monitor, suspect mild volume overload likely cardiorenal with elements diast dysfxn and CKD

## 2015-10-11 NOTE — Assessment & Plan Note (Signed)
Also for renal referral,  to f/u any worsening symptoms or concerns

## 2015-10-11 NOTE — Assessment & Plan Note (Signed)
?   Worsening last 6-12 mo - pt denies, declines further eval or tx

## 2015-10-23 ENCOUNTER — Telehealth: Payer: Self-pay | Admitting: Internal Medicine

## 2015-10-23 ENCOUNTER — Other Ambulatory Visit (INDEPENDENT_AMBULATORY_CARE_PROVIDER_SITE_OTHER): Payer: Medicare Other

## 2015-10-23 ENCOUNTER — Ambulatory Visit (INDEPENDENT_AMBULATORY_CARE_PROVIDER_SITE_OTHER): Payer: Medicare Other | Admitting: Internal Medicine

## 2015-10-23 ENCOUNTER — Encounter: Payer: Self-pay | Admitting: Internal Medicine

## 2015-10-23 VITALS — BP 130/56 | HR 101 | Temp 97.9°F | Resp 16 | Ht 68.0 in | Wt 158.0 lb

## 2015-10-23 DIAGNOSIS — R601 Generalized edema: Secondary | ICD-10-CM

## 2015-10-23 DIAGNOSIS — R002 Palpitations: Secondary | ICD-10-CM

## 2015-10-23 DIAGNOSIS — Z8719 Personal history of other diseases of the digestive system: Secondary | ICD-10-CM

## 2015-10-23 DIAGNOSIS — Z8711 Personal history of peptic ulcer disease: Secondary | ICD-10-CM

## 2015-10-23 DIAGNOSIS — R0609 Other forms of dyspnea: Secondary | ICD-10-CM

## 2015-10-23 LAB — CBC WITH DIFFERENTIAL/PLATELET
Basophils Absolute: 0 10*3/uL (ref 0.0–0.1)
Basophils Relative: 0.4 % (ref 0.0–3.0)
EOS ABS: 0.2 10*3/uL (ref 0.0–0.7)
EOS PCT: 2.9 % (ref 0.0–5.0)
HEMATOCRIT: 37.3 % — AB (ref 39.0–52.0)
HEMOGLOBIN: 12.5 g/dL — AB (ref 13.0–17.0)
LYMPHS PCT: 9.4 % — AB (ref 12.0–46.0)
Lymphs Abs: 0.8 10*3/uL (ref 0.7–4.0)
MCHC: 33.5 g/dL (ref 30.0–36.0)
MCV: 95 fl (ref 78.0–100.0)
MONO ABS: 0.8 10*3/uL (ref 0.1–1.0)
Monocytes Relative: 9.4 % (ref 3.0–12.0)
Neutro Abs: 6.5 10*3/uL (ref 1.4–7.7)
Neutrophils Relative %: 77.9 % — ABNORMAL HIGH (ref 43.0–77.0)
Platelets: 208 10*3/uL (ref 150.0–400.0)
RBC: 3.93 Mil/uL — AB (ref 4.22–5.81)
RDW: 13.4 % (ref 11.5–15.5)
WBC: 8.3 10*3/uL (ref 4.0–10.5)

## 2015-10-23 LAB — TSH: TSH: 0.74 u[IU]/mL (ref 0.35–4.50)

## 2015-10-23 NOTE — Patient Instructions (Signed)
  Your next office appointment will be determined based upon review of your pending labs  and  xrays  Those written interpretation of the lab results and instructions will be transmitted to you by mail for your records.  Critical results will be called.   Followup as needed for any active or acute issue. Please report any significant change in your symptoms. 

## 2015-10-23 NOTE — Telephone Encounter (Signed)
No ; might have to be stuck twice

## 2015-10-23 NOTE — Telephone Encounter (Signed)
Pt is coming in to see you today to recheck an ulcer. He is wanting to know if you want him to do labs ahead of time. Please advise

## 2015-10-23 NOTE — Telephone Encounter (Signed)
Pt informed

## 2015-10-23 NOTE — Progress Notes (Signed)
Pre visit review using our clinic review tool, if applicable. No additional management support is needed unless otherwise documented below in the visit note. 

## 2015-10-23 NOTE — Progress Notes (Signed)
   Subjective:    Patient ID: [name], male    DOB: August 26, 1929, 81 y.o.   MRN: [id number]  HPI He describes exertional dyspnea over the last several weeks associated with palpitations; he is concerned that he might have a recurrence of gastric ulcer. Remotely he was hospitalized for this and was found to be profoundly anemic. After transfusions hemoglobin rose to 11.   Review of Systems Epistaxis, hemoptysis, hematuria, melena, or rectal bleeding denied. No unexplained weight loss, significant dyspepsia,dysphagia, or abdominal pain.  There is no abnormal bruising , bleeding, or difficulty stopping bleeding with injury. Chest pain, tachycardia,  paroxysmal nocturnal dyspnea, or claudication are absent.      Objective:   Physical Exam  Pertinent or positive findings include:Pattern alopecia is present. Gait is slow and broad. He has a slight tachycardia with grade 1. 5-2 systolic murmur with a diastolic rumble. He has diffuse mild rales. He has 1+ edema to the midshin. The right wrist is deformed  General appearance :adequately nourished; in no distress.  Eyes: No conjunctival inflammation or scleral icterus is present.  Oral exam:  Lips and gums are healthy appearing.There is no oropharyngeal erythema or exudate noted. Dental hygiene is good.  Heart:   regular rhythm. S1 and S2 normal without gallop,  click, rub or other extra sounds    Lungs:No increased work of breathing.   Abdomen: bowel sounds normal, soft and non-tender without masses, organomegaly or hernias noted.  No guarding or rebound.   Vascular : all pulses equal ; no bruits present.  Skin:Warm & dry.  Intact without suspicious lesions or rashes ; no tenting or jaundice   Lymphatic: No lymphadenopathy is noted about the head, neck, axilla.   Neuro: Strength, tone markedly decreased      Assessment & Plan:  #1 DOE  #2 murmur #3 edema #4 PMH gastric ulcer See orders

## 2015-10-24 ENCOUNTER — Other Ambulatory Visit: Payer: Self-pay | Admitting: Internal Medicine

## 2015-10-24 DIAGNOSIS — R0609 Other forms of dyspnea: Principal | ICD-10-CM

## 2015-10-24 DIAGNOSIS — R000 Tachycardia, unspecified: Secondary | ICD-10-CM

## 2015-10-24 DIAGNOSIS — R600 Localized edema: Secondary | ICD-10-CM

## 2015-10-24 LAB — BASIC METABOLIC PANEL
BUN: 40 mg/dL — AB (ref 6–23)
CO2: 21 mEq/L (ref 19–32)
CREATININE: 2.01 mg/dL — AB (ref 0.40–1.50)
Calcium: 8.8 mg/dL (ref 8.4–10.5)
Chloride: 108 mEq/L (ref 96–112)
GFR: 33.77 mL/min — AB (ref >60.00)
Glucose, Bld: 117 mg/dL — ABNORMAL HIGH (ref 70–99)
POTASSIUM: 5.2 meq/L — AB (ref 3.5–5.1)
Sodium: 137 mEq/L (ref 135–145)

## 2015-10-24 LAB — HEPATIC FUNCTION PANEL
ALK PHOS: 89 U/L (ref 39–117)
ALT: 13 U/L (ref 0–53)
AST: 19 U/L (ref 0–37)
Albumin: 3.9 g/dL (ref 3.5–5.2)
BILIRUBIN DIRECT: 0 mg/dL (ref 0.0–0.3)
BILIRUBIN TOTAL: 0.4 mg/dL (ref 0.2–1.2)
Total Protein: 7.3 g/dL (ref 6.0–8.3)

## 2015-10-28 ENCOUNTER — Encounter: Payer: Self-pay | Admitting: Pulmonary Disease

## 2015-10-28 ENCOUNTER — Ambulatory Visit (INDEPENDENT_AMBULATORY_CARE_PROVIDER_SITE_OTHER): Payer: Medicare Other | Admitting: Pulmonary Disease

## 2015-10-28 VITALS — BP 132/64 | HR 71 | Ht 66.0 in | Wt 150.8 lb

## 2015-10-28 DIAGNOSIS — R0600 Dyspnea, unspecified: Secondary | ICD-10-CM | POA: Diagnosis not present

## 2015-10-28 NOTE — Progress Notes (Signed)
Subjective:    Patient ID: [name], male    DOB: 08-Apr-1929, 81 y.o.   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 He has been referred to [place] for evaluation of dyspnea on exertion. He has mild shortness of breath when he exerts himself however he feels that this is not out of the normal. He feels that symptoms are not excessive and on par for a man his age. He is able to accomplish most of his activities of daily living without any problem. He denies any cough, sputum production, wheezing, hemoptysis.  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 She smoked a pipe for a few years in college otherwise he does not have any significant smoking history or exposure to secondhand smoke. He does not have any significant exposures at work or at home.  DATA: CXR (10/04/15) Cardiomegaly with mild pulmonary interstitial prominence suggesting mild congestive heart failure. Interstitial pneumonitis cannot be excluded.  Echo (07/25/25) -Left ventricle: Moderate basal septal hypertrophy no SAM or evidnece of HOCM. The cavity size was normal. Systolic function was normal. The estimated ejection fraction was in the range of 55% to 60%. Wall motion was normal; there were no regional wall motion abnormalities. Doppler parameters are consistent with abnormal left ventricular relaxation (grade 1 diastolic dysfunction). - Aortic valve: There was mild regurgitation. - Mitral valve: Calcified annulus. Mildly thickened leaflets . - Left atrium: The atrium was moderately dilated. - Atrial septum: No defect or patent foramen ovale was identified.  Past Medical History  Diagnosis Date  . Hypertrophic cardiomyopathy ([name])   . HTN (hypertension)   . HLD (hyperlipidemia)   . Alcoholic cirrhosis ([name])   . GERD (gastroesophageal reflux disease)     . Renal insufficiency   . Alcoholism ([name])   . Alcoholic encephalopathy ([name]) 01/2005  . Rhabdomyolysis 01/2005  . Colon polyp perhaps in 1990s    pathology/type not known  . Diverticulosis of colon perhaps in 1990s  . Ascites 11/2004    "early" SBP on paracentesis.   . Portal hypertensive gastropathy 2006  . Fracture of wrist 2014 spring vs early summer    refused orthopods suggestion to set the bone.   . CKD (chronic kidney disease), stage III 10/11/2015    Current outpatient prescriptions:  .  acetaminophen (TYLENOL) 325 MG tablet, Take 650 mg by mouth every 6 (six) hours as needed for pain., Disp: , Rfl:  .  amLODipine (NORVASC) 10 MG tablet, TAKE ONE TABLET BY MOUTH ONCE DAILY, Disp: 90 tablet, Rfl: 3 .  DEXILANT 60 MG capsule, TAKE ONE CAPSULE EACH DAY, Disp: 90 capsule, Rfl: 1 .  furosemide (LASIX) 40 MG tablet, Take 1 tablet (40 mg total) by mouth daily., Disp: 30 tablet, Rfl: 11 .  metoprolol succinate (TOPROL-XL) 100 MG 24 hr tablet, TAKE ONE AND ONE-HALF TABLETS DAILY WITHOR IMMEDIATELY FOLLOWING A MEAL, Disp: 45 tablet, Rfl: 2 .  Multiple Vitamin (MULTIVITAMIN WITH MINERALS) TABS, Take 1 tablet by mouth daily., Disp: , Rfl:  .  simvastatin (ZOCOR) 20 MG tablet, Take 20 mg by mouth daily at 6 PM., Disp: , Rfl:  .  spironolactone (ALDACTONE) 25 MG tablet, TAKE ONE TABLET EACH DAY, Disp: 90 tablet, Rfl: 1 .  zolpidem (AMBIEN) 10 MG tablet, Take 1 tablet (10 mg total) by mouth at bedtime as needed for sleep.,  Disp: 30 tablet, Rfl: 5   Review of Systems Mild dyspnea on exertion, denies any cough, sputum production, wheezing, hemoptysis. Denies any chest pain, palpitations. Denies any fevers, chills, malaise, fatigue. History nausea, vomiting, diarrhea, constipation. All other review of systems negative.    Objective:   Physical Exam  Blood pressure 132/64, pulse 71, height 5\' 6"  (1.676 m), weight 150 lb 12.8 oz (68.402 kg), SpO2 97 %.  Gen.:  No apparent distress Neuro:  No gross focal deficits. Neck: No JVD, lymphadenopathy, thyromegaly. RS: Clear, no wheeze, crackles, non-labored breathing. CVS: S1-S2 heard, no murmurs rubs gallops. Abdomen: Soft, positive bowel sounds. Extremities: 2+ edema.     Assessment & Plan:  Dyspnea on exertion.  There is no obvious pulmonary etiology for his dyspnea on exertion. This is more likely secondary to his CHF, pulmonary vascular congestion. His Lasix dose has been upped recently however he continues to have significant lower extremity edema. He denies that his dyspnea on exertion is excessive or is causing any impairment. He does not have any significant smoking history to suggest emphysema. I have offered to get a set of pulmonary function tests for better evaluation of his lung function however he has declined.  He will call us in case his symptoms worsen or he has decided to go ahead with the primary function tests. I do not see any indication for inhalers at this point.   Return to clinic as needed.  Marshell Garfinkel MD [facility] Pager [phone] If no answer or after 3pm call: [phone] 10/28/2015, 3:35 PM

## 2015-10-28 NOTE — Patient Instructions (Signed)
Please call us back for an appointment if your symptoms worsen.

## 2015-11-26 ENCOUNTER — Other Ambulatory Visit: Payer: Self-pay | Admitting: Internal Medicine

## 2015-11-26 NOTE — Telephone Encounter (Signed)
Please consider OV, as I cannot tell from infromatoin available why an antibiotic is requested, any MD ok

## 2015-11-26 NOTE — Telephone Encounter (Signed)
Please advise, thanks.

## 2015-11-27 NOTE — Telephone Encounter (Signed)
Called patient to let him know that Dr. [name] advised he needed an office visit before he would refill the medication. Patient denied office visit due to not wanting to come in.

## 2015-12-04 ENCOUNTER — Ambulatory Visit: Payer: Self-pay | Admitting: Internal Medicine

## 2015-12-23 ENCOUNTER — Other Ambulatory Visit: Payer: Self-pay | Admitting: Internal Medicine

## 2016-01-06 ENCOUNTER — Other Ambulatory Visit: Payer: Self-pay | Admitting: Internal Medicine

## 2016-01-06 NOTE — Telephone Encounter (Signed)
Please advise, as Dr. [name] is out of the office this week

## 2016-01-22 ENCOUNTER — Other Ambulatory Visit: Payer: Self-pay

## 2016-01-22 ENCOUNTER — Other Ambulatory Visit: Payer: Self-pay | Admitting: Cardiovascular Disease

## 2016-01-22 DIAGNOSIS — R600 Localized edema: Secondary | ICD-10-CM

## 2016-01-22 MED ORDER — FUROSEMIDE 40 MG PO TABS: 40.0000 mg | ORAL_TABLET | Freq: Every day | ORAL | Status: DC

## 2016-01-25 ENCOUNTER — Other Ambulatory Visit: Payer: Self-pay | Admitting: Cardiovascular Disease

## 2016-01-25 ENCOUNTER — Other Ambulatory Visit: Payer: Self-pay | Admitting: Internal Medicine

## 2016-01-28 NOTE — Telephone Encounter (Signed)
Done hardcopy to Corinne  

## 2016-01-28 NOTE — Telephone Encounter (Signed)
Medication sent to pharmacy  

## 2016-02-05 ENCOUNTER — Other Ambulatory Visit: Payer: Self-pay | Admitting: Nephrology

## 2016-02-05 DIAGNOSIS — N189 Chronic kidney disease, unspecified: Secondary | ICD-10-CM

## 2016-02-06 ENCOUNTER — Ambulatory Visit
Admission: RE | Admit: 2016-02-06 | Discharge: 2016-02-06 | Disposition: A | Discharge status: Home / Self Care | Payer: Medicare Other | Source: Ambulatory Visit | Attending: Nephrology | Admitting: Nephrology

## 2016-02-06 ENCOUNTER — Encounter: Payer: Self-pay | Admitting: Internal Medicine

## 2016-02-06 DIAGNOSIS — N189 Chronic kidney disease, unspecified: Secondary | ICD-10-CM

## 2016-02-12 ENCOUNTER — Other Ambulatory Visit: Payer: Self-pay | Admitting: Internal Medicine

## 2016-02-13 ENCOUNTER — Encounter: Payer: Self-pay | Admitting: Cardiovascular Disease

## 2016-02-13 ENCOUNTER — Ambulatory Visit (INDEPENDENT_AMBULATORY_CARE_PROVIDER_SITE_OTHER): Payer: Medicare Other | Admitting: Cardiovascular Disease

## 2016-02-13 VITALS — BP 134/60 | HR 90 | Ht 70.0 in | Wt 162.6 lb

## 2016-02-13 DIAGNOSIS — I10 Essential (primary) hypertension: Secondary | ICD-10-CM | POA: Diagnosis not present

## 2016-02-13 DIAGNOSIS — R600 Localized edema: Secondary | ICD-10-CM | POA: Diagnosis not present

## 2016-02-13 DIAGNOSIS — E785 Hyperlipidemia, unspecified: Secondary | ICD-10-CM

## 2016-02-13 DIAGNOSIS — I421 Obstructive hypertrophic cardiomyopathy: Secondary | ICD-10-CM

## 2016-02-13 NOTE — Progress Notes (Signed)
Chief Complaint  Patient presents with  . Follow-up  . Shortness of Breath    while active and walking    History of Present Illness: Dr. Torrente is a 82 yo male with history of hypertrophic cardiomyopathy, HTN, HLD, alcohol abuse here today for cardiac follow up. He has been followed in the past by Dr. Lia Foyer. Last echo 07/26/15 with moderate basal septal hypertrophy with no clear HOCM physiology, normal LV function. He is a retired Industrial/product designer.   He is here today for cardiac follow up. He has had no chest pain. He has occasional dyspnea but unchanged. Still with LE edema. Taking lasix 40 mg daily.   Primary Care Physician: [name], MD  Past Medical History  Diagnosis Date  . Hypertrophic cardiomyopathy ([place])   . HTN (hypertension)   . HLD (hyperlipidemia)   . Alcoholic cirrhosis ([place])   . GERD (gastroesophageal reflux disease)   . Renal insufficiency   . Alcoholism ([place])   . 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 Rhabdomyolysis 01/2005  . Colon polyp perhaps in 1990s    pathology/type not known  . Diverticulosis of colon perhaps in 1990s  . Ascites 11/2004    "early" SBP on paracentesis.   . Portal hypertensive gastropathy 2006  . Fracture of wrist 2014 spring vs early summer    refused orthopods suggestion to set the bone.   . CKD (chronic kidney disease), stage III 10/11/2015    Past Surgical History  Procedure Laterality Date  . Inguinal hernia repair    . Melanoma excision Left 02/2011    malignant melanoma of thigh  . Basal cell carcinoma excision Left 11/2012    lower eyelid  . Esophagogastroduodenoscopy N/A 08/04/2013    Procedure: ESOPHAGOGASTRODUODENOSCOPY (EGD);  Surgeon: Ladene Artist, MD;  Location: [facility] ENDOSCOPY;  Service: Endoscopy;  Laterality: N/A;    Current Outpatient Prescriptions  Medication Sig Dispense Refill  . acetaminophen (TYLENOL) 325 MG tablet Take 650 mg by mouth every 6 (six) hours as needed for pain.    Marland Kitchen amLODipine (NORVASC) 10 MG  tablet Take 1 tablet (10 mg total) by mouth daily. 90 tablet 1  . DEXILANT 60 MG capsule TAKE ONE CAPSULE EACH DAY 90 capsule 1  . furosemide (LASIX) 40 MG tablet Take 1 tablet (40 mg total) by mouth daily. 30 tablet 0  . metoprolol succinate (TOPROL-XL) 100 MG 24 hr tablet TAKE ONE AND ONE-HALF TABLETS DAILY WITHOR IMMEDIATELY FOLLOWING A MEAL 45 tablet 5  . Multiple Vitamin (MULTIVITAMIN WITH MINERALS) TABS Take 1 tablet by mouth daily.    . simvastatin (ZOCOR) 20 MG tablet Take 20 mg by mouth daily at 6 PM.    . spironolactone (ALDACTONE) 25 MG tablet TAKE ONE TABLET EACH DAY 90 tablet 0  . zolpidem (AMBIEN) 5 MG tablet TAKE ONE TABLET AT BEDTIME AS NEEDED FORSLEEP 30 tablet 3   No current facility-administered medications for this visit.    No Known Allergies  Social History   Social History  . Marital Status: Married    Spouse Name: N/A  . Number of Children: 4  . Years of Education: 24   Occupational History  . pathologist     retired   Social History Main Topics  . Smoking status: Never Smoker   . Smokeless tobacco: Never Used  . Alcohol Use: No     Comment: member of AA  . Drug Use: No  . Sexual Activity: Not on file   Other Topics Concern  .  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  Remains independent. ACP- not discussed, will approach at next OV    Family History  Problem Relation Age of Onset  . Cancer Father 26    lung cancer  . Heart disease Mother   . Heart disease Sister     Review of Systems:  As stated in the HPI and otherwise negative.   BP 134/60 mmHg  Pulse 90  Ht 5\' 10"  (1.778 m)  Wt 162 lb 9.6 oz (73.755 kg)  BMI 23.33 kg/m2  SpO2 93%  Physical Examination: General: Well developed, well nourished, NAD HEENT: OP clear, mucus membranes moist SKIN: warm, dry. No rashes. Neuro: No focal deficits Musculoskeletal: Muscle strength 5/5 all ext Psychiatric: Mood and affect normal Neck:  No JVD, no carotid bruits, no thyromegaly, no lymphadenopathy. Lungs: Basilar crackles bilaterally. No wheezes, rhonci, crackles Cardiovascular: Regular rate and rhythm. No murmurs, gallops or rubs. Abdomen:Soft. Bowel sounds present. Non-tender.  Extremities: 2+ bilateral lower extremity edema. Pulses are 2 + in the bilateral DP/PT.  Echo 07/26/15: Left ventricle: Moderate basal septal hypertrophy no SAM or  evidence of HOCM. The cavity size was normal. Systolic function  was normal. The estimated ejection fraction was in the range of  55% to 60%. Wall motion was normal; there were no regional wall  motion abnormalities. Doppler parameters are consistent with  abnormal left ventricular relaxation (grade 1 diastolic  dysfunction). - Aortic valve: There was mild regurgitation. - Mitral valve: Calcified annulus. Mildly thickened leaflets . - Left atrium: The atrium was moderately dilated. - Atrial septum: No defect or patent foramen ovale was identified.  EKG:  EKG is not ordered today. The ekg ordered today demonstrates   Recent Labs: 07/22/2015: Pro B Natriuretic peptide (BNP) 61.0 10/23/2015: ALT 13; BUN 40*; Creatinine, Ser 2.01*; Hemoglobin 12.5*; Platelets 208.0; Potassium 5.2*; Sodium 137; TSH 0.74   Lipid Panel    Component Value Date/Time   CHOL 106 02/22/2015 1105   TRIG 138.0 02/22/2015 1105   HDL 45.60 02/22/2015 1105   CHOLHDL 2 02/22/2015 1105   VLDL 27.6 02/22/2015 1105   LDLCALC 33 02/22/2015 1105     Wt Readings from Last 3 Encounters:  02/13/16 162 lb 9.6 oz (73.755 kg)  10/28/15 150 lb 12.8 oz (68.402 kg)  10/23/15 158 lb (71.668 kg)     Other studies Reviewed: Additional studies/ records that were reviewed today include: . Review of the above records demonstrates:    Assessment and Plan:   1. Hypertrophic cardiomyopathy: No symptoms. Echo as above September 2016 with basal septal hypertrophy but with no physiology of HOCM  2. HTN: BP  controlled. No changes.   3. Lower ext edema, chronic: Likely dependent edema. Still with 2+ edema. Continue Lasix 40 mg daily. Elevate legs during day. Compression stocking.  4. Hyperlipidemia: He is on a statin but does not want his lipids checked.   Current medicines are reviewed at length with the patient today.  The patient does not have concerns regarding medicines.  The following changes have been made:  no change  Labs/ tests ordered today include:  No orders of the defined types were placed in this encounter.    Disposition:   FU with me in 6 months  Signed, Lauree [name], MD 02/13/2016 2:17 PM    [facility] [place], [place], [place]  [address] Phone: [phone]; Fax: [phone]

## 2016-02-13 NOTE — Patient Instructions (Signed)

## 2016-02-25 ENCOUNTER — Ambulatory Visit: Payer: Medicare Other | Admitting: Internal Medicine

## 2016-02-25 DIAGNOSIS — Z0289 Encounter for other administrative examinations: Secondary | ICD-10-CM

## 2016-03-06 ENCOUNTER — Other Ambulatory Visit: Payer: Self-pay | Admitting: Internal Medicine

## 2016-04-09 DIAGNOSIS — K269 Duodenal ulcer, unspecified as acute or chronic, without hemorrhage or perforation: Secondary | ICD-10-CM

## 2016-04-09 HISTORY — DX: Duodenal ulcer, unspecified as acute or chronic, without hemorrhage or perforation: K26.9

## 2016-04-13 ENCOUNTER — Observation Stay (HOSPITAL_COMMUNITY)
Admission: EM | Admit: 2016-04-13 | Discharge: 2016-04-13 | Disposition: A | Discharge status: Home / Self Care | Payer: Medicare Other | Attending: Emergency Medicine | Admitting: Emergency Medicine

## 2016-04-13 ENCOUNTER — Telehealth: Payer: Self-pay | Admitting: Gastroenterology

## 2016-04-13 ENCOUNTER — Encounter (HOSPITAL_COMMUNITY): Payer: Self-pay | Admitting: Emergency Medicine

## 2016-04-13 ENCOUNTER — Emergency Department (HOSPITAL_COMMUNITY): Payer: Medicare Other

## 2016-04-13 DIAGNOSIS — I129 Hypertensive chronic kidney disease with stage 1 through stage 4 chronic kidney disease, or unspecified chronic kidney disease: Secondary | ICD-10-CM | POA: Diagnosis not present

## 2016-04-13 DIAGNOSIS — D649 Anemia, unspecified: Secondary | ICD-10-CM

## 2016-04-13 DIAGNOSIS — K219 Gastro-esophageal reflux disease without esophagitis: Secondary | ICD-10-CM | POA: Insufficient documentation

## 2016-04-13 DIAGNOSIS — Z9119 Patient's noncompliance with other medical treatment and regimen: Secondary | ICD-10-CM | POA: Diagnosis not present

## 2016-04-13 DIAGNOSIS — R195 Other fecal abnormalities: Secondary | ICD-10-CM | POA: Diagnosis not present

## 2016-04-13 DIAGNOSIS — N183 Chronic kidney disease, stage 3 (moderate): Secondary | ICD-10-CM | POA: Diagnosis not present

## 2016-04-13 DIAGNOSIS — Z86010 Personal history of colonic polyps: Secondary | ICD-10-CM | POA: Insufficient documentation

## 2016-04-13 DIAGNOSIS — Z85820 Personal history of malignant melanoma of skin: Secondary | ICD-10-CM | POA: Insufficient documentation

## 2016-04-13 DIAGNOSIS — E785 Hyperlipidemia, unspecified: Secondary | ICD-10-CM | POA: Diagnosis not present

## 2016-04-13 DIAGNOSIS — K921 Melena: Principal | ICD-10-CM | POA: Insufficient documentation

## 2016-04-13 DIAGNOSIS — K7031 Alcoholic cirrhosis of liver with ascites: Secondary | ICD-10-CM | POA: Diagnosis not present

## 2016-04-13 DIAGNOSIS — I422 Other hypertrophic cardiomyopathy: Secondary | ICD-10-CM | POA: Diagnosis not present

## 2016-04-13 DIAGNOSIS — K922 Gastrointestinal hemorrhage, unspecified: Secondary | ICD-10-CM | POA: Diagnosis present

## 2016-04-13 LAB — URINALYSIS, ROUTINE W REFLEX MICROSCOPIC
Bilirubin Urine: NEGATIVE
Glucose, UA: NEGATIVE mg/dL
Hgb urine dipstick: NEGATIVE
KETONES UR: NEGATIVE mg/dL
LEUKOCYTES UA: NEGATIVE
NITRITE: NEGATIVE
PROTEIN: 30 mg/dL — AB
Specific Gravity, Urine: 1.013 (ref 1.005–1.030)
pH: 5 (ref 5.0–8.0)

## 2016-04-13 LAB — I-STAT CHEM 8, ED
BUN: 41 mg/dL — ABNORMAL HIGH (ref 6–20)
CREATININE: 1.9 mg/dL — AB (ref 0.61–1.24)
Calcium, Ion: 1.24 mmol/L (ref 1.13–1.30)
Chloride: 110 mmol/L (ref 101–111)
Glucose, Bld: 131 mg/dL — ABNORMAL HIGH (ref 65–99)
HEMATOCRIT: 33 % — AB (ref 39.0–52.0)
HEMOGLOBIN: 11.2 g/dL — AB (ref 13.0–17.0)
Potassium: 4.6 mmol/L (ref 3.5–5.1)
SODIUM: 143 mmol/L (ref 135–145)
TCO2: 20 mmol/L (ref 0–100)

## 2016-04-13 LAB — APTT: APTT: 31 s (ref 24–37)

## 2016-04-13 LAB — CBC WITH DIFFERENTIAL/PLATELET
BASOS ABS: 0 10*3/uL (ref 0.0–0.1)
BASOS PCT: 0 %
Eosinophils Absolute: 0.1 10*3/uL (ref 0.0–0.7)
Eosinophils Relative: 1 %
HEMATOCRIT: 33.4 % — AB (ref 39.0–52.0)
HEMOGLOBIN: 10.8 g/dL — AB (ref 13.0–17.0)
LYMPHS PCT: 7 %
Lymphs Abs: 0.7 10*3/uL (ref 0.7–4.0)
MCH: 30.9 pg (ref 26.0–34.0)
MCHC: 32.3 g/dL (ref 30.0–36.0)
MCV: 95.7 fL (ref 78.0–100.0)
MONOS PCT: 10 %
Monocytes Absolute: 1 10*3/uL (ref 0.1–1.0)
NEUTROS ABS: 7.5 10*3/uL (ref 1.7–7.7)
NEUTROS PCT: 82 %
Platelets: 230 10*3/uL (ref 150–400)
RBC: 3.49 MIL/uL — ABNORMAL LOW (ref 4.22–5.81)
RDW: 13.6 % (ref 11.5–15.5)
WBC: 9.2 10*3/uL (ref 4.0–10.5)

## 2016-04-13 LAB — COMPREHENSIVE METABOLIC PANEL
ALBUMIN: 3.5 g/dL (ref 3.5–5.0)
ALT: 17 U/L (ref 17–63)
AST: 21 U/L (ref 15–41)
Alkaline Phosphatase: 75 U/L (ref 38–126)
Anion gap: 9 (ref 5–15)
BILIRUBIN TOTAL: 0.7 mg/dL (ref 0.3–1.2)
BUN: 44 mg/dL — AB (ref 6–20)
CHLORIDE: 112 mmol/L — AB (ref 101–111)
CO2: 19 mmol/L — ABNORMAL LOW (ref 22–32)
Calcium: 8.9 mg/dL (ref 8.9–10.3)
Creatinine, Ser: 1.9 mg/dL — ABNORMAL HIGH (ref 0.61–1.24)
GFR calc Af Amer: 36 mL/min — ABNORMAL LOW (ref >60)
GFR calc non Af Amer: 31 mL/min — ABNORMAL LOW (ref >60)
GLUCOSE: 136 mg/dL — AB (ref 65–99)
POTASSIUM: 4.6 mmol/L (ref 3.5–5.1)
Sodium: 140 mmol/L (ref 135–145)
Total Protein: 6.7 g/dL (ref 6.5–8.1)

## 2016-04-13 LAB — URINE MICROSCOPIC-ADD ON
Bacteria, UA: NONE SEEN
WBC UA: NONE SEEN WBC/hpf (ref 0–5)

## 2016-04-13 LAB — I-STAT TROPONIN, ED: Troponin i, poc: 0.08 ng/mL (ref 0.00–0.08)

## 2016-04-13 LAB — TYPE AND SCREEN
ABO/RH(D): AB POS
ANTIBODY SCREEN: NEGATIVE

## 2016-04-13 LAB — PROTIME-INR
INR: 1.21 (ref 0.00–1.49)
Prothrombin Time: 15.5 seconds — ABNORMAL HIGH (ref 11.6–15.2)

## 2016-04-13 LAB — I-STAT CG4 LACTIC ACID, ED: Lactic Acid, Venous: 1.32 mmol/L (ref 0.5–2.0)

## 2016-04-13 LAB — POC OCCULT BLOOD, ED: Fecal Occult Bld: POSITIVE — AB

## 2016-04-13 LAB — BRAIN NATRIURETIC PEPTIDE: B NATRIURETIC PEPTIDE 5: 107.2 pg/mL — AB (ref 0.0–100.0)

## 2016-04-13 MED ORDER — PANTOPRAZOLE SODIUM 40 MG IV SOLR
80.0000 mg | Freq: Once | INTRAVENOUS | Status: AC
  Administered 2016-04-13: 80 mg via INTRAVENOUS
  Filled 2016-04-13: qty 80

## 2016-04-13 MED ORDER — PANTOPRAZOLE SODIUM 40 MG IV SOLR
8.0000 mg/h | INTRAVENOUS | Status: DC
  Administered 2016-04-13: 8 mg/h via INTRAVENOUS
  Filled 2016-04-13 (×3): qty 80

## 2016-04-13 MED ORDER — PANTOPRAZOLE SODIUM 20 MG PO TBEC: 40.0000 mg | DELAYED_RELEASE_TABLET | Freq: Every day | ORAL | Status: DC

## 2016-04-13 MED ORDER — SODIUM CHLORIDE 0.9 % IV SOLN
80.0000 mg | Freq: Every day | INTRAVENOUS | Status: DC
  Filled 2016-04-13: qty 80

## 2016-04-13 MED ORDER — LORAZEPAM 2 MG/ML IJ SOLN
0.5000 mg | Freq: Once | INTRAMUSCULAR | Status: DC
Start: 2016-04-13 — End: 2016-04-13

## 2016-04-13 NOTE — ED Notes (Signed)
PA at the bedside.

## 2016-04-13 NOTE — ED Notes (Signed)
Patient states dark stool x 2 weeks.  Patient states "the last time this happened, I was having a GI bleed".  Patient denies chest pain, abdominal pain and N/V/D.   Patient states "I was admitted last time, and I want to go to [facility]".  Patient denies other symptoms at this time.

## 2016-04-13 NOTE — ED Notes (Signed)
[name] at the bedside

## 2016-04-13 NOTE — ED Notes (Signed)
GI MD stated patient did not need to be admitted. Pt to be discharged home

## 2016-04-13 NOTE — ED Notes (Signed)
Pt listed as a FALL RISK. Pt asked if he wanted a bedside commode. Pt refused to use a bedside commode. Pt very stern and adamant about walking to the bathroom on his own. Informed Hannah - RN.

## 2016-04-13 NOTE — Discharge Instructions (Signed)
Please follow with your primary care doctor in the next 2 days for a check-up. They must obtain records for further management.   Do not hesitate to return to the Emergency Department for any new, worsening or concerning symptoms.    Gastrointestinal Bleeding Gastrointestinal (GI) bleeding means there is bleeding somewhere along the digestive tract, between the mouth and anus. CAUSES  There are many different problems that can cause GI bleeding. Possible causes include:  Esophagitis. This is inflammation, irritation, or swelling of the esophagus.  Hemorrhoids.These are veins that are full of blood (engorged) in the rectum. They cause pain, inflammation, and may bleed.  Anal fissures.These are areas of painful tearing which may bleed. They are often caused by passing hard stool.  Diverticulosis.These are pouches that form on the colon over time, with age, and may bleed significantly.  Diverticulitis.This is inflammation in areas with diverticulosis. It can cause pain, fever, and bloody stools, although bleeding is rare.  Polyps and cancer. Colon cancer often starts out as precancerous polyps.  Gastritis and ulcers.Bleeding from the upper gastrointestinal tract (near the stomach) may travel through the intestines and produce black, sometimes tarry, often bad smelling stools. In certain cases, if the bleeding is fast enough, the stools may not be black, but red. This condition may be life-threatening. SYMPTOMS   Vomiting bright red blood or material that looks like coffee grounds.  Bloody, black, or tarry stools. DIAGNOSIS  Your caregiver may diagnose your condition by taking your history and performing a physical exam. More tests may be needed, including:  X-rays and other imaging tests.  Esophagogastroduodenoscopy (EGD). This test uses a flexible, lighted tube to look at your esophagus, stomach, and small intestine.  Colonoscopy. This test uses a flexible, lighted tube to look  at your colon. TREATMENT  Treatment depends on the cause of your bleeding.   For bleeding from the esophagus, stomach, small intestine, or colon, the caregiver doing your EGD or colonoscopy may be able to stop the bleeding as part of the procedure.  Inflammation or infection of the colon can be treated with medicines.  Many rectal problems can be treated with creams, suppositories, or warm baths.  Surgery is sometimes needed.  Blood transfusions are sometimes needed if you have lost a lot of blood. If bleeding is slow, you may be allowed to go home. If there is a lot of bleeding, you will need to stay in the hospital for observation. HOME CARE INSTRUCTIONS   Take any medicines exactly as prescribed.  Keep your stools soft by eating foods that are high in fiber. These foods include whole grains, legumes, fruits, and vegetables. Prunes (1 to 3 a day) work well for many people.  Drink enough fluids to keep your urine clear or pale yellow. SEEK IMMEDIATE MEDICAL CARE IF:   Your bleeding increases.  You feel lightheaded, weak, or you faint.  You have severe cramps in your back or abdomen.  You pass large blood clots in your stool.  Your problems are getting worse. MAKE SURE YOU:   Understand these instructions.  Will watch your condition.  Will get help right away if you are not doing well or get worse.   This information is not intended to replace advice given to you by your health care provider. Make sure you discuss any questions you have with your health care provider.   Document Released: 10/23/2000 Document Revised: 10/12/2012 Document Reviewed: 04/15/2015 Elsevier Interactive Patient Education [employer].

## 2016-04-13 NOTE — ED Notes (Signed)
Pt stated that he defecated and urinated, during his trip to the bathroom. Pt also stated that "there was no blood visible". Informed Hannah - RN.

## 2016-04-13 NOTE — ED Notes (Signed)
Attempted Report x1.   

## 2016-04-13 NOTE — ED Provider Notes (Addendum)
CSN: [id number]     Arrival date & time 04/13/16  1010 History   First MD Initiated Contact with Patient 04/13/16 1024     Chief Complaint  Patient presents with  . Blood In Stools     (Consider location/radiation/quality/duration/timing/severity/associated sxs/prior Treatment) HPI   Blood pressure 172/83, pulse 116, temperature 97.6 F (36.4 C), temperature source Oral, resp. rate 16, height 5\' 8"  (1.727 m), weight 73.483 kg, SpO2 97 %.  [name] is a 82 y.o. male with past medical history significant for alcoholic cirrhosis, CKD, hypertropic cardiomyopathy, hypertension, hyperlipidemia complaining of generalized weakness with intermittently loose  and darker than normal stool and intermittent shortness of breath onset 2 weeks ago. Patient states he feels similar to prior upper GI bleed. Patient denies excessive NSAID use but takes a low-dose 81 mg aspirin daily, he is not anticoagulated. He denies nausea, vomiting, hematochezia palpitations, syncope, chest pain, shortness of breath, orthopnea, PND. Increasing peripheral edema. States he takes iron for chronic anemia intermittently, states he takes his Dexilant regularly.  Upper endoscopy in 2014 by Fuller Plan showed 2 small varices with erosive gastritis and duodenitis with nonbleeding ulcers  [place]  Past Medical History  Diagnosis Date  . Hypertrophic cardiomyopathy ([place])   . HTN (hypertension)   . HLD (hyperlipidemia)   . Alcoholic cirrhosis ([place])   . GERD (gastroesophageal reflux disease)   . Renal insufficiency   . Alcoholism ([place])   . 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 Rhabdomyolysis 01/2005  . Colon polyp perhaps in 1990s    pathology/type not known  . Diverticulosis of colon perhaps in 1990s  . Ascites 11/2004    "early" SBP on paracentesis.   . Portal hypertensive gastropathy 2006  . Fracture of wrist 2014 spring vs early summer    refused orthopods suggestion to set the bone.   . CKD (chronic kidney  disease), stage III 10/11/2015   Past Surgical History  Procedure Laterality Date  . Inguinal hernia repair    . Melanoma excision Left 02/2011    malignant melanoma of thigh  . Basal cell carcinoma excision Left 11/2012    lower eyelid  . Esophagogastroduodenoscopy N/A 08/04/2013    Procedure: ESOPHAGOGASTRODUODENOSCOPY (EGD);  Surgeon: Ladene Artist, MD;  Location[facility] END[facility]COPY;  Service: Endoscopy;  Laterality: N/A;   Family History  Problem Relation Age of Onset  . Cancer Father 3    lung cancer  . Heart disease Mother   . Heart disease Sister    Social History  Substance Use Topics  . Smoking status: Never Smoker   . Smokeless tobacco: Never Used  . Alcohol Use: No     Comment: member of AA    Review of Systems  10 systems reviewed and found to be negative, except as noted in the HPI.  Allergies  Review of patient's allergies indicates no known allergies.  Home Medications   Prior to Admission medications   Medication Sig Start Date End Date Taking? Authorizing Provider  acetaminophen (TYLENOL) 325 MG tablet Take 650 mg by mouth every 6 (six) hours as needed for pain.   Yes Historical Provider, MD  amLODipine (NORVASC) 10 MG tablet Take 1 tablet (10 mg total) by mouth daily. 01/27/16  Yes Burnell Blanks, MD  DEXILANT 60 MG capsule TAKE ONE CAPSULE EACH DAY 08/16/15  Yes Biagio Borg, MD  furosemide (LASIX) 40 MG tablet Take 1 tablet (40 mg total) by mouth daily. 01/22/16  Yes Burnell Blanks, MD  Multiple Vitamin (MULTIVITAMIN WITH MINERALS) TABS Take 1 tablet by mouth daily.   Yes Historical Provider, MD  simvastatin (ZOCOR) 20 MG tablet TAKE ONE TABLET AT BEDTIME 03/06/16  Yes Biagio Borg, MD  spironolactone (ALDACTONE) 25 MG tablet TAKE ONE TABLET EACH DAY 02/12/16  Yes Biagio Borg, MD  zolpidem (AMBIEN) 5 MG tablet TAKE ONE TABLET AT BEDTIME AS NEEDED FORSLEEP 01/28/16  Yes Biagio Borg, MD  metoprolol succinate (TOPROL-XL) 100 MG 24 hr tablet TAKE ONE  AND ONE-HALF TABLETS DAILY WITHOR IMMEDIATELY FOLLOWING A MEAL 12/23/15   Biagio Borg, MD  pantoprazole (PROTONIX) 20 MG tablet Take 2 tablets (40 mg total) by mouth daily. 04/13/16   Rhonda Linan, PA-C  simvastatin (ZOCOR) 20 MG tablet Take 20 mg by mouth daily at 6 PM.    Historical Provider, MD   BP 141/74 mmHg  Pulse 103  Temp(Src) 97.4 F (36.3 C) (Oral)  Resp 25  Ht 5\' 8"  (1.727 m)  Wt 73.483 kg  BMI 24.64 kg/m2  SpO2 94% Physical Exam  Constitutional: He is oriented to person, place, and time. He appears well-developed and well-nourished. No distress.  HENT:  Head: Normocephalic.  Mild conjunctival pallor  Eyes: Conjunctivae and EOM are normal.  Cardiovascular:  Tachycardic to the 110s, regular  Pulmonary/Chest: Effort normal. No stridor.  Genitourinary:  Digital rectal exam a chaperoned by nurse, slightly darker than normal stool. No melena. Normal rectal tone.  Musculoskeletal: Normal range of motion. He exhibits edema. He exhibits no tenderness.  2+ pitting edema to midshin, typical for her baseline as per patient.  Neurological: He is alert and oriented to person, place, and time.  Psychiatric: He has a normal mood and affect.  Nursing note and vitals reviewed.   ED Course  Procedures (including critical care time) Labs Review Labs Reviewed  PROTIME-INR - Abnormal; Notable for the following:    Prothrombin Time 15.5 (*)    All other components within normal limits  COMPREHENSIVE METABOLIC PANEL - Abnormal; Notable for the following:    Chloride 112 (*)    CO2 19 (*)    Glucose, Bld 136 (*)    BUN 44 (*)    Creatinine, Ser 1.90 (*)    GFR calc non Af Amer 31 (*)    GFR calc Af Amer 36 (*)    All other components within normal limits  CBC WITH DIFFERENTIAL/PLATELET - Abnormal; Notable for the following:    RBC 3.49 (*)    Hemoglobin 10.8 (*)    HCT 33.4 (*)    All other components within normal limits  BRAIN NATRIURETIC PEPTIDE - Abnormal; Notable for  the following:    B Natriuretic Peptide 107.2 (*)    All other components within normal limits  URINALYSIS, ROUTINE W REFLEX MICROSCOPIC (NOT AT [facility]) - Abnormal; Notable for the following:    Protein, ur 30 (*)    All other components within normal limits  URINE MICROSCOPIC-ADD ON - Abnormal; Notable for the following:    Squamous Epithelial / LPF 0-5 (*)    Casts HYALINE CASTS (*)    All other components within normal limits  I-STAT CHEM 8, ED - Abnormal; Notable for the following:    BUN 41 (*)    Creatinine, Ser 1.90 (*)    Glucose, Bld 131 (*)    Hemoglobin 11.2 (*)    HCT 33.0 (*)    All other components within normal limits  POC OCCULT BLOOD, ED - Abnormal;  Notable for the following:    Fecal Occult Bld POSITIVE (*)    All other components within normal limits  APTT  I-STAT CG4 LACTIC ACID, ED  I-STAT TROPOININ, ED  TYPE AND SCREEN    Imaging Review Dg Chest Port 1 View  04/13/2016  CLINICAL DATA:  GI bleeding and shortness of breath. EXAM: PORTABLE CHEST 1 VIEW COMPARISON:  10/04/2015 FINDINGS: The heart size and mediastinal contours are within normal limits. Stable elevation of the right hemidiaphragm with volume loss of the right lung. There is no evidence of pulmonary edema, consolidation, pneumothorax, nodule or pleural fluid. The visualized skeletal structures are unremarkable. IMPRESSION: No active disease. Electronically Signed   By: Aletta Edouard M.D.   On: 04/13/2016 11:34   I have personally reviewed and evaluated these images and lab results as part of my medical decision-making.   EKG Interpretation   Date/Time:  Monday April 13 2016 10:49:17 EDT Ventricular Rate:  103 PR Interval:  193 QRS Duration: 137 QT Interval:  367 QTC Calculation: 480 R Axis:   19 Text Interpretation:  Sinus tachycardia Right bundle branch block Baseline  wander in lead(s) V2 No significant change was found Confirmed by CAMPOS   MD, Lennette Bihari ([id number]) on 04/13/2016 10:57:15 AM Also  confirmed by CAMPOS  MD,  KEVIN ([id number]), editor Gilford Rile, CCT, [employer] ([id number])  on 04/13/2016 11:11:54 AM      MDM   Final diagnoses:  UGI bleed    Filed Vitals:   04/13/16 1400 04/13/16 1430 04/13/16 1445 04/13/16 1504  BP: 145/82 152/82 128/77 141/74  Pulse: 101 103 102 103  Temp:    97.4 F (36.3 C)  TempSrc:    Oral  Resp: 24 24 32 25  Height:      Weight:      SpO2: 95% 97% 95% 94%    Medications  pantoprazole (PROTONIX) 80 mg in sodium chloride 0.9 % 250 mL (0.32 mg/mL) infusion (0 mg/hr Intravenous Stopped 04/13/16 1514)  LORazepam (ATIVAN) injection 0.5 mg (not administered)  pantoprazole (PROTONIX) 80 mg in sodium chloride 0.9 % 100 mL IVPB (0 mg Intravenous Stopped 04/13/16 1230)    MELISSA CLEMENTE is 82 y.o. male presenting with With past medical history significant for alcoholic cirrhosis, prior upper GI bleed complaining of generalized weakness with intermittent loose and darker than normal stool for 2 weeks. He takes a daily low-dose aspirin, is not anticoagulated, patient is tachycardic in the 110s, blood pressure strong with systolic in the [id number]. He is beta blocked with metoprolol. Guaiac with slightly dark stool, positive for occult blood. No significant anemia, hemoglobin 10.5 down from 12, remains persistently tachycardic. Will need serial CBCs. Patient grudgingly accepts admission request 6N  This is a shared visit with the attending physician who personally evaluated the patient and agrees with the care plan.   Discussed with PA  [facility] who accepts admission with Dr. Marily Memos.     Monico Blitz, PA-C 04/13/16 1331  Discussed with [facility] PA Trixie Deis, they will evaluate patient.  Monico Blitz, PA-C 04/13/16 [place], MD 04/13/16 1341  She has becoming increasingly agitated in the ED, states she wants to go home. As per her daughter who says that she is medical POA, she understands that she needs to stay in the hospital. Half milligram  of Ativan given  Monico Blitz, PA-C 04/13/16 1517  Jola Schmidt, MD 04/13/16 1651

## 2016-04-13 NOTE — Consult Note (Signed)
[facility] Consult: 1:37 PM 04/13/2016     Referring Provider: DR Venora Maples  Primary Care Physician:  [name], MD Primary Gastroenterologist:  Dr. Fuller Plan.  Previous Dr Sharlett Iles pt.      Reason for Consultation:  FOBT +.     HPI: [name] is a 82 y.o. male.  Retired Industrial/product designer.  Alcoholic with cirrhosis, Child's A as of 06/2011.  Chronic IDA.  Htn.  Hypertrophic CM.  Impaired memory.  CKD stage 3.  Hypertrophic CM.  Chronic, dependent LE edema. Previous ascites and paracentesis 2006, no ascites on 05/2011 ultrasound.  Degenerative disc/spine disease and compression fracture.     07/2013 EGD for melena, Hgb 9.6. :  2 columns, small varices, distal esophagus. Gastritis.  Non-bleeding duodenal ulcers, erosive duodenitis.  Serum H Pylori negative.  Did not require PRBCs then.  11/2004 EGD: mild portal gastropathy.  Colonoscopy remotely (well before 2006).  + colon polyps of unknown type.  Pt refused repeat colonoscopy, despite + fm hx colon cancer in father.   Intermittent, dark, formed stools x 2 weeks.  No n/v/d/melena.  No abd pain.  No anorexia, no dysphagia.  Not taking any PPI (dexilant listed but his handwritten meds do not show this) and takes 400 mg Ibuprofen daily.  No ETOH since 2014.  Pt called PMD office and was advised to go to ED.  Hgb 10.8 and 11.2, was 12.5 from 07/2015 through 10/2015.  MCV stable at 95.  Platelets normal.  FOBT +.   coags 15.5/1.2.  BUN/creatine slightly improved c/w 10/2015.  Pt aware of the Hgb results and feels he can pursue necessary work up as outpt, does not want admission.   He minimizes, vs denies sxs.  But his wife says he has fatigue and lack of energy for 3 to 4 weeks.  Appetite is good.  No blood PR.      Past Medical History  Diagnosis Date  . Hypertrophic  cardiomyopathy ([place])   . HTN (hypertension)   . HLD (hyperlipidemia)   . Alcoholic cirrhosis ([place])   . GERD (gastroesophageal reflux disease)   . Renal insufficiency   . Alcoholism ([place])   . 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 Rhabdomyolysis 01/2005  . Colon polyp perhaps in 1990s    pathology/type not known  . Diverticulosis of colon perhaps in 1990s  . Ascites 11/2004    "early" SBP on paracentesis.   . Portal hypertensive gastropathy 2006  . Fracture of wrist 2014 spring vs early summer    refused orthopods suggestion to set the bone.   . CKD (chronic kidney disease), stage III 10/11/2015    Past Surgical History  Procedure Laterality Date  . Inguinal hernia repair    . Melanoma excision Left 02/2011    malignant melanoma of thigh  . Basal cell carcinoma excision Left 11/2012    lower eyelid  . Esophagogastroduodenoscopy N/A 08/04/2013    Procedure: ESOPHAGOGASTRODUODENOSCOPY (EGD);  Surgeon: Ladene Artist, MD;  Location: [facility] ENDOSCOPY;  Service: Endoscopy;  Laterality: N/A;    Prior to  Admission medications   Medication Sig Start Date End Date Taking? Authorizing Provider  acetaminophen (TYLENOL) 325 MG tablet Take 650 mg by mouth every 6 (six) hours as needed for pain.    Historical Provider, MD  amLODipine (NORVASC) 10 MG tablet Take 1 tablet (10 mg total) by mouth daily. 01/27/16   Burnell Blanks, MD  DEXILANT 60 MG capsule TAKE ONE CAPSULE EACH DAY 08/16/15   Biagio Borg, MD  furosemide (LASIX) 40 MG tablet Take 1 tablet (40 mg total) by mouth daily. 01/22/16   Burnell Blanks, MD  metoprolol succinate (TOPROL-XL) 100 MG 24 hr tablet TAKE ONE AND ONE-HALF TABLETS DAILY [facility] IMMEDIATELY FOLLOWING A MEAL 12/23/15   Biagio Borg, MD  Multiple Vitamin (MULTIVITAMIN WITH MINERALS) TABS Take 1 tablet by mouth daily.    Historical Provider, MD  simvastatin (ZOCOR) 20 MG tablet Take 20 mg by mouth daily at 6 PM.    Historical Provider, MD  simvastatin (ZOCOR) 20  MG tablet TAKE ONE TABLET AT BEDTIME 03/06/16   Biagio Borg, MD  spironolactone (ALDACTONE) 25 MG tablet TAKE ONE TABLET EACH DAY 02/12/16   Biagio Borg, MD  zolpidem (AMBIEN) 5 MG tablet TAKE ONE TABLET AT BEDTIME AS NEEDED FORSLEEP 01/28/16   Biagio Borg, MD    Scheduled Meds:   Infusions: . pantoprozole (PROTONIX) infusion 8 mg/hr (04/13/16 1215)   PRN Meds:    Allergies as of 04/13/2016  . (No Known Allergies)    Family History  Problem Relation Age of Onset  . Cancer Father 81    lung cancer  . Heart disease Mother   . Heart disease Sister     Social History   Social History  . Marital Status: Married    Spouse Name: N/A  . Number of Children: 4  . Years of Education: 24   Occupational History  . pathologist     retired   Social History Main Topics  . Smoking status: Never Smoker   . Smokeless tobacco: Never Used  . Alcohol Use: No     Comment: member of AA  . Drug Use: No  . Sexual Activity: Not on file   Other Topics Concern  . 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  Remains independent. ACP- not discussed, will approach at next OV    REVIEW OF SYSTEMS: Constitutional: wt currently is up 12 # c/w 10/2015, stable c/w 02/13/16.  Weakness.  ENT:  No nose bleeds Pulm:  Denies new DOE or cough CV:  No palpitations, no LE edema.  GU:  No hematuria, no frequency GI:  Per HPI Heme:  Per HPI.  No unusual bleeding or bruising.   Transfusions:  None found in Epic.   Neuro:  No headaches, no peripheral tingling or numbness Derm:  No itching, no rash or sores.  Endocrine:  No sweats or chills.  No polyuria or dysuria Immunization:  reviewed Travel:  None beyond local counties in last few months.    PHYSICAL EXAM: Vital signs in last 24 hours: Filed Vitals:   04/13/16 1230 04/13/16 1245  BP: 133/83 146/78  Pulse: 104 102  Temp:    Resp: 23 19   Wt Readings from Last 3 Encounters:  04/13/16  73.483 kg (162 lb)  02/13/16 73.755 kg (162 lb 9.6 oz)  10/28/15 68.402 kg (150 lb 12.8 oz)    General: chronically unwell looking, frail  elderly WM.  Comfortable.  Fully alert Head:  No asymmetry or swlling   Eyes:  No pallor or icterus.  EOMI Ears:  Slightly HPH  Nose:  No discharge or congestion.  Mouth:  Clear, moist, no blood Neck:  No mass, no JVD.  No TMG Lungs:  Some crackles in bases.  No dyspnea or cough with speach Heart: RRR.  Slightly tachy.  No MRG.  S1/s2 present Abdomen:  Soft, NT, ND.  BS active.  No mass, no HSM.  No bruits..   Rectal: dark, brown.  FOBT +   Musc/Skeltl: large, soft, NT  mass ("lipoma" per pt) on right elbow.  Post fracture right wrist deformity.    Extremities:  Left > right ankle, pedal edema.  2 to 3 + pitting.   Neurologic:  Alert.  Oriented x 3.  No gross limb weakness, moves all 4s, strength not tested.  No tremor.  Skin:  No telangectasia or rash Tattoos:  none   Psych:  Somewhat agitated, no obvious depression.   LAB RESULTS:  Recent Labs  04/13/16 1046 04/13/16 1101  WBC 9.2  --   HGB 10.8* 11.2*  HCT 33.4* 33.0*  PLT 230  --    BMET Lab Results  Component Value Date   NA 143 04/13/2016   NA 140 04/13/2016   NA 137 10/23/2015   K 4.6 04/13/2016   K 4.6 04/13/2016   K 5.2* 10/23/2015   CL 110 04/13/2016   CL 112* 04/13/2016   CL 108 10/23/2015   CO2 19* 04/13/2016   CO2 21 10/23/2015   CO2 18* 10/04/2015   GLUCOSE 131* 04/13/2016   GLUCOSE 136* 04/13/2016   GLUCOSE 117* 10/23/2015   BUN 41* 04/13/2016   BUN 44* 04/13/2016   BUN 40* 10/23/2015   CREATININE 1.90* 04/13/2016   CREATININE 1.90* 04/13/2016   CREATININE 2.01* 10/23/2015   CALCIUM 8.9 04/13/2016   CALCIUM 8.8 10/23/2015   CALCIUM 8.8 10/04/2015   LFT  Recent Labs  04/13/16 1046  PROT 6.7  ALBUMIN 3.5  AST 21  ALT 17  ALKPHOS 75  BILITOT 0.7   PT/INR Lab Results  Component Value Date   INR 1.21 04/13/2016   INR 1.15 08/04/2013   INR 1.03  12/19/2012    RADIOLOGY STUDIES: Dg Chest Port 1 View  04/13/2016  CLINICAL DATA:  GI bleeding and shortness of breath. EXAM: PORTABLE CHEST 1 VIEW COMPARISON:  10/04/2015 FINDINGS: The heart size and mediastinal contours are within normal limits. Stable elevation of the right hemidiaphragm with volume loss of the right lung. There is no evidence of pulmonary edema, consolidation, pneumothorax, nodule or pleural fluid. The visualized skeletal structures are unremarkable. IMPRESSION: No active disease. Electronically Signed   By: Aletta Edouard M.D.   On: 04/13/2016 11:34    ENDOSCOPIC STUDIES: Per HPI  IMPRESSION:   *  FOBT + stool.  Pt hx of GIB from duodenal ulcers, small esophageal varices noted.  Has not been compliant with PPI.  Takes 400 mg Ibuprofen daily.  ? Recurrent PUD, ? Portal gastropathy, ? Variceal bleeding  *  Normocytic anemia.   *  ETOH cirrhosis.  Abstinent since spring/summer 2014.   *  Remote hx colon polyps of unclear type, has refused repeat screening.    *  Non-ischemic CM.  resp status stable.    PLAN:     *  Per Dr Carlean Purl.    Azucena Freed  04/13/2016, 1:37 PM Pager: [phone]  [facility] Attending   I have taken an interval history, reviewed the chart and examined the patient. I agree with the Advanced Practitioner's note, impression and recommendations.     He has heme + stool and slight decrease in baseline Hgb Stool brown now  He neds to go back on a PPI DC from ED ok Sounds like repeating an EGD could be appropriate - vs clinical f/u We will arrange office f/u through Dr. Fuller Plan in our office  Gatha Mayer, MD, [facility] [phone] (pager) [phone] after 5 PM, weekends and holidays  04/13/2016 2:59 PM   [id number] [name], MD Lucio Edward, MD

## 2016-04-13 NOTE — Telephone Encounter (Signed)
Error

## 2016-04-13 NOTE — ED Notes (Signed)
Pt stated that he defecated and urinated. Pt also stated that "they were a normal color".

## 2016-04-13 NOTE — ED Notes (Signed)
MD Hospitalist at the bedside

## 2016-04-29 ENCOUNTER — Emergency Department (HOSPITAL_COMMUNITY): Payer: Medicare Other

## 2016-04-29 ENCOUNTER — Inpatient Hospital Stay (HOSPITAL_COMMUNITY)
Admission: EM | Admit: 2016-04-29 | Discharge: 2016-05-02 | DRG: 281 | Disposition: A | Discharge status: Nursing Home (Includes ICF) | Payer: Medicare Other | Attending: Family Medicine | Admitting: Family Medicine

## 2016-04-29 ENCOUNTER — Encounter (HOSPITAL_COMMUNITY): Payer: Self-pay

## 2016-04-29 ENCOUNTER — Ambulatory Visit (HOSPITAL_COMMUNITY): Payer: Medicare Other

## 2016-04-29 ENCOUNTER — Other Ambulatory Visit: Payer: Self-pay

## 2016-04-29 DIAGNOSIS — D62 Acute posthemorrhagic anemia: Secondary | ICD-10-CM | POA: Diagnosis not present

## 2016-04-29 DIAGNOSIS — D638 Anemia in other chronic diseases classified elsewhere: Secondary | ICD-10-CM | POA: Diagnosis present

## 2016-04-29 DIAGNOSIS — K449 Diaphragmatic hernia without obstruction or gangrene: Secondary | ICD-10-CM | POA: Diagnosis present

## 2016-04-29 DIAGNOSIS — Z85820 Personal history of malignant melanoma of skin: Secondary | ICD-10-CM

## 2016-04-29 DIAGNOSIS — K766 Portal hypertension: Secondary | ICD-10-CM | POA: Diagnosis present

## 2016-04-29 DIAGNOSIS — R7989 Other specified abnormal findings of blood chemistry: Secondary | ICD-10-CM

## 2016-04-29 DIAGNOSIS — K922 Gastrointestinal hemorrhage, unspecified: Secondary | ICD-10-CM | POA: Diagnosis present

## 2016-04-29 DIAGNOSIS — K2970 Gastritis, unspecified, without bleeding: Secondary | ICD-10-CM | POA: Diagnosis present

## 2016-04-29 DIAGNOSIS — I214 Non-ST elevation (NSTEMI) myocardial infarction: Secondary | ICD-10-CM | POA: Diagnosis present

## 2016-04-29 DIAGNOSIS — R079 Chest pain, unspecified: Secondary | ICD-10-CM | POA: Diagnosis present

## 2016-04-29 DIAGNOSIS — K921 Melena: Secondary | ICD-10-CM | POA: Diagnosis not present

## 2016-04-29 DIAGNOSIS — K7030 Alcoholic cirrhosis of liver without ascites: Secondary | ICD-10-CM | POA: Diagnosis present

## 2016-04-29 DIAGNOSIS — D500 Iron deficiency anemia secondary to blood loss (chronic): Secondary | ICD-10-CM | POA: Diagnosis present

## 2016-04-29 DIAGNOSIS — K3189 Other diseases of stomach and duodenum: Secondary | ICD-10-CM | POA: Diagnosis present

## 2016-04-29 DIAGNOSIS — I8510 Secondary esophageal varices without bleeding: Secondary | ICD-10-CM | POA: Diagnosis present

## 2016-04-29 DIAGNOSIS — I248 Other forms of acute ischemic heart disease: Secondary | ICD-10-CM | POA: Diagnosis present

## 2016-04-29 DIAGNOSIS — I4510 Unspecified right bundle-branch block: Secondary | ICD-10-CM | POA: Diagnosis present

## 2016-04-29 DIAGNOSIS — K269 Duodenal ulcer, unspecified as acute or chronic, without hemorrhage or perforation: Secondary | ICD-10-CM | POA: Diagnosis present

## 2016-04-29 DIAGNOSIS — R195 Other fecal abnormalities: Secondary | ICD-10-CM | POA: Diagnosis not present

## 2016-04-29 DIAGNOSIS — N183 Chronic kidney disease, stage 3 unspecified: Secondary | ICD-10-CM | POA: Diagnosis present

## 2016-04-29 DIAGNOSIS — I10 Essential (primary) hypertension: Secondary | ICD-10-CM

## 2016-04-29 DIAGNOSIS — F1020 Alcohol dependence, uncomplicated: Secondary | ICD-10-CM | POA: Diagnosis present

## 2016-04-29 DIAGNOSIS — R778 Other specified abnormalities of plasma proteins: Secondary | ICD-10-CM | POA: Diagnosis present

## 2016-04-29 DIAGNOSIS — E785 Hyperlipidemia, unspecified: Secondary | ICD-10-CM | POA: Diagnosis present

## 2016-04-29 DIAGNOSIS — Z8249 Family history of ischemic heart disease and other diseases of the circulatory system: Secondary | ICD-10-CM

## 2016-04-29 DIAGNOSIS — I421 Obstructive hypertrophic cardiomyopathy: Secondary | ICD-10-CM | POA: Diagnosis present

## 2016-04-29 DIAGNOSIS — Z79899 Other long term (current) drug therapy: Secondary | ICD-10-CM | POA: Diagnosis not present

## 2016-04-29 DIAGNOSIS — E7800 Pure hypercholesterolemia, unspecified: Secondary | ICD-10-CM | POA: Diagnosis present

## 2016-04-29 DIAGNOSIS — I872 Venous insufficiency (chronic) (peripheral): Secondary | ICD-10-CM | POA: Diagnosis present

## 2016-04-29 DIAGNOSIS — R609 Edema, unspecified: Secondary | ICD-10-CM | POA: Diagnosis not present

## 2016-04-29 DIAGNOSIS — I129 Hypertensive chronic kidney disease with stage 1 through stage 4 chronic kidney disease, or unspecified chronic kidney disease: Secondary | ICD-10-CM | POA: Diagnosis present

## 2016-04-29 DIAGNOSIS — F0390 Unspecified dementia without behavioral disturbance: Secondary | ICD-10-CM | POA: Diagnosis present

## 2016-04-29 DIAGNOSIS — K219 Gastro-esophageal reflux disease without esophagitis: Secondary | ICD-10-CM | POA: Diagnosis present

## 2016-04-29 LAB — I-STAT TROPONIN, ED: TROPONIN I, POC: 0.34 ng/mL — AB (ref 0.00–0.08)

## 2016-04-29 LAB — CBC
HCT: 28.8 % — ABNORMAL LOW (ref 39.0–52.0)
HEMOGLOBIN: 9.1 g/dL — AB (ref 13.0–17.0)
MCH: 30.7 pg (ref 26.0–34.0)
MCHC: 31.6 g/dL (ref 30.0–36.0)
MCV: 97.3 fL (ref 78.0–100.0)
PLATELETS: 236 10*3/uL (ref 150–400)
RBC: 2.96 MIL/uL — ABNORMAL LOW (ref 4.22–5.81)
RDW: 13.9 % (ref 11.5–15.5)
WBC: 7.9 10*3/uL (ref 4.0–10.5)

## 2016-04-29 LAB — PROTIME-INR
INR: 1.18 (ref 0.00–1.49)
Prothrombin Time: 15.1 seconds (ref 11.6–15.2)

## 2016-04-29 LAB — BASIC METABOLIC PANEL
ANION GAP: 10 (ref 5–15)
BUN: 45 mg/dL — AB (ref 6–20)
CALCIUM: 9.2 mg/dL (ref 8.9–10.3)
CO2: 21 mmol/L — ABNORMAL LOW (ref 22–32)
CREATININE: 1.83 mg/dL — AB (ref 0.61–1.24)
Chloride: 107 mmol/L (ref 101–111)
GFR calc Af Amer: 37 mL/min — ABNORMAL LOW (ref >60)
GFR, EST NON AFRICAN AMERICAN: 32 mL/min — AB (ref >60)
GLUCOSE: 112 mg/dL — AB (ref 65–99)
Potassium: 4.7 mmol/L (ref 3.5–5.1)
Sodium: 138 mmol/L (ref 135–145)

## 2016-04-29 LAB — TROPONIN I
TROPONIN I: 0.28 ng/mL — AB (ref <0.031)
Troponin I: 0.34 ng/mL — ABNORMAL HIGH (ref <0.031)
Troponin I: 0.28 ng/mL — ABNORMAL HIGH (ref <0.031)
Troponin I: 0.27 ng/mL — ABNORMAL HIGH (ref <0.031)

## 2016-04-29 LAB — POC OCCULT BLOOD, ED: Fecal Occult Bld: NEGATIVE

## 2016-04-29 MED ORDER — BISACODYL 10 MG RE SUPP: 10.0000 mg | Freq: Every day | RECTAL | Status: DC | PRN

## 2016-04-29 MED ORDER — ISOSORBIDE MONONITRATE ER 30 MG PO TB24
30.0000 mg | ORAL_TABLET | Freq: Every day | ORAL | Status: DC
  Administered 2016-04-29 – 2016-05-02 (×4): 30 mg via ORAL
  Filled 2016-04-29 (×4): qty 1

## 2016-04-29 MED ORDER — PANTOPRAZOLE SODIUM 40 MG PO TBEC: 40.0000 mg | DELAYED_RELEASE_TABLET | Freq: Every day | ORAL | Status: DC

## 2016-04-29 MED ORDER — ACETAMINOPHEN 650 MG RE SUPP: 650.0000 mg | Freq: Four times a day (QID) | RECTAL | Status: DC | PRN

## 2016-04-29 MED ORDER — MORPHINE SULFATE (PF) 2 MG/ML IV SOLN: 2.0000 mg | INTRAVENOUS | Status: DC | PRN

## 2016-04-29 MED ORDER — FUROSEMIDE 40 MG PO TABS
40.0000 mg | ORAL_TABLET | Freq: Every day | ORAL | Status: DC
  Administered 2016-04-29 – 2016-05-02 (×4): 40 mg via ORAL
  Filled 2016-04-29 (×4): qty 1

## 2016-04-29 MED ORDER — MAGNESIUM CITRATE PO SOLN: 1.0000 | Freq: Once | ORAL | Status: DC | PRN

## 2016-04-29 MED ORDER — ASPIRIN 81 MG PO CHEW
324.0000 mg | CHEWABLE_TABLET | Freq: Once | ORAL | Status: AC
  Administered 2016-04-29: 324 mg via ORAL
  Filled 2016-04-29: qty 4

## 2016-04-29 MED ORDER — GI COCKTAIL ~~LOC~~: 30.0000 mL | Freq: Four times a day (QID) | ORAL | Status: DC | PRN

## 2016-04-29 MED ORDER — SODIUM CHLORIDE 0.9% FLUSH
3.0000 mL | Freq: Two times a day (BID) | INTRAVENOUS | Status: DC
  Administered 2016-04-29 – 2016-05-01 (×3): 3 mL via INTRAVENOUS

## 2016-04-29 MED ORDER — SENNOSIDES-DOCUSATE SODIUM 8.6-50 MG PO TABS: 1.0000 | ORAL_TABLET | Freq: Every evening | ORAL | Status: DC | PRN

## 2016-04-29 MED ORDER — ACETAMINOPHEN 325 MG PO TABS: 650.0000 mg | ORAL_TABLET | Freq: Four times a day (QID) | ORAL | Status: DC | PRN

## 2016-04-29 MED ORDER — SIMVASTATIN 20 MG PO TABS
20.0000 mg | ORAL_TABLET | Freq: Every day | ORAL | Status: DC
  Administered 2016-04-29 – 2016-05-01 (×3): 20 mg via ORAL
  Filled 2016-04-29 (×3): qty 1

## 2016-04-29 MED ORDER — SPIRONOLACTONE 25 MG PO TABS
25.0000 mg | ORAL_TABLET | Freq: Once | ORAL | Status: AC
  Administered 2016-04-29: 25 mg via ORAL
  Filled 2016-04-29: qty 1

## 2016-04-29 MED ORDER — PROMETHAZINE HCL 25 MG PO TABS: 12.5000 mg | ORAL_TABLET | Freq: Four times a day (QID) | ORAL | Status: DC | PRN

## 2016-04-29 MED ORDER — TRAZODONE HCL 50 MG PO TABS
25.0000 mg | ORAL_TABLET | Freq: Every evening | ORAL | Status: DC | PRN
  Administered 2016-04-30 – 2016-05-02 (×3): 25 mg via ORAL
  Filled 2016-04-29 (×3): qty 1

## 2016-04-29 MED ORDER — SODIUM CHLORIDE 0.9 % IV SOLN
INTRAVENOUS | Status: DC
  Administered 2016-04-29 – 2016-05-01 (×4): via INTRAVENOUS

## 2016-04-29 MED ORDER — AMLODIPINE BESYLATE 10 MG PO TABS
10.0000 mg | ORAL_TABLET | Freq: Every day | ORAL | Status: DC
  Administered 2016-04-29 – 2016-05-02 (×4): 10 mg via ORAL
  Filled 2016-04-29 (×4): qty 1

## 2016-04-29 MED ORDER — METOPROLOL SUCCINATE ER 100 MG PO TB24
100.0000 mg | ORAL_TABLET | Freq: Every day | ORAL | Status: DC
  Administered 2016-04-29 – 2016-05-02 (×4): 100 mg via ORAL
  Filled 2016-04-29 (×4): qty 1

## 2016-04-29 MED ORDER — PANTOPRAZOLE SODIUM 40 MG PO TBEC
40.0000 mg | DELAYED_RELEASE_TABLET | Freq: Every day | ORAL | Status: DC
  Administered 2016-04-29 – 2016-04-30 (×2): 40 mg via ORAL
  Filled 2016-04-29 (×3): qty 1

## 2016-04-29 NOTE — Plan of Care (Signed)
Problem: Consults Goal: Skin Care Protocol Initiated - if Braden Score 18 or less If consults are not indicated, leave blank or document N/A Outcome: Not Applicable Date Met:  15/17/61 braden > 18

## 2016-04-29 NOTE — ED Provider Notes (Signed)
CSN: [id number]     Arrival date & time 04/29/16  [id number] History   First MD Initiated Contact with Patient 04/29/16 [phone]     Chief Complaint  Patient presents with  . 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  Significant past medical history for nonischemic cardiomyopathy. Most recent echo in 2014 without HOCM. Stage III chronic kidney disease, alcoholism with cirrhosis and portal gastropathy with esophageal varices. Recent GI bleed seen here 6/5. Hemoglobin of 10.8 then. Hypertension, hypercholesterolemia, degenerative spine disease.   07/2013 EGD for melena, Hgb 9.6. : 2 columns, small varices, distal esophagus. Gastritis. Non-bleeding duodenal ulcers, erosive duodenitis. Serum H Pylori negative. Did not require PRBCs then.   Patient reports chest pain as left anterior chest and left shoulder but not to his arm for a short time yesterday. He estimates less than an hour. Onset at rest. Resolved without intervention. Slept well without symptoms. Awakened at 5:30 with similar pain lasting approximately an hour to an hour and a half and resolving upon his arrival here.  Associated with some diaphoresis yesterday, not today. No shortness of breath or pleuritic discomfort.  Follows with Dr. Hyman Bible  . Has had echocardiograms. Has never had coronary angiogram.    Past Medical History  Diagnosis Date  . Hypertrophic cardiomyopathy ([place])   . HTN (hypertension)   . HLD (hyperlipidemia)   . Alcoholic cirrhosis ([place])   . GERD (gastroesophageal reflux disease)   . Renal insufficiency   . Alcoholism ([place])   . 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 Rhabdomyolysis 01/2005  . Colon polyp perhaps in 1990s    pathology/type not known  . Diverticulosis of colon perhaps in 1990s  . Ascites 11/2004    "early" SBP on paracentesis.   . Portal hypertensive gastropathy 2006  . Fracture of wrist 2014 spring vs early summer   refused orthopods suggestion to set the bone.   . CKD (chronic kidney disease), stage III 10/11/2015   Past Surgical History  Procedure Laterality Date  . Inguinal hernia repair    . Melanoma excision Left 02/2011    malignant melanoma of thigh  . Basal cell carcinoma excision Left 11/2012    lower eyelid  . Esophagogastroduodenoscopy N/A 08/04/2013    Procedure: ESOPHAGOGASTRODUODENOSCOPY (EGD);  Surgeon: Ladene Artist, MD;  [facility] ENDOSCOPY;  Service: Endoscopy;  Laterality: N/A;   Family History  Problem Relation Age of Onset  . Cancer Father 75    lung cancer  . Heart disease Mother   . Heart disease Sister    Social History  Substance Use Topics  . Smoking status: Never Smoker   . Smokeless tobacco: Never Used  . Alcohol Use: No     Comment: member of AA    Review of Systems  Constitutional: Positive for diaphoresis.  Cardiovascular: Positive for chest pain.       Left chest and left shoulder pain. Described as "an ache".      Allergies  Review of patient's allergies indicates no known allergies.  Home Medications   Prior to Admission medications   Medication Sig Start Date End Date Taking? Authorizing Provider  acetaminophen (TYLENOL) 325 MG tablet Take 650 mg by mouth every 6 (six) hours as needed for pain.   Yes Historical Provider, MD  amLODipine (NORVASC) 10 MG tablet Take 1 tablet (10 mg total) by mouth daily. 01/27/16  Yes Burnell Blanks, MD  DEXILANT 60 MG capsule TAKE ONE CAPSULE EACH DAY 08/16/15  Yes Biagio Borg, MD  furosemide (LASIX) 40 MG tablet Take 1 tablet (40 mg total) by mouth daily. 01/22/16  Yes Burnell Blanks, MD  metoprolol succinate (TOPROL-XL) 100 MG 24 hr tablet TAKE ONE AND ONE-HALF TABLETS DAILY [facility] IMMEDIATELY FOLLOWING A MEAL 12/23/15  Yes Biagio Borg, MD  Multiple Vitamin (MULTIVITAMIN WITH MINERALS) TABS Take 1 tablet by mouth daily.   Yes Historical Provider, MD  pantoprazole (PROTONIX) 20 MG tablet Take 2  tablets (40 mg total) by mouth daily. 04/13/16  Yes Nicole Pisciotta, PA-C  simvastatin (ZOCOR) 20 MG tablet TAKE ONE TABLET AT BEDTIME 03/06/16  Yes Biagio Borg, MD  spironolactone (ALDACTONE) 25 MG tablet TAKE ONE TABLET EACH DAY 02/12/16  Yes Biagio Borg, MD  zolpidem (AMBIEN) 5 MG tablet TAKE ONE TABLET AT BEDTIME AS NEEDED FORSLEEP 01/28/16  Yes Biagio Borg, MD   BP 147/65 mmHg  Pulse 74  Temp(Src) 97.6 F (36.4 C) (Oral)  Resp 21  SpO2 99% Physical Exam  Constitutional: He is oriented to person, place, and time. He appears well-developed and well-nourished. No distress.  HENT:  Head: Normocephalic.  Eyes: Conjunctivae are normal. Pupils are equal, round, and reactive to light. No scleral icterus.  No scleral icterus. Conjunctiva slightly pale.  Neck: Normal range of motion. Neck supple. No thyromegaly present.  Cardiovascular: Normal rate and regular rhythm.  Exam reveals no gallop and no friction rub.   No murmur heard. 2/6 systolic murmur heard across the precordium. Not to the carotids.  Pulmonary/Chest: Effort normal and breath sounds normal. No respiratory distress. He has no wheezes. He has no rales.  Left posterior basilar crackles. Do not clear with cough.  Abdominal: Soft. Bowel sounds are normal. He exhibits no distension. There is no tenderness. There is no rebound.  Musculoskeletal: Normal range of motion.  Neurological: He is alert and oriented to person, place, and time.  Skin: Skin is warm and dry. No rash noted.  1- 2+ lower extremity edema.  Psychiatric: He has a normal mood and affect. His behavior is normal.    ED Course  Procedures (including critical care time) Labs Review Labs Reviewed  BASIC METABOLIC PANEL - Abnormal; Notable for the following:    CO2 21 (*)    Glucose, Bld 112 (*)    BUN 45 (*)    Creatinine, Ser 1.83 (*)    GFR calc non Af Amer 32 (*)    GFR calc Af Amer 37 (*)    All other components within normal limits  CBC - Abnormal;  Notable for the following:    RBC 2.96 (*)    Hemoglobin 9.1 (*)    HCT 28.8 (*)    All other components within normal limits  I-STAT TROPOININ, ED - Abnormal; Notable for the following:    Troponin i, poc 0.34 (*)    All other components within normal limits  PROTIME-INR  TROPONIN I  POC OCCULT BLOOD, ED  TYPE AND SCREEN    Imaging Review Dg Chest 2 View  04/29/2016  CLINICAL DATA:  Chest pain and shortness of breath for 2 days. EXAM: CHEST  2 VIEW COMPARISON:  Single-view of the chest 04/13/2016. PA and lateral chest 10/04/2015. FINDINGS: Elevation of the right hemidiaphragm relative to the left is unchanged. The lungs are clear. Heart size is normal. No pneumothorax or pleural effusion. IMPRESSION: No acute disease. Electronically Signed   By:  Inge Rise M.D.   On: 04/29/2016 07:33   I have personally reviewed and evaluated these images and lab results as part of my medical decision-making.   EKG Interpretation   Date/Time:  Wednesday April 29 2016 06:45:36 EDT Ventricular Rate:  78 PR Interval:    QRS Duration: 148 QT Interval:  452 QTC Calculation: 515 R Axis:   -26 Text Interpretation:  Sinus rhythm Short PR interval Right bundle branch  block T wave inversions laterally new. v. 6/17 Reconfirmed by Jeneen Rinks  MD,  [place] ([id number]) on 04/29/2016 7:21:38 AM      MDM   Final diagnoses:  NSTEMI (non-ST elevated myocardial infarction) ([place])  Iron deficiency anemia due to chronic blood loss    Patient's EKG shows inverted T waves laterally versus comparison. A persistent right bundle-branch block. First troponin I, i-STAT elevated 0.34. He remains pain-free. Constellation of symptoms and findings consistent with in the STEMI. Given aspirin. His guaiac-negative. Has dropped her hemoglobin from 10.8, 29.1 since June 5. Type and screen requested.  Care discussed with [facility]. They will admit the patient with consideration for his multiple comorbidities. Also discussed  with PA for cardiology. Patient will be seen in consultation by cardiology. He remains symptom-free.  I discussed with him long-term care plans. He does wish to be considered a full code. He would be agreeable to heart catheterization should that be recommended by consultation with cardiology. He is not opposed to blood transfusion should it become necessary. Type and screen is ordered.  He remained symptom-free in the emergency room.     Tanna Furry, MD 04/29/16 [phone]

## 2016-04-29 NOTE — Plan of Care (Signed)
Problem: Phase I Progression Outcomes Goal: Aspirin unless contraindicated Outcome: Completed/Met Date Met:  04/29/16 Given in ED

## 2016-04-29 NOTE — ED Notes (Signed)
Patient up ambulatory to the bathroom at this time; visitor at bedside

## 2016-04-29 NOTE — ED Notes (Signed)
Pt from [facility]. Pt started having chest pain at 0530 on and off radiating to the left arm. No N/V or SOB.

## 2016-04-29 NOTE — H&P (Signed)
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 Had a similar episode yesterday which quickly subsided, therefore he did not seek medical attention. Pain notworsened with deep inspiration, movement or exertion. Denies worsening dyspnea or cough. Denies nausea, vomiting or diaphoresis. Since admission, he had no further episodes  Denies any dizziness or falls.  Denies any fever or chills.Denies any abdominal pain, appetite is normal andeats salt rich foods. Patient had an acute on chronic episode of black tarry stools 2-3 days ago, but he attributes it to Dexilant (Iron supply)  Has chronic swelling  But denies calf pain. Denies any headaches or vision changes. Denies any seizures or confusion. Denies any excess of NSAIDS and takes ASA 81 mg daily .    ED Course:  BP 147/65 mmHg  Pulse 74  Temp(Src) 97.6 F (36.4 C) (Oral)  Resp 21  SpO2 99%  Patient took ASA 324 mg on arrival with relief of symptoms  Tn 0.34, other Tn pending   EKG today Sinus rhythm Short PR interval Right bundle branch block T wave inversions laterally new since last EKG. QTC 515   2 D echo 07/2015 nl LVF, EF  55% to 60%.  grade 1 diastolic dysfunction Hb 9.1  Hcult negative  Upper endoscopy in 2014 by [facility] showed 2 small varices with erosive gastritis and duodenitis with nonbleeding ulcers. H pilori negative Most recent GI evaluation at the ED on 6/5 by Dr. Carlean Purl when patient presented with GIB, better controlled with PPIs and for future  endoscopy soon  EGFR 32, Cr 1.83   Review of Systems: As per HPI otherwise 10 point review of systems negative.   Past Medical History  Diagnosis Date  . Hypertrophic cardiomyopathy ([place])   . HTN (hypertension)   . HLD (hyperlipidemia)   . Alcoholic cirrhosis ([place])   . GERD (gastroesophageal reflux disease)   . Renal insufficiency   . Alcoholism ([place])   . 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 Rhabdomyolysis 01/2005  . Colon polyp perhaps in 1990s    pathology/type not known  . Diverticulosis of colon perhaps in 1990s  . Ascites 11/2004    "early" SBP on paracentesis.   . Portal hypertensive gastropathy 2006  . Fracture of wrist 2014 spring vs early summer    refused orthopods suggestion to set the bone.   . CKD (chronic kidney disease), stage III 10/11/2015    Past Surgical History  Procedure Laterality Date  . Inguinal hernia repair    . Melanoma excision Left 02/2011    malignant melanoma of thigh  . Basal cell carcinoma excision Left 11/2012    lower eyelid  . Esophagogastroduodenoscopy N/A 08/04/2013    Procedure: ESOPHAGOGASTRODUODENOSCOPY (EGD);  Surgeon: Ladene Artist, MD;  Location: [facility] ENDOSCOPY;  Service: Endoscopy;  Laterality: N/A;    Social History Social History   Social History  . Marital Status: Married    Spouse Name: N/A  . Number of Children: 4  . Years of Education: 15  Occupational History  . pathologist     retired   Social History Main Topics  . Smoking status: Never Smoker   . Smokeless tobacco: Never Used  . Alcohol Use: No     Comment: member of AA  . Drug Use: No  . Sexual Activity: Not on file   Other Topics Concern  . 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  Remains independent. ACP- not discussed, will approach at next OV     No Known Allergies  Family History  Problem Relation Age of Onset  . Cancer Father 61    lung cancer  . Heart disease Mother   . Heart  disease Sister       Prior to Admission medications   Medication Sig Start Date End Date Taking? Authorizing Provider  acetaminophen (TYLENOL) 325 MG tablet Take 650 mg by mouth every 6 (six) hours as needed for pain.   Yes Historical Provider, MD  amLODipine (NORVASC) 10 MG tablet Take 1 tablet (10 mg total) by mouth daily. 01/27/16  Yes Burnell Blanks, MD  DEXILANT 60 MG capsule TAKE ONE CAPSULE EACH DAY 08/16/15  Yes Biagio Borg, MD  furosemide (LASIX) 40 MG tablet Take 1 tablet (40 mg total) by mouth daily. 01/22/16  Yes Burnell Blanks, MD  metoprolol succinate (TOPROL-XL) 100 MG 24 hr tablet TAKE ONE AND ONE-HALF TABLETS DAILY [facility] IMMEDIATELY FOLLOWING A MEAL 12/23/15  Yes Biagio Borg, MD  Multiple Vitamin (MULTIVITAMIN WITH MINERALS) TABS Take 1 tablet by mouth daily.   Yes Historical Provider, MD  pantoprazole (PROTONIX) 20 MG tablet Take 2 tablets (40 mg total) by mouth daily. 04/13/16  Yes Nicole Pisciotta, PA-C  simvastatin (ZOCOR) 20 MG tablet TAKE ONE TABLET AT BEDTIME 03/06/16  Yes Biagio Borg, MD  spironolactone (ALDACTONE) 25 MG tablet TAKE ONE TABLET EACH DAY 02/12/16  Yes Biagio Borg, MD  zolpidem (AMBIEN) 5 MG tablet TAKE ONE TABLET AT BEDTIME AS NEEDED FORSLEEP 01/28/16  Yes Biagio Borg, MD    Physical Exam:    Filed Vitals:   04/29/16 0645 04/29/16 0652 04/29/16 0700  BP: 151/61  147/65  Pulse: 75  74  Temp:  97.6 F (36.4 C)   TempSrc:  Oral   Resp: 17  21  SpO2: 100%  99%       Constitutional: NAD, calm, comfortable   Filed Vitals:   04/29/16 0645 04/29/16 0652 04/29/16 0700  BP: 151/61  147/65  Pulse: 75  74  Temp:  97.6 F (36.4 C)   TempSrc:  Oral   Resp: 17  21  SpO2: 100%  99%   Eyes: PERRL, lids and conjunctivae normal ENMT: Mucous membranes are moist. Posterior pharynx clear of any exudate or lesions.Normal dentition.  Neck: normal, supple, no masses, no thyromegaly Respiratory:  Trace bibasilar crackles, no wheezing or  rhonchi . Normal respiratory effort. No accessory muscle use.  Cardiovascular: Regular rate and rhythm, 2/6 no murmurs / rubs / gallops. 1-2 + bilateral  extremity edema. 2+ pedal pulses. No carotid bruits.  Abdomen: no tenderness, no masses palpated. No hepatosplenomegaly. Bowel sounds positive.  Musculoskeletal: no clubbing / cyanosis. No joint deformity upper and lower extremities. Good ROM, no contractures. Normal muscle tone.  Skin: no telangectasia or rash. Some keratotic chages in extremities Neurologic: CN 2-12 grossly intact. Sensation intact, DTR normal. Strength 5/5 in all 4. Lipoma on left elbow Psychiatric:  Normal judgment and insight. Alert and oriented x 3. Normal mood.     Labs on Admission: I have personally reviewed following labs and imaging studies  CBC:  Recent Labs Lab 04/29/16 0656  WBC 7.9  HGB 9.1*  HCT 28.8*  MCV 97.3  PLT 254    Basic Metabolic Panel:  Recent Labs Lab 04/29/16 0656  NA 138  K 4.7  CL 107  CO2 21*  GLUCOSE 112*  BUN 45*  CREATININE 1.83*  CALCIUM 9.2    GFR: CrCl cannot be calculated (Unknown ideal weight.).  Liver Function Tests: No results for input(s): AST, ALT, ALKPHOS, BILITOT, PROT, ALBUMIN in the last 168 hours. No results for input(s): LIPASE, AMYLASE in the last 168 hours. No results for input(s): AMMONIA in the last 168 hours.  Coagulation Profile:  Recent Labs Lab 04/29/16 0656  INR 1.18    Cardiac Enzymes:  Recent Labs Lab 04/29/16 0656  TROPONINI 0.34*    BNP (last 3 results)  Recent Labs  07/22/15 1536  PROBNP 61.0    HbA1C: No results for input(s): HGBA1C in the last 72 hours.  CBG: No results for input(s): GLUCAP in the last 168 hours.  Lipid Profile: No results for input(s): CHOL, HDL, LDLCALC, TRIG, CHOLHDL, LDLDIRECT in the last 72 hours.  Thyroid Function Tests: No results for input(s): TSH, T4TOTAL, FREET4, T3FREE, THYROIDAB in the last 72 hours.  Anemia Panel: No  results for input(s): VITAMINB12, FOLATE, FERRITIN, TIBC, IRON, RETICCTPCT in the last 72 hours.  Urine analysis:    Component Value Date/Time   COLORURINE YELLOW 04/13/2016 1154   APPEARANCEUR CLEAR 04/13/2016 1154   LABSPEC 1.013 04/13/2016 1154   PHURINE 5.0 04/13/2016 1154   GLUCOSEU NEGATIVE 04/13/2016 1154   GLUCOSEU NEGATIVE 07/17/2015 1148   HGBUR NEGATIVE 04/13/2016 1154   BILIRUBINUR NEGATIVE 04/13/2016 1154   KETONESUR NEGATIVE 04/13/2016 1154   PROTEINUR 30* 04/13/2016 1154   UROBILINOGEN 0.2 07/17/2015 1148   NITRITE NEGATIVE 04/13/2016 1154   LEUKOCYTESUR NEGATIVE 04/13/2016 1154    Sepsis Labs: _0 (procalcitonin:4,lacticidven:4) )No results found for this or any previous visit (from the past 240 hour(s)).   Radiological Exams on Admission: Dg Chest 2 View  04/29/2016  CLINICAL DATA:  Chest pain and shortness of breath for 2 days. EXAM: CHEST  2 VIEW COMPARISON:  Single-view of the chest 04/13/2016. PA and lateral chest 10/04/2015. FINDINGS: Elevation of the right hemidiaphragm relative to the left is unchanged. The lungs are clear. Heart size is normal. No pneumothorax or pleural effusion. IMPRESSION: No acute disease. Electronically Signed   By: Inge Rise M.D.   On: 04/29/2016 07:33    EKG: Independently reviewed.  Assessment/Plan Principal Problem:   NSTEMI (non-ST elevated myocardial infarction) ([place]) Active Problems:   Hyperlipidemia   HYPERTENSION, BENIGN   Hypertrophic obstructive cardiomyopathy (H[facility])   Edema   Alcoholic cirrhosis ([facility])   CKD (chronic kidney disease), stage III   Chest pain syndrome/known CAD  HEART score 8. Troponin 0.34, EKG with new lateral TWI suspicious for NSTEMI.  CXR unrevealing.  2 D echo 07/2015 nl LVF, EF  55% to 60%. grade 1 diastolic dysfunction. Cannot find  Prior cath info.  Admit to Inpatient telemetry Chest pain order set Cycle troponins Serial EKG continue ASA, O2 and NTG as needed Continue  preadmission beta blocker and nitrate Statins  GI cocktail Consult to Cards, patient will be kept NPO with sips for meds for possible catheterization  Other plans as per Cards  Hypertension BP 147/65 mmHg  Pulse 74 Controlled Continue home anti-hypertensive medications  Add Hydralazine Q6 hours as needed for BP 160/90    Hyperlipidemia Continue home statins Check lipid panel   History of  Gastro Intestinal Bleed with anemia of Iron deficiency, without acute bleeding issues at this time Upper endoscopy in 2014 by [facility] showed 2 small varices with erosive gastritis and duodenitis with nonbleeding ulcers. H pilori negative. Most recent GI evaluation at the ED on 6/5 by Dr. Carlean Purl when patient presented with GIB, better controlled with PPIs and for future endoscopy as OP Continue PPI  Will consult GI if active GIB occurs  Anemia of chronic disease, h/o GIB and CKD  Baseline Hgb is 10-11, current Hgb is 9.1, Hcult neg Will type and screen,  and transfuse for Hgb less than 8 Continue po Iron while in hospital Repeat CBC in am    Prior History of ETOH,  History of alcoholic cirrhosis and portal hypertension INR 1.18. PLts normal at 236  Followed as OP by GI  No bleeding issues at this time  No Vit K is indicated at this time unless is recommended by Cards     Chronic kidney disease stage  IIIB  baseline creatinine 1.9  Lab Results  Component Value Date   CREATININE 1.83* 04/29/2016   CREATININE 1.90* 04/13/2016   CREATININE 1.90* 04/13/2016  IVF Repeat CMET in am   DVT prophylaxis: SCDs due to h/o GIB, other plans as per Cards  Code Status:   Full    Family Communication:  Discussed with patient  Disposition Plan: Expect patient to be discharged to retirement home after condition improves Consults called:    None Admission status: Inpatient tele   Rondel Jumbo, PA-C [facility]   If 7PM-7AM, please contact night-coverage [email or web address] Password  [facility]  04/29/2016, 9:28 AM

## 2016-04-29 NOTE — ED Notes (Signed)
Attempted report 5W.

## 2016-04-29 NOTE — Care Management Note (Signed)
Case Management Note  Patient Details  Name: [name] MRN: [id number] Date of Birth: 06-01-1929  Subjective/Objective:                  82 yo male pt started having chest pain at 0530 on and off radiating to the left arm. /Pt from [facility] with spouse.   Action/Plan: Follow for disposition needs.   Expected Discharge Date:  05/02/16               Expected Discharge Plan:  Assisted Living / Rest Home  In-House Referral:  Clinical Social Work  Discharge planning Services  CM Consult  Post Acute Care Choice:    Choice offered to:     DME Arranged:    DME Agency:     HH Arranged:    HH Agency:     Status of Service:  In process, will continue to follow  If discussed at Long Length of Stay Meetings, dates discussed:    Additional Comments:  Fuller Mandril, RN 04/29/2016, 9:12 AM

## 2016-04-29 NOTE — Consult Note (Signed)
CARDIOLOGY CONSULT NOTE   Patient ID: JACORI NAGARAJAN MRN: [id number] DOB/AGE: 80/08/1929 80 y.o.  Admit date: 04/29/2016  Primary Physician   [name], MD Primary Cardiologist   Dr. Angelena Form Reason for Consultation   Chest pain Requesting Physician  Dr.   HPI: COLBERT BUREK is a 82 y.o. male with a history of hypertrophic cardiomyopathy, HTN, HLD, RBBB, chronic lower extremity edema, alcohol abuse with previous ascites, portal gastropathy, coagulopathy and mental status issues who presented to [facility] ED for evaluation of chest pain. He has been followed in the past by Dr. Lia Foyer. Last echo 07/26/15 with moderate basal septal hypertrophy with no clear HOCM physiology, normal LV function. He is a retired Industrial/product designer. He was doing well when last seen by Dr.  Angelena Form 02/13/2016.  Patient was seen in ER 04/13/2016 for dark stool x 2 weeks with intermittent shortness of breath. He had positive FOBT. The patient was seen by GI and advised to start back on PPI and cut back on NSAID. He was discharged from ED with plan to follow-up with Dr. Fuller Plan  (GI) July 2017 with possible GED. Prior hx hx of GIB from duodenal ulcers, small esophageal varices noted.  The patient reports to having left anterior chest pain with left shoulder chest pain yesterday lasting for about an hour and resolved by itself. He woke up this morning at 5:30 AM with similar pain which lasted for 20 minutes and resolved. He describes the pain as a pressure. No radiation to arm but has left shoulder pain. Had diaphoresis yesterday but not today. No associated shortness of breath or nausea. Patient has a chronic lower extremity edema. Denies orthopnea, PND, syncope, dizziness, palpitation. Denies further dark or blood in his stool.  In ED, he was given aspirin 324 mg. Lab work notable for point-of-care troponin 0.34, troponin I of 0.34, creatinine of 1.83, hemoglobin 9.1 which has decreased from 11. 2 when seen in ER  04/13/16. Negative FOBT today. Chest x-ray does not show any acute changes. EKG shows normal sinus rhythm at rate of 71 bpm, chronic right bundle branch block and new T-wave inversion in the I and aVL. Currently chest pain-free.   Past Medical History  Diagnosis Date  . Hypertrophic cardiomyopathy ([place])   . HTN (hypertension)   . HLD (hyperlipidemia)   . Alcoholic cirrhosis ([place])   . GERD (gastroesophageal reflux disease)   . Renal insufficiency   . Alcoholism ([place])   . 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 Rhabdomyolysis 01/2005  . Colon polyp perhaps in 1990s    pathology/type not known  . Diverticulosis of colon perhaps in 1990s  . Ascites 11/2004    "early" SBP on paracentesis.   . Portal hypertensive gastropathy 2006  . Fracture of wrist 2014 spring vs early summer    refused orthopods suggestion to set the bone.   . CKD (chronic kidney disease), stage III 10/11/2015     Past Surgical History  Procedure Laterality Date  . Inguinal hernia repair    . Melanoma excision Left 02/2011    malignant melanoma of thigh  . Basal cell carcinoma excision Left 11/2012    lower eyelid  . Esophagogastroduodenoscopy N/A 08/04/2013    Procedure: ESOPHAGOGASTRODUODENOSCOPY (EGD);  Surgeon: Ladene Artist, MD;  Location: [facility] ENDOSCOPY;  Service: Endoscopy;  Laterality: N/A;    No Known Allergies  I have reviewed the patient's current medications     Prior to Admission medications   Medication Sig Start  Date End Date Taking? Authorizing Provider  acetaminophen (TYLENOL) 325 MG tablet Take 650 mg by mouth every 6 (six) hours as needed for pain.   Yes Historical Provider, MD  amLODipine (NORVASC) 10 MG tablet Take 1 tablet (10 mg total) by mouth daily. 01/27/16  Yes Burnell Blanks, MD  DEXILANT 60 MG capsule TAKE ONE CAPSULE EACH DAY 08/16/15  Yes Biagio Borg, MD  furosemide (LASIX) 40 MG tablet Take 1 tablet (40 mg total) by mouth daily. 01/22/16  Yes Burnell Blanks, MD    metoprolol succinate (TOPROL-XL) 100 MG 24 hr tablet TAKE ONE AND ONE-HALF TABLETS DAILY [facility] IMMEDIATELY FOLLOWING A MEAL 12/23/15  Yes Biagio Borg, MD  Multiple Vitamin (MULTIVITAMIN WITH MINERALS) TABS Take 1 tablet by mouth daily.   Yes Historical Provider, MD  pantoprazole (PROTONIX) 20 MG tablet Take 2 tablets (40 mg total) by mouth daily. 04/13/16  Yes Nicole Pisciotta, PA-C  simvastatin (ZOCOR) 20 MG tablet TAKE ONE TABLET AT BEDTIME 03/06/16  Yes Biagio Borg, MD  spironolactone (ALDACTONE) 25 MG tablet TAKE ONE TABLET EACH DAY 02/12/16  Yes Biagio Borg, MD  zolpidem (AMBIEN) 5 MG tablet TAKE ONE TABLET AT BEDTIME AS NEEDED FORSLEEP 01/28/16  Yes Biagio Borg, MD     Social History   Social History  . Marital Status: Married    Spouse Name: N/A  . Number of Children: 4  . Years of Education: 24   Occupational History  . pathologist     retired   Social History Main Topics  . Smoking status: Never Smoker   . Smokeless tobacco: Never Used  . Alcohol Use: No     Comment: member of AA  . Drug Use: No  . Sexual Activity: Not on file   Other Topics Concern  . 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  Remains independent. ACP- not discussed, will approach at next OV    Family Status  Relation Status Death Age  . Father Deceased   . Mother Deceased   . Sister Deceased    Family History  Problem Relation Age of Onset  . Cancer Father 7    lung cancer  . Heart disease Mother   . Heart disease Sister        ROS:  Full 14 point review of systems complete and found to be negative unless listed above.  Physical Exam: Blood pressure 147/65, pulse 74, temperature 97.6 F (36.4 C), temperature source Oral, resp. rate 21, SpO2 99 %.  General: Well developed, well nourished, male in no acute distress Head: Eyes PERRLA, No xanthomas. Normocephalic and atraumatic, oropharynx without edema or exudate.  Lungs: Resp  regular and unlabored, CTA. Heart: RRR no s3, s4 . 2/6 systolic murmur Neck: No carotid bruits. No lymphadenopathy.  No JVD. Abdomen: Bowel sounds present, abdomen soft and non-tender without masses or hernias noted. Msk:  No spine or cva tenderness. No weakness, no joint deformities or effusions. Extremities: No clubbing, cyanosis. 1-2 + BL LE edema with skin changes.  DP/PT/Radials 2+ and equal bilaterally. Neuro: Alert and oriented X 3. No focal deficits noted. Psych:  Good affect, responds appropriately Skin: No rashes or lesions noted.  Labs:   Lab Results  Component Value Date   WBC 7.9 04/29/2016   HGB 9.1* 04/29/2016   HCT 28.8* 04/29/2016   MCV 97.3 04/29/2016   PLT 236 04/29/2016  Recent Labs  04/29/16 0656  INR 1.18    Recent Labs Lab 04/29/16 0656  NA 138  K 4.7  CL 107  CO2 21*  BUN 45*  CREATININE 1.83*  CALCIUM 9.2  GLUCOSE 112*   No results found for: MG  Recent Labs  04/29/16 0656  TROPONINI 0.34*    Recent Labs  04/29/16 0702  TROPIPOC 0.34*   PRO B NATRIURETIC PEPTIDE (BNP)  Date/Time Value Ref Range Status  07/22/2015 03:36 PM 61.0 0.0 - 100.0 pg/mL Final  08/04/2013 04:00 PM 191.7 0 - 450 pg/mL Final   Lab Results  Component Value Date   CHOL 106 02/22/2015   HDL 45.60 02/22/2015   LDLCALC 33 02/22/2015   TRIG 138.0 02/22/2015   Echo:04/2015  LV EF: 55% - 60%  ------------------------------------------------------------------- Indications: R06.00 Dyspnea.  ------------------------------------------------------------------- History: PMH: Acquired from the patient and from the patient&'s chart. PMH: Alcoholic Cirrhosis. Ascites. Renal Insufficiency. HOCM. Portal Hypertension Gastrophy. Risk factors: Alcohol Abuse. Hypertension. Dyslipidemia.  ------------------------------------------------------------------- Study Conclusions  - Left ventricle: Moderate basal septal hypertrophy no SAM or  evidnece  of HOCM. The cavity size was normal. Systolic function  was normal. The estimated ejection fraction was in the range of  55% to 60%. Wall motion was normal; there were no regional wall  motion abnormalities. Doppler parameters are consistent with  abnormal left ventricular relaxation (grade 1 diastolic  dysfunction). - Aortic valve: There was mild regurgitation. - Mitral valve: Calcified annulus. Mildly thickened leaflets . - Left atrium: The atrium was moderately dilated. - Atrial septum: No defect or patent foramen ovale was identified.   ECG:  EKG shows normal sinus rhythm at rate of 71 bpm, chronic right bundle branch block and new T-wave inversion in the I and aVL.  Radiology:  Dg Chest 2 View  04/29/2016  CLINICAL DATA:  Chest pain and shortness of breath for 2 days. EXAM: CHEST  2 VIEW COMPARISON:  Single-view of the chest 04/13/2016. PA and lateral chest 10/04/2015. FINDINGS: Elevation of the right hemidiaphragm relative to the left is unchanged. The lungs are clear. Heart size is normal. No pneumothorax or pleural effusion. IMPRESSION: No acute disease. Electronically Signed   By: Inge Rise M.D.   On: 04/29/2016 07:33    ASSESSMENT AND PLAN:     1.  NSTEMI (non-ST elevated myocardial infarction) ([place]) - His chest pain is concerning for unstable angina. Troponin  of 0.34. New T-wave inversion in lateral lead. Continue cycle troponin and serial EKG. Will get echocardiogram. He can eat today. NPO after mid night.  We will make a decision tomorrow in the morning for stress test versus cath  depending on echocardiogram and troponin trend. If cardiac catheterization required he will need GI clearance prior to this due to recent positive FOBT. Aspirin 325mg  given this morning. Continue Toprol and Statin. Start Imdur 30mg .   2. Hypertrophic cardiomyopathy:  - Echo as above September 2016 with basal septal hypertrophy but with no physiology of HOCM. As above.   3. Lower ext  edema, chronic:  -Likely dependent edema. Still with 2+ edema. Continue Lasix 40 mg daily. He states that he is not compliant with salt intake which has discussed previously.   4. Hyperlipidemia:  -He is on a statin but does not want his lipids checked.   5. Chronic kidney disease stage III - Creatinine of 1.83. Baseline 1.8-1.9. He is at risk for having acute kidney injury if cath is required.  6. Anemia - Hgb of  9.1 today. Need inpatient GI evaluation.   7. HTN - Minimally elevated. Continue BB and Norvasc. Will add Imdur.    SignedLeanor Kail, [place] 04/29/2016, 9:06 AM Pager [phone]  Co-Sign MD Patient seen and examined and history reviewed. Agree with above findings and plan. 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 He has a history of HCM with predominant basal septal hypertrophy and cavity obliteration by prior Echo. No outflow gradient. Also history of chronic Etoh abuse, hepatic disease with portal gastropathy, CKD stage 3, and anemia. No prior cardiac ischemic evaluation that I can find. Was in the ED 2 weeks ago with black tarry stools. Heme positive. 2 nights ago developed left precordial chest pain that resolved. Last night pain recurred and radiated to his left shoulder. Thinks it lasted one hour. Still has slight heaviness. Patient is generally very sedentary. Reports tarry stool 2 days ago. No dyspnea. On exam he is a frail elderly WM in NAD. VSS Lungs clear. CV RRR with harsh gr 2/6 systolic murmur across the precordium.  Abdomen soft and NT. Chronic 2+ LE edema with hyperkeratosis.  Troponin elevated 0.34>0.28.  Ecg shows NSR with first degree aV block and RBBB. New T wave inversion in leads 1, Avl c/w lateral ischemia. Hgb down to 9.1 compared to 11.2 2 weeks ago.  CKD stable  Impression: NSTEMI. Suspect primary ACS although this may represent demand ischemia in setting of GI bleed with known HCM. Will cycle cardiac enzymes and Ecg.  Repeat Echo. Continue ASA, metoprolol, amlodipine. Add oral nitrates. Patient at high risk for cardiac cath given CKD and evidence of recent GI bleed. Will need GI clearance prior to invasive evaluation.  GI bleed with worsening anemia. Patient requests transfusion. Will defer to medical team. GI to see. HCM. Murmur more prominent in setting of anemia.   Vance Hochmuth [place], [place] 04/29/2016 10:04 AM

## 2016-04-30 DIAGNOSIS — K2970 Gastritis, unspecified, without bleeding: Secondary | ICD-10-CM

## 2016-04-30 DIAGNOSIS — R609 Edema, unspecified: Secondary | ICD-10-CM

## 2016-04-30 DIAGNOSIS — K7030 Alcoholic cirrhosis of liver without ascites: Secondary | ICD-10-CM

## 2016-04-30 DIAGNOSIS — R195 Other fecal abnormalities: Secondary | ICD-10-CM

## 2016-04-30 DIAGNOSIS — E785 Hyperlipidemia, unspecified: Secondary | ICD-10-CM

## 2016-04-30 LAB — COMPREHENSIVE METABOLIC PANEL
ALK PHOS: 63 U/L (ref 38–126)
ALT: 15 U/L — AB (ref 17–63)
AST: 19 U/L (ref 15–41)
Albumin: 2.7 g/dL — ABNORMAL LOW (ref 3.5–5.0)
Anion gap: 6 (ref 5–15)
BUN: 34 mg/dL — AB (ref 6–20)
CALCIUM: 8.3 mg/dL — AB (ref 8.9–10.3)
CHLORIDE: 107 mmol/L (ref 101–111)
CO2: 21 mmol/L — ABNORMAL LOW (ref 22–32)
CREATININE: 1.48 mg/dL — AB (ref 0.61–1.24)
GFR, EST AFRICAN AMERICAN: 48 mL/min — AB (ref >60)
GFR, EST NON AFRICAN AMERICAN: 42 mL/min — AB (ref >60)
Glucose, Bld: 99 mg/dL (ref 65–99)
Potassium: 4.6 mmol/L (ref 3.5–5.1)
Sodium: 134 mmol/L — ABNORMAL LOW (ref 135–145)
Total Bilirubin: 0.6 mg/dL (ref 0.3–1.2)
Total Protein: 5.5 g/dL — ABNORMAL LOW (ref 6.5–8.1)

## 2016-04-30 LAB — PREPARE RBC (CROSSMATCH)

## 2016-04-30 LAB — HEMOGLOBIN AND HEMATOCRIT, BLOOD
HEMATOCRIT: 33.5 % — AB (ref 39.0–52.0)
HEMOGLOBIN: 11 g/dL — AB (ref 13.0–17.0)

## 2016-04-30 LAB — CBC
HCT: 25.6 % — ABNORMAL LOW (ref 39.0–52.0)
Hemoglobin: 8.2 g/dL — ABNORMAL LOW (ref 13.0–17.0)
MCH: 31.4 pg (ref 26.0–34.0)
MCHC: 32 g/dL (ref 30.0–36.0)
MCV: 98.1 fL (ref 78.0–100.0)
PLATELETS: 205 10*3/uL (ref 150–400)
RBC: 2.61 MIL/uL — AB (ref 4.22–5.81)
RDW: 13.9 % (ref 11.5–15.5)
WBC: 7.5 10*3/uL (ref 4.0–10.5)

## 2016-04-30 LAB — LIPID PANEL
CHOLESTEROL: 93 mg/dL (ref 0–200)
HDL: 32 mg/dL — ABNORMAL LOW (ref >40)
LDL CALC: 35 mg/dL (ref 0–99)
TRIGLYCERIDES: 129 mg/dL (ref <150)
Total CHOL/HDL Ratio: 2.9 RATIO
VLDL: 26 mg/dL (ref 0–40)

## 2016-04-30 LAB — PROTIME-INR
INR: 1.13 (ref 0.00–1.49)
PROTHROMBIN TIME: 14.7 s (ref 11.6–15.2)

## 2016-04-30 MED ORDER — SODIUM CHLORIDE 0.9 % IV SOLN: INTRAVENOUS | Status: DC

## 2016-04-30 MED ORDER — PANTOPRAZOLE SODIUM 40 MG PO TBEC
40.0000 mg | DELAYED_RELEASE_TABLET | Freq: Two times a day (BID) | ORAL | Status: DC
  Administered 2016-05-01 – 2016-05-02 (×3): 40 mg via ORAL
  Filled 2016-04-30 (×3): qty 1

## 2016-04-30 MED ORDER — SODIUM CHLORIDE 0.9 % IV SOLN: Freq: Once | INTRAVENOUS | Status: DC

## 2016-04-30 NOTE — Consult Note (Signed)
[facility] Consult: 9:45 AM 04/30/2016  LOS: 1 day    Referring Provider: Dr Wynetta Emery.   Primary Care Physician:  [name], MD Primary Gastroenterologist:  Dr. Fuller Plan     Reason for Consultation:  GI clearance for cardiac cath.    HPI: [name] is a 82 y.o. male. Retired Industrial/product designer. Alcoholic with cirrhosis, Child's A as of 06/2011.  Says he has been sober since 2014. Chronic IDA. Htn. Hypertrophic CM. Impaired memory. CKD stage 3. Hypertrophic CM. Chronic, dependent LE edema. Previous ascites and paracentesis 2006, no ascites on 05/2011 ultrasound. Degenerative disc/spine disease and compression fracture.   07/2013 EGD for melena, Hgb 9.6. : 2 columns, small varices, distal esophagus. Gastritis. Non-bleeding duodenal ulcers, erosive duodenitis. Serum H Pylori negative. Did not require PRBCs then.  11/2004 EGD: mild portal gastropathy.  Colonoscopy remotely (well before 2006). + colon polyps of unknown type (pt can not recall specific pathology). Pt refused repeat colonoscopy, despite + fm hx colon cancer in father.   Sent to ED on 6/5 for 2 weeks of dark stools.  Denied n/v/d/melena, abd pain, norexia, dysphagia. was not taking PPI according to his hand -written med list and was taking 400 mg Ibuprofen daily. Hgb was 10.8 and 11.2, c/w ~ 12.5 from 07/2015 through 10/2015. MCV stable at 95. Platelets normal. FOBT +.   Insisted on being discharged from ED, Dr Carlean Purl was ok with this as stool was [place] (though FOBT +).  Pt advised to resume Dexilant.  GI visit with Dr Fuller Plan set for 7/10.  Until yesterday, had not had repeat CBC.   Presented with chest pain starting at 5 AM 6/21, woke him up from sleep.  Radiation to left arm and jaw.  Similar, breif episode on 6/20. Since arrival to  ED 6/21: no recurrent CP.   Troponins 0.34 to 0.27.  Rule in for Non STEMI.   Hgb 9.1... 8.2.  Platelets, coags normal.  Stool is now FOBT negative. Stable CKD, stage 3.  Denies black or tarry stool since 6/5.  Unable to confirm for me that his is taking PPI (Dexilant) or has avoided Ibuprofen.  His pills are set up on table and he just takes them.    Cardiology would like to cath pt but want GI to assess bleeding risk ("GI clearance") prior to cath.       Past Medical History  Diagnosis Date  . Hypertrophic cardiomyopathy ([place])   . HTN (hypertension)   . HLD (hyperlipidemia)   . Alcoholic cirrhosis ([place])   . GERD (gastroesophageal reflux disease)   . Renal insufficiency   . Alcoholism ([place])   . 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 Rhabdomyolysis 01/2005  . Colon polyp perhaps in 1990s    pathology/type not known  . Diverticulosis of colon perhaps in 1990s  . Ascites 11/2004    "early" SBP on paracentesis.   . Portal hypertensive gastropathy 2006  . Fracture of wrist 2014 spring vs early summer    refused orthopods suggestion to set the bone.   . CKD (  chronic kidney disease), stage III 10/11/2015    Past Surgical History  Procedure Laterality Date  . Inguinal hernia repair    . Melanoma excision Left 02/2011    malignant melanoma of thigh  . Basal cell carcinoma excision Left 11/2012    lower eyelid  . Esophagogastroduodenoscopy N/A 08/04/2013    Procedure: ESOPHAGOGASTRODUODENOSCOPY (EGD);  Surgeon: Ladene Artist, MD;  Location[facility] ENDOSCOPY;  Service: Endoscopy;  Laterality: N/A;    Prior to Admission medications   Medication Sig Start Date End Date Taking? Authorizing Provider  acetaminophen (TYLENOL) 325 MG tablet Take 650 mg by mouth every 6 (six) hours as needed for pain.   Yes Historical Provider, MD  amLODipine (NORVASC) 10 MG tablet Take 1 tablet (10 mg total) by mouth daily. 01/27/16  Yes Burnell Blanks, MD  aspirin 81 MG tablet Take 81 mg by mouth daily.    Yes Historical Provider, MD  DEXILANT 60 MG capsule TAKE ONE CAPSULE EACH DAY 08/16/15  Yes Biagio Borg, MD  furosemide (LASIX) 40 MG tablet Take 1 tablet (40 mg total) by mouth daily. 01/22/16  Yes Burnell Blanks, MD  metoprolol succinate (TOPROL-XL) 100 MG 24 hr tablet TAKE ONE AND ONE-HALF TABLETS DAILY [facility] IMMEDIATELY FOLLOWING A MEAL 12/23/15  Yes Biagio Borg, MD  Multiple Vitamin (MULTIVITAMIN WITH MINERALS) TABS Take 1 tablet by mouth daily.   Yes Historical Provider, MD  pantoprazole (PROTONIX) 20 MG tablet Take 2 tablets (40 mg total) by mouth daily. 04/13/16  Yes Nicole Pisciotta, PA-C  simvastatin (ZOCOR) 20 MG tablet TAKE ONE TABLET AT BEDTIME 03/06/16  Yes Biagio Borg, MD  spironolactone (ALDACTONE) 25 MG tablet TAKE ONE TABLET EACH DAY 02/12/16  Yes Biagio Borg, MD  zolpidem (AMBIEN) 5 MG tablet TAKE ONE TABLET AT BEDTIME AS NEEDED FORSLEEP 01/28/16  Yes Biagio Borg, MD    Scheduled Meds: . sodium chloride   Intravenous Once  . amLODipine  10 mg Oral Daily  . furosemide  40 mg Oral Daily  . isosorbide mononitrate  30 mg Oral Daily  . metoprolol succinate  100 mg Oral Daily  . pantoprazole  40 mg Oral Daily  . simvastatin  20 mg Oral QHS  . sodium chloride flush  3 mL Intravenous Q12H   Infusions: . sodium chloride 75 mL/hr at 04/30/16 0030   PRN Meds: acetaminophen **OR** acetaminophen, bisacodyl, gi cocktail, magnesium citrate, morphine injection, promethazine, senna-docusate, traZODone   Allergies as of 04/29/2016  . (No Known Allergies)    Family History  Problem Relation Age of Onset  . Cancer Father 22    lung cancer  . Heart disease Mother   . Heart disease Sister     Social History   Social History  . Marital Status: Married    Spouse Name: N/A  . Number of Children: 4  . Years of Education: 24   Occupational History  . pathologist     retired   Social History Main Topics  . Smoking status: Never Smoker   . Smokeless tobacco: Never  Used  . Alcohol Use: No     Comment: member of AA  . Drug Use: No  . Sexual Activity: Not on file   Other Topics Concern  . 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  Remains independent. ACP- not discussed, will approach at next OV    REVIEW OF SYSTEMS: Constitutional:  Denies  weakness or falls ENT:  No nose bleeds Pulm:  No SOB or cough CV:  No palpitations, no LE edema.  GU:  No hematuria, no frequency GI:  No dysphagia, no n/v. Heme:  No unusual bleeding or bruising   Transfusions:  None per his recall Neuro:  No headaches, no peripheral tingling or numbness Derm:  No itching, no rash or sores.  Endocrine:  No sweats or chills.  No polyuria or dysuria Immunization:  Reviewed.  Travel:  None beyond local counties in last few months.    PHYSICAL EXAM: Vital signs in last 24 hours: Filed Vitals:   04/29/16 2142 04/30/16 0500  BP: 142/67 127/59  Pulse: 72 80  Temp: 97.7 F (36.5 C) 97.5 F (36.4 C)  Resp: 16    Wt Readings from Last 3 Encounters:  04/30/16 67.178 kg (148 lb 1.6 oz)  04/13/16 73.483 kg (162 lb)  02/13/16 73.755 kg (162 lb 9.6 oz)    General: elderly, somewhat frail WM.  Comfortable.  Not acutely ill looking Head:  No asymmetry, swelling or trauma  Eyes:  No icterus or pallor Ears:  Slightly HOH  Nose:  No discharge Mouth:  Clear, moist.  No sores or exudates Neck:  No mass, no JVD.  No TMG Lungs:  Clear bil.  No cough or SOB Heart: RRR with soft, 1/6 SEM.  S1/S2 present. Abdomen:  Soft, NT.  No mass, no HSM.  Active BS.  No bruits, hernias.  .   Rectal: deferred   Musc/Skeltl: no joint erythema, swelling or gross deformity.  Extremities:  No CCE  Neurologic:  Oriented to self, place, year.  His recall of recent events is impaired (unable to say if he had breakfast today ).  Did not recognize me by sight, though we had long interaction in ED on 6/5.   Skin:  No telangectasia, no large  hematomas Tattoos:  none   Psych:  Cooperative, calm.    Intake/Output from previous day: 06/21 0701 - 06/22 0700 In: 1621.3 [P.O.:960; I.V.:661.3] Out: 976 [Urine:975; Stool:1] Intake/Output this shift: Total I/O In: 0  Out: 1 [Stool:1]  LAB RESULTS:  Recent Labs  04/29/16 0656 04/30/16 0422  WBC 7.9 7.5  HGB 9.1* 8.2*  HCT 28.8* 25.6*  PLT 236 205   BMET Lab Results  Component Value Date   NA 134* 04/30/2016   NA 138 04/29/2016   NA 143 04/13/2016   K 4.6 04/30/2016   K 4.7 04/29/2016   K 4.6 04/13/2016   CL 107 04/30/2016   CL 107 04/29/2016   CL 110 04/13/2016   CO2 21* 04/30/2016   CO2 21* 04/29/2016   CO2 19* 04/13/2016   GLUCOSE 99 04/30/2016   GLUCOSE 112* 04/29/2016   GLUCOSE 131* 04/13/2016   BUN 34* 04/30/2016   BUN 45* 04/29/2016   BUN 41* 04/13/2016   CREATININE 1.48* 04/30/2016   CREATININE 1.83* 04/29/2016   CREATININE 1.90* 04/13/2016   CALCIUM 8.3* 04/30/2016   CALCIUM 9.2 04/29/2016   CALCIUM 8.9 04/13/2016   LFT  Recent Labs  04/30/16 0422  PROT 5.5*  ALBUMIN 2.7*  AST 19  ALT 15*  ALKPHOS 63  BILITOT 0.6   PT/INR Lab Results  Component Value Date   INR 1.13 04/30/2016   INR 1.18 04/29/2016   INR 1.21 04/13/2016    RADIOLOGY STUDIES: Dg Chest 2 View  04/29/2016  CLINICAL DATA:  Chest pain and shortness of breath for 2 days. EXAM: CHEST  2  VIEW COMPARISON:  Single-view of the chest 04/13/2016. PA and lateral chest 10/04/2015. FINDINGS: Elevation of the right hemidiaphragm relative to the left is unchanged. The lungs are clear. Heart size is normal. No pneumothorax or pleural effusion. IMPRESSION: No acute disease. Electronically Signed   By: Inge Rise M.D.   On: 04/29/2016 07:33    ENDOSCOPIC STUDIES: Per HPI  IMPRESSION:   *  Non STEMI.  *  Worsening, normocytic anemia.  FOBT +, black stools leading up to 6/5 ED visit.  Hgb is worse but now is FOBT negative.  Pt denies interval black stool.  07/2013 EGD:  gastritis, small esoph varices, non-bleeding duodenal ulcers, erosive duodenitis.   Has refused follow up colonoscopy after latest (well before 2006) at which time had polyps of undetermined type.  ? compliance with Dexilant and avoiding Ibuprofen since 6/5.      *  Stage 3 CKD.  Is his renal function a hindrance to cardiac cath?  This is likely contributing to anemia.   *  Dementia.  The more I speak with pt (this is the 3rd time I have seen him since 2014), the more I am convinced of this.  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  Also can not say with confidence that he is taking Dexilant, nor recall if he had breakfast today (it is now 1020 AM).   *  ETOH Cirrhosis.  Denies ETOH since 07/2013.    PLAN:     *  Per Dr Hilarie Fredrickson. Stay on Protonix or other PPI.  Needs follow up EGD, timing of this to be determined   Azucena Freed  04/30/2016, 9:45 AM Pager: [phone]

## 2016-04-30 NOTE — Progress Notes (Signed)
TELEMETRY: Reviewed telemetry pt in NSR: Filed Vitals:   04/29/16 1300 04/29/16 2142 04/30/16 0500 04/30/16 0946  BP: 92/50 142/67 127/59 150/69  Pulse: 71 72 80 85  Temp: 97.6 F (36.4 C) 97.7 F (36.5 C) 97.5 F (36.4 C)   TempSrc: Oral Oral Oral   Resp: 20 16    Weight:   148 lb 1.6 oz (67.178 kg)   SpO2: 99% 100% 94%     Intake/Output Summary (Last 24 hours) at 04/30/16 1109 Last data filed at 04/30/16 0800  Gross per 24 hour  Intake 1621.25 ml  Output    977 ml  Net 644.25 ml   Filed Weights   04/29/16 1100 04/30/16 0500  Weight: 144 lb 14.4 oz (65.726 kg) 148 lb 1.6 oz (67.178 kg)    Subjective Denies any further chest pain. Appears alert and asks appropriate questions but soon after I left the room he asked me to return and repeat everything we just discussed. No active bleeding seen.  . sodium chloride   Intravenous Once  . amLODipine  10 mg Oral Daily  . furosemide  40 mg Oral Daily  . isosorbide mononitrate  30 mg Oral Daily  . metoprolol succinate  100 mg Oral Daily  . pantoprazole  40 mg Oral Daily  . simvastatin  20 mg Oral QHS  . sodium chloride flush  3 mL Intravenous Q12H   . sodium chloride 75 mL/hr at 04/30/16 0030    LABS: Basic Metabolic Panel:  Recent Labs  04/29/16 0656 04/30/16 0422  NA 138 134*  K 4.7 4.6  CL 107 107  CO2 21* 21*  GLUCOSE 112* 99  BUN 45* 34*  CREATININE 1.83* 1.48*  CALCIUM 9.2 8.3*   Liver Function Tests:  Recent Labs  04/30/16 0422  AST 19  ALT 15*  ALKPHOS 63  BILITOT 0.6  PROT 5.5*  ALBUMIN 2.7*   No results for input(s): LIPASE, AMYLASE in the last 72 hours. CBC:  Recent Labs  04/29/16 0656 04/30/16 0422  WBC 7.9 7.5  HGB 9.1* 8.2*  HCT 28.8* 25.6*  MCV 97.3 98.1  PLT 236 205   Cardiac Enzymes:  Recent Labs  04/29/16 0900 04/29/16 1128 04/29/16 1448  TROPONINI 0.28* 0.28* 0.27*   BNP: No results for input(s): PROBNP in the last 72 hours. D-Dimer: No results for input(s):  DDIMER in the last 72 hours. Hemoglobin A1C: No results for input(s): HGBA1C in the last 72 hours. Fasting Lipid Panel:  Recent Labs  04/30/16 0422  CHOL 93  HDL 32*  LDLCALC 35  TRIG 129  CHOLHDL 2.9   Thyroid Function Tests: No results for input(s): TSH, T4TOTAL, T3FREE, THYROIDAB in the last 72 hours.  Invalid input(s): FREET3   Radiology/Studies:  Dg Chest 2 View  04/29/2016  CLINICAL DATA:  Chest pain and shortness of breath for 2 days. EXAM: CHEST  2 VIEW COMPARISON:  Single-view of the chest 04/13/2016. PA and lateral chest 10/04/2015. FINDINGS: Elevation of the right hemidiaphragm relative to the left is unchanged. The lungs are clear. Heart size is normal. No pneumothorax or pleural effusion. IMPRESSION: No acute disease. Electronically Signed   By: Inge Rise M.D.   On: 04/29/2016 07:33   Ecg not ordered today.  PHYSICAL EXAM General: Well developed, well nourished, male in no acute distress Head: Eyes PERRLA, No xanthomas. Normocephalic and atraumatic, oropharynx without edema or exudate.  Lungs: Resp regular and unlabored, CTA. Heart: RRR no s3, s4 . Harsh  2/6 systolic murmur Neck: No carotid bruits. No lymphadenopathy. No JVD. Abdomen: Bowel sounds present, abdomen soft and non-tender without masses or hernias noted. Msk: No spine or cva tenderness. No weakness, no joint deformities or effusions. Extremities: No clubbing, cyanosis. 1-2 + BL LE edema with skin changes/ hyperkeratosis.  DP/PT/Radials 2+ and equal bilaterally. Neuro: Alert and oriented X 3. No focal deficits noted. Psych: Good affect, responds appropriately Skin: No rashes or lesions noted.   ASSESSMENT AND PLAN: 1. Chest pain with elevated troponin. Troponin trend is flat suggesting this may be more demand ischemia in setting of severe anemia. No recurrent chest pain. Echo is pending. Will repeat Ecg today. He is on triple antianginal therapy with beta blocker, nitrates, and amlodipine. I  am not inclined to pursue further ischemic work up yet given declining Hgb and recent GI bleed. Stress test is not advised in setting of severe anemia. Cardiac cath is risky as well with anemia, CKD, etc. He would not be a candidate for anticoagulation or DAPT at this time with recent bleeding in the event a stent was needed. Will continue optimal medical therapy at this time and monitor for recurrent symptoms. Can consider ischemic work up at a later time depending on stability of other issues. 2. GI bleed. Multiple potential causes with portal gastropathy, history of duodenal ulcers, duodenitis, and colon polyps. Per GI consultants.  3. HCM. I suspect murmur related to this and exacerbated by anemia. Echo is pending. 4. Anemia. Plan transfusion today. 5. CKD stage 3. Creatinine improved. 6. HTN controlled 7. Chronic LE edema.  8. Memory deficit. ? Dementia.   Present on Admission:  . NSTEMI (non-ST elevated myocardial infarction) ([place]) . Hyperlipidemia . HYPERTENSION, BENIGN . Hypertrophic obstructive cardiomyopathy ([place]) . Alcoholic cirrhosis ([place]) . Edema . CKD (chronic kidney disease), stage III  Signed, Dathan Attia [place], [place] 04/30/2016 11:09 AM

## 2016-04-30 NOTE — Evaluation (Signed)
Physical Therapy Evaluation Patient Details Name: [name] MRN: [id number] DOB: 1929-09-18 Today's Date: 04/30/2016   History of Present Illness  KENSUKE PANZARELLA is a 82 y.o. male with a Past Medical History of HTN, Hypertrophic cardiomyopathy, HLD, ETOH cirrhosis, GERD, PUD, diverticulosis, CKD who presents with CP found to have elevated troponin positive for NSTEMI.  Clinical Impression  Patient presents with decreased independence due to deficits listed in PT problem list.  He will benefit from skilled PT in the acute setting to allow return home with family support and follow up HHPT.     Follow Up Recommendations Home health PT (also may benefit from cardiac rehab if MD agrees)    Equipment Recommendations  None recommended by PT    Recommendations for Other Services       Precautions / Restrictions Precautions Precautions: Fall      Mobility  Bed Mobility Overal bed mobility: Needs Assistance Bed Mobility: Supine to Sit     Supine to sit: HOB elevated;Supervision     General bed mobility comments: assist for IV  Transfers Overall transfer level: Needs assistance Equipment used: Straight cane Transfers: Sit to/from Stand Sit to Stand: Supervision         General transfer comment: assist for safety/balance  Ambulation/Gait Ambulation/Gait assistance: Min guard Ambulation Distance (Feet): 150 Feet (with one seated rest due to SOB (HR 95)) Assistive device: Straight cane Gait Pattern/deviations: Step-to pattern;Decreased stride length;Shuffle     General Gait Details: step to pattern with cane on R due to functional scoliosis  Stairs            Wheelchair Mobility    Modified Rankin (Stroke Patients Only)       Balance Overall balance assessment: Needs assistance Sitting-balance support: Feet supported Sitting balance-Leahy Scale: Good     Standing balance support: Single extremity supported Standing balance-Leahy Scale:  Poor Standing balance comment: relies on cane in standing, initially with legs braced against side of bed                             Pertinent Vitals/Pain Pain Assessment: No/denies pain    Home Living Family/patient expects to be discharged to:: Private residence Living Arrangements: Spouse/significant other Available Help at Discharge: Family Type of Home: Independent living facility Home Access: Level entry     Home Layout: One level Home Equipment: Cane - single point;Electric scooter      Prior Function Level of Independence: Independent with assistive device(s)         Comments: uses scooter to go to dining room, cane in apartment     Hand Dominance        Extremity/Trunk Assessment   Upper Extremity Assessment: Overall WFL for tasks assessed (lipoma on R elbow)           Lower Extremity Assessment: Generalized weakness      Cervical / Trunk Assessment: Other exceptions  Communication   Communication: No difficulties;HOH  Cognition Arousal/Alertness: Awake/alert Behavior During Therapy: WFL for tasks assessed/performed Overall Cognitive Status: No family/caregiver present to determine baseline cognitive functioning       Memory: Decreased short-term memory              General Comments General comments (skin integrity, edema, etc.): noted skin changes on legs pt reports skin condition hyperkeratosis    Exercises        Assessment/Plan    PT Assessment Patient needs continued PT  services  PT Diagnosis Abnormality of gait;Generalized weakness   PT Problem List Decreased strength;Decreased activity tolerance;Decreased balance;Decreased mobility;Cardiopulmonary status limiting activity;Decreased safety awareness  PT Treatment Interventions DME instruction;Gait training;Functional mobility training;Therapeutic activities;Therapeutic exercise;Patient/family education;Balance training   PT Goals (Current goals can be found in the  Care Plan section) Acute Rehab PT Goals Patient Stated Goal: To return home PT Goal Formulation: With patient Time For Goal Achievement: 05/07/16 Potential to Achieve Goals: Good    Frequency Min 3X/week   Barriers to discharge        Co-evaluation               End of Session Equipment Utilized During Treatment: Gait belt Activity Tolerance: Patient limited by fatigue Patient left: with call bell/phone within reach;in bed           Time: 1117-1140 PT Time Calculation (min) (ACUTE ONLY): 23 min   Charges:   PT Evaluation $PT Eval Moderate Complexity: 1 Procedure PT Treatments $Gait Training: 8-22 mins   PT G CodesReginia Naas 2016/05/29, 11:51 AM  Magda Kiel, [place] 2016/05/29

## 2016-04-30 NOTE — Progress Notes (Signed)
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 Had a similar episode yesterday which quickly subsided, therefore he did not seek medical attention. Pain notworsened with deep inspiration, movement or exertion. Denies worsening dyspnea or cough. Denies nausea, vomiting or diaphoresis. Since admission, he had no further episodes Denies any dizziness or falls. Denies any fever or chills.Denies any abdominal pain, appetite is normal andeats salt rich foods. Patient had an acute on chronic episode of black tarry stools 2-3 days ago, but he attributes it to Dexilant (Iron supply) Has chronic swelling But denies calf pain. Denies any headaches or vision changes. Denies any seizures or confusion. Denies any excess of NSAIDS and takes ASA 81 mg daily .  Assessment & Plan:    Hypertension BP 147/65 mmHg  Pulse 74 Controlled with home meds, following Continue home anti-hypertensive medications  Add Hydralazine Q6 hours as needed for BP 160/90    Hyperlipidemia Continue home statins Check lipid panel   History of Gastro Intestinal Bleed with anemia of Iron deficiency, without acute bleeding issues at this time Upper endoscopy in 2014 by [facility] showed 2 small varices with erosive gastritis and duodenitis with nonbleeding ulcers. H pilori negative. Most recent GI evaluation at the ED on 6/5 by Dr. Carlean Purl when patient presented with GIB, better controlled with PPIs and for future endoscopy as OP Continue PPI  GI consulted 6/22  Anemia of chronic disease, h/o GIB  and CKD Baseline Hgb is 10-11, current Hgb is 8, transfuse 2 u PRBCs 6/22 Will type and screen, and transfuse for Hgb less than 8 Continue po Iron while in hospital Repeat CBC in am  Prior History of ETOH, History of alcoholic cirrhosis and portal hypertension  INR 1.18. PLts normal at 236 Followed as OP by GI  No bleeding issues at this time  No Vit K is indicated at this time unless is recommended by Cards    Chronic kidney disease stage IIIB baseline creatinine 1.9   Recent Labs    Lab Results  Component Value Date   CREATININE 1.83* 04/29/2016   CREATININE 1.90* 04/13/2016   CREATININE 1.90* 04/13/2016    IVF Repeat CMET in am   DVT prophylaxis: SCDs due to h/o GIB, other plans as per Cards  Code Status: Full  Family Communication: Discussed with patient  Disposition Plan: Expect patient to be discharged to retirement home after condition improves Consults called: None Admission status: Inpatient tele      Consultants:  GI  Cardiology  Subjective: Pt says he's not having chest pain or pressure today.   Objective: Filed Vitals:   04/29/16 1100 04/29/16 1300 04/29/16 2142 04/30/16 0500  BP:  92/50 142/67 127/59  Pulse:  71 72 80  Temp:  97.6 F (36.4 C) 97.7 F (36.5 C) 97.5 F (36.4 C)  TempSrc:  Oral Oral Oral  Resp:  20 16   Weight: 144 lb 14.4 oz (65.726 kg)   148 lb 1.6 oz (67.178 kg)  SpO2:  99% 100% 94%    Intake/Output Summary (Last 24 hours) at 04/30/16 0944 Last data filed at 04/30/16  0800  Gross per 24 hour  Intake 1621.25 ml  Output    977 ml  Net 644.25 ml   Filed Weights   04/29/16 1100 04/30/16 0500  Weight: 144 lb 14.4 oz (65.726 kg) 148 lb 1.6 oz (67.178 kg)    Exam:  General exam: awake, alert, no distress, cooperative Respiratory system: Clear. No increased work of breathing. Cardiovascular system: S1 & S2 heard, RRR. No JVD, murmurs, gallops, clicks or pedal edema. Gastrointestinal  system: Abdomen is nondistended, soft and nontender. Normal bowel sounds heard. Central nervous system: Alert and oriented. No focal neurological deficits. Extremities: no cyanosis or clubbing.  Data Reviewed: Basic Metabolic Panel:  Recent Labs Lab 04/29/16 0656 04/30/16 0422  NA 138 134*  K 4.7 4.6  CL 107 107  CO2 21* 21*  GLUCOSE 112* 99  BUN 45* 34*  CREATININE 1.83* 1.48*  CALCIUM 9.2 8.3*   Liver Function Tests:  Recent Labs Lab 04/30/16 0422  AST 19  ALT 15*  ALKPHOS 63  BILITOT 0.6  PROT 5.5*  ALBUMIN 2.7*   No results for input(s): LIPASE, AMYLASE in the last 168 hours. No results for input(s): AMMONIA in the last 168 hours. CBC:  Recent Labs Lab 04/29/16 0656 04/30/16 0422  WBC 7.9 7.5  HGB 9.1* 8.2*  HCT 28.8* 25.6*  MCV 97.3 98.1  PLT 236 205   Cardiac Enzymes:  Recent Labs Lab 04/29/16 0656 04/29/16 0900 04/29/16 1128 04/29/16 1448  TROPONINI 0.34* 0.28* 0.28* 0.27*   BNP (last 3 results)  Recent Labs  07/22/15 1536  PROBNP 61.0   CBG: No results for input(s): GLUCAP in the last 168 hours.  No results found for this or any previous visit (from the past 240 hour(s)).   Studies: Dg Chest 2 View  04/29/2016  CLINICAL DATA:  Chest pain and shortness of breath for 2 days. EXAM: CHEST  2 VIEW COMPARISON:  Single-view of the chest 04/13/2016. PA and lateral chest 10/04/2015. FINDINGS: Elevation of the right hemidiaphragm relative to the left is unchanged. The lungs are clear. Heart size is normal. No pneumothorax or pleural effusion. IMPRESSION: No acute disease. Electronically Signed   By: Inge Rise M.D.   On: 04/29/2016 07:33     Scheduled Meds: . sodium chloride   Intravenous Once  . amLODipine  10 mg Oral Daily  . furosemide  40 mg Oral Daily  . isosorbide mononitrate  30 mg Oral Daily  . metoprolol succinate  100 mg Oral Daily  . pantoprazole  40 mg Oral Daily  . simvastatin  20 mg Oral QHS  . sodium chloride flush   3 mL Intravenous Q12H   Continuous Infusions: . sodium chloride 75 mL/hr at 04/30/16 0030    Principal Problem:   NSTEMI (non-ST elevated myocardial infarction) [facility]) Active Problems:   Hyperlipidemia   HYPERTENSION, BENIGN   Hypertrophic obstructive cardiomyopathy ([facility])   Edema   Alcoholic cirrhosis ([facility])   CKD (chronic kidney disease), stage III  Time spent:   Irwin Brakeman, MD, FAAFP [facility] Pager [phone]  If 7PM-7AM, please contact night-coverage [email or web address] Password [facility] 04/30/2016, 9:44 AM    LOS: 1 day

## 2016-05-01 ENCOUNTER — Encounter (HOSPITAL_COMMUNITY): Payer: Self-pay | Admitting: *Deleted

## 2016-05-01 ENCOUNTER — Inpatient Hospital Stay (HOSPITAL_COMMUNITY): Payer: Medicare Other

## 2016-05-01 ENCOUNTER — Inpatient Hospital Stay (HOSPITAL_COMMUNITY): Payer: Medicare Other | Admitting: Certified Registered"

## 2016-05-01 ENCOUNTER — Encounter (HOSPITAL_COMMUNITY)
Admission: EM | Disposition: A | Discharge status: Nursing Home (Includes ICF) | Payer: Self-pay | Source: Home / Self Care | Attending: Family Medicine

## 2016-05-01 DIAGNOSIS — R079 Chest pain, unspecified: Secondary | ICD-10-CM

## 2016-05-01 DIAGNOSIS — K2950 Unspecified chronic gastritis without bleeding: Secondary | ICD-10-CM

## 2016-05-01 DIAGNOSIS — K269 Duodenal ulcer, unspecified as acute or chronic, without hemorrhage or perforation: Secondary | ICD-10-CM | POA: Diagnosis present

## 2016-05-01 DIAGNOSIS — K921 Melena: Secondary | ICD-10-CM

## 2016-05-01 HISTORY — PX: ESOPHAGOGASTRODUODENOSCOPY (EGD) WITH PROPOFOL: SHX5813

## 2016-05-01 HISTORY — DX: Unspecified chronic gastritis without bleeding: K29.50

## 2016-05-01 LAB — TYPE AND SCREEN
ABO/RH(D): AB POS
ANTIBODY SCREEN: NEGATIVE
UNIT DIVISION: 0
Unit division: 0

## 2016-05-01 LAB — ECHOCARDIOGRAM COMPLETE
AO mean calculated velocity dopler: 168 cm/s
AV Area VTI index: 1.55 cm2/m2
AV Area VTI: 2.69 cm2
AV Mean grad: 14 mmHg
AV Peak grad: 31 mmHg
AV area mean vel ind: 1.39 cm2/m2
AV pk vel: 278 cm/s
AV vel: 2.78
AVAREAMEANV: 2.49 cm2
AVCELMEANRAT: 0.65
Ao pk vel: 0.71 m/s
CHL CUP AV PEAK INDEX: 1.5
CHL CUP AV VALUE AREA INDEX: 1.55
CHL CUP DOP CALC LVOT VTI: 38 cm
CHL CUP PV REG GRAD DIAS: 12 mmHg
E decel time: 164 msec
FS: 33 % (ref 28–44)
HEIGHTINCHES: 68 in
IV/PV OW: 1.08
LA diam end sys: 45 mm
LA vol A4C: 39 ml
LA vol index: 21.8 mL/m2
LA vol: 39 mL
LADIAMINDEX: 2.51 cm/m2
LASIZE: 45 mm
LVOT SV: 144 mL
LVOT area: 3.8 cm2
LVOT peak grad rest: 16 mmHg
LVOT peak vel: 197 cm/s
LVOTD: 22 mm
LVOTVTI: 0.73 cm
MV Dec: 164
MV pk E vel: 0.7 m/s
P 1/2 time: 230 ms
PV Reg vel dias: 176 cm/s
PW: 13 mm — AB (ref 0.6–1.1)
Reg peak vel: 316 cm/s
TAPSE: 18.9 mm
TRMAXVEL: 316 cm/s
VTI: 51.9 cm
Valve area: 2.78 cm2
WEIGHTICAEL: 2360 [oz_av]

## 2016-05-01 SURGERY — ESOPHAGOGASTRODUODENOSCOPY (EGD) WITH PROPOFOL
Anesthesia: Monitor Anesthesia Care

## 2016-05-01 MED ORDER — PROPOFOL 10 MG/ML IV BOLUS
INTRAVENOUS | Status: DC | PRN
  Administered 2016-05-01: 4 mg via INTRAVENOUS

## 2016-05-01 MED ORDER — PROPOFOL 500 MG/50ML IV EMUL
INTRAVENOUS | Status: DC | PRN
  Administered 2016-05-01: 75 ug/kg/min via INTRAVENOUS

## 2016-05-01 MED ORDER — LIDOCAINE HCL (CARDIAC) 20 MG/ML IV SOLN
INTRAVENOUS | Status: DC | PRN
  Administered 2016-05-01: 100 mg via INTRATRACHEAL

## 2016-05-01 NOTE — Progress Notes (Signed)
PT Cancellation Note  Patient Details Name: [name] MRN: [id number] DOB: 1929/09/11   Cancelled Treatment:    Reason Eval/Treat Not Completed: Patient at procedure or test/unavailable; patient down for endoscopy.  Will attempt later if available.   Reginia Naas 05/01/2016, 10:59 AM Magda Kiel, PT [phone] 05/01/2016

## 2016-05-01 NOTE — Progress Notes (Signed)
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 Had a similar episode yesterday which quickly subsided, therefore he did not seek medical attention. Pain notworsened with deep inspiration, movement or exertion. Denies worsening dyspnea or cough. Denies nausea, vomiting or diaphoresis. Since admission, he had no further episodes Denies any dizziness or falls. Denies any fever or chills.Denies any abdominal pain, appetite is normal andeats salt rich foods. Patient had an acute on chronic episode of black tarry stools 2-3 days ago, but he attributes it to Dexilant (Iron supply) Has chronic swelling But denies calf pain. Denies any headaches or vision changes. Denies any seizures or confusion. Denies any excess of NSAIDS and takes ASA 81 mg daily.   Assessment & Plan:    Hypertension BP 147/65 mmHg  Pulse 74 Controlled with home meds, following Continue home anti-hypertensive medications  Add Hydralazine Q6 hours as needed for BP 160/90    Hyperlipidemia Continue home statins lipid panel reviewed  History of Gastro Intestinal Bleed with anemia of Iron deficiency, without acute bleeding issues at this time Upper endoscopy in 2014 by [facility] showed 2 small varices with erosive gastritis and duodenitis with nonbleeding ulcers. H pilori negative. Most recent GI evaluation at the ED on 6/5 by Dr. Carlean Purl when patient presented with GIB, better controlled with PPIs and for future endoscopy as OP Continue PPI therapy, avoid NSAIDs GI consulted 6/22 - for EGD 6/23,  pt declined colonoscopy  Anemia of chronic disease, h/o GIB and CKD Baseline Hgb is 10-11, current Hgb is 8, transfuse 2 u PRBCs 6/22, Hg 11.0 6/23 Continue po Iron while in hospital Following CBC  Prior History of ETOH, History of alcoholic cirrhosis and portal hypertension  INR 1.18. PLts normal. Followed as OP by GI  No bleeding issues at this time  No Vit K is indicated at this time unless is recommended by Cards    Chronic kidney disease stage IIIB baseline creatinine 1.9   Recent Labs    Lab Results  Component Value Date   CREATININE 1.83* 04/29/2016   CREATININE 1.90* 04/13/2016   CREATININE 1.90* 04/13/2016    IVF Repeat CMET in am   DVT prophylaxis: SCDs due to h/o GIB, other plans as per Cards  Code Status: Full  Family Communication: Discussed with patient  Disposition Plan: Expect patient to be discharged to retirement home after condition improves Consults called: None Admission status: Inpatient tele     Consultants:  GI  Cardiology  Subjective: Pt without complaints, no chest pain, for EGD today   Objective: Filed Vitals:   04/30/16 1603 04/30/16 1630 04/30/16 1915 05/01/16 0400  BP: 123/58 125/62 126/62 141/70  Pulse:   83 88  Temp: 98.1 F (36.7 C) 98.1 F (36.7 C) 97.8 F (36.6 C) 95.5 F (35.3 C)  TempSrc: Oral Oral Oral Oral  Resp: 15 14 16 16   Height:      Weight:    147 lb 8 oz (66.906 kg)  SpO2: 97% 96% 97% 95%    Intake/Output Summary (Last 24 hours) at 05/01/16 0854 Last data filed at 05/01/16 0400  Gross per 24 hour  Intake 1301.33 ml  Output    826 ml  Net 475.33 ml   Filed Weights   04/29/16 1100 04/30/16 0500 05/01/16 0400  Weight: 144 lb 14.4 oz (65.726 kg) 148 lb 1.6 oz (67.178 kg) 147 lb 8 oz (66.906 kg)    Exam:  General exam: awake, alert, no distress, cooperative Respiratory system: Clear. No increased work of breathing. Cardiovascular system: S1 & S2 heard, RRR. No JVD,  murmurs, gallops, clicks or pedal edema. Gastrointestinal system: Abdomen is nondistended, soft and nontender. Normal bowel sounds heard. Central nervous system: Alert and oriented. No focal neurological deficits. Extremities: no cyanosis or clubbing.  Data Reviewed: Basic Metabolic Panel:  Recent Labs Lab 04/29/16 0656 04/30/16 0422  NA 138 134*  K 4.7 4.6  CL 107 107  CO2 21* 21*  GLUCOSE 112* 99  BUN 45* 34*  CREATININE 1.83* 1.48*  CALCIUM 9.2 8.3*   Liver Function Tests:  Recent Labs Lab 04/30/16 0422  AST 19  ALT 15*  ALKPHOS 63  BILITOT 0.6  PROT 5.5*  ALBUMIN 2.7*   No results for input(s): LIPASE, AMYLASE in the last 168 hours. No results for input(s): AMMONIA in the last 168 hours. CBC:  Recent Labs Lab 04/29/16 0656 04/30/16 0422 04/30/16 2108  WBC 7.9 7.5  --   HGB 9.1* 8.2* 11.0*  HCT 28.8* 25.6* 33.5*  MCV 97.3 98.1  --   PLT 236 205  --    Cardiac Enzymes:  Recent Labs Lab 04/29/16 0656 04/29/16 0900 04/29/16 1128 04/29/16 1448  TROPONINI 0.34* 0.28* 0.28* 0.27*   BNP (last 3 results)  Recent Labs  07/22/15 1536  PROBNP 61.0   CBG: No results for input(s): GLUCAP in the last 168 hours.  No results found for this or any previous visit (from the past 240 hour(s)).   Studies: No results found.   Scheduled Meds: . sodium chloride   Intravenous Once  . amLODipine  10 mg Oral Daily  . furosemide  40 mg Oral Daily  . isosorbide mononitrate  30 mg Oral Daily  . metoprolol succinate  100 mg Oral Daily  . pantoprazole  40 mg Oral BID AC  . simvastatin  20 mg Oral QHS  . sodium chloride flush  3 mL Intravenous Q12H   Continuous Infusions: . sodium chloride Stopped (04/30/16 1956)  . sodium chloride 20 mL/hr at 05/01/16 0400    Principal Problem:   NSTEMI (non-ST elevated myocardial infarction) [facility]) Active Problems:   Hyperlipidemia   HYPERTENSION, BENIGN   Hypertrophic obstructive cardiomyopathy ([facility])   Edema    Alcoholic cirrhosis ([facility])   CKD (chronic kidney disease), stage III  Time spent:   Irwin Brakeman, MD, FAAFP [facility] Pager [phone]  If 7PM-7AM, please contact night-coverage [email or web address] Password [facility] 05/01/2016, 8:54 AM    LOS: 2 days

## 2016-05-01 NOTE — Progress Notes (Signed)
Physical Therapy Treatment Patient Details Name: [name] MRN: [id number] DOB: [birth year] Today's Date: 05-06-16    History of Present Illness [name] is a 82 y.o. male with a Past Medical History of HTN, Hypertrophic cardiomyopathy, HLD, ETOH cirrhosis, GERD, PUD, diverticulosis, CKD who presents with CP found to have elevated troponin positive for NSTEMI.    PT Comments    Patient progressing with activity tolerance and able to participate in LE strength exercises.  Will need HHPT at d/c at [facility].  Follow Up Recommendations  Home health PT     Equipment Recommendations  None recommended by PT    Recommendations for Other Services       Precautions / Restrictions Precautions Precautions: Fall    Mobility  Bed Mobility         Supine to sit: Supervision;HOB elevated     General bed mobility comments: for IV, safety  Transfers Overall transfer level: Needs assistance Equipment used: Straight cane Transfers: Sit to/from Stand Sit to Stand: Min guard         General transfer comment: for balance  Ambulation/Gait Ambulation/Gait assistance: Min guard Ambulation Distance (Feet): 150 Feet Assistive device: Straight cane Gait Pattern/deviations: Step-through pattern;Step-to pattern;Decreased stride length     General Gait Details: patient self motivated to increase stride length in hallway; assist for balance   Stairs            Wheelchair Mobility    Modified Rankin (Stroke Patients Only)       Balance                                    Cognition Arousal/Alertness: Awake/alert Behavior During Therapy: WFL for tasks assessed/performed Overall Cognitive Status: No family/caregiver present to determine baseline cognitive functioning       Memory: Decreased short-term memory              Exercises General Exercises - Lower Extremity Hip ABduction/ADduction: Strengthening;Both;5 reps;Standing Hip  Flexion/Marching: Strengthening;5 reps;Both;Standing Toe Raises: Strengthening;Both;5 reps;Standing Heel Raises: Strengthening;Both;5 reps;Standing Other Exercises Other Exercises: sit to stand x 5 with UE support from bed    General Comments        Pertinent Vitals/Pain Pain Assessment: No/denies pain    Home Living                      Prior Function            PT Goals (current goals can now be found in the care plan section) Progress towards PT goals: Progressing toward goals    Frequency  Min 3X/week    PT Plan Current plan remains appropriate    Co-evaluation             End of Session Equipment Utilized During Treatment: Gait belt Activity Tolerance: Patient tolerated treatment well Patient left: in bed;with call bell/phone within reach     Time: 1343-1400 PT Time Calculation (min) (ACUTE ONLY): 17 min  Charges:  $Gait Training: 8-22 mins                    G Codes:      Reginia Naas 2016/05/06, 2:30 PM  Magda Kiel, [place] 05-06-2016

## 2016-05-01 NOTE — Anesthesia Preprocedure Evaluation (Addendum)
Anesthesia Evaluation  Patient identified by MRN, date of birth, ID band Patient awake    Reviewed: Allergy & Precautions, H&P , NPO status , Patient's Chart, lab work & pertinent test results, reviewed documented beta blocker date and time   Airway Mallampati: II  TM Distance: >3 FB Neck ROM: Full    Dental no notable dental hx. (+) Teeth Intact, Dental Advisory Given   Pulmonary neg pulmonary ROS,    Pulmonary exam normal breath sounds clear to auscultation       Cardiovascular hypertension, Pt. on medications and Pt. on home beta blockers  Rhythm:Regular Rate:Normal + Systolic murmurs Hypertrophic cardiomyopathy   Neuro/Psych negative neurological ROS  negative psych ROS   GI/Hepatic GERD  ,(+)     substance abuse  alcohol use,   Endo/Other  negative endocrine ROS  Renal/GU Renal InsufficiencyRenal disease  negative genitourinary   Musculoskeletal   Abdominal   Peds  Hematology negative hematology ROS (+) anemia ,   Anesthesia Other Findings   Reproductive/Obstetrics negative OB ROS                           Anesthesia Physical Anesthesia Plan  ASA: III  Anesthesia Plan: MAC   Post-op Pain Management:    Induction: Intravenous  Airway Management Planned: Nasal Cannula  Additional Equipment:   Intra-op Plan:   Post-operative Plan:   Informed Consent: I have reviewed the patients History and Physical, chart, labs and discussed the procedure including the risks, benefits and alternatives for the proposed anesthesia with the patient or authorized representative who has indicated his/her understanding and acceptance.   Dental advisory given  Plan Discussed with: CRNA  Anesthesia Plan Comments:         Anesthesia Quick Evaluation

## 2016-05-01 NOTE — Progress Notes (Signed)
TELEMETRY: Reviewed telemetry pt in NSR with some sinus pauses.  Filed Vitals:   04/30/16 1603 04/30/16 1630 04/30/16 1915 05/01/16 0400  BP: 123/58 125/62 126/62 141/70  Pulse:   83 88  Temp: 98.1 F (36.7 C) 98.1 F (36.7 C) 97.8 F (36.6 C) 95.5 F (35.3 C)  TempSrc: Oral Oral Oral Oral  Resp: 15 14 16 16   Height:      Weight:    147 lb 8 oz (66.906 kg)  SpO2: 97% 96% 97% 95%    Intake/Output Summary (Last 24 hours) at 05/01/16 0955 Last data filed at 05/01/16 0900  Gross per 24 hour  Intake 1301.33 ml  Output    826 ml  Net 475.33 ml   Filed Weights   04/29/16 1100 04/30/16 0500 05/01/16 0400  Weight: 144 lb 14.4 oz (65.726 kg) 148 lb 1.6 oz (67.178 kg) 147 lb 8 oz (66.906 kg)    Subjective No further chest pain or SOB. Awaiting upper endoscopy.   . sodium chloride   Intravenous Once  . amLODipine  10 mg Oral Daily  . furosemide  40 mg Oral Daily  . isosorbide mononitrate  30 mg Oral Daily  . metoprolol succinate  100 mg Oral Daily  . pantoprazole  40 mg Oral BID AC  . simvastatin  20 mg Oral QHS  . sodium chloride flush  3 mL Intravenous Q12H   . sodium chloride Stopped (04/30/16 1956)  . sodium chloride 20 mL/hr at 05/01/16 0400    LABS: Basic Metabolic Panel:  Recent Labs  04/29/16 0656 04/30/16 0422  NA 138 134*  K 4.7 4.6  CL 107 107  CO2 21* 21*  GLUCOSE 112* 99  BUN 45* 34*  CREATININE 1.83* 1.48*  CALCIUM 9.2 8.3*   Liver Function Tests:  Recent Labs  04/30/16 0422  AST 19  ALT 15*  ALKPHOS 63  BILITOT 0.6  PROT 5.5*  ALBUMIN 2.7*   No results for input(s): LIPASE, AMYLASE in the last 72 hours. CBC:  Recent Labs  04/29/16 0656 04/30/16 0422 04/30/16 2108  WBC 7.9 7.5  --   HGB 9.1* 8.2* 11.0*  HCT 28.8* 25.6* 33.5*  MCV 97.3 98.1  --   PLT 236 205  --    Cardiac Enzymes:  Recent Labs  04/29/16 0900 04/29/16 1128 04/29/16 1448  TROPONINI 0.28* 0.28* 0.27*    Radiology/Studies:  No results found. Ecg not  ordered today.  PHYSICAL EXAM General: Well developed, well nourished, male in no acute distress Head: Eyes PERRLA, No xanthomas. Normocephalic and atraumatic, oropharynx without edema or exudate.  Lungs: Resp regular and unlabored, CTA. Heart: RRR no s3, s4 . Harsh 2/6 systolic murmur Neck: No carotid bruits. No lymphadenopathy. No JVD. Abdomen: Bowel sounds present, abdomen soft and non-tender without masses or hernias noted. Msk: No spine or cva tenderness. No weakness, no joint deformities or effusions. Extremities: No clubbing, cyanosis. 1-2 + BL LE edema with skin changes/ hyperkeratosis.  DP/PT/Radials 2+ and equal bilaterally. Neuro: Alert and oriented X 3. No focal deficits noted. Psych: Good affect, responds appropriately Skin: No rashes or lesions noted.   ASSESSMENT AND PLAN:  [name] is a 82 y.o. male with a history of hypertrophic cardiomyopathy, HTN, HLD, RBBB, chronic lower extremity edema, alcohol abuse with previous ascites, portal gastropathy, coagulopathy and mental status issues who presented to [facility] ED for evaluation of chest pain.  1. Chest pain with elevated troponin. Troponin trend is flat  suggesting this may be more demand ischemia in setting of severe anemia. No recurrent chest pain. Echo is still in process. Repeat ECG with RBBB and stable from previous. He is on triple antianginal therapy with beta blocker, nitrates, and amlodipine. Dr. Martinique was not inclined to pursue further ischemic work up yet given declining Hgb and recent GI bleed. Stress test is not advised in setting of severe anemia. Cardiac cath is risky as well with anemia, CKD, etc. He would not be a candidate for anticoagulation or DAPT at this time with recent bleeding in the event a stent was needed. Will continue optimal medical therapy at this time and monitor for recurrent symptoms. Can consider ischemic work up at a later time depending on stability of other issues. 2.  GI bleed. Multiple potential causes with portal gastropathy, history of duodenal ulcers, duodenitis, and colon polyps. GI consulted who are planning colonoscopy and upper endoscopy. Patient refusing colonoscopy but willing to proceed with EGD. This will be done today.  3. HCM. I suspect murmur related to this and exacerbated by anemia. Echo is pending. 4. Anemia. S/p 2 U PRBC. Now Hg 8.2--> 11.0 5. CKD stage 3. Creatinine improved to 1.43. 6. HTN controlled 7. Chronic LE edema.  8. Memory deficit. ? Dementia.   Present on Admission:  . NSTEMI (non-ST elevated myocardial infarction) ([place]) . Hyperlipidemia . HYPERTENSION, BENIGN . Hypertrophic obstructive cardiomyopathy ([place]) . Alcoholic cirrhosis ([place]) . Edema . CKD (chronic kidney disease), stage III  Signed, Angelena Form, PA-C 05/01/2016 9:55 AM   Patient seen and examined and history reviewed. Agree with above findings and plan. Feeling well post EGD. No chest pain since admission. VSS. Lungs clear. EGD results reviewed. Gastritis and small duodenal ulcers. No active bleeding. Patient refused colonoscopy. Hgb improved post transfusion to 11. Patient will be off ASA and NSAIDs for at least one month. No further cardiac work up at this time. Will need follow up with Dr. Angelena Form as an outpatient. Will defer further ischemic work up pending stability of bleeding.   Shayne Deerman [place], [place] 05/01/2016 12:38 PM

## 2016-05-01 NOTE — Anesthesia Postprocedure Evaluation (Signed)
Anesthesia Post Note  Patient: [name]  Procedure(s) Performed: Procedure(s) (LRB): ESOPHAGOGASTRODUODENOSCOPY (EGD) WITH PROPOFOL (N/A)  Patient location during evaluation: PACU Anesthesia Type: MAC Level of consciousness: awake and alert Pain management: pain level controlled Vital Signs Assessment: post-procedure vital signs reviewed and stable Respiratory status: spontaneous breathing, nonlabored ventilation, respiratory function stable and patient connected to nasal cannula oxygen Cardiovascular status: stable and blood pressure returned to baseline Anesthetic complications: no    Last Vitals:  Filed Vitals:   05/01/16 1205 05/01/16 1215  BP: 141/67 167/74  Pulse: 78 74  Temp:    Resp: 19 15    Last Pain:  Filed Vitals:   05/01/16 1216  PainSc: 0-No pain                 Kendall Justo,W. EDMOND

## 2016-05-01 NOTE — Transfer of Care (Signed)
Immediate Anesthesia Transfer of Care Note  Patient: [name]  Procedure(s) Performed: Procedure(s): ESOPHAGOGASTRODUODENOSCOPY (EGD) WITH PROPOFOL (N/A)  Patient Location: Endoscopy Unit  Anesthesia Type:MAC  Level of Consciousness: awake, alert , oriented and patient cooperative  Airway & Oxygen Therapy: Patient Spontanous Breathing  Post-op Assessment: Report given to RN, Post -op Vital signs reviewed and stable and Patient moving all extremities X 4  Post vital signs: Reviewed and stable  Last Vitals:  Filed Vitals:   05/01/16 1051 05/01/16 1155  BP: 172/70 128/64  Pulse:  84  Temp: 36.6 C   Resp: 18 22    Last Pain:  Filed Vitals:   05/01/16 1157  PainSc: 0-No pain      Patients Stated Pain Goal: 0 ([id number])  Complications: No apparent anesthesia complications

## 2016-05-01 NOTE — Care Management Important Message (Signed)
Important Message  Patient Details  Name: [name] MRN: [id number] Date of Birth: 12-08-1928   Medicare Important Message Given:  Yes    Hanson Medeiros Abena 05/01/2016, 11:24 AM

## 2016-05-01 NOTE — Anesthesia Procedure Notes (Signed)
Procedure Name: MAC Date/Time: 05/01/2016 11:29 AM Performed by: Lance Coon Pre-anesthesia Checklist: Patient identified, Timeout performed, Emergency Drugs available, Suction available and Patient being monitored Patient Re-evaluated:Patient Re-evaluated prior to inductionOxygen Delivery Method: Nasal cannula Preoxygenation: Pre-oxygenation with 100% oxygen Intubation Type: IV induction

## 2016-05-01 NOTE — Progress Notes (Signed)
  Echocardiogram 2D Echocardiogram has been performed.  Jennette Dubin 05/01/2016, 9:18 AM

## 2016-05-01 NOTE — Care Management Note (Signed)
Case Management Note  Patient Details  Name: [name] MRN: [id number] Date of Birth: [birth year]  Subjective/Objective:   Pt in for chest pain. Pt is from [place] with wife. Plan will be to return once stable.                  Action/Plan: PT recommendations for [facility]. Pt wants services via [facility]. CM did fax orders over to [employer] for [employer] and [facility] to set up [facility]. No further needs from CM at this time.    Expected Discharge Date:  05/02/16               Expected Discharge Plan:  Assisted Living / Rest Home  In-House Referral:  Clinical Social Work  Discharge planning Services  CM Consult  Post Acute Care Choice:  Home Health Choice offered to:  Patient  DME Arranged:  N/A DME Agency:  NA  HH Arranged:  PT [place] Agency:  Other - See comment ([facility] to set up- Order faxed to [facility]. [facility])  Status of Service:  Completed, signed off  If discussed at [place] of Stay Meetings, dates discussed:    Additional Comments:  Bethena Roys, RN 05/01/2016, 4:37 PM

## 2016-05-01 NOTE — Clinical Social Work Note (Signed)
Clinical Social Work Assessment  Patient Details  Name: [name] MRN: [id number] Date of Birth: 1929/02/13  Date of referral:  05/01/16               Reason for consult:  Facility Placement, Discharge Planning                Permission sought to share information with:  Facility Sport and exercise psychologist, Family Supports Permission granted to share information::  Yes, Verbal Permission Granted  Name::     Manufacturing engineer::  [facility]  Relationship::  Spouse  Contact Information:  [phone]  Housing/Transportation Living arrangements for the past 2 months:  [place] of Information:  Patient, Medical Team Patient Interpreter Needed:  None Criminal Activity/Legal Involvement Pertinent to Current Situation/Hospitalization:  No - Comment as needed Significant Relationships:  Spouse Lives with:  Spouse Do you feel safe going back to the place where you live?  Yes Need for family participation in patient care:  Yes (Comment)  Care giving concerns:  Patient is from [facility].   Social Worker assessment / plan:  CSW met with patient. No supports at bedside. CSW introduced role and explained that discharge planning would be discussed. CSW confirmed that patient is from [facility] and plans to return when he discharges. His wife will transport. Patient has been living there for about 6 months. No further concerns. CSW encouraged patient to contact CSW as needed. CSW will continue to follow patient and facilitate discharge back to [facility] once medically stable.  Employment status:  Retired Nurse, adult PT Recommendations:  Home with [place] / Referral to community resources:  Other (Comment Required) (None. Patient plans to return to [facility].)  Patient/Family's Response to care:  Patient agreeable to return to [facility]. Patient's wife reportedly supportive and involved in patient's  care. Patient appreciated social work intervention.  Patient/Family's Understanding of and Emotional Response to Diagnosis, Current Treatment, and Prognosis:  Patient understands need for HHPT while at ALF. Patient pleasant and agreeable.  Emotional Assessment Appearance:  Appears stated age Attitude/Demeanor/Rapport:  Other (Pleasant) Affect (typically observed):  Accepting, Appropriate, Calm, Pleasant Orientation:  Oriented to Self, Oriented to Place, Oriented to  Time, Oriented to Situation Alcohol / Substance use:  Alcohol Use Psych involvement (Current and /or in the community):  No (Comment)  Discharge Needs  Concerns to be addressed:  Care Coordination Readmission within the last 30 days:  No Current discharge risk:  Dependent with Mobility Barriers to Discharge:  No Barriers Identified   Candie Chroman, LCSW 05/01/2016, 3:20 PM

## 2016-05-01 NOTE — Op Note (Signed)
[facility] Patient Name: [name] Procedure Date : 05/01/2016 MRN: [id number] Attending MD: Jerene Bears , MD Date of Birth: 1929/03/11 CSN: [id number] Age: 82 Admit Type: Inpatient Procedure:                Upper GI endoscopy Indications:              Iron deficiency anemia secondary to chronic blood                            loss, Heme positive stool, Melena Providers:                Lajuan Lines. Hilarie Fredrickson, MD, Cleda Daub, RN, Elspeth Cho, Technician, Lance Coon, CRNA Referring MD:              Medicines:                Monitored Anesthesia Care Complications:            No immediate complications. Estimated Blood Loss:     Estimated blood loss: none. Estimated blood loss                            was minimal. Procedure:                Pre-Anesthesia Assessment:                           - Prior to the procedure, a History and Physical                            was performed, and patient medications and                            allergies were reviewed. The patient's tolerance of                            previous anesthesia was also reviewed. The risks                            and benefits of the procedure and the sedation                            options and risks were discussed with the patient.                            All questions were answered, and informed consent                            was obtained. Prior Anticoagulants: The patient has                            taken no previous anticoagulant or antiplatelet  agents. ASA Grade Assessment: III - A patient with                            severe systemic disease. After reviewing the risks                            and benefits, the patient was deemed in                            satisfactory condition to undergo the procedure.                           After obtaining informed consent, the endoscope was   passed under direct vision. Throughout the                            procedure, the patient's blood pressure, pulse, and                            oxygen saturations were monitored continuously. The                            [id number]) scope was introduced through the                            mouth, and advanced to the second part of duodenum.                            The upper GI endoscopy was accomplished without                            difficulty. The patient tolerated the procedure                            well. Scope In: Scope Out: Findings:      The examined esophagus was normal.      A small hiatal hernia was present.      There is no endoscopic evidence of varices in the entire esophagus.      Scattered mild inflammation characterized by erosions and erythema was       found in the gastric antrum. Biopsies were taken with a cold forceps for       histology.      The cardia and gastric fundus were normal on retroflexion.      Two non-bleeding cratered duodenal ulcers with no stigmata of bleeding       were found in the early second portion of the duodenum. The largest       lesion was 10 mm in largest dimension, the second was 3-4 mm). Impression:               - Normal esophagus.                           - Small hiatal hernia.                           -  Gastritis. Biopsied.                           - Multiple non-bleeding duodenal ulcers with no                            stigmata of bleeding. Moderate Sedation:      N/A Recommendation:           - Return patient to hospital ward for ongoing care.                           - Resume previous diet.                           - No aspirin, ibuprofen, naproxen, or other                            non-steroidal anti-inflammatory drugs.                           - Await pathology results.                           - Use a proton pump inhibitor PO BID for 1 month,                            then once daily  indefinitely. Procedure Code(s):        --- Professional ---                           [phone], Esophagogastroduodenoscopy, flexible,                            transoral; with biopsy, single or multiple Diagnosis Code(s):        --- Professional ---                           K44.9, Diaphragmatic hernia without obstruction or                            gangrene                           K29.70, Gastritis, unspecified, without bleeding                           K26.9, Duodenal ulcer, unspecified as acute or                            chronic, without hemorrhage or perforation                           D50.0, Iron deficiency anemia secondary to blood                            loss (chronic)  R19.5, Other fecal abnormalities                           K92.1, Melena (includes Hematochezia) CPT copyright 2016 American Medical Association. All rights reserved. The codes documented in this report are preliminary and upon coder review may  be revised to meet current compliance requirements. Jerene Bears, MD 05/01/2016 12:19:47 PM This report has been signed electronically. Number of Addenda: 0

## 2016-05-01 NOTE — Clinical Social Work Note (Signed)
Per admissions coordinator at [facility], [facility], patient is independent living. RNCM aware and will fax over PT order to Ms. Williams.  CSW signing off.  Dayton Scrape, [place]

## 2016-05-02 DIAGNOSIS — R778 Other specified abnormalities of plasma proteins: Secondary | ICD-10-CM | POA: Diagnosis present

## 2016-05-02 DIAGNOSIS — I248 Other forms of acute ischemic heart disease: Secondary | ICD-10-CM

## 2016-05-02 DIAGNOSIS — K2970 Gastritis, unspecified, without bleeding: Secondary | ICD-10-CM | POA: Diagnosis present

## 2016-05-02 DIAGNOSIS — D62 Acute posthemorrhagic anemia: Secondary | ICD-10-CM

## 2016-05-02 DIAGNOSIS — R079 Chest pain, unspecified: Secondary | ICD-10-CM | POA: Diagnosis present

## 2016-05-02 DIAGNOSIS — R7989 Other specified abnormal findings of blood chemistry: Secondary | ICD-10-CM

## 2016-05-02 MED ORDER — SPIRONOLACTONE 25 MG PO TABS: 12.5000 mg | ORAL_TABLET | ORAL | Status: DC

## 2016-05-02 MED ORDER — PANTOPRAZOLE SODIUM 40 MG PO TBEC: 40.0000 mg | DELAYED_RELEASE_TABLET | Freq: Two times a day (BID) | ORAL | Status: DC

## 2016-05-02 MED ORDER — ISOSORBIDE MONONITRATE ER 30 MG PO TB24: 30.0000 mg | ORAL_TABLET | Freq: Every day | ORAL | Status: DC

## 2016-05-02 NOTE — Discharge Instructions (Signed)
Anemia, Nonspecific °Anemia is a condition in which the concentration of red blood cells or hemoglobin in the blood is below normal. Hemoglobin is a substance in red blood cells that carries oxygen to the tissues of the body. Anemia results in not enough oxygen reaching these tissues.  °CAUSES  °Common causes of anemia include:  °· Excessive bleeding. Bleeding may be internal or external. This includes excessive bleeding from periods (in women) or from the intestine.   °· Poor nutrition.   °· Chronic kidney, thyroid, and liver disease.  °· Bone marrow disorders that decrease red blood cell production. °· Cancer and treatments for cancer. °· HIV, AIDS, and their treatments. °· Spleen problems that increase red blood cell destruction. °· Blood disorders. °· Excess destruction of red blood cells due to infection, medicines, and autoimmune disorders. °SIGNS AND SYMPTOMS  °· Minor weakness.   °· Dizziness.   °· Headache. °· Palpitations.   °· Shortness of breath, especially with exercise.   °· Paleness. °· Cold sensitivity. °· Indigestion. °· Nausea. °· Difficulty sleeping. °· Difficulty concentrating. °Symptoms may occur suddenly or they may develop slowly.  °DIAGNOSIS  °Additional blood tests are often needed. These help your health care provider determine the best treatment. Your health care provider will check your stool for blood and look for other causes of blood loss.  °TREATMENT  °Treatment varies depending on the cause of the anemia. Treatment can include:  °· Supplements of iron, vitamin B12, or folic acid.   °· Hormone medicines.   °· A blood transfusion. This may be needed if blood loss is severe.   °· Hospitalization. This may be needed if there is significant continual blood loss.   °· Dietary changes. °· Spleen removal. °HOME CARE INSTRUCTIONS °Keep all follow-up appointments. It often takes many weeks to correct anemia, and having your health care provider check on your condition and your response to  treatment is very important. °SEEK IMMEDIATE MEDICAL CARE IF:  °· You develop extreme weakness, shortness of breath, or chest pain.   °· You become dizzy or have trouble concentrating. °· You develop heavy vaginal bleeding.   °· You develop a rash.   °· You have bloody or black, tarry stools.   °· You faint.   °· You vomit up blood.   °· You vomit repeatedly.   °· You have abdominal pain. °· You have a fever or persistent symptoms for more than 2-3 days.   °· You have a fever and your symptoms suddenly get worse.   °· You are dehydrated.   °MAKE SURE YOU: °· Understand these instructions. °· Will watch your condition. °· Will get help right away if you are not doing well or get worse. °  °This information is not intended to replace advice given to you by your health care provider. Make sure you discuss any questions you have with your health care provider. °  °Document Released: 12/03/2004 Document Revised: 06/28/2013 Document Reviewed: 04/21/2013 °Elsevier Interactive Patient Education ©2016 Else[employer] ° °Blood Transfusion  °A blood transfusion is a procedure in which you receive donated blood through an IV tube. You may need a blood transfusion because of illness, surgery, or injury. The blood may come from a donor, or it may be your own blood that you donated previously. °The blood given in a transfusion is made up of different types of cells. You may receive: °· Red blood cells. These carry oxygen and replace lost blood. °· Platelets. These control bleeding. °· Plasma. This helps blood to clot. °If you have hemophilia or another clotting disorder, you may also receive other types of   blood products. °LET YOUR HEALTH CARE PROVIDER KNOW ABOUT: °· Any allergies you have. °· All medicines you are taking, including vitamins, herbs, eye drops, creams, and over-the-counter medicines. °· Previous problems you or members of your family have had with the use of anesthetics. °· Any blood disorders you have. °· Previous  surgeries you have had. °· Any medical conditions you may have. °· Any previous reactions you have had during a blood transfusion.   °RISKS AND COMPLICATIONS °Generally, this is a safe procedure. However, problems may occur, including: °· Having an allergic reaction to something in the donated blood. °· Fever. This may be a reaction to the white blood cells in the transfused blood. °· Iron overload. This can happen from having many transfusions. °· Transfusion-related acute lung injury (TRALI). This is a rare reaction that causes lung damage. The cause is not known. TRALI can occur within hours of a transfusion or several days later. °· Sudden (acute) or delayed hemolytic reactions. This happens if your blood does not match the cells in your transfusion. Your body's defense system (immune system) may try to attack the new cells. This complication is rare. °· Infection. This is rare. °BEFORE THE PROCEDURE °· You may have a blood test to determine your blood type. This is necessary to know what kind of blood your body will accept. °· If you are going to have a planned surgery, you may donate your own blood. This may be done in case you need to have a transfusion. °· If you have had an allergic reaction to a transfusion in the past, you may be given medicine to help prevent a reaction. Take this medicine only as directed by your health care provider. °· You will have your temperature, blood pressure, and pulse monitored before the transfusion. °PROCEDURE  °· An IV will be started in your hand or arm. °· The bag of donated blood will be attached to your IV tube and given into your vein. °· Your temperature, blood pressure, and pulse will be monitored regularly during the transfusion. This monitoring is done to detect early signs of a transfusion reaction. °· If you have any signs or symptoms of a reaction, your transfusion will be stopped and you may be given medicine. °· When the transfusion is over, your IV will be  removed. °· Pressure may be applied to the IV site for a few minutes. °· A bandage (dressing) will be applied. °The procedure may vary among health care providers and hospitals. °AFTER THE PROCEDURE °· Your blood pressure, temperature, and pulse will be monitored regularly. °  °This information is not intended to replace advice given to you by your health care provider. Make sure you discuss any questions you have with your health care provider. °  °Document Released: 10/23/2000 Document Revised: 11/16/2014 Document Reviewed: 09/05/2014 °Elsevier Interactive Patient Education ©2016 Elsevier Inc. ° °

## 2016-05-02 NOTE — Discharge Summary (Signed)
Physician Discharge Summary  [name] [id number] DOB: 05/06/1929 DOA: 04/29/2016  PCP: [name], MD  Admit date: 04/29/2016 Discharge date: 05/02/2016  Recommendations for Outpatient Follow-up:  1. Follow up with PCP in 1 weeks 2. Please follow up with cardiologist in 1 week 3. 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 Had a similar episode yesterday which quickly subsided, therefore he did not seek medical attention. Pain notworsened with deep inspiration, movement or exertion. Denies worsening dyspnea or cough. Denies nausea, vomiting or diaphoresis. Since admission, he had no further episodes Denies any dizziness or falls. Denies any fever or chills.Denies any abdominal pain, appetite is normal andeats salt rich foods. Patient had an acute on chronic episode of black tarry stools 2-3 days ago, but he attributes it to Dexilant (Iron supply) Has chronic swelling But denies calf pain. Denies any headaches or vision changes. Denies any seizures or confusion. Denies any excess of NSAIDS and takes ASA 81 mg daily.   Assessment & Plan:   1. Chest pain with elevated troponin. Troponin trend is flat suggesting this may be more demand ischemia in setting of severe anemia. No recurrent chest pain. Repeat ECG with RBBB and stable from previous. He is on triple antianginal therapy with beta blocker, nitrates, and amlodipine.Echo with normal EF and no RWMA;s See note Dr Martinique unable  to have DAT at this time due to GI bleeding som no cath or stress testing  2. GI bleed. Multiple potential causes with portal gastropathy, history of duodenal ulcers, duodenitis, and colon polyps.  Patient refusing colonoscopy but willing to proceed with EGD. EGD showed gastritis and duodenal ulcers.  Pt received 2 U PRBC.  Pt to be on BID PPI for 1 month then 1 ppi daily indefinately per GI.  No NSAIDS, no aspirin.  3. HCM.no change in murmur echo with mild AS and moderate AR  4. Anemia. S/p 2 U PRBC. Now Hg 8.2--> 11.0 after transfusion 5. CKD stage 3. - remained stable, follow up with primary care provider       6. HTN - stable, started on imdur per cardiology and tolerated.  7. Chronic LE edema.  8. Memory deficit. ? Dementia.      Hyperlipidemia Continue home statins lipid panel reviewed  History of Gastro Intestinal Bleed with anemia of Iron deficiency, without acute bleeding issues at this time Upper endoscopy in 2014 by [facility] showed 2 small varices with erosive gastritis and duodenitis with nonbleeding ulcers. H pilori negative. Most recent GI evaluation at the ED on 6/5 by Dr. Carlean Purl when patient presented with GIB, better controlled with PPIs and for future endoscopy as OP Continue PPI therapy, avoid NSAIDs, no aspirin per GI GI consulted 6/22 - for EGD 6/23, pt declined colonoscopy - see EGD report below.   Anemia of chronic disease, h/o GIB and CKD Baseline Hgb is 10-11, current Hgb is 8, transfuse 2 u PRBCs 6/22, Hg 11.0 6/23 Continue po Iron while in hospital Following CBC  Prior History of ETOH, History of alcoholic cirrhosis and portal  hypertension  INR 1.18. PLts normal. Followed as OP by GI  No bleeding issues at this time  No Vit K is indicated     Chronic kidney disease stage IIIB baseline creatinine 1.9          Discharge Diagnoses:  Principal Problem:   NSTEMI (non-ST elevated myocardial infarction) ([place]) Active Problems:    Hyperlipidemia   HYPERTENSION, BENIGN   Hypertrophic obstructive cardiomyopathy (HCC)   Edema   Alcoholic cirrhosis (HCC)   CKD (chronic kidney disease), stage III   Duodenal ulcer disease   Melena   Gastritis   Elevated troponin   Demand ischemia of myocardium (HCC)   Acute blood loss anemia  Discharge Instructions  Discharge Instructions    Diet - low sodium heart healthy    Complete by:  As directed      Increase activity slowly    Complete by:  As directed             Medication List    STOP taking these medications        aspirin 81 MG tablet     DEXILANT 60 MG capsule  Generic drug:  dexlansoprazole      TAKE these medications        acetaminophen 325 MG tablet  Commonly known as:  TYLENOL  Take 650 mg by mouth every 6 (six) hours as needed for pain.     amLODipine 10 MG tablet  Commonly known as:  NORVASC  Take 1 tablet (10 mg total) by mouth daily.     furosemide 40 MG tablet  Commonly known as:  LASIX  Take 1 tablet (40 mg total) by mouth daily.     isosorbide mononitrate 30 MG 24 hr tablet  Commonly known as:  IMDUR  Take 1 tablet (30 mg total) by mouth daily.     metoprolol succinate 100 MG 24 hr tablet  Commonly known as:  TOPROL-XL  TAKE ONE AND ONE-HALF TABLETS DAILY WITHOR IMMEDIATELY FOLLOWING A MEAL     multivitamin with minerals Tabs tablet  Take 1 tablet by mouth daily.     pantoprazole 40 MG tablet  Commonly known as:  PROTONIX  Take 1 tablet (40 mg total) by mouth 2 (two) times daily.     simvastatin 20 MG tablet  Commonly known as:  ZOCOR  TAKE ONE TABLET AT BEDTIME     spironolactone 25 MG tablet  Commonly known as:  ALDACTONE  Take 0.5 tablets (12.5 mg total) by mouth every other day.     zolpidem 5 MG tablet  Commonly known as:  AMBIEN  TAKE ONE TABLET AT BEDTIME AS NEEDED FORSLEEP           Follow-up Information    Follow up with [name], MD. Schedule an appointment as soon as possible for a visit in 1 week.    Specialties:  Internal Medicine, Radiology   Why:  Hospital Follow Up   Contact information:   [address] [place] [address] [phone]       Follow up with Lauree [name], MD. Schedule an appointment as soon as possible for a visit in 1 week.   Specialty:  Cardiology   Why:  Hospital Follow UP    Contact information:   [address] [place] [address] [phone]      No Known Allergies  Consultations:  cardiology  Procedures/Studies: Dg Chest 2 View  04/29/2016  CLINICAL DATA:  Chest pain and shortness of breath for 2 days. EXAM: CHEST  2 VIEW COMPARISON:  Single-view of the chest 04/13/2016. PA and lateral chest 10/04/2015. FINDINGS: Elevation of the right hemidiaphragm relative to the left is unchanged. The lungs are clear. Heart size is normal. No pneumothorax or pleural effusion. IMPRESSION: No acute disease. Electronically Signed   By: Inge Rise M.D.   On: 04/29/2016 07:33   Dg Chest Port 1 View  04/13/2016  CLINICAL DATA:  GI bleeding and shortness of breath. EXAM: PORTABLE CHEST 1 VIEW COMPARISON:  10/04/2015 FINDINGS: The heart size and mediastinal contours are within normal limits. Stable elevation of the right hemidiaphragm with volume loss of the right lung. There is no evidence of pulmonary edema, consolidation, pneumothorax, nodule or pleural fluid. The visualized skeletal structures are unremarkable. IMPRESSION: No active disease. Electronically Signed   By: Aletta Edouard M.D.   On: 04/13/2016 11:34    EGD 6/23 Impression:  - Normal esophagus.  - Small hiatal hernia.  - Gastritis. Biopsied.  - Multiple non-bleeding duodenal ulcers with no   stigmata of bleeding. Recommendation:  - Resume previous diet.  - No aspirin, ibuprofen, naproxen, or other    non-steroidal anti-inflammatory drugs.  - Await pathology results.  - Use a proton pump inhibitor PO BID for 1 month,   then once daily indefinitely.   Subjective: Pt insisting that he be discharged.  He says he feels fine and will follow up as recommended.    Discharge Exam: Filed Vitals:   05/01/16 1929 05/02/16 0614  BP: 133/60 143/104  Pulse: 80 72  Temp: 97.4 F (36.3 C) 97.5 F (36.4 C)  Resp: 18 16   Filed Vitals:   05/01/16 1215 05/01/16 1508 05/01/16 1929 05/02/16 0614  BP: 167/74 127/59 133/60 143/104  Pulse: 74 75 80 72  Temp:  98.1 F (36.7 C) 97.4 F (36.3 C) 97.5 F (36.4 C)  TempSrc:  Oral Oral Oral  Resp: 15 16 18 16   Height:      Weight:      SpO2: 96% 98% 96% 98%   General: Well developed, well nourished, male in no acute distress Head: Eyes PERRLA, No xanthomas. Normocephalic and atraumatic, oropharynx without edema or exudate.  Lungs: Resp regular and unlabored, CTA. Heart: RRR no s3, s4 . Harsh 2/6 systolic murmur AR murmur  Neck: No carotid bruits. No lymphadenopathy. No JVD. Abdomen: Bowel sounds present, abdomen soft and non-tender without masses or hernias noted. Msk: No spine or cva tenderness. No weakness, no joint deformities or effusions. Extremities: No clubbing, cyanosis. 1-2 + BL LE edema with skin changes/ hyperkeratosis. DP/PT/Radials 2+ and equal bilaterally. Neuro: Alert and oriented X 3. No focal deficits noted. Psych: Good affect, responds appropriately Skin: No rashes or lesions noted.  The results of significant diagnostics from this hospitalization (including imaging, microbiology, ancillary and laboratory) are listed below for reference.     Microbiology: No results found for this or any previous visit (from the past 240 hour(s)).   Labs: BNP (last 3 results)  Recent Labs  04/13/16 1046  BNP 107.2*   Basic  Metabolic Panel:  Recent Labs Lab 04/29/16 0656 04/30/16 0422  NA 138 134*  K 4.7 4.6  CL 107 107  CO2 21* 21*  GLUCOSE 112* 99  BUN 45* 34*  CREATININE 1.83* 1.48*  CALCIUM 9.2 8.3*   Liver Function Tests:  Recent Labs Lab 04/30/16 0422  AST 19  ALT 15*  ALKPHOS 63  BILITOT  0.6  PROT 5.5*  ALBUMIN 2.7*   No results for input(s): LIPASE, AMYLASE in the last 168 hours. No results for input(s): AMMONIA in the last 168 hours. CBC:  Recent Labs Lab 04/29/16 0656 04/30/16 0422 04/30/16 2108  WBC 7.9 7.5  --   HGB 9.1* 8.2* 11.0*  HCT 28.8* 25.6* 33.5*  MCV 97.3 98.1  --   PLT 236 205  --    Cardiac Enzymes:  Recent Labs Lab 04/29/16 0656 04/29/16 0900 04/29/16 1128 04/29/16 1448  TROPONINI 0.34* 0.28* 0.28* 0.27*   BNP: Invalid input(s): POCBNP CBG: No results for input(s): GLUCAP in the last 168 hours. D-Dimer No results for input(s): DDIMER in the last 72 hours. Hgb A1c No results for input(s): HGBA1C in the last 72 hours. Lipid Profile  Recent Labs  04/30/16 0422  CHOL 93  HDL 32*  LDLCALC 35  TRIG 129  CHOLHDL 2.9   Thyroid function studies No results for input(s): TSH, T4TOTAL, T3FREE, THYROIDAB in the last 72 hours.  Invalid input(s): FREET3 Anemia work up No results for input(s): VITAMINB12, FOLATE, FERRITIN, TIBC, IRON, RETICCTPCT in the last 72 hours. Urinalysis    Component Value Date/Time   COLORURINE YELLOW 04/13/2016 1154   APPEARANCEUR CLEAR 04/13/2016 1154   LABSPEC 1.013 04/13/2016 1154   PHURINE 5.0 04/13/2016 1154   GLUCOSEU NEGATIVE 04/13/2016 1154   GLUCOSEU NEGATIVE 07/17/2015 1148   HGBUR NEGATIVE 04/13/2016 1154   BILIRUBINUR NEGATIVE 04/13/2016 1154   KETONESUR NEGATIVE 04/13/2016 1154   PROTEINUR 30* 04/13/2016 1154   UROBILINOGEN 0.2 07/17/2015 1148   NITRITE NEGATIVE 04/13/2016 1154   LEUKOCYTESUR NEGATIVE 04/13/2016 1154   Sepsis Labs Invalid input(s): PROCALCITONIN,  WBC,   LACTICIDVEN Microbiology No results found for this or any previous visit (from the past 240 hour(s)).  Time coordinating discharge: 31 minutes  SIGNED:  Irwin Brakeman, MD  [facility] 05/02/2016, 10:47 AM Pager   If 7PM-7AM, please contact night-coverage [email or web address] Password [email or web address]

## 2016-05-02 NOTE — Progress Notes (Signed)
Patient ID: [name], male   DOB: [birth year], 82 y.o.   MRN: [id number]   TELEMETRY: NSR 05/02/2016   Filed Vitals:   05/01/16 1215 05/01/16 1508 05/01/16 1929 05/02/16 0614  BP: 167/74 127/59 133/60 143/104  Pulse: 74 75 80 72  Temp:  98.1 F (36.7 C) 97.4 F (36.3 C) 97.5 F (36.4 C)  TempSrc:  Oral Oral Oral  Resp: 15 16 18 16   Height:      Weight:      SpO2: 96% 98% 96% 98%    Intake/Output Summary (Last 24 hours) at 05/02/16 0841 Last data filed at 05/02/16 [id number]  Gross per 24 hour  Intake    903 ml  Output    375 ml  Net    528 ml   Filed Weights   04/29/16 1100 04/30/16 0500 05/01/16 0400  Weight: 144 lb 14.4 oz (65.726 kg) 148 lb 1.6 oz (67.178 kg) 147 lb 8 oz (66.906 kg)    Subjective No further chest pain or SOB. Awaiting upper endoscopy.   . sodium chloride   Intravenous Once  . amLODipine  10 mg Oral Daily  . furosemide  40 mg Oral Daily  . isosorbide mononitrate  30 mg Oral Daily  . metoprolol succinate  100 mg Oral Daily  . pantoprazole  40 mg Oral BID AC  . simvastatin  20 mg Oral QHS  . sodium chloride flush  3 mL Intravenous Q12H   . sodium chloride Stopped (05/01/16 1146)    LABS: Basic Metabolic Panel:  Recent Labs  04/30/16 0422  NA 134*  K 4.6  CL 107  CO2 21*  GLUCOSE 99  BUN 34*  CREATININE 1.48*  CALCIUM 8.3*   Liver Function Tests:  Recent Labs  04/30/16 0422  AST 19  ALT 15*  ALKPHOS 63  BILITOT 0.6  PROT 5.5*  ALBUMIN 2.7*   CBC:  Recent Labs  04/30/16 0422 04/30/16 2108  WBC 7.5  --   HGB 8.2* 11.0*  HCT 25.6* 33.5*  MCV 98.1  --   PLT 205  --    Cardiac Enzymes:  Recent Labs  04/29/16 0900 04/29/16 1128 04/29/16 1448  TROPONINI 0.28* 0.28* 0.27*    Radiology/Studies:  No results found. Ecg not ordered today.  PHYSICAL EXAM General: Well developed, well nourished, male in no acute distress Head: Eyes PERRLA, No xanthomas. Normocephalic and atraumatic, oropharynx without edema or  exudate.  Lungs: Resp regular and unlabored, CTA. Heart: RRR no s3, s4 . Harsh 2/6 systolic murmur AR murmur  Neck: No carotid bruits. No lymphadenopathy. No JVD. Abdomen: Bowel sounds present, abdomen soft and non-tender without masses or hernias noted. Msk: No spine or cva tenderness. No weakness, no joint deformities or effusions. Extremities: No clubbing, cyanosis. 1-2 + BL LE edema with skin changes/ hyperkeratosis.  DP/PT/Radials 2+ and equal bilaterally. Neuro: Alert and oriented X 3. No focal deficits noted. Psych: Good affect, responds appropriately Skin: No rashes or lesions noted.   Echo:  Reviewed Study Conclusions  - Left ventricle: The cavity size was normal. There was moderate  concentric hypertrophy. Systolic function was normal. The  estimated ejection fraction was in the range of 60% to 65%. Wall  motion was normal; there were no regional wall motion  abnormalities. Doppler parameters are consistent with abnormal  left ventricular relaxation (grade 1 diastolic dysfunction).  Doppler parameters are consistent with elevated ventricular  end-diastolic filling pressure. - Aortic valve: There was mild stenosis. There was  moderate  regurgitation. Mean gradient (S): 14 mm Hg. Peak gradient (S): 31  mm Hg. Valve area (VTI): 2.78 cm^2. Valve area (Vmax): 2.69 cm^2.  Valve area (Vmean): 2.49 cm^2. - Aortic root: The aortic root was normal in size. - Mitral valve: Structurally normal valve. There was no  regurgitation. - Left atrium: The atrium was moderately dilated. - Right ventricle: The cavity size was normal. Wall thickness was  normal. Systolic function was normal. - Right atrium: The atrium was normal in size. - Tricuspid valve: There was moderate regurgitation. - Pulmonic valve: There was mild regurgitation. - Pulmonary arteries: Systolic pressure was mildly increased. PA  peak pressure: 43 mm Hg (S). - Inferior vena cava: The vessel was normal  in size. - Pericardium, extracardiac: There was no pericardial effusion.  ASSESSMENT AND PLAN:  [name] is a 82 y.o. male with a history of hypertrophic cardiomyopathy, HTN, HLD, RBBB, chronic lower extremity edema, alcohol abuse with previous ascites, portal gastropathy, coagulopathy and mental status issues who presented to [facility] ED for evaluation of chest pain.  1. Chest pain with elevated troponin. Troponin trend is flat suggesting this may be more demand ischemia in setting of severe anemia. No recurrent chest pain.  Repeat ECG with RBBB and stable from previous. He is on triple antianginal therapy with beta blocker, nitrates, and amlodipine.Echo with normal EF and no RWMA;s  See note Dr Martinique unable to have DAT at this time due to GI bleeding som no cath or stress testing  2. GI bleed. Multiple potential causes with portal gastropathy, history of duodenal ulcers, duodenitis, and colon polyps. GI consulted who are planning colonoscopy and upper endoscopy. Patient refusing colonoscopy but willing to proceed with EGD. This will be done today.  3. HCM.no change in murmur echo with mild AS and moderate AR  4. Anemia. S/p 2 U PRBC. Now Hg 8.2--> 11.0 5. CKD stage 3.  Lab Results  Component Value Date   CREATININE 1.48* 04/30/2016   BUN 34* 04/30/2016   NA 134* 04/30/2016   K 4.6 04/30/2016   CL 107 04/30/2016   CO2 21* 04/30/2016    6. HTN controlled 7. Chronic LE edema.  8. Memory deficit. ? Dementia.   Present on Admission:  . NSTEMI (non-ST elevated myocardial infarction) ([place]) . Hyperlipidemia . HYPERTENSION, BENIGN . Hypertrophic obstructive cardiomyopathy ([place]) . Alcoholic cirrhosis ([place]) . Edema . CKD (chronic kidney disease), stage III  Will arrange outpatient f/u with DR Novella Olive

## 2016-05-03 ENCOUNTER — Encounter (HOSPITAL_COMMUNITY): Payer: Self-pay | Admitting: Internal Medicine

## 2016-05-04 ENCOUNTER — Encounter: Payer: Self-pay | Admitting: Internal Medicine

## 2016-05-05 ENCOUNTER — Telehealth: Payer: Self-pay | Admitting: *Deleted

## 2016-05-05 NOTE — Telephone Encounter (Signed)
Tried calling pt to set up [place] f/u no answer LMOM RTC...Johny Chess

## 2016-05-05 NOTE — Telephone Encounter (Signed)
Received message from Dr. Angelena Form that pt had been in hospital recently and needed cardiology follow up.  I spoke with pt and explained to him that Dr. Angelena Form had appointments open for June 30,2017 and after that follow up would need to be with PA or NP as Dr. Camillia Herter July office schedule is full at this time.   Pt is unable to schedule appt at this time and requests we call him back in a week to schedule this appt.  Will have schedulers contact pt next week to arrange appt in July with NP or PA.

## 2016-05-06 NOTE — Telephone Encounter (Signed)
Called pt to get him set-up for hosp f/u. Pt states he is not able to come in his wife pass last night. He will give [place] a call back later to make appt...[name]

## 2016-05-18 ENCOUNTER — Ambulatory Visit: Payer: Self-pay | Admitting: Gastroenterology

## 2016-05-18 ENCOUNTER — Telehealth: Payer: Self-pay

## 2016-05-18 NOTE — Telephone Encounter (Signed)
Patient states since his wife passed. He is having a hard time sleeping. He wanted to know if you could call him in a rx to help him sleep. Please advise or follow up. Thank you.

## 2016-05-19 MED ORDER — ALPRAZOLAM 0.25 MG PO TABS: 0.2500 mg | ORAL_TABLET | Freq: Every evening | ORAL | Status: DC | PRN

## 2016-05-19 NOTE — Telephone Encounter (Signed)
Done hardcopy to Corinne  

## 2016-05-19 NOTE — Telephone Encounter (Signed)
Please advise 

## 2016-05-19 NOTE — Telephone Encounter (Signed)
Medication faxed to pharmacy 

## 2016-05-20 NOTE — Telephone Encounter (Signed)
Appt has been scheduled for 08/03/16 with Dr. Angelena Form. I placed call to pt to see if sooner appt needed. Left message to call back.

## 2016-05-21 ENCOUNTER — Telehealth: Payer: Self-pay | Admitting: Cardiovascular Disease

## 2016-05-21 NOTE — Telephone Encounter (Signed)
F/U   Pt returning call to the nurse. Please call.

## 2016-05-21 NOTE — Telephone Encounter (Signed)
See phone note dated 6/27 for documentation regarding this call.

## 2016-05-21 NOTE — Telephone Encounter (Signed)
I spoke with pt. He does not feel he needs appt earlier than 08/03/16.  He will plan on keeping this appt. I asked him to contact us if he felt he needed to be seen sooner.

## 2016-05-25 ENCOUNTER — Telehealth: Payer: Self-pay | Admitting: Gastroenterology

## 2016-05-25 ENCOUNTER — Telehealth: Payer: Self-pay | Admitting: *Deleted

## 2016-05-25 ENCOUNTER — Telehealth: Payer: Self-pay | Admitting: Emergency Medicine

## 2016-05-25 DIAGNOSIS — R600 Localized edema: Secondary | ICD-10-CM

## 2016-05-25 MED ORDER — ZOLPIDEM TARTRATE 5 MG PO TABS: ORAL_TABLET | ORAL | Status: DC

## 2016-05-25 MED ORDER — FUROSEMIDE 40 MG PO TABS: 40.0000 mg | ORAL_TABLET | Freq: Every day | ORAL | Status: DC

## 2016-05-25 NOTE — Telephone Encounter (Signed)
RX faxed to [facility]

## 2016-05-25 NOTE — Telephone Encounter (Signed)
Please advise 

## 2016-05-25 NOTE — Telephone Encounter (Signed)
Medication refilled

## 2016-05-25 NOTE — Telephone Encounter (Signed)
Received call pt requesting refill on his zolpidem 5 mg, also states he need refill on furosemide sent to [facility]. Pt states he is out of medication and needing med refill today. Inform pt MD is out of office today maybe tomorrow for approval. Pt states he is a Dr wanting another MD to approve inform will forward to another MD for approval.../lmb

## 2016-05-25 NOTE — Telephone Encounter (Signed)
The pt's son called to get a follow up appt for the pt, he was in the hospital for a GI bleed 04/2016.  He is on protonix BID x 1 month and was told to decrease to 1 daily after 1 month indefinitely.  He has no GI symptoms at this time.  NO bleeding or SOB, pain, or dark stools.  He has been added to Dr Lynne Leader schedule for 07/23/16 and put on the wait list.  He was also advised to call back if any symptoms of bleeding returns.  He will monitor for SOB,  black stools, vomiting blood, coffee ground emesis, pain, fever and call back for triage or if severe go to the ED for evaluation. Forwarded to Dr Fuller Plan for further recommednations .

## 2016-05-25 NOTE — Telephone Encounter (Signed)
Pt called and needs a prescription refill on zolpidem (AMBIEN) 5 MG tablet. [facility] is [facility]. Please follow up thanks.

## 2016-05-25 NOTE — Telephone Encounter (Signed)
Pt has been added to [facility] schedule for 06/01/16 at 130 pm, the pt's son has been notified.

## 2016-05-25 NOTE — Telephone Encounter (Signed)
It would be better if he has an sooner post hospital follow up appt. Please find an APP appt in about 1 month if we have that available.

## 2016-05-26 NOTE — Telephone Encounter (Signed)
Already done per Terri Piedra on July 17

## 2016-05-28 ENCOUNTER — Encounter: Payer: Self-pay | Admitting: Internal Medicine

## 2016-05-28 ENCOUNTER — Ambulatory Visit (INDEPENDENT_AMBULATORY_CARE_PROVIDER_SITE_OTHER)
Admission: RE | Admit: 2016-05-28 | Discharge: 2016-05-28 | Disposition: A | Discharge status: Home / Self Care | Payer: Medicare Other | Source: Ambulatory Visit | Attending: Internal Medicine | Admitting: Internal Medicine

## 2016-05-28 ENCOUNTER — Ambulatory Visit (INDEPENDENT_AMBULATORY_CARE_PROVIDER_SITE_OTHER): Payer: Medicare Other | Admitting: Internal Medicine

## 2016-05-28 ENCOUNTER — Other Ambulatory Visit (INDEPENDENT_AMBULATORY_CARE_PROVIDER_SITE_OTHER): Payer: Medicare Other

## 2016-05-28 ENCOUNTER — Other Ambulatory Visit: Payer: Self-pay | Admitting: Internal Medicine

## 2016-05-28 VITALS — BP 122/64 | HR 75 | Temp 98.4°F | Resp 20 | Wt 158.0 lb

## 2016-05-28 DIAGNOSIS — N183 Chronic kidney disease, stage 3 unspecified: Secondary | ICD-10-CM

## 2016-05-28 DIAGNOSIS — E875 Hyperkalemia: Secondary | ICD-10-CM

## 2016-05-28 DIAGNOSIS — I10 Essential (primary) hypertension: Secondary | ICD-10-CM | POA: Diagnosis not present

## 2016-05-28 DIAGNOSIS — R0989 Other specified symptoms and signs involving the circulatory and respiratory systems: Secondary | ICD-10-CM | POA: Diagnosis not present

## 2016-05-28 DIAGNOSIS — R0600 Dyspnea, unspecified: Secondary | ICD-10-CM

## 2016-05-28 LAB — CBC WITH DIFFERENTIAL/PLATELET
BASOS ABS: 0 10*3/uL (ref 0.0–0.1)
Basophils Relative: 0.2 % (ref 0.0–3.0)
EOS ABS: 0.3 10*3/uL (ref 0.0–0.7)
Eosinophils Relative: 2.6 % (ref 0.0–5.0)
HCT: 35.3 % — ABNORMAL LOW (ref 39.0–52.0)
Hemoglobin: 11.6 g/dL — ABNORMAL LOW (ref 13.0–17.0)
LYMPHS ABS: 1 10*3/uL (ref 0.7–4.0)
Lymphocytes Relative: 9.9 % — ABNORMAL LOW (ref 12.0–46.0)
MCHC: 33 g/dL (ref 30.0–36.0)
MCV: 93.9 fl (ref 78.0–100.0)
MONO ABS: 1 10*3/uL (ref 0.1–1.0)
Monocytes Relative: 10.1 % (ref 3.0–12.0)
NEUTROS ABS: 7.5 10*3/uL (ref 1.4–7.7)
NEUTROS PCT: 77.2 % — AB (ref 43.0–77.0)
PLATELETS: 234 10*3/uL (ref 150.0–400.0)
RBC: 3.75 Mil/uL — ABNORMAL LOW (ref 4.22–5.81)
RDW: 15.3 % (ref 11.5–15.5)
WBC: 9.7 10*3/uL (ref 4.0–10.5)

## 2016-05-28 LAB — BASIC METABOLIC PANEL
BUN: 39 mg/dL — ABNORMAL HIGH (ref 6–23)
CALCIUM: 9.1 mg/dL (ref 8.4–10.5)
CO2: 22 meq/L (ref 19–32)
CREATININE: 1.72 mg/dL — AB (ref 0.40–1.50)
Chloride: 108 mEq/L (ref 96–112)
GFR: 40.37 mL/min — ABNORMAL LOW (ref >60.00)
GLUCOSE: 82 mg/dL (ref 70–99)
Potassium: 5.8 mEq/L — ABNORMAL HIGH (ref 3.5–5.1)
Sodium: 138 mEq/L (ref 135–145)

## 2016-05-28 NOTE — Progress Notes (Signed)
Pre visit review using our clinic review tool, if applicable. No additional management support is needed unless otherwise documented below in the visit note. 

## 2016-05-28 NOTE — Patient Instructions (Addendum)
Please continue all other medications as before, and refills have been done if requested.  Please have the pharmacy call with any other refills you may need.  Please keep your appointments with your specialists as you may have planned  Please go to the XRAY Department in the Basement (go straight as you get off the elevator) for the x-ray testing  Please go to the LAB in the Basement (turn left off the elevator) for the tests to be done today  You will be contacted by phone if any changes need to be made immediately.  Otherwise, you will receive a letter about your results with an explanation, but please check with MyChart first.  Please remember to sign up for MyChart if you have not done so, as this will be important to you in the future with finding out test results, communicating by private email, and scheduling acute appointments online when needed. 

## 2016-05-28 NOTE — Progress Notes (Signed)
Subjective:    Patient ID: [name], male    DOB: [birth year], 82 y.o.   MRN: [id number]  HPI  Here to f/u after recent hospn with bleeding duodenal ulcer, gastritis on EGD, now s/p transfusion 2 u PTBC , last Hgb > 11.  NOw, Denies worsening reflux, abd pain, dysphagia, n/v, bowel change or blood.  Pt denies chest pain, increased sob or doe, wheezing, orthopnea, PND, increased LE swelling, palpitations, dizziness or syncope.  Has appt's with GI and card very soon.  Wt is up quite a bit due to better eating after wt loss with illness episode. Wt Readings from Last 3 Encounters:  05/28/16 158 lb (71.668 kg)  05/01/16 147 lb 8 oz (66.906 kg)  04/13/16 162 lb (73.483 kg)  Wife died 1 mo ago, just a few days after his dc to home.  No falls, walks with cane and still living at [facility].     Past Medical History:  Diagnosis Date  . Alcoholic cirrhosis ([place])   . 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 Alcoholism ([place])   . Ascites 11/2004   "early" SBP on paracentesis.   . Chronic gastritis 05/01/2016  . CKD (chronic kidney disease), stage III 10/11/2015  . Colon polyp perhaps in 1990s   pathology/type not known  . Diverticulosis of colon perhaps in 1990s  . Fracture of wrist 2014 spring vs early summer   refused orthopods suggestion to set the bone.   Marland Kitchen GERD (gastroesophageal reflux disease)   . HLD (hyperlipidemia)   . HTN (hypertension)   . Hypertrophic cardiomyopathy ([place])   . Portal hypertensive gastropathy 2006  . Renal insufficiency   . Rhabdomyolysis 01/2005   Past Surgical History:  Procedure Laterality Date  . BASAL CELL CARCINOMA EXCISION Left 11/2012   lower eyelid  . ESOPHAGOGASTRODUODENOSCOPY N/A 08/04/2013   Procedure: ESOPHAGOGASTRODUODENOSCOPY (EGD);  Surgeon: Ladene Artist, MD;  Location: [facility] ENDOSCOPY;  Service: Endoscopy;  Laterality: N/A;  . ESOPHAGOGASTRODUODENOSCOPY (EGD) WITH PROPOFOL N/A 05/01/2016   Procedure:  ESOPHAGOGASTRODUODENOSCOPY (EGD) WITH PROPOFOL;  Surgeon: Jerene Bears, MD;  Location: [facility] ENDOSCOPY;  Service: Endoscopy;  Laterality: N/A;  . INGUINAL HERNIA REPAIR    . MELANOMA EXCISION Left 02/2011   malignant melanoma of thigh    reports that he has never smoked. He has never used smokeless tobacco. He reports that he does not drink alcohol or use drugs. family history includes Cancer (age of onset: 61) in his father; Heart disease in his mother and sister. No Known Allergies Current Outpatient Prescriptions on File Prior to Visit  Medication Sig Dispense Refill  . acetaminophen (TYLENOL) 325 MG tablet Take 650 mg by mouth every 6 (six) hours as needed for pain.    Marland Kitchen ALPRAZolam (XANAX) 0.25 MG tablet Take 1 tablet (0.25 mg total) by mouth at bedtime as needed for anxiety. 30 tablet 1  . amLODipine (NORVASC) 10 MG tablet Take 1 tablet (10 mg total) by mouth daily. 90 tablet 1  . furosemide (LASIX) 40 MG tablet Take 1 tablet (40 mg total) by mouth daily. 90 tablet 0  . isosorbide mononitrate (IMDUR) 30 MG 24 hr tablet Take 1 tablet (30 mg total) by mouth daily. 30 tablet 0  . metoprolol succinate (TOPROL-XL) 100 MG 24 hr tablet TAKE ONE AND ONE-HALF TABLETS DAILY WITHOR IMMEDIATELY FOLLOWING A MEAL 45 tablet 5  . Multiple Vitamin (MULTIVITAMIN WITH MINERALS) TABS Take 1 tablet by mouth daily.    . pantoprazole (PROTONIX) 40  MG tablet Take 1 tablet (40 mg total) by mouth 2 (two) times daily. 60 tablet 0  . simvastatin (ZOCOR) 20 MG tablet TAKE ONE TABLET AT BEDTIME 90 tablet 0  . zolpidem (AMBIEN) 5 MG tablet TAKE ONE TABLET AT BEDTIME AS NEEDED FORSLEEP 30 tablet 0   No current facility-administered medications on file prior to visit.    Review of Systems  Constitutional: Negative for unusual diaphoresis or night sweats HENT: Negative for ear swelling or discharge Eyes: Negative for worsening visual haziness  Respiratory: Negative for choking and stridor.   Gastrointestinal: Negative  for distension or worsening eructation Genitourinary: Negative for retention or change in urine volume.  Musculoskeletal: Negative for other MSK pain or swelling Skin: Negative for color change and worsening wound Neurological: Negative for tremors and numbness other than noted  Psychiatric/Behavioral: Negative for decreased concentration or agitation other than above       Objective:   Physical Exam BP 122/64 mmHg  Pulse 75  Temp(Src) 98.4 F (36.9 C) (Oral)  Resp 20  Wt 158 lb (71.668 kg)  SpO2 97% VS noted,  Constitutional: Pt appears in no apparent distress HENT: Head: NCAT.  Right Ear: External ear normal.  Left Ear: External ear normal.  Eyes: . Pupils are equal, round, and reactive to light. Conjunctivae and EOM are normal Neck: Normal range of motion. Neck supple.  Cardiovascular: Normal rate and regular rhythm.   Pulmonary/Chest: Effort normal and breath sounds without wheezing but with bibas ? Dry rales left > right.  Abd:  Soft, NT, ND, + BS Neurological: Pt is alert. Not confused , motor grossly intact Skin: Skin is warm. No rash, no LE edema Psychiatric: Pt behavior is normal. No agitation.   Most recent cxr: CLINICAL DATA: Chest pain and shortness of breath for 2 days.  EXAM: CHEST 2 VIEW  COMPARISON: Single-view of the chest 04/13/2016. PA and lateral chest 10/04/2015.  FINDINGS: Elevation of the right hemidiaphragm relative to the left is unchanged. The lungs are clear. Heart size is normal. No pneumothorax or pleural effusion.  IMPRESSION: No acute disease.   Electronically Signed  By: Inge Rise M.D.  On: 04/29/2016 07:33         Most recent echo summary:  05-01-2016 - summary  - Left ventricle: The cavity size was normal. There was moderate  concentric hypertrophy. Systolic function was normal. The  estimated ejection fraction was in the range of 60% to 65%. Wall  motion was normal; there were no regional wall  motion  abnormalities. Doppler parameters are consistent with abnormal  left ventricular relaxation (grade 1 diastolic dysfunction).  Doppler parameters are consistent with elevated ventricular  end-diastolic filling pressure. - Aortic valve: There was mild stenosis. There was moderate  regurgitation. Mean gradient (S): 14 mm Hg. Peak gradient (S): 31  mm Hg. Valve area (VTI): 2.78 cm^2. Valve area (Vmax): 2.69 cm^2.  Valve area (Vmean): 2.49 cm^2. - Aortic root: The aortic root was normal in size. - Mitral valve: Structurally normal valve. There was no  regurgitation. - Left atrium: The atrium was moderately dilated. - Right ventricle: The cavity size was normal. Wall thickness was  normal. Systolic function was normal. - Right atrium: The atrium was normal in size. - Tricuspid valve: There was moderate regurgitation. - Pulmonic valve: There was mild regurgitation. - Pulmonary arteries: Systolic pressure was mildly increased. PA  peak pressure: 43 mm Hg (S). - Inferior vena cava: The vessel was normal in size. - Pericardium, extracardiac:  There was no pericardial effusion.      Assessment & Plan:

## 2016-05-29 ENCOUNTER — Encounter: Payer: Self-pay | Admitting: Internal Medicine

## 2016-05-31 NOTE — Assessment & Plan Note (Signed)
stable overall by history and exam, recent data reviewed with pt, and pt to continue medical treatment as before,  to f/u any worsening symptoms or concerns BP Readings from Last 3 Encounters:  05/28/16 122/64  05/02/16 (!) 143/104  04/13/16 141/74

## 2016-05-31 NOTE — Assessment & Plan Note (Signed)
?   Significance, none on prior exam but pt is asympt, for cxr today, r/o subclinical pna

## 2016-05-31 NOTE — Assessment & Plan Note (Signed)
stable overall by history and exam, recent data reviewed with pt, and pt to continue medical treatment as before,  to f/u any worsening symptoms or concerns Lab Results  Component Value Date   CREATININE 1.72 (H) 05/28/2016

## 2016-06-01 ENCOUNTER — Ambulatory Visit: Payer: Self-pay | Admitting: Physician Assistant

## 2016-06-02 ENCOUNTER — Other Ambulatory Visit: Payer: Self-pay | Admitting: Internal Medicine

## 2016-06-02 NOTE — Telephone Encounter (Signed)
Isosorbide done erx

## 2016-06-03 ENCOUNTER — Encounter (INDEPENDENT_AMBULATORY_CARE_PROVIDER_SITE_OTHER): Payer: Self-pay

## 2016-06-03 ENCOUNTER — Ambulatory Visit (INDEPENDENT_AMBULATORY_CARE_PROVIDER_SITE_OTHER): Payer: Medicare Other | Admitting: Cardiovascular Disease

## 2016-06-03 ENCOUNTER — Encounter: Payer: Self-pay | Admitting: Cardiovascular Disease

## 2016-06-03 ENCOUNTER — Ambulatory Visit: Payer: Self-pay | Admitting: Cardiovascular Disease

## 2016-06-03 VITALS — BP 144/62 | HR 88 | Ht 68.0 in | Wt 165.0 lb

## 2016-06-03 DIAGNOSIS — I10 Essential (primary) hypertension: Secondary | ICD-10-CM | POA: Diagnosis not present

## 2016-06-03 DIAGNOSIS — I5032 Chronic diastolic (congestive) heart failure: Secondary | ICD-10-CM

## 2016-06-03 DIAGNOSIS — E785 Hyperlipidemia, unspecified: Secondary | ICD-10-CM | POA: Diagnosis not present

## 2016-06-03 DIAGNOSIS — I421 Obstructive hypertrophic cardiomyopathy: Secondary | ICD-10-CM | POA: Diagnosis not present

## 2016-06-03 NOTE — Patient Instructions (Signed)

## 2016-06-03 NOTE — Progress Notes (Signed)
Chief Complaint  Patient presents with  . 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 He has been followed in the past by Dr. Lia Foyer. Last echo 07/26/15 with moderate basal septal hypertrophy with no clear HOCM physiology, normal LV function. He is a retired Industrial/product designer. Admission to [facility] with GI bleeding secondary to duodenal ulcer, gastritis 04/29/16 and required blood transfusion. Echo with normal LV function, moderate AI.   He is here today for cardiac follow up. He has had no chest pain or dizziness. He has occasional dyspnea but unchanged. Still with LE edema. He thinks this is unchanged. Taking Lasix 40 mg daily.   Primary Care Physician: [name], MD  Past Medical History:  Diagnosis Date  . Alcoholic cirrhosis ([place])   . 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 Alcoholism ([place])   . Ascites 11/2004   "early" SBP on paracentesis.   . Chronic gastritis 05/01/2016  . CKD (chronic kidney disease), stage III 10/11/2015  . Colon polyp perhaps in 1990s   pathology/type not known  . Diverticulosis of colon perhaps in 1990s  . Fracture of wrist 2014 spring vs early summer   refused orthopods suggestion to set the bone.   Marland Kitchen GERD (gastroesophageal reflux disease)   . HLD (hyperlipidemia)   . HTN (hypertension)   . Hypertrophic cardiomyopathy ([place])   . Portal hypertensive gastropathy 2006  . Renal insufficiency   . Rhabdomyolysis 01/2005    Past Surgical History:  Procedure Laterality Date  . BASAL CELL CARCINOMA EXCISION Left 11/2012   lower eyelid  . ESOPHAGOGASTRODUODENOSCOPY N/A 08/04/2013   Procedure: ESOPHAGOGASTRODUODENOSCOPY (EGD);  Surgeon: Ladene Artist, MD;  [facility] ENDOSCOPY;  Service: Endoscopy;  Laterality: N/A;  . ESOPHAGOGASTRODUODENOSCOPY (EGD) WITH PROPOFOL N/A 05/01/2016   Procedure: ESOPHAGOGASTRODUODENOSCOPY (EGD) WITH PROPOFOL;   Surgeon: Jerene Bears, MD;  Location: [facility] ENDOSCOPY;  Service: Endoscopy;  Laterality: N/A;  . INGUINAL HERNIA REPAIR    . MELANOMA EXCISION Left 02/2011   malignant melanoma of thigh    Current Outpatient Prescriptions  Medication Sig Dispense Refill  . acetaminophen (TYLENOL) 325 MG tablet Take 650 mg by mouth every 6 (six) hours as needed for pain.    Marland Kitchen ALPRAZolam (XANAX) 0.25 MG tablet Take 1 tablet (0.25 mg total) by mouth at bedtime as needed for anxiety. 30 tablet 1  . amLODipine (NORVASC) 10 MG tablet Take 1 tablet (10 mg total) by mouth daily. 90 tablet 1  . furosemide (LASIX) 40 MG tablet Take 1 tablet (40 mg total) by mouth daily. 90 tablet 0  . isosorbide mononitrate (IMDUR) 30 MG 24 hr tablet TAKE ONE TABLET EACH DAY 30 tablet 11  . metoprolol succinate (TOPROL-XL) 100 MG 24 hr tablet TAKE ONE AND ONE-HALF TABLETS DAILY WITHOR IMMEDIATELY FOLLOWING A MEAL 45 tablet 5  . Multiple Vitamin (MULTIVITAMIN WITH MINERALS) TABS Take 1 tablet by mouth daily.    . pantoprazole (PROTONIX) 40 MG tablet Take 1 tablet (40 mg total) by mouth 2 (two) times daily. 60 tablet 0  . simvastatin (ZOCOR) 20 MG tablet TAKE ONE TABLET AT BEDTIME 90 tablet 0  . zolpidem (AMBIEN) 5 MG tablet TAKE ONE TABLET AT BEDTIME AS NEEDED FORSLEEP 30 tablet 0   No current facility-administered medications for this visit.     No Known Allergies  Social History   Social History  . Marital status:  Married    Spouse name: N/A  . Number of children: 4  . Years of education: 24   Occupational History  . pathologist Retired    retired   Social History Main Topics  . Smoking status: Never Smoker  . Smokeless tobacco: Never Used  . Alcohol use No     Comment: member of AA  . Drug use: No  . Sexual activity: Not on file   Other Topics Concern  . 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  Remains independent. ACP- not discussed, will approach at next OV     Family History  Problem Relation Age of Onset  . Cancer Father 77    lung cancer  . Heart disease Mother   . Heart disease Sister     Review of Systems:  As stated in the HPI and otherwise negative.   BP (!) 144/62 (BP Location: Right Arm, Patient Position: Sitting, Cuff Size: Normal)   Pulse 88   Ht 5\' 8"  (1.727 m)   Wt 165 lb (74.8 kg)   SpO2 90%   BMI 25.09 kg/m   Physical Examination: General: Well developed, well nourished, NAD  HEENT: OP clear, mucus membranes moist  SKIN: warm, dry. No rashes. Neuro: No focal deficits  Musculoskeletal: Muscle strength 5/5 all ext  Psychiatric: Mood and affect normal  Neck: No JVD, no carotid bruits, no thyromegaly, no lymphadenopathy.  Lungs: Basilar crackles bilaterally. No wheezes, rhonci, crackles Cardiovascular: Regular rate and rhythm. No murmurs, gallops or rubs. Abdomen:Soft. Bowel sounds present. Non-tender.  Extremities: 2+ bilateral lower extremity edema. Pulses are 2 + in the bilateral DP/PT.  Echo 05/01/16: Left ventricle: The cavity size was normal. There was moderate   concentric hypertrophy. Systolic function was normal. The   estimated ejection fraction was in the range of 60% to 65%. Wall   motion was normal; there were no regional wall motion   abnormalities. Doppler parameters are consistent with abnormal   left ventricular relaxation (grade 1 diastolic dysfunction).   Doppler parameters are consistent with elevated ventricular   end-diastolic filling pressure. - Aortic valve: There was mild stenosis. There was moderate   regurgitation. Mean gradient (S): 14 mm Hg. Peak gradient (S): 31   mm Hg. Valve area (VTI): 2.78 cm^2. Valve area (Vmax): 2.69 cm^2.   Valve area (Vmean): 2.49 cm^2. - Aortic root: The aortic root was normal in size. - Mitral valve: Structurally normal valve. There was no   regurgitation. - Left atrium: The atrium was moderately dilated. - Right ventricle: The cavity size was normal.  Wall thickness was   normal. Systolic function was normal. - Right atrium: The atrium was normal in size. - Tricuspid valve: There was moderate regurgitation. - Pulmonic valve: There was mild regurgitation. - Pulmonary arteries: Systolic pressure was mildly increased. PA   peak pressure: 43 mm Hg (S). - Inferior vena cava: The vessel was normal in size. - Pericardium, extracardiac: There was no pericardial effusion.   EKG:  EKG is not ordered today. The ekg ordered today demonstrates   Recent Labs: 07/22/2015: Pro B Natriuretic peptide (BNP) 61.0 10/23/2015: TSH 0.74 04/13/2016: B Natriuretic Peptide 107.2 04/30/2016: ALT 15 05/28/2016: BUN 39; Creatinine, Ser 1.72; Hemoglobin 11.6; Platelets 234.0; Potassium 5.8; Sodium 138   Lipid Panel    Component Value Date/Time   CHOL 93 04/30/2016 0422   TRIG 129 04/30/2016 0422   HDL 32 (L) 04/30/2016 0422  CHOLHDL 2.9 04/30/2016 0422   VLDL 26 04/30/2016 0422   LDLCALC 35 04/30/2016 0422     Wt Readings from Last 3 Encounters:  06/03/16 165 lb (74.8 kg)  05/28/16 158 lb (71.7 kg)  05/01/16 147 lb 8 oz (66.9 kg)     Other studies Reviewed: Additional studies/ records that were reviewed today include: . Review of the above records demonstrates:    Assessment and Plan:   1. Hypertrophic cardiomyopathy: No symptoms. Echo as above September 2016 with basal septal hypertrophy but with no physiology of HOCM  2. HTN: BP controlled. No changes.   3. Chronic diastolic CHF: Still with 2+ edema. His weight is up but he has had improved nutrition over the last few months. He has not worn compression stockings as advised. Will have him take Lasix 40 BID every other day alternating with 40 mg daily. Elevate legs during day. Compression stockings.   4. Hyperlipidemia: He is on a statin. LDL is controlled.    Current medicines are reviewed at length with the patient today.  The patient does not have concerns regarding medicines.  The  following changes have been made:  no change  Labs/ tests ordered today include:  No orders of the defined types were placed in this encounter.   Disposition:   FU with me in 6 months  Signed, Lauree [name], MD 06/03/2016 4:27 PM    [facility] [address], [place], [place]  [address] Phone: [phone]; Fax: [phone]

## 2016-06-12 ENCOUNTER — Other Ambulatory Visit: Payer: Self-pay

## 2016-06-12 ENCOUNTER — Emergency Department (HOSPITAL_COMMUNITY): Payer: Medicare Other

## 2016-06-12 ENCOUNTER — Encounter (HOSPITAL_COMMUNITY): Payer: Self-pay | Admitting: Emergency Medicine

## 2016-06-12 ENCOUNTER — Inpatient Hospital Stay (HOSPITAL_COMMUNITY)
Admission: EM | Admit: 2016-06-12 | Discharge: 2016-06-15 | DRG: 280 | Disposition: A | Discharge status: Home / Self Care | Payer: Medicare Other | Attending: Internal Medicine | Admitting: Internal Medicine

## 2016-06-12 DIAGNOSIS — K219 Gastro-esophageal reflux disease without esophagitis: Secondary | ICD-10-CM | POA: Diagnosis present

## 2016-06-12 DIAGNOSIS — R0789 Other chest pain: Secondary | ICD-10-CM

## 2016-06-12 DIAGNOSIS — R079 Chest pain, unspecified: Secondary | ICD-10-CM | POA: Diagnosis not present

## 2016-06-12 DIAGNOSIS — Z85820 Personal history of malignant melanoma of skin: Secondary | ICD-10-CM

## 2016-06-12 DIAGNOSIS — I10 Essential (primary) hypertension: Secondary | ICD-10-CM

## 2016-06-12 DIAGNOSIS — R778 Other specified abnormalities of plasma proteins: Secondary | ICD-10-CM | POA: Diagnosis present

## 2016-06-12 DIAGNOSIS — E785 Hyperlipidemia, unspecified: Secondary | ICD-10-CM | POA: Diagnosis present

## 2016-06-12 DIAGNOSIS — I252 Old myocardial infarction: Secondary | ICD-10-CM

## 2016-06-12 DIAGNOSIS — I350 Nonrheumatic aortic (valve) stenosis: Secondary | ICD-10-CM | POA: Diagnosis present

## 2016-06-12 DIAGNOSIS — I5033 Acute on chronic diastolic (congestive) heart failure: Secondary | ICD-10-CM | POA: Diagnosis present

## 2016-06-12 DIAGNOSIS — I5041 Acute combined systolic (congestive) and diastolic (congestive) heart failure: Secondary | ICD-10-CM

## 2016-06-12 DIAGNOSIS — I214 Non-ST elevation (NSTEMI) myocardial infarction: Principal | ICD-10-CM | POA: Diagnosis present

## 2016-06-12 DIAGNOSIS — I878 Other specified disorders of veins: Secondary | ICD-10-CM | POA: Diagnosis present

## 2016-06-12 DIAGNOSIS — I421 Obstructive hypertrophic cardiomyopathy: Secondary | ICD-10-CM | POA: Diagnosis not present

## 2016-06-12 DIAGNOSIS — I130 Hypertensive heart and chronic kidney disease with heart failure and stage 1 through stage 4 chronic kidney disease, or unspecified chronic kidney disease: Secondary | ICD-10-CM | POA: Diagnosis present

## 2016-06-12 DIAGNOSIS — Z79899 Other long term (current) drug therapy: Secondary | ICD-10-CM

## 2016-06-12 DIAGNOSIS — K7030 Alcoholic cirrhosis of liver without ascites: Secondary | ICD-10-CM | POA: Diagnosis present

## 2016-06-12 DIAGNOSIS — Z8249 Family history of ischemic heart disease and other diseases of the circulatory system: Secondary | ICD-10-CM

## 2016-06-12 DIAGNOSIS — N183 Chronic kidney disease, stage 3 (moderate): Secondary | ICD-10-CM | POA: Diagnosis present

## 2016-06-12 DIAGNOSIS — R7989 Other specified abnormal findings of blood chemistry: Secondary | ICD-10-CM

## 2016-06-12 DIAGNOSIS — F1010 Alcohol abuse, uncomplicated: Secondary | ICD-10-CM

## 2016-06-12 DIAGNOSIS — E875 Hyperkalemia: Secondary | ICD-10-CM | POA: Diagnosis present

## 2016-06-12 DIAGNOSIS — D631 Anemia in chronic kidney disease: Secondary | ICD-10-CM | POA: Diagnosis present

## 2016-06-12 DIAGNOSIS — I422 Other hypertrophic cardiomyopathy: Secondary | ICD-10-CM | POA: Diagnosis present

## 2016-06-12 DIAGNOSIS — K766 Portal hypertension: Secondary | ICD-10-CM | POA: Diagnosis present

## 2016-06-12 DIAGNOSIS — K3189 Other diseases of stomach and duodenum: Secondary | ICD-10-CM | POA: Diagnosis present

## 2016-06-12 DIAGNOSIS — I4510 Unspecified right bundle-branch block: Secondary | ICD-10-CM | POA: Diagnosis present

## 2016-06-12 LAB — CBC WITH DIFFERENTIAL/PLATELET
Basophils Absolute: 0 10*3/uL (ref 0.0–0.1)
Basophils Relative: 0 %
EOS ABS: 0.7 10*3/uL (ref 0.0–0.7)
EOS PCT: 8 %
HCT: 33.3 % — ABNORMAL LOW (ref 39.0–52.0)
Hemoglobin: 10.3 g/dL — ABNORMAL LOW (ref 13.0–17.0)
LYMPHS ABS: 0.7 10*3/uL (ref 0.7–4.0)
Lymphocytes Relative: 8 %
MCH: 30.8 pg (ref 26.0–34.0)
MCHC: 30.9 g/dL (ref 30.0–36.0)
MCV: 99.7 fL (ref 78.0–100.0)
MONOS PCT: 14 %
Monocytes Absolute: 1.2 10*3/uL — ABNORMAL HIGH (ref 0.1–1.0)
Neutro Abs: 5.8 10*3/uL (ref 1.7–7.7)
Neutrophils Relative %: 70 %
PLATELETS: 229 10*3/uL (ref 150–400)
RBC: 3.34 MIL/uL — ABNORMAL LOW (ref 4.22–5.81)
RDW: 15.6 % — ABNORMAL HIGH (ref 11.5–15.5)
WBC: 8.3 10*3/uL (ref 4.0–10.5)

## 2016-06-12 LAB — BASIC METABOLIC PANEL
ANION GAP: 6 (ref 5–15)
BUN: 39 mg/dL — ABNORMAL HIGH (ref 6–20)
CHLORIDE: 115 mmol/L — AB (ref 101–111)
CO2: 19 mmol/L — AB (ref 22–32)
Calcium: 8.3 mg/dL — ABNORMAL LOW (ref 8.9–10.3)
Creatinine, Ser: 1.83 mg/dL — ABNORMAL HIGH (ref 0.61–1.24)
GFR calc non Af Amer: 32 mL/min — ABNORMAL LOW (ref >60)
GFR, EST AFRICAN AMERICAN: 37 mL/min — AB (ref >60)
Glucose, Bld: 97 mg/dL (ref 65–99)
POTASSIUM: 5 mmol/L (ref 3.5–5.1)
Sodium: 140 mmol/L (ref 135–145)

## 2016-06-12 LAB — COMPREHENSIVE METABOLIC PANEL
ALT: 15 U/L — ABNORMAL LOW (ref 17–63)
ANION GAP: 9 (ref 5–15)
AST: 24 U/L (ref 15–41)
Albumin: 3 g/dL — ABNORMAL LOW (ref 3.5–5.0)
Alkaline Phosphatase: 85 U/L (ref 38–126)
BUN: 41 mg/dL — AB (ref 6–20)
CALCIUM: 8.5 mg/dL — AB (ref 8.9–10.3)
CHLORIDE: 113 mmol/L — AB (ref 101–111)
CO2: 19 mmol/L — ABNORMAL LOW (ref 22–32)
CREATININE: 1.88 mg/dL — AB (ref 0.61–1.24)
GFR calc non Af Amer: 31 mL/min — ABNORMAL LOW (ref >60)
GFR, EST AFRICAN AMERICAN: 36 mL/min — AB (ref >60)
GLUCOSE: 104 mg/dL — AB (ref 65–99)
POTASSIUM: 5.2 mmol/L — AB (ref 3.5–5.1)
SODIUM: 141 mmol/L (ref 135–145)
TOTAL PROTEIN: 6 g/dL — AB (ref 6.5–8.1)
Total Bilirubin: 0.6 mg/dL (ref 0.3–1.2)

## 2016-06-12 LAB — TROPONIN I
Troponin I: <0.03 ng/mL (ref <0.03)
Troponin I: 0.05 ng/mL (ref <0.03)
Troponin I: 0.42 ng/mL (ref <0.03)
Troponin I: 0.81 ng/mL (ref <0.03)

## 2016-06-12 LAB — BRAIN NATRIURETIC PEPTIDE: B Natriuretic Peptide: 637.8 pg/mL — ABNORMAL HIGH (ref 0.0–100.0)

## 2016-06-12 LAB — PROTIME-INR
INR: 1.03
PROTHROMBIN TIME: 13.5 s (ref 11.4–15.2)

## 2016-06-12 MED ORDER — TRAZODONE HCL 50 MG PO TABS
50.0000 mg | ORAL_TABLET | Freq: Every evening | ORAL | Status: DC | PRN
  Administered 2016-06-13 – 2016-06-15 (×2): 50 mg via ORAL
  Filled 2016-06-12 (×2): qty 1

## 2016-06-12 MED ORDER — FUROSEMIDE 40 MG PO TABS: 40.0000 mg | ORAL_TABLET | Freq: Every day | ORAL | Status: DC

## 2016-06-12 MED ORDER — OXYCODONE HCL 5 MG PO TABS
5.0000 mg | ORAL_TABLET | ORAL | Status: DC | PRN
  Filled 2016-06-12: qty 1

## 2016-06-12 MED ORDER — ONDANSETRON HCL 4 MG PO TABS: 4.0000 mg | ORAL_TABLET | Freq: Four times a day (QID) | ORAL | Status: DC | PRN

## 2016-06-12 MED ORDER — ACETAMINOPHEN 325 MG PO TABS
650.0000 mg | ORAL_TABLET | Freq: Four times a day (QID) | ORAL | Status: DC | PRN
  Administered 2016-06-14 – 2016-06-15 (×3): 650 mg via ORAL
  Filled 2016-06-12 (×5): qty 2

## 2016-06-12 MED ORDER — SODIUM POLYSTYRENE SULFONATE 15 GM/60ML PO SUSP: 15.0000 g | Freq: Once | ORAL | Status: DC

## 2016-06-12 MED ORDER — POLYETHYLENE GLYCOL 3350 17 G PO PACK: 17.0000 g | PACK | Freq: Every day | ORAL | Status: DC | PRN

## 2016-06-12 MED ORDER — ALBUTEROL SULFATE (2.5 MG/3ML) 0.083% IN NEBU: 2.5000 mg | INHALATION_SOLUTION | RESPIRATORY_TRACT | Status: DC | PRN

## 2016-06-12 MED ORDER — SODIUM CHLORIDE 0.9% FLUSH: 3.0000 mL | INTRAVENOUS | Status: DC | PRN

## 2016-06-12 MED ORDER — FOLIC ACID 1 MG PO TABS
1.0000 mg | ORAL_TABLET | Freq: Every day | ORAL | Status: DC
  Administered 2016-06-13 – 2016-06-15 (×3): 1 mg via ORAL
  Filled 2016-06-12 (×3): qty 1

## 2016-06-12 MED ORDER — ALPRAZOLAM 0.25 MG PO TABS: 0.2500 mg | ORAL_TABLET | Freq: Every evening | ORAL | Status: DC | PRN

## 2016-06-12 MED ORDER — SODIUM CHLORIDE 0.9% FLUSH
3.0000 mL | Freq: Two times a day (BID) | INTRAVENOUS | Status: DC
  Administered 2016-06-12 – 2016-06-15 (×5): 3 mL via INTRAVENOUS

## 2016-06-12 MED ORDER — ACETAMINOPHEN 650 MG RE SUPP: 650.0000 mg | Freq: Four times a day (QID) | RECTAL | Status: DC | PRN

## 2016-06-12 MED ORDER — FUROSEMIDE 10 MG/ML IJ SOLN
40.0000 mg | Freq: Two times a day (BID) | INTRAMUSCULAR | Status: DC
  Administered 2016-06-12 – 2016-06-13 (×3): 40 mg via INTRAVENOUS
  Filled 2016-06-12 (×3): qty 4

## 2016-06-12 MED ORDER — SIMVASTATIN 20 MG PO TABS
20.0000 mg | ORAL_TABLET | Freq: Every day | ORAL | Status: DC
  Administered 2016-06-12 – 2016-06-14 (×3): 20 mg via ORAL
  Filled 2016-06-12 (×3): qty 1

## 2016-06-12 MED ORDER — ONDANSETRON HCL 4 MG/2ML IJ SOLN: 4.0000 mg | Freq: Four times a day (QID) | INTRAMUSCULAR | Status: DC | PRN

## 2016-06-12 MED ORDER — ASPIRIN EC 81 MG PO TBEC
81.0000 mg | DELAYED_RELEASE_TABLET | Freq: Every day | ORAL | Status: DC
  Administered 2016-06-13 – 2016-06-15 (×3): 81 mg via ORAL
  Filled 2016-06-12 (×3): qty 1

## 2016-06-12 MED ORDER — ADULT MULTIVITAMIN W/MINERALS CH: 1.0000 | ORAL_TABLET | Freq: Every day | ORAL | Status: DC

## 2016-06-12 MED ORDER — SENNA 8.6 MG PO TABS
1.0000 | ORAL_TABLET | Freq: Two times a day (BID) | ORAL | Status: DC
  Administered 2016-06-13 – 2016-06-15 (×2): 8.6 mg via ORAL
  Filled 2016-06-12 (×5): qty 1

## 2016-06-12 MED ORDER — METOPROLOL SUCCINATE ER 50 MG PO TB24
50.0000 mg | ORAL_TABLET | Freq: Every day | ORAL | Status: DC
  Administered 2016-06-13: 50 mg via ORAL
  Filled 2016-06-12: qty 1

## 2016-06-12 MED ORDER — ISOSORBIDE MONONITRATE ER 30 MG PO TB24
30.0000 mg | ORAL_TABLET | Freq: Every day | ORAL | Status: DC
  Administered 2016-06-13 – 2016-06-15 (×3): 30 mg via ORAL
  Filled 2016-06-12 (×3): qty 1

## 2016-06-12 MED ORDER — ADULT MULTIVITAMIN W/MINERALS CH
1.0000 | ORAL_TABLET | Freq: Every day | ORAL | Status: DC
  Administered 2016-06-13 – 2016-06-15 (×3): 1 via ORAL
  Filled 2016-06-12 (×2): qty 1

## 2016-06-12 MED ORDER — PANTOPRAZOLE SODIUM 40 MG PO TBEC
40.0000 mg | DELAYED_RELEASE_TABLET | Freq: Two times a day (BID) | ORAL | Status: DC
  Administered 2016-06-12 – 2016-06-15 (×6): 40 mg via ORAL
  Filled 2016-06-12 (×6): qty 1

## 2016-06-12 MED ORDER — LORAZEPAM 2 MG/ML IJ SOLN: 1.0000 mg | Freq: Four times a day (QID) | INTRAMUSCULAR | Status: AC | PRN

## 2016-06-12 MED ORDER — ZOLPIDEM TARTRATE 5 MG PO TABS: 5.0000 mg | ORAL_TABLET | Freq: Every evening | ORAL | Status: DC | PRN

## 2016-06-12 MED ORDER — SODIUM CHLORIDE 0.9 % IV SOLN: 250.0000 mL | INTRAVENOUS | Status: DC | PRN

## 2016-06-12 MED ORDER — FUROSEMIDE 40 MG PO TABS
40.0000 mg | ORAL_TABLET | Freq: Every day | ORAL | Status: DC
  Administered 2016-06-13 – 2016-06-15 (×3): 40 mg via ORAL
  Filled 2016-06-12 (×3): qty 1

## 2016-06-12 MED ORDER — LORAZEPAM 1 MG PO TABS: 1.0000 mg | ORAL_TABLET | Freq: Four times a day (QID) | ORAL | Status: AC | PRN

## 2016-06-12 MED ORDER — VITAMIN B-1 100 MG PO TABS
100.0000 mg | ORAL_TABLET | Freq: Every day | ORAL | Status: DC
  Administered 2016-06-13 – 2016-06-15 (×3): 100 mg via ORAL
  Filled 2016-06-12 (×3): qty 1

## 2016-06-12 MED ORDER — AMLODIPINE BESYLATE 10 MG PO TABS
10.0000 mg | ORAL_TABLET | Freq: Every day | ORAL | Status: DC
  Administered 2016-06-13 – 2016-06-15 (×3): 10 mg via ORAL
  Filled 2016-06-12 (×3): qty 1

## 2016-06-12 NOTE — ED Notes (Signed)
Placed pt onto bedside toilet, pt tolerated well.

## 2016-06-12 NOTE — ED Provider Notes (Signed)
[facility] DEPT Provider Note   CSN: [id number] Arrival date & time: 06/12/16  [id number]  First Provider Contact:  First MD Initiated Contact with Patient 06/12/16 0545        History   Chief Complaint Chief Complaint  Patient presents with  . Chest Pain  . Shortness of Breath    HPI [name] is a 82 y.o. male.  Patient presents from home with left-sided chest pain and shortness of breath that woke him from sleep. Pain within the left side of his chest radiating to his left shoulder and was associated with shortness of breath. Now chest pain free after about 15 minutes. EMS reports unchanged right bundle branch block on EKG. Patient not given aspirin due to GI bleed history. Denies any further pain. States he never had this kind of pain before. Is not certain when his last stress test was. History of hypertrophic cardiomyopathy but denies any history of CAD or stents. He has chronic leg swelling and redness that he states is at baseline. Denies fever, chills, nausea or vomiting. Notably he lost his wife 3 weeks ago.  Hx recurrent GI bleed with admission in June. Hx HCOM. He has never had coronary angiogram.   The history is provided by the patient and the EMS personnel.  Chest Pain   Associated symptoms include shortness of breath. Pertinent negatives include no abdominal pain, no dizziness, no fever, no headaches, no nausea, no vomiting and no weakness.  Shortness of Breath  Associated symptoms include chest pain and leg swelling. Pertinent negatives include no fever, no headaches, no rhinorrhea, no vomiting, no abdominal pain and no rash.    Past Medical History:  Diagnosis Date  . Alcoholic cirrhosis ([place])   . 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 Alcoholism ([place])   . Ascites 11/2004   "early" SBP on paracentesis.   . Chronic gastritis 05/01/2016  . CKD (chronic kidney disease), stage III 10/11/2015  . Colon polyp perhaps in 1990s   pathology/type not known  .  Diverticulosis of colon perhaps in 1990s  . Fracture of wrist 2014 spring vs early summer   refused orthopods suggestion to set the bone.   Marland Kitchen GERD (gastroesophageal reflux disease)   . HLD (hyperlipidemia)   . HTN (hypertension)   . Hypertrophic cardiomyopathy ([place])   . Portal hypertensive gastropathy 2006  . Renal insufficiency   . Rhabdomyolysis 01/2005    Patient Active Problem List   Diagnosis Date Noted  . Abnormal lung sounds 05/28/2016  . Gastritis 05/02/2016  . Elevated troponin 05/02/2016  . Demand ischemia of myocardium ([place]) 05/02/2016  . Acute blood loss anemia 05/02/2016  . Chest pain 05/02/2016  . Duodenal ulcer disease   . Melena   . Iron deficiency anemia due to chronic blood loss   . GI bleed 04/13/2016  . CKD (chronic kidney disease), stage III 10/11/2015  . Memory loss 10/04/2015  . DOE (dyspnea on exertion) 07/17/2015  . Hyperglycemia 02/23/2015  . Gross hematuria 08/23/2014  . Aortic stenosis, mild 09/13/2013  . Acute duodenal ulcer with hemorrhage, without mention of obstruction 08/04/2013  . Malignant melanoma of lower extremity or hip, left 03/17/2012  . Routine health maintenance 03/17/2012  . Alcoholic cirrhosis ([place]) 123456  . Portal hypertensive gastropathy 05/26/2011  . Edema 10/10/2010  . ALCOHOL ABUSE 09/05/2009  . Hyperlipidemia 06/23/2009  . HYPERTENSION, BENIGN 06/23/2009  . Hypertrophic obstructive cardiomyopathy ([place]) 06/23/2009    Past Surgical History:  Procedure Laterality Date  . BASAL  CELL CARCINOMA EXCISION Left 11/2012   lower eyelid  . ESOPHAGOGASTRODUODENOSCOPY N/A 08/04/2013   Procedure: ESOPHAGOGASTRODUODENOSCOPY (EGD);  Surgeon: Ladene Artist, MD;  Location: [facility] ENDOSCOPY;  Service: Endoscopy;  Laterality: N/A;  . ESOPHAGOGASTRODUODENOSCOPY (EGD) WITH PROPOFOL N/A 05/01/2016   Procedure: ESOPHAGOGASTRODUODENOSCOPY (EGD) WITH PROPOFOL;  Surgeon: Jerene Bears, MD;  Location: [facility] ENDOSCOPY;  Service: Endoscopy;  Laterality:  N/A;  . INGUINAL HERNIA REPAIR    . MELANOMA EXCISION Left 02/2011   malignant melanoma of thigh       Home Medications    Prior to Admission medications   Medication Sig Start Date End Date Taking? Authorizing Provider  acetaminophen (TYLENOL) 325 MG tablet Take 650 mg by mouth every 6 (six) hours as needed for pain.    Historical Provider, MD  ALPRAZolam Duanne Moron) 0.25 MG tablet Take 1 tablet (0.25 mg total) by mouth at bedtime as needed for anxiety. 05/19/16   Biagio Borg, MD  amLODipine (NORVASC) 10 MG tablet Take 1 tablet (10 mg total) by mouth daily. 01/27/16   Burnell Blanks, MD  furosemide (LASIX) 40 MG tablet Take 1 tablet (40 mg total) by mouth daily. 05/25/16   Biagio Borg, MD  isosorbide mononitrate (IMDUR) 30 MG 24 hr tablet TAKE ONE TABLET EACH DAY 06/02/16   Biagio Borg, MD  metoprolol succinate (TOPROL-XL) 100 MG 24 hr tablet TAKE ONE AND ONE-HALF TABLETS DAILY [facility] IMMEDIATELY FOLLOWING A MEAL 12/23/15   Biagio Borg, MD  Multiple Vitamin (MULTIVITAMIN WITH MINERALS) TABS Take 1 tablet by mouth daily.    Historical Provider, MD  pantoprazole (PROTONIX) 40 MG tablet Take 1 tablet (40 mg total) by mouth 2 (two) times daily. 05/02/16   Clanford Marisa Hua, MD  simvastatin (ZOCOR) 20 MG tablet TAKE ONE TABLET AT BEDTIME 03/06/16   Biagio Borg, MD  zolpidem (AMBIEN) 5 MG tablet TAKE ONE TABLET AT BEDTIME AS NEEDED FORSLEEP 05/25/16   [place]en Circle, FNP    Family History Family History  Problem Relation Age of Onset  . Cancer Father 31    lung cancer  . Heart disease Mother   . Heart disease Sister     Social History Social History  Substance Use Topics  . Smoking status: Never Smoker  . Smokeless tobacco: Never Used  . Alcohol use No     Comment: member of AA     Allergies   Review of patient's allergies indicates no known allergies.   Review of Systems Review of Systems  Constitutional: Negative for activity change, appetite change and fever.    HENT: Negative for congestion and rhinorrhea.   Respiratory: Positive for chest tightness and shortness of breath.   Cardiovascular: Positive for chest pain and leg swelling.  Gastrointestinal: Negative for abdominal pain, nausea and vomiting.  Genitourinary: Negative for urgency.  Musculoskeletal: Negative for arthralgias and myalgias.  Skin: Negative for rash.  Neurological: Negative for dizziness, weakness, light-headedness and headaches.   A complete 10 system review of systems was obtained and all systems are negative except as noted in the HPI and PMH.   Physical Exam Updated Vital Signs BP 138/67 (BP Location: Right Arm)   Pulse 88   Temp 97.6 F (36.4 C) (Oral)   Resp 20   Ht 5\' 8"  (1.727 m)   Wt 162 lb (73.5 kg)   SpO2 94%   BMI 24.63 kg/m   Physical Exam  Constitutional: He is oriented to person, place, and time. He appears  well-developed and well-nourished. No distress.  HENT:  Head: Normocephalic and atraumatic.  Mouth/Throat: Oropharynx is clear and moist. No oropharyngeal exudate.  Eyes: Conjunctivae and EOM are normal. Pupils are equal, round, and reactive to light.  Neck: Normal range of motion. Neck supple.  No meningismus.  Cardiovascular: Normal rate, regular rhythm and intact distal pulses.   Murmur heard. 3/6 holosystolic murmur  Pulmonary/Chest: Effort normal and breath sounds normal. No respiratory distress.  Abdominal: Soft. There is no tenderness. There is no rebound and no guarding.  Musculoskeletal: Normal range of motion. He exhibits tenderness.  +3 pitting edema to mid thigh bilaterally with clear drainage and erythema. Intact distal pulses.  Neurological: He is alert and oriented to person, place, and time. No cranial nerve deficit. He exhibits normal muscle tone. Coordination normal.  No ataxia on finger to nose bilaterally. No pronator drift. 5/5 strength throughout. CN 2-12 intact.Equal grip strength. Sensation intact.   Skin: Skin is warm.   Psychiatric: He has a normal mood and affect. His behavior is normal.  Nursing note and vitals reviewed.    ED Treatments / Results  Labs (all labs ordered are listed, but only abnormal results are displayed) Labs Reviewed  CBC WITH DIFFERENTIAL/PLATELET - Abnormal; Notable for the following:       Result Value   RBC 3.34 (*)    Hemoglobin 10.3 (*)    HCT 33.3 (*)    RDW 15.6 (*)    Monocytes Absolute 1.2 (*)    All other components within normal limits  COMPREHENSIVE METABOLIC PANEL - Abnormal; Notable for the following:    Potassium 5.2 (*)    Chloride 113 (*)    CO2 19 (*)    Glucose, Bld 104 (*)    BUN 41 (*)    Creatinine, Ser 1.88 (*)    Calcium 8.5 (*)    Total Protein 6.0 (*)    Albumin 3.0 (*)    ALT 15 (*)    GFR calc non Af Amer 31 (*)    GFR calc Af Amer 36 (*)    All other components within normal limits  BRAIN NATRIURETIC PEPTIDE - Abnormal; Notable for the following:    B Natriuretic Peptide 637.8 (*)    All other components within normal limits  TROPONIN I  PROTIME-INR  TROPONIN I  BASIC METABOLIC PANEL    EKG  EKG Interpretation None       Radiology Dg Chest 2 View  Result Date: 06/12/2016 CLINICAL DATA:  82 year old male with chest pain and shortness of breath EXAM: CHEST  2 VIEW COMPARISON:  Chest radiograph dated 05/28/2016 FINDINGS: Shallow inspiration. There is eventration of the right hemidiaphragm with atelectatic changes of the right lung base. Patchy area of hazy density at the left lung base may represent atelectasis versus infiltrate. There is no pneumothorax. Stable enlarged cardiac silhouette. No acute osseous pathology. IMPRESSION: Patchy left lung base airspace opacity, atelectasis versus infiltrate. Clinical correlation and follow-up recommended. Electronically Signed   By: Anner Crete M.D.   On: 06/12/2016 06:47    Procedures Procedures (including critical care time)  Medications Ordered in ED Medications - No data to  display   Initial Impression / Assessment and Plan / ED Course  I have reviewed the triage vital signs and the nursing notes.  Pertinent labs & imaging results that were available during my care of the patient were reviewed by me and considered in my medical decision making (see chart for details).  Clinical  Course  Patient from home with episode of chest pain, now resolved. EKG unchanged.  Troponin negative. Chest x-ray poor inspiration. Patient denies any cough or fever. Doubt pneumonia.  Hemoglobin 10.3, was 11 on discharge in June. EGD in June with EGD 6/23 Impression:  - Normal esophagus.  - Small hiatal hernia.  - Gastritis. Biopsied.  - Multiple non-bleeding duodenal ulcers with no   stigmata of bleeding.   Discussed with Dr. Acie Fredrickson  of cardiology. He agrees with serial troponins. Feels patient can be followed as an outpatient if he does not want to be admitted.   D/w patient.  Would prefer he be admitted for observation given his age and risk factors.  He seems unwilling.  He is agreeable to second troponin at 9 am.  Will also repeat BMP to recheck potassium. CKD at baseline.  Dr. Regenia Skeeter to assume care. Final Clinical Impressions(s) / ED Diagnoses   Final diagnoses:  None    New Prescriptions New Prescriptions   No medications on file     Ezequiel Essex, MD 06/12/16 [phone]

## 2016-06-12 NOTE — ED Notes (Signed)
Patient assisted in and out of bed to use the urinal.

## 2016-06-12 NOTE — H&P (Signed)
Patient Demographics:    [name], is a 82 y.o. male  MRN: [id number]   DOB - 1929/02/04  Admit Date - 06/12/2016  Outpatient Primary MD for the patient is [name], MD   Assessment & Plan:    Principal Problem:   Chest pain Active Problems:   Alcohol abuse   HYPERTENSION, BENIGN   Hypertrophic obstructive cardiomyopathy (HCC)   Alcoholic cirrhosis (HCC)   Elevated troponin    1)1)Atypical Chest Pain-  Cardiovascular risk factors include Age, gender, hypertension, dyslipidemia and sedentary lifestyle,   Refer to  Observation status on  telemetry monitored unit, check serial troponins and EKG for rule out acute coronary syndrome .  If patient rules out for ACS, we will consider stress in am , please get Cardiology consult, ED provider discussed this case with Dr. Cathie Olden the cardiologist on call.  Give aspirin, isosorbide, and Toprol-XL. Echocardiogram from June 2017 shows preserved EF over 60% with grade 1 diastolic dysfunction but no significant wall motion abnormalities, no recent stress test or invasive vascular workup   2History of GI bleed/Anemia- no ongoing GI bleed at this time, continue Protonix, hemoglobin stable at 10.3  3)CKD-stage III , stable, GFR is less than 40, creatinine is around his previous baseline of 1.8 , avoid nephrotoxic agents, hyperkalemia noted, we'll give 1 dose of Kayexalate  4)H/o alcohol abuse and alcoholic hepatitis/cirrhosis-stable, patient is not encephalopathic, DT precautions with lorazepam per CIWA  Protocol, thiamine and folic acid as ordered  5)HTN-restart amlodipine 10 mg daily, Toprol-XL, isosorbide and   may use IV Hydralazine 10 mg  Every 4 hours Prn for systolic blood pressure over 160 mmhg    With History of - Reviewed by me  Past Medical History:    Diagnosis Date  . Alcoholic cirrhosis ([place])   . 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 Alcoholism ([place])   . Ascites 11/2004   "early" SBP on paracentesis.   . Chronic gastritis 05/01/2016  . CKD (chronic kidney disease), stage III 10/11/2015  . Colon polyp perhaps in 1990s   pathology/type not known  . Diverticulosis of colon perhaps in 1990s  . Fracture of wrist 2014 spring vs early summer   refused orthopods suggestion to set the bone.   Marland Kitchen GERD (gastroesophageal reflux disease)   . HLD (hyperlipidemia)   . HTN (hypertension)   . Hypertrophic cardiomyopathy ([place])   . Portal hypertensive gastropathy 2006  . Renal insufficiency   . Rhabdomyolysis 01/2005      Past Surgical History:  Procedure Laterality Date  . BASAL CELL CARCINOMA EXCISION Left 11/2012   lower eyelid  . ESOPHAGOGASTRODUODENOSCOPY N/A 08/04/2013   Procedure: ESOPHAGOGASTRODUODENOSCOPY (EGD);  Surgeon: Ladene Artist, MD;  Location: [facility] ENDOSCOPY;  Service: Endoscopy;  Laterality: N/A;  . ESOPHAGOGASTRODUODENOSCOPY (EGD) WITH PROPOFOL N/A 05/01/2016   Procedure: ESOPHAGOGASTRODUODENOSCOPY (EGD) WITH PROPOFOL;  Surgeon: Jerene Bears, MD;  Location: [facility] ENDOSCOPY;  Service: Endoscopy;  Laterality: N/A;  . INGUINAL HERNIA REPAIR    . MELANOMA EXCISION Left 02/2011   malignant melanoma of thigh      Chief Complaint  Patient presents with  . Chest Pain  . Shortness of Breath      HPI:    [name]  is a 82 y.o. male, With past medical history relevant for hypertension, dyslipidemia and alcohol abuse as well as history of recent GI bleed who presents to the ED with concerns about left-sided chest pains. At the time of my valuation patient is mostly chest pain-free. Chest pain was not associated with any diaphoresis shortness of breath palpitations dizziness. No leg pains or pleuritic symptoms. Chest pain is not better with rest and is not worse with activity . No productive cough no fevers no chills. Patient  initially wanted to go home from the ED, he was later convinced to stay overnight for further cardiovascular workup as his repeat troponin is borderline. ED physician discussed case with on-call cardiologist Dr. Cathie Olden was advised admission for further workup. Patient does have chronic lower extremity edema for which takes lasix. Last alcoholic intake was last evening, patient drinks on most days. No vomiting or diarrhea no change in stool or urine color lately   Review of systems:    In addition to the HPI above,   A full 12 point Review of Systems was done, except as stated above, all other Review of Systems were negative.    Social History:  Reviewed by me    Social History  Substance Use Topics  . Smoking status: Never Smoker  . Smokeless tobacco: Never Used  . Alcohol use No     Comment: member of AA       Family History :  Reviewed by me    Family History  Problem Relation Age of Onset  . Cancer Father 4    lung cancer  . Heart disease Mother   . Heart disease Sister      Home Medications:   Prior to Admission medications   Medication Sig Start Date End Date Taking? Authorizing Provider  acetaminophen (TYLENOL) 325 MG tablet Take 650 mg by mouth every 6 (six) hours as needed for pain.   Yes Historical Provider, MD  ALPRAZolam (XANAX) 0.25 MG tablet Take 1 tablet (0.25 mg total) by mouth at bedtime as needed for anxiety. 05/19/16  Yes Biagio Borg, MD  amLODipine (NORVASC) 10 MG tablet Take 1 tablet (10 mg total) by mouth daily. 01/27/16  Yes Burnell Blanks, MD  furosemide (LASIX) 40 MG tablet Take 1 tablet (40 mg total) by mouth daily. 05/25/16  Yes Biagio Borg, MD  isosorbide mononitrate (IMDUR) 30 MG 24 hr tablet TAKE ONE TABLET EACH DAY 06/02/16  Yes Biagio Borg, MD  metoprolol succinate (TOPROL-XL) 100 MG 24 hr tablet TAKE ONE AND ONE-HALF TABLETS DAILY [facility] IMMEDIATELY FOLLOWING A MEAL 12/23/15  Yes Biagio Borg, MD  Multiple Vitamin (MULTIVITAMIN WITH  MINERALS) TABS Take 1 tablet by mouth daily.   Yes Historical Provider, MD  pantoprazole (PROTONIX) 40 MG tablet Take 1 tablet (40 mg total) by mouth 2 (two) times daily. 05/02/16  Yes Clanford Marisa Hua, MD  simvastatin (ZOCOR) 20 MG tablet TAKE ONE TABLET AT BEDTIME 03/06/16  Yes Biagio Borg, MD  zolpidem (AMBIEN) 5 MG tablet TAKE ONE TABLET AT BEDTIME AS NEEDED FORSLEEP 05/25/16  Yes Golden Circle, FNP     Allergies:    No  Known Allergies   Physical Exam:   Vitals  Blood pressure 119/61, pulse 84, temperature 97.8 F (36.6 C), temperature source Oral, resp. rate 20, height 5\' 8"  (1.727 m), weight 73.5 kg (162 lb), SpO2 98 %.  Physical Examination: General appearance - alert, well appearing, and in no distress   Mental status - alert, oriented to person, place, and time,  Eyes - sclera anicteric Neck - supple, no JVD elevation , Chest - clear  to auscultation bilaterally, symmetrical air movement,  Heart - S1 and S2 normal, 3/6 diastolic murmur Abdomen - soft, nontender, nondistended, no masses or organomegaly Neurological - screening mental status exam normal, neck supple without rigidity, cranial nerves II through XII intact, DTR's normal and symmetric Extremities -   intact peripheral pulses, extensive chronic venostasis and lower extremity edema, according to patient this is his baseline. No streaking, no draining open wounds Skin - warm, dry    Data Review:    CBC  Recent Labs Lab 06/12/16 0556  WBC 8.3  HGB 10.3*  HCT 33.3*  PLT 229  MCV 99.7  MCH 30.8  MCHC 30.9  RDW 15.6*  LYMPHSABS 0.7  MONOABS 1.2*  EOSABS 0.7  BASOSABS 0.0   ------------------------------------------------------------------------------------------------------------------  Chemistries   Recent Labs Lab 06/12/16 0556 06/12/16 0900  NA 141 140  K 5.2* 5.0  CL 113* 115*  CO2 19* 19*  GLUCOSE 104* 97  BUN 41* 39*  CREATININE 1.88* 1.83*  CALCIUM 8.5* 8.3*  AST 24  --   ALT  15*  --   ALKPHOS 85  --   BILITOT 0.6  --    ------------------------------------------------------------------------------------------------------------------ estimated creatinine clearance is 28.6 mL/min (by C-G formula based on SCr of 1.83 mg/dL). ------------------------------------------------------------------------------------------------------------------ No results for input(s): TSH, T4TOTAL, T3FREE, THYROIDAB in the last 72 hours.  Invalid input(s): FREET3   Coagulation profile  Recent Labs Lab 06/12/16 0556  INR 1.03   ------------------------------------------------------------------------------------------------------------------- No results for input(s): DDIMER in the last 72 hours. -------------------------------------------------------------------------------------------------------------------  Cardiac Enzymes  Recent Labs Lab 06/12/16 0556 06/12/16 0900  TROPONINI <0.03 0.05*   ------------------------------------------------------------------------------------------------------------------    Component Value Date/Time   BNP 637.8 (H) 06/12/2016 0556     ---------------------------------------------------------------------------------------------------------------  Urinalysis    Component Value Date/Time   COLORURINE YELLOW 04/13/2016 1154   APPEARANCEUR CLEAR 04/13/2016 1154   LABSPEC 1.013 04/13/2016 1154   PHURINE 5.0 04/13/2016 1154   GLUCOSEU NEGATIVE 04/13/2016 1154   GLUCOSEU NEGATIVE 07/17/2015 1148   HGBUR NEGATIVE 04/13/2016 1154   Coldiron 04/13/2016 1154   KETONESUR NEGATIVE 04/13/2016 1154   PROTEINUR 30 (A) 04/13/2016 1154   UROBILINOGEN 0.2 07/17/2015 1148   NITRITE NEGATIVE 04/13/2016 1154   LEUKOCYTESUR NEGATIVE 04/13/2016 1154    ----------------------------------------------------------------------------------------------------------------   Imaging Results:    Dg Chest 2 View  Result Date:  06/12/2016 CLINICAL DATA:  82 year old male with chest pain and shortness of breath EXAM: CHEST  2 VIEW COMPARISON:  Chest radiograph dated 05/28/2016 FINDINGS: Shallow inspiration. There is eventration of the right hemidiaphragm with atelectatic changes of the right lung base. Patchy area of hazy density at the left lung base may represent atelectasis versus infiltrate. There is no pneumothorax. Stable enlarged cardiac silhouette. No acute osseous pathology. IMPRESSION: Patchy left lung base airspace opacity, atelectasis versus infiltrate. Clinical correlation and follow-up recommended. Electronically Signed   By: Anner Crete M.D.   On: 06/12/2016 06:47    Radiological Exams on Admission: Dg Chest 2 View  Result Date: 06/12/2016 CLINICAL DATA:  82 year old male with chest pain and shortness of breath EXAM: CHEST  2 VIEW COMPARISON:  Chest radiograph dated 05/28/2016 FINDINGS: Shallow inspiration. There is eventration of the right hemidiaphragm with atelectatic changes of the right lung base. Patchy area of hazy density at the left lung base may represent atelectasis versus infiltrate. There is no pneumothorax. Stable enlarged cardiac silhouette. No acute osseous pathology. IMPRESSION: Patchy left lung base airspace opacity, atelectasis versus infiltrate. Clinical correlation and follow-up recommended. Electronically Signed   By: Anner Crete M.D.   On: 06/12/2016 06:47    DVT Prophylaxis scd  AM Labs Ordered, also please review Full Orders  Family Communication: Admission, patients condition and plan of care including tests being ordered have been discussed with the patient who indicate understanding and agree with the plan   Code Status - Full Code  Likely DC to  Home   Condition   stable  Jamy Whyte M.D on 06/12/2016 at 11:53 AM   Between 7am to 7pm - Pager - [phone]  After 7pm go to [email or web address] - password [email or web address]  [facility] - Office  [phone]  Dragon  dictation system was used to create this note, attempts have been made to correct errors, however presence of uncorrected errors is not a reflection quality of care provided.

## 2016-06-12 NOTE — ED Triage Notes (Signed)
Pt brought to ED by [facility] from home for left side CP 4/10 pt states pain woke him up, some SOB, pt unable to get ASA by EMS due to hx of GI bleed, pt cp free on EMS arrival, EKG show some bigeminies, SR with 1 av blook, VS 146/70, HR 90, R 20 SPO2 95% on 2 L

## 2016-06-12 NOTE — ED Notes (Signed)
Patient gave verbal to talk with daughter Shamier Dombeck @ [phone]

## 2016-06-12 NOTE — ED Notes (Signed)
Report given to Josh RN

## 2016-06-12 NOTE — Consult Note (Signed)
Cardiology Consult    Patient ID: [name] MRN: [id number], DOB/AGE: 80-Feb-1930   Admit date: 06/12/2016 Date of Consult: 06/12/2016  Primary Physician: [name], MD Reason for Consult: Chest pain  Primary Cardiologist: Dr. Angelena Form  Requesting Provider: Dr. 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 Last Echo was in 07/2015 with moderate basal septal hypertrophy with no clear HOCM physiology, normal LV function. He presented to the ED on 06/12/16 with chest pain and SOB that awakened him from sleep.   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 He tells me that his pain was "very mild" and "not consistent with a ACS pattern". Pain radiated to his left shoulder, and he had some associated SOB so he called EMS. 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   Upon arrival to the ED, EKG showed NSR with RBBB, this was noted on previous EKG's. His initial troponin was negative, but repeat troponin was 0.05. He is currently chest pain free.   He does not have a history of CAD, and has never had an ischemic evaluation.   He was seen in our office just last week and was doing well. His lasix was increased as he had some 2+ edema. Of note, his wife passed away about 3 weeks ago. He denies chest pain, orthopnea and PND.   BNP is elevated at 637.8. He has +3 bilateral lower extremity edema.    Past Medical History   Past Medical History:  Diagnosis Date  . Alcoholic cirrhosis ([place])   . 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 Alcoholism ([place])   . Ascites 11/2004   "early" SBP on paracentesis.   . Chronic gastritis 05/01/2016  . CKD (chronic kidney disease), stage III 10/11/2015  . Colon polyp perhaps in 1990s   pathology/type not known  . Diverticulosis of colon perhaps in 1990s  . Fracture of  wrist 2014 spring vs early summer   refused orthopods suggestion to set the bone.   Marland Kitchen GERD (gastroesophageal reflux disease)   . HLD (hyperlipidemia)   . HTN (hypertension)   . Hypertrophic cardiomyopathy ([place])   . Portal hypertensive gastropathy 2006  . Renal insufficiency   . Rhabdomyolysis 01/2005    Past Surgical History:  Procedure Laterality Date  . BASAL CELL CARCINOMA EXCISION Left 11/2012   lower eyelid  . ESOPHAGOGASTRODUODENOSCOPY N/A 08/04/2013   Procedure: ESOPHAGOGASTRODUODENOSCOPY (EGD);  Surgeon: Ladene Artist, MD;  Location: [facility] ENDOSCOPY;  Service: Endoscopy;  Laterality: N/A;  . ESOPHAGOGASTRODUODENOSCOPY (EGD) WITH PROPOFOL N/A 05/01/2016   Procedure: ESOPHAGOGASTRODUODENOSCOPY (EGD) WITH PROPOFOL;  Surgeon: Jerene Bears, MD;  Location: [facility] ENDOSCOPY;  Service: Endoscopy;  Laterality: N/A;  . INGUINAL HERNIA REPAIR    . MELANOMA EXCISION Left 02/2011   malignant melanoma of thigh     Allergies  No Known Allergies  Inpatient Medications    . multivitamin with minerals  1 tablet Oral Daily  . sodium polystyrene  15 g Oral Once    Family History    Family History  Problem Relation Age of Onset  . Cancer Father 39    lung cancer  . Heart disease Mother   . Heart disease Sister     Social History    Social History   Social  History  . Marital status: Married    Spouse name: N/A  . Number of children: 4  . Years of education: 24   Occupational History  . pathologist Retired    retired   Social History Main Topics  . Smoking status: Never Smoker  . Smokeless tobacco: Never Used  . Alcohol use No     Comment: member of AA  . Drug use: No  . Sexual activity: Not on file   Other Topics Concern  . 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  Remains independent. ACP- not discussed, will approach at next OV     Review of Systems    General:  No chills, fever, night sweats or weight changes.    Cardiovascular:  No chest pain, dyspnea on exertion, + edema, orthopnea, palpitations, paroxysmal nocturnal dyspnea. Dermatological: No rash, lesions/masses Respiratory: No cough, dyspnea Urologic: No hematuria, dysuria Abdominal:   No nausea, vomiting, diarrhea, bright red blood per rectum, melena, or hematemesis Neurologic:  No visual changes, wkns, changes in mental status. All other systems reviewed and are otherwise negative except as noted above.  Physical Exam    Blood pressure 133/65, pulse 89, temperature 97.8 F (36.6 C), temperature source Oral, resp. rate 20, height 5\' 8"  (1.727 m), weight 162 lb (73.5 kg), SpO2 99 %.  General: Pleasant, NAD Psych: Normal affect. Neuro: Alert and oriented X 3. Moves all extremities spontaneously. HEENT: Normal  Neck: Supple without bruits or JVD. Lungs:  Resp regular and unlabored, fine crackles in bases.  Heart: RRR no s3, s4, murmurs. Abdomen: Soft, non-tender, non-distended, BS + x 4.  Extremities: No clubbing, cyanosis 3+ pretibial edema. DP/PT/Radials 2+ and equal bilaterally.  Labs     Recent Labs  06/12/16 0556 06/12/16 0900  TROPONINI <0.03 0.05*   Lab Results  Component Value Date   WBC 8.3 06/12/2016   HGB 10.3 (L) 06/12/2016   HCT 33.3 (L) 06/12/2016   MCV 99.7 06/12/2016   PLT 229 06/12/2016    Recent Labs Lab 06/12/16 0556 06/12/16 0900  NA 141 140  K 5.2* 5.0  CL 113* 115*  CO2 19* 19*  BUN 41* 39*  CREATININE 1.88* 1.83*  CALCIUM 8.5* 8.3*  PROT 6.0*  --   BILITOT 0.6  --   ALKPHOS 85  --   ALT 15*  --   AST 24  --   GLUCOSE 104* 97   Lab Results  Component Value Date   CHOL 93 04/30/2016   HDL 32 (L) 04/30/2016   LDLCALC 35 04/30/2016   TRIG 129 04/30/2016   No results found for: [facility]   Radiology Studies    Dg Chest 2 View  Result Date: 06/12/2016 CLINICAL DATA:  82 year old male with chest pain and shortness of breath EXAM: CHEST  2 VIEW COMPARISON:  Chest radiograph dated  05/28/2016 FINDINGS: Shallow inspiration. There is eventration of the right hemidiaphragm with atelectatic changes of the right lung base. Patchy area of hazy density at the left lung base may represent atelectasis versus infiltrate. There is no pneumothorax. Stable enlarged cardiac silhouette. No acute osseous pathology. IMPRESSION: Patchy left lung base airspace opacity, atelectasis versus infiltrate. Clinical correlation and follow-up recommended. Electronically Signed   By: Anner Crete M.D.   On: 06/12/2016 06:47   Dg Chest 2 View  Result Date: 05/29/2016 CLINICAL DATA:  82 year old male with history of crackles in the left lung on physical examination.  EXAM: CHEST  2 VIEW COMPARISON:  Chest x-ray 04/29/2016. FINDINGS: Chronic elevation of the right hemidiaphragm. Low lung volumes. Some bibasilar linear opacities are noted, similar to the prior study, favored to represent areas of mild chronic scarring and/or subsegmental atelectasis. No definite consolidative airspace disease. No pleural effusions. No evidence of pulmonary edema. Heart size is normal. Upper mediastinal contours are within normal limits. Atherosclerosis in the thoracic aorta. IMPRESSION: 1. Low lung volumes with mild bibasilar subsegmental atelectasis and/or scarring. No radiographic evidence of acute cardiopulmonary disease. 2. Chronic elevation of the right hemidiaphragm is unchanged. 3. Aortic atherosclerosis. Electronically Signed   By: Vinnie Langton M.D.   On: 05/29/2016 07:09    EKG & Cardiac Imaging    EKG: NSR, RBBB  Echocardiogram: 05/01/16 Study Conclusions  - Left ventricle: The cavity size was normal. There was moderate   concentric hypertrophy. Systolic function was normal. The   estimated ejection fraction was in the range of 60% to 65%. Wall   motion was normal; there were no regional wall motion   abnormalities. Doppler parameters are consistent with abnormal   left ventricular relaxation (grade 1  diastolic dysfunction).   Doppler parameters are consistent with elevated ventricular   end-diastolic filling pressure. - Aortic valve: There was mild stenosis. There was moderate   regurgitation. Mean gradient (S): 14 mm Hg. Peak gradient (S): 31   mm Hg. Valve area (VTI): 2.78 cm^2. Valve area (Vmax): 2.69 cm^2.   Valve area (Vmean): 2.49 cm^2. - Aortic root: The aortic root was normal in size. - Mitral valve: Structurally normal valve. There was no   regurgitation. - Left atrium: The atrium was moderately dilated. - Right ventricle: The cavity size was normal. Wall thickness was   normal. Systolic function was normal. - Right atrium: The atrium was normal in size. - Tricuspid valve: There was moderate regurgitation. - Pulmonic valve: There was mild regurgitation. - Pulmonary arteries: Systolic pressure was mildly increased. PA   peak pressure: 43 mm Hg (S). - Inferior vena cava: The vessel was normal in size. - Pericardium, extracardiac: There was no pericardial effusion.  Assessment & Plan   1. Chest pain: low suspicion for ACS. He tells me his pain was very mild and transient. We will continue to follow troponin. Could do Lexiscan Myoview in the am just to be sure that the cause of his pain is not ischemic.   Continue beta blocker and  30mg  isosorbide daily.   2. Volume overload/diastolic CHF: has marked bilateral lower extremity edema. Skin has woody appearance indicative of chronic edema and venous insufficiency. BNP elevated. Would diurese with 40mg  IV lasix BID. Creatinine noted, will follow.   3. HLD: his last LDL is well under goal at 35. Continue Crestor.    Signed, Arbutus Leas, NP 06/12/2016, 12:51 PM Pager: [phone]   Patient seen and examined. Agree with assessment and plan. 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 Dot Been, Dr. Addison Lank, and most recently has been seen by Dr. Julianne Handler.  He was last  seen by him on 06/03/2016 of which he did not remember.  He has a past history suggestive of HOCM, but he was not found to have LVOT obstruction on his most recent echo in 04/2016 and had mild AS with a mean gradient of 14 and peak gradient of 31.  Had normal systolic function with an EF of 60-65%.  There was moderate LA dilatation, moderate TR,  mild PR, and mild pulmonary hypertension with estimated PA peak pressure at 43 mm Hg.  He has a history of hypertension, hyperlipidemia, and previous EtOH abuse.  He resides at [facility].  This a.m. he developed left-sided chest shoulder discomfort and reportedly earlier had associated shortness of breath.  The patient tells me he did not have shortness of breath or diaphoresis.  He has a history of chronic right bundle branch block.  His initial troponin was negative.  A repeat troponin was 0.05.  He is currently pain-free.  Rezulin.  On exam, his blood pressure was 133/65.  His pulse was regular in the 80s.  He was afebrile.  HEENT was unremarkable.  He did have suggestion of soft transmitted murmurs to his carotids.  There was upper normal JVD.  He had rhonchi in his lungs without wheezing.  He was kyphotic.  Rhythm was regular with at least a [id number] systolic murmur with his aortic valve disease.  There was no S3 gallop.  Abdomen was soft, nontender, bowel sounds positive.  He had 3+ lower extremity edema with venous stasis changes and ichthyosis.  BMP was  elevated at 637.  He has stage III chronic kidney disease with a serum creatinine of 1.83 and estimated GFR of 32.  He is mildly anemic.   Troy Sine, MD, [facility] 06/12/2016 2:58 PM

## 2016-06-12 NOTE — ED Provider Notes (Signed)
10:01 AM patient is pain-free but his troponin is now mildly elevated on repeat. Given his age and other risk factors I think he needs to be admitted. We'll reconsult cardiology. Patient still wants to go home but is willing to stay if that is what is needed.  10:15 AM D/w Dr. Acie Fredrickson, cards will see but wants hospitalist admit.  10:54 AM D/w Dr. Joesph Fillers. He will come to admit. Does not want holding orders.   Sherwood Gambler, MD 06/12/16 1054

## 2016-06-12 NOTE — ED Notes (Signed)
Daughter laura called advised plan of care agreeable.

## 2016-06-13 ENCOUNTER — Observation Stay (HOSPITAL_BASED_OUTPATIENT_CLINIC_OR_DEPARTMENT_OTHER): Payer: Medicare Other

## 2016-06-13 ENCOUNTER — Observation Stay (HOSPITAL_COMMUNITY): Payer: Medicare Other

## 2016-06-13 DIAGNOSIS — R079 Chest pain, unspecified: Secondary | ICD-10-CM | POA: Diagnosis not present

## 2016-06-13 DIAGNOSIS — I4510 Unspecified right bundle-branch block: Secondary | ICD-10-CM | POA: Diagnosis present

## 2016-06-13 DIAGNOSIS — I5033 Acute on chronic diastolic (congestive) heart failure: Secondary | ICD-10-CM

## 2016-06-13 DIAGNOSIS — Z85820 Personal history of malignant melanoma of skin: Secondary | ICD-10-CM | POA: Diagnosis not present

## 2016-06-13 DIAGNOSIS — I350 Nonrheumatic aortic (valve) stenosis: Secondary | ICD-10-CM | POA: Diagnosis present

## 2016-06-13 DIAGNOSIS — I248 Other forms of acute ischemic heart disease: Secondary | ICD-10-CM | POA: Diagnosis not present

## 2016-06-13 DIAGNOSIS — I5041 Acute combined systolic (congestive) and diastolic (congestive) heart failure: Secondary | ICD-10-CM

## 2016-06-13 DIAGNOSIS — I209 Angina pectoris, unspecified: Secondary | ICD-10-CM | POA: Diagnosis not present

## 2016-06-13 DIAGNOSIS — E875 Hyperkalemia: Secondary | ICD-10-CM

## 2016-06-13 DIAGNOSIS — D631 Anemia in chronic kidney disease: Secondary | ICD-10-CM | POA: Diagnosis present

## 2016-06-13 DIAGNOSIS — R7989 Other specified abnormal findings of blood chemistry: Secondary | ICD-10-CM | POA: Diagnosis not present

## 2016-06-13 DIAGNOSIS — I130 Hypertensive heart and chronic kidney disease with heart failure and stage 1 through stage 4 chronic kidney disease, or unspecified chronic kidney disease: Secondary | ICD-10-CM | POA: Diagnosis present

## 2016-06-13 DIAGNOSIS — N183 Chronic kidney disease, stage 3 (moderate): Secondary | ICD-10-CM

## 2016-06-13 DIAGNOSIS — R0789 Other chest pain: Secondary | ICD-10-CM | POA: Diagnosis present

## 2016-06-13 DIAGNOSIS — I421 Obstructive hypertrophic cardiomyopathy: Secondary | ICD-10-CM | POA: Diagnosis not present

## 2016-06-13 DIAGNOSIS — Z79899 Other long term (current) drug therapy: Secondary | ICD-10-CM | POA: Diagnosis not present

## 2016-06-13 DIAGNOSIS — K3189 Other diseases of stomach and duodenum: Secondary | ICD-10-CM | POA: Diagnosis present

## 2016-06-13 DIAGNOSIS — I214 Non-ST elevation (NSTEMI) myocardial infarction: Secondary | ICD-10-CM | POA: Diagnosis present

## 2016-06-13 DIAGNOSIS — I252 Old myocardial infarction: Secondary | ICD-10-CM | POA: Diagnosis not present

## 2016-06-13 DIAGNOSIS — E785 Hyperlipidemia, unspecified: Secondary | ICD-10-CM | POA: Diagnosis present

## 2016-06-13 DIAGNOSIS — I10 Essential (primary) hypertension: Secondary | ICD-10-CM | POA: Diagnosis not present

## 2016-06-13 DIAGNOSIS — I878 Other specified disorders of veins: Secondary | ICD-10-CM | POA: Diagnosis present

## 2016-06-13 DIAGNOSIS — K219 Gastro-esophageal reflux disease without esophagitis: Secondary | ICD-10-CM | POA: Diagnosis present

## 2016-06-13 DIAGNOSIS — K7030 Alcoholic cirrhosis of liver without ascites: Secondary | ICD-10-CM | POA: Diagnosis present

## 2016-06-13 DIAGNOSIS — K766 Portal hypertension: Secondary | ICD-10-CM | POA: Diagnosis present

## 2016-06-13 DIAGNOSIS — I422 Other hypertrophic cardiomyopathy: Secondary | ICD-10-CM | POA: Diagnosis present

## 2016-06-13 DIAGNOSIS — Z8249 Family history of ischemic heart disease and other diseases of the circulatory system: Secondary | ICD-10-CM | POA: Diagnosis not present

## 2016-06-13 LAB — BASIC METABOLIC PANEL
ANION GAP: 8 (ref 5–15)
BUN: 37 mg/dL — ABNORMAL HIGH (ref 6–20)
CALCIUM: 8.5 mg/dL — AB (ref 8.9–10.3)
CO2: 19 mmol/L — AB (ref 22–32)
CREATININE: 1.67 mg/dL — AB (ref 0.61–1.24)
Chloride: 113 mmol/L — ABNORMAL HIGH (ref 101–111)
GFR, EST AFRICAN AMERICAN: 41 mL/min — AB (ref >60)
GFR, EST NON AFRICAN AMERICAN: 36 mL/min — AB (ref >60)
Glucose, Bld: 141 mg/dL — ABNORMAL HIGH (ref 65–99)
Potassium: 4.2 mmol/L (ref 3.5–5.1)
Sodium: 140 mmol/L (ref 135–145)

## 2016-06-13 LAB — NM MYOCAR MULTI W/SPECT W/WALL MOTION / EF
CHL CUP RESTING HR STRESS: 93 {beats}/min
CSEPED: 0 min
CSEPEDS: 0 s
CSEPHR: 76 %
Estimated workload: 1 METS
MPHR: 135 {beats}/min
Peak HR: 103 {beats}/min

## 2016-06-13 LAB — TROPONIN I: Troponin I: 0.75 ng/mL (ref <0.03)

## 2016-06-13 MED ORDER — REGADENOSON 0.4 MG/5ML IV SOLN
INTRAVENOUS | Status: AC
  Filled 2016-06-13: qty 5

## 2016-06-13 MED ORDER — REGADENOSON 0.4 MG/5ML IV SOLN
0.4000 mg | Freq: Once | INTRAVENOUS | Status: AC
  Administered 2016-06-13: 0.4 mg via INTRAVENOUS
  Filled 2016-06-13: qty 5

## 2016-06-13 MED ORDER — TECHNETIUM TC 99M TETROFOSMIN IV KIT
30.0000 | PACK | Freq: Once | INTRAVENOUS | Status: AC | PRN
  Administered 2016-06-13: 30 via INTRAVENOUS

## 2016-06-13 MED ORDER — TECHNETIUM TC 99M TETROFOSMIN IV KIT
10.0000 | PACK | Freq: Once | INTRAVENOUS | Status: AC | PRN
  Administered 2016-06-13: 10 via INTRAVENOUS

## 2016-06-13 NOTE — Progress Notes (Addendum)
Myoview result below IMPRESSION: 1. Large region of infarcted myocardium involving the apex, inferior wall, septal wall and anterior wall. No reversible ischemia.  2. Septal dyskinesia.  3. Left ventricular ejection fraction 38%  4. Non invasive risk stratification*: Intermediate owing to LEFT ventricular dysfunction.   Discussed with Dr. Stanford Breed, who over read the image and felt it is indeed showing infarction. I have discussed in detail with Dr. Vernard Gambles who is adament he is not interested in cath unless there is definitive evidence of benefit. I will discuss with Dr. Bronson Ing tomorrow. Likely medical therapy with medication adjustment. Patient wish to be discharged tomorrow.    Consideration before discharge, consider limited echo to assess EF. Consider add plavix. Need to come off IV lasix.   Hilbert Corrigan PA Pager: [phone]

## 2016-06-13 NOTE — Progress Notes (Signed)
Patient Name: [name] Date of Encounter: 06/13/2016  Primary cardiologist: Dr. Angelena Form   Principal Problem:   Pain in the chest Active Problems:   Alcohol abuse   HYPERTENSION, BENIGN   Hypertrophic obstructive cardiomyopathy (HCC)   Alcoholic cirrhosis (HCC)   Elevated troponin   RBBB    SUBJECTIVE  Denies any CP or SOB.   CURRENT MEDS . regadenoson      . regadenoson      . amLODipine  10 mg Oral Daily  . aspirin EC  81 mg Oral Daily  . folic acid  1 mg Oral Daily  . furosemide  40 mg Intravenous BID  . furosemide  40 mg Oral Daily  . isosorbide mononitrate  30 mg Oral Daily  . metoprolol succinate  50 mg Oral Daily  . multivitamin with minerals  1 tablet Oral Daily  . pantoprazole  40 mg Oral BID  . senna  1 tablet Oral BID  . simvastatin  20 mg Oral QHS  . sodium chloride flush  3 mL Intravenous Q12H  . sodium polystyrene  15 g Oral Once  . thiamine  100 mg Oral Daily    OBJECTIVE  Vitals:   06/13/16 1024 06/13/16 1025 06/13/16 1027 06/13/16 1028  BP: (!) 158/68 (!) 143/60 (!) 148/62 (!) 148/62  Pulse: 100 100 (!) 101 (!) 101  Resp:      Temp:      TempSrc:      SpO2:      Weight:      Height:        Intake/Output Summary (Last 24 hours) at 06/13/16 1031 Last data filed at 06/13/16 1000  Gross per 24 hour  Intake              480 ml  Output             1175 ml  Net             -695 ml   Filed Weights   06/12/16 0542 06/13/16 0500  Weight: 162 lb (73.5 kg) 168 lb 12.8 oz (76.6 kg)    PHYSICAL EXAM  General: Pleasant, NAD. Neuro: Alert and oriented X 3. Moves all extremities spontaneously. Psych: Normal affect. HEENT:  Normal  Neck: Supple without bruits or JVD. Lungs:  Resp regular and unlabored. L basilar rale.  Heart: RRR no s3, s4. 4/6 holosystolic murmur Abdomen: Soft, non-tender, non-distended, BS + x 4.  Extremities: No clubbing, cyanosis or edema. DP/PT/Radials 2+ and equal bilaterally.  Accessory Clinical  Findings  CBC  Recent Labs  06/12/16 0556  WBC 8.3  NEUTROABS 5.8  HGB 10.3*  HCT 33.3*  MCV 99.7  PLT [id number]   Basic Metabolic Panel  Recent Labs  06/12/16 0900 06/13/16 0035  NA 140 140  K 5.0 4.2  CL 115* 113*  CO2 19* 19*  GLUCOSE 97 141*  BUN 39* 37*  CREATININE 1.83* 1.67*  CALCIUM 8.3* 8.5*   Liver Function Tests  Recent Labs  06/12/16 0556  AST 24  ALT 15*  ALKPHOS 85  BILITOT 0.6  PROT 6.0*  ALBUMIN 3.0*   No results for input(s): LIPASE, AMYLASE in the last 72 hours. Cardiac Enzymes  Recent Labs  06/12/16 1420 06/12/16 2014 06/13/16 0035  TROPONINI 0.42* 0.81* 0.75*     ECG  No new EKG  Echocardiogram 05/01/2016  LV EF: 60% -   65%  ------------------------------------------------------------------- Indications:      Chest pain 786.51.  -------------------------------------------------------------------  History:   PMH:   Aortic valve disease.  PMH:   Myocardial infarction.  Risk factors:  Hypertension. Dyslipidemia.  ------------------------------------------------------------------- Study Conclusions  - Left ventricle: The cavity size was normal. There was moderate   concentric hypertrophy. Systolic function was normal. The   estimated ejection fraction was in the range of 60% to 65%. Wall   motion was normal; there were no regional wall motion   abnormalities. Doppler parameters are consistent with abnormal   left ventricular relaxation (grade 1 diastolic dysfunction).   Doppler parameters are consistent with elevated ventricular   end-diastolic filling pressure. - Aortic valve: There was mild stenosis. There was moderate   regurgitation. Mean gradient (S): 14 mm Hg. Peak gradient (S): 31   mm Hg. Valve area (VTI): 2.78 cm^2. Valve area (Vmax): 2.69 cm^2.   Valve area (Vmean): 2.49 cm^2. - Aortic root: The aortic root was normal in size. - Mitral valve: Structurally normal valve. There was no   regurgitation. - Left  atrium: The atrium was moderately dilated. - Right ventricle: The cavity size was normal. Wall thickness was   normal. Systolic function was normal. - Right atrium: The atrium was normal in size. - Tricuspid valve: There was moderate regurgitation. - Pulmonic valve: There was mild regurgitation. - Pulmonary arteries: Systolic pressure was mildly increased. PA   peak pressure: 43 mm Hg (S). - Inferior vena cava: The vessel was normal in size. - Pericardium, extracardiac: There was no pericardial effusion.     Radiology/Studies  Dg Chest 2 View  Result Date: 06/12/2016 CLINICAL DATA:  82 year old male with chest pain and shortness of breath EXAM: CHEST  2 VIEW COMPARISON:  Chest radiograph dated 05/28/2016 FINDINGS: Shallow inspiration. There is eventration of the right hemidiaphragm with atelectatic changes of the right lung base. Patchy area of hazy density at the left lung base may represent atelectasis versus infiltrate. There is no pneumothorax. Stable enlarged cardiac silhouette. No acute osseous pathology. IMPRESSION: Patchy left lung base airspace opacity, atelectasis versus infiltrate. Clinical correlation and follow-up recommended. Electronically Signed   By: Anner Crete M.D.   On: 06/12/2016 06:47   Dg Chest 2 View  Result Date: 05/29/2016 CLINICAL DATA:  82 year old male with history of crackles in the left lung on physical examination. EXAM: CHEST  2 VIEW COMPARISON:  Chest x-ray 04/29/2016. FINDINGS: Chronic elevation of the right hemidiaphragm. Low lung volumes. Some bibasilar linear opacities are noted, similar to the prior study, favored to represent areas of mild chronic scarring and/or subsegmental atelectasis. No definite consolidative airspace disease. No pleural effusions. No evidence of pulmonary edema. Heart size is normal. Upper mediastinal contours are within normal limits. Atherosclerosis in the thoracic aorta. IMPRESSION: 1. Low lung volumes with mild bibasilar  subsegmental atelectasis and/or scarring. No radiographic evidence of acute cardiopulmonary disease. 2. Chronic elevation of the right hemidiaphragm is unchanged. 3. Aortic atherosclerosis. Electronically Signed   By: Vinnie Langton M.D.   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 Last Echo was in 07/2015 with moderate basal septal hypertrophy with no clear HOCM physiology, normal LV function. He presented to the ED on 06/12/16 with chest pain and SOB that awakened him from sleep. Serial trop went up to 0.8  1. NSTEMI  - chest pain free, only had mild transient chest discomfort yesterday  - serial trop went up, patient has had  resting image, discussed with Dr. Bronson Ing, will proceed with [facility] for risk stratification. Likely cath on Monday esp if myoview abnormal  - would be higher risk of contrast nephrology given underlying renal function  2. Acute on chronic diastolic heart failure: still fluid overloaded, will continue 40mg  BID IV lasix, monitor renal function closely  3. HTN  4. HLD  5. HOCM  6. Stage III CKD  Signed, Almyra Deforest PA-C Pager: [phone]  The patient was seen and examined, and I agree with the physical exam, assessment and plan as documented above by H. Meng, with modifications as noted below. Pt denies chest pain. Awaiting nuclear MPI study results. Continue to diurese. Has had roughly 700 cc output in last 24+ hrs. Continue ASA, beta blocker, statin, and nitrates.  Kate Sable, MD, [facility]  06/13/2016 12:28 PM

## 2016-06-13 NOTE — Progress Notes (Signed)
Patient ID: [name], male   DOB: [birth year], 82 y.o.   MRN: [id number]  PROGRESS NOTE    [name]  [id number] DOB: [birth year] DOA: 06/12/2016  PCP: [name], MD   Brief Narrative:  82 year old male with a past medical history of HOCM, HTN, HLD, and ETOH abuse who presented to [facility] ED with chest pain and shortness of breath that awakened him from sleep. Pt was found to have troponin levels up 0.81. Cardio has seen him in consultation.   Assessment & Plan:   NSTEMI - Troponin level 0.42 --> 0.81 --> 0.75 - Myoview this am - pending - No reports of chest pain - Continue daily aspirin   Hyperkalemia - Treated with IV lasix and kayexalate  - Repeat level WNL  Dyslipidemia - Continue statin therapy   Essential hypertension - Continue norvasc, imdur  Acute on chronic diastolic heart failure - 2 D ECHO in 04/2016 showed EF 60% - Per cardio, continue diuresis with IV lasix - Daily weight and strict intake and output  Chronic kidney disease stage 3 - Baseline Cr 1.9 in 04/2016 - CR within baseline range on this admission   Anemia of chronic kidney disease - Hgb stable   DVT prophylaxis: SCD's bilaterally  Code Status: full code  Family Communication: no family at the bedside this am   Disposition Plan: home once clear by cardio    Consultants:   Cardiology   Procedures:   Nuclear stress test   Antimicrobials:   None     Subjective: No overnight events.   Objective: Vitals:   06/13/16 1024 06/13/16 1025 06/13/16 1027 06/13/16 1028  BP: (!) 158/68 (!) 143/60 (!) 148/62 (!) 148/62  Pulse: 100 100 (!) 101 (!) 101  Resp:      Temp:      TempSrc:      SpO2:      Weight:      Height:        Intake/Output Summary (Last 24 hours) at 06/13/16 1124 Last data filed at 06/13/16 1000  Gross per 24 hour  Intake              480 ml  Output             1175 ml  Net             -695 ml   Filed Weights   06/12/16 0542 06/13/16 0500  Weight:  73.5 kg (162 lb) 76.6 kg (168 lb 12.8 oz)    Examination:  General exam: Appears calm and comfortable  Respiratory system: Clear to auscultation. Respiratory effort normal. Cardiovascular system: S1 & S2 heard, RRR.  Gastrointestinal system: Abdomen is nondistended, soft and nontender. Central nervous system: Alert and oriented. No focal neurological deficits. Extremities: Symmetric 5 x 5 power. Skin: No rashes, lesions or ulcers Psychiatry: Judgement and insight appear normal. Mood & affect appropriate.   Data Reviewed: I have personally reviewed following labs and imaging studies  CBC:  Recent Labs Lab 06/12/16 0556  WBC 8.3  NEUTROABS 5.8  HGB 10.3*  HCT 33.3*  MCV 99.7  PLT [id number]   Basic Metabolic Panel:  Recent Labs Lab 06/12/16 0556 06/12/16 0900 06/13/16 0035  NA 141 140 140  K 5.2* 5.0 4.2  CL 113* 115* 113*  CO2 19* 19* 19*  GLUCOSE 104* 97 141*  BUN 41* 39* 37*  CREATININE 1.88* 1.83* 1.67*  CALCIUM 8.5* 8.3* 8.5*   GFR: Estimated Creatinine  Clearance: 31.3 mL/min (by C-G formula based on SCr of 1.67 mg/dL). Liver Function Tests:  Recent Labs Lab 06/12/16 0556  AST 24  ALT 15*  ALKPHOS 85  BILITOT 0.6  PROT 6.0*  ALBUMIN 3.0*   No results for input(s): LIPASE, AMYLASE in the last 168 hours. No results for input(s): AMMONIA in the last 168 hours. Coagulation Profile:  Recent Labs Lab 06/12/16 0556  INR 1.03   Cardiac Enzymes:  Recent Labs Lab 06/12/16 0556 06/12/16 0900 06/12/16 1420 06/12/16 2014 06/13/16 0035  TROPONINI <0.03 0.05* 0.42* 0.81* 0.75*   BNP (last 3 results)  Recent Labs  07/22/15 1536  PROBNP 61.0   HbA1C: No results for input(s): HGBA1C in the last 72 hours. CBG: No results for input(s): GLUCAP in the last 168 hours. Lipid Profile: No results for input(s): CHOL, HDL, LDLCALC, TRIG, CHOLHDL, LDLDIRECT in the last 72 hours. Thyroid Function Tests: No results for input(s): TSH, T4TOTAL, FREET4, T3FREE,  THYROIDAB in the last 72 hours. Anemia Panel: No results for input(s): VITAMINB12, FOLATE, FERRITIN, TIBC, IRON, RETICCTPCT in the last 72 hours. Urine analysis:    Component Value Date/Time   COLORURINE YELLOW 04/13/2016 1154   APPEARANCEUR CLEAR 04/13/2016 1154   LABSPEC 1.013 04/13/2016 1154   PHURINE 5.0 04/13/2016 1154   GLUCOSEU NEGATIVE 04/13/2016 1154   GLUCOSEU NEGATIVE 07/17/2015 1148   HGBUR NEGATIVE 04/13/2016 1154   BILIRUBINUR NEGATIVE 04/13/2016 1154   KETONESUR NEGATIVE 04/13/2016 1154   PROTEINUR 30 (A) 04/13/2016 1154   UROBILINOGEN 0.2 07/17/2015 1148   NITRITE NEGATIVE 04/13/2016 1154   LEUKOCYTESUR NEGATIVE 04/13/2016 1154   Sepsis Labs: @LABRCNTIP (procalcitonin:4,lacticidven:4)   )No results found for this or any previous visit (from the past 240 hour(s)).    Radiology Studies: Dg Chest 2 View Result Date: 06/12/2016 CLINICAL DATA:   Patchy left lung base airspace opacity, atelectasis versus infiltrate. Clinical correlation and follow-up recommended.     Scheduled Meds: . regadenoson      . regadenoson      . amLODipine  10 mg Oral Daily  . aspirin EC  81 mg Oral Daily  . folic acid  1 mg Oral Daily  . furosemide  40 mg Intravenous BID  . furosemide  40 mg Oral Daily  . isosorbide mononitrate  30 mg Oral Daily  . metoprolol succinate  50 mg Oral Daily  . multivitamin with minerals  1 tablet Oral Daily  . pantoprazole  40 mg Oral BID  . senna  1 tablet Oral BID  . simvastatin  20 mg Oral QHS  . sodium chloride flush  3 mL Intravenous Q12H  . sodium polystyrene  15 g Oral Once  . thiamine  100 mg Oral Daily   Continuous Infusions:    LOS: 0 days    Time spent: 15 minutes  Greater than 50% of the time spent on counseling and coordinating the care.   Leisa Lenz, MD [facility] Pager [phone]  If 7PM-7AM, please contact night-coverage [email or web address] Password [email or web address] 06/13/2016, 11:24 AM

## 2016-06-14 DIAGNOSIS — E785 Hyperlipidemia, unspecified: Secondary | ICD-10-CM

## 2016-06-14 DIAGNOSIS — I209 Angina pectoris, unspecified: Secondary | ICD-10-CM

## 2016-06-14 DIAGNOSIS — R931 Abnormal findings on diagnostic imaging of heart and coronary circulation: Secondary | ICD-10-CM

## 2016-06-14 DIAGNOSIS — I214 Non-ST elevation (NSTEMI) myocardial infarction: Principal | ICD-10-CM

## 2016-06-14 MED ORDER — METOPROLOL SUCCINATE ER 50 MG PO TB24
75.0000 mg | ORAL_TABLET | Freq: Every day | ORAL | Status: DC
  Administered 2016-06-14 – 2016-06-15 (×2): 75 mg via ORAL
  Filled 2016-06-14 (×2): qty 1

## 2016-06-14 MED ORDER — CLOPIDOGREL BISULFATE 75 MG PO TABS: 300.0000 mg | ORAL_TABLET | Freq: Once | ORAL | Status: DC

## 2016-06-14 MED ORDER — CLOPIDOGREL BISULFATE 75 MG PO TABS
75.0000 mg | ORAL_TABLET | Freq: Every day | ORAL | Status: DC
  Administered 2016-06-15: 75 mg via ORAL
  Filled 2016-06-14: qty 1

## 2016-06-14 NOTE — Progress Notes (Addendum)
Patient ID: [name], male   DOB: January 24, 1929, 82 y.o.   MRN: [id number]  PROGRESS NOTE    [name]  [id number] DOB: 12-Feb-1929 DOA: 06/12/2016  PCP: [name], MD   Brief Narrative:  82 year old male with a past medical history of HOCM, HTN, HLD, and ETOH abuse who presented to [facility] ED with chest pain and shortness of breath that awakened him from sleep. Pt was found to have troponin levels up 0.81. Cardio has seen him in consultation.   Assessment & Plan:   NSTEMI - Likely demand ischemia from renal insufficiency - Pt has no reports of chest pain  - Troponin level 0.42 --> 0.81 --> 0.75 - Myoview 06/13/2016 showed large region of infarcted myocardium involving the apex, inferior wall, septal wall and anterior wall. No reversible ischemia. Septal dyskinesia. Left ventricular ejection fraction 38% - Continue daily aspirin and plavix   Hyperkalemia - Treated with IV lasix and kayexalate  - Repeat level WNL  Dyslipidemia - Continue statin therapy   Essential hypertension - Continue norvasc, imdur, metoprolol   Acute on chronic diastolic heart failure - 2 D ECHO in 04/2016 showed EF 60% - Per cardio, continue diuresis with lasix, cardio changed from IV to PO lasix this am - Daily weight and strict intake and output - Weight since 06/12/2016 73.5 kg --> 76.6 kg --> 70.9 kg   Chronic kidney disease stage 3 - Baseline Cr 1.9 in 04/2016 - Cr within baseline range on this admission ( 1.6 - 1.8)   Anemia of chronic kidney disease - Hgb stable   DVT prophylaxis: SCD's bilaterally  Code Status: full code  Family Communication: no family at the bedside this am; called his son over the phone and left VM with update and my cell number to call me back for questions  Disposition Plan: home once clear by cardio    Consultants:   Cardiology   Procedures:   Nuclear stress test 8/5/20017 - Large region of infarcted myocardium involving the apex, inferior wall, septal  wall and anterior wall. No reversible ischemia. Septal dyskinesia. Left ventricular ejection fraction 38%  Antimicrobials:   None     Subjective: No overnight events.   Objective: Vitals:   06/13/16 2000 06/13/16 2355 06/14/16 0529 06/14/16 0843  BP: (!) 136/59 (!) 141/63 (!) 133/54 (!) 135/58  Pulse: 89 94 87 94  Resp: 18 18 19    Temp: 98.3 F (36.8 C) 99.3 F (37.4 C) 97.7 F (36.5 C) 98.1 F (36.7 C)  TempSrc:    Oral  SpO2: 97% 95% 98% 98%  Weight:   70.9 kg (156 lb 4.8 oz)   Height:        Intake/Output Summary (Last 24 hours) at 06/14/16 1100 Last data filed at 06/14/16 0500  Gross per 24 hour  Intake              120 ml  Output              375 ml  Net             -255 ml   Filed Weights   06/12/16 0542 06/13/16 0500 06/14/16 0529  Weight: 73.5 kg (162 lb) 76.6 kg (168 lb 12.8 oz) 70.9 kg (156 lb 4.8 oz)    Examination:  General exam: Appears calm and comfortable, no distress Respiratory system: Clear to auscultation. Respiratory effort normal. Cardiovascular system: S1 & S2 heard, Rate controlled. Murmur appreciated  Gastrointestinal system: Abdomen  is nondistended, soft and nontender. (+) BS Central nervous system: ANo focal neurological deficits. Extremities: Symmetric 5 x 5 power. LE edema with superimposed skin changes  Skin: warm, dry  Psychiatry: Normal mood and behavior   Data Reviewed: I have personally reviewed following labs and imaging studies  CBC:  Recent Labs Lab 06/12/16 0556  WBC 8.3  NEUTROABS 5.8  HGB 10.3*  HCT 33.3*  MCV 99.7  PLT Q000111Q   Basic Metabolic Panel:  Recent Labs Lab 06/12/16 0556 06/12/16 0900 06/13/16 0035  NA 141 140 140  K 5.2* 5.0 4.2  CL 113* 115* 113*  CO2 19* 19* 19*  GLUCOSE 104* 97 141*  BUN 41* 39* 37*  CREATININE 1.88* 1.83* 1.67*  CALCIUM 8.5* 8.3* 8.5*   GFR: Estimated Creatinine Clearance: 31.3 mL/min (by C-G formula based on SCr of 1.67 mg/dL). Liver Function Tests:  Recent  Labs Lab 06/12/16 0556  AST 24  ALT 15*  ALKPHOS 85  BILITOT 0.6  PROT 6.0*  ALBUMIN 3.0*   No results for input(s): LIPASE, AMYLASE in the last 168 hours. No results for input(s): AMMONIA in the last 168 hours. Coagulation Profile:  Recent Labs Lab 06/12/16 0556  INR 1.03   Cardiac Enzymes:  Recent Labs Lab 06/12/16 0556 06/12/16 0900 06/12/16 1420 06/12/16 2014 06/13/16 0035  TROPONINI <0.03 0.05* 0.42* 0.81* 0.75*   BNP (last 3 results)  Recent Labs  07/22/15 1536  PROBNP 61.0   HbA1C: No results for input(s): HGBA1C in the last 72 hours. CBG: No results for input(s): GLUCAP in the last 168 hours. Lipid Profile: No results for input(s): CHOL, HDL, LDLCALC, TRIG, CHOLHDL, LDLDIRECT in the last 72 hours. Thyroid Function Tests: No results for input(s): TSH, T4TOTAL, FREET4, T3FREE, THYROIDAB in the last 72 hours. Anemia Panel: No results for input(s): VITAMINB12, FOLATE, FERRITIN, TIBC, IRON, RETICCTPCT in the last 72 hours. Urine analysis:    Component Value Date/Time   COLORURINE YELLOW 04/13/2016 1154   APPEARANCEUR CLEAR 04/13/2016 1154   LABSPEC 1.013 04/13/2016 1154   PHURINE 5.0 04/13/2016 1154   GLUCOSEU NEGATIVE 04/13/2016 1154   GLUCOSEU NEGATIVE 07/17/2015 1148   HGBUR NEGATIVE 04/13/2016 1154   BILIRUBINUR NEGATIVE 04/13/2016 1154   KETONESUR NEGATIVE 04/13/2016 1154   PROTEINUR 30 (A) 04/13/2016 1154   UROBILINOGEN 0.2 07/17/2015 1148   NITRITE NEGATIVE 04/13/2016 1154   LEUKOCYTESUR NEGATIVE 04/13/2016 1154   Sepsis Labs: @LABRCNTIP (procalcitonin:4,lacticidven:4)   )No results found for this or any previous visit (from the past 240 hour(s)).    Radiology Studies: Dg Chest 2 View Result Date: 06/12/2016 CLINICAL DATA:   Patchy left lung base airspace opacity, atelectasis versus infiltrate. Clinical correlation and follow-up recommended.     Scheduled Meds: . amLODipine  10 mg Oral Daily  . aspirin EC  81 mg Oral Daily  .  clopidogrel  300 mg Oral Once  . [START ON 06/15/2016] clopidogrel  75 mg Oral Daily  . folic acid  1 mg Oral Daily  . furosemide  40 mg Oral Daily  . isosorbide mononitrate  30 mg Oral Daily  . metoprolol succinate  75 mg Oral Daily  . multivitamin with minerals  1 tablet Oral Daily  . pantoprazole  40 mg Oral BID  . senna  1 tablet Oral BID  . simvastatin  20 mg Oral QHS  . sodium chloride flush  3 mL Intravenous Q12H  . thiamine  100 mg Oral Daily   Continuous Infusions:    LOS: 1  day    Time spent: 15 minutes  Greater than 50% of the time spent on counseling and coordinating the care.   Leisa Lenz, MD [facility] Pager [phone]  If 7PM-7AM, please contact night-coverage [email or web address] Password [email or web address] 06/14/2016, 11:00 AM

## 2016-06-14 NOTE — Progress Notes (Signed)
Pt is from [employer].  He has his own apartment.  Per son, there is an aide that is there with pt one hour in the am and one hour in the evening.    Pt moved in 6-8 months ago.  Wife recently passed away (3-4 weeks ago).    Family is willing to coordinate increased care/support for pt for longer periods of time after d/c.  They would like to avoid having to change the current living situation (moving out of apartment) because of recent life events.

## 2016-06-14 NOTE — Progress Notes (Signed)
SUBJECTIVE: Denies chest pain and shortness of breath. Does not want cath. Nuclear MPI study showed infarction, no ischemia.   ROS: Other than pertinent positives in "Subjective", all others were reviewed and found to be negative.   Intake/Output Summary (Last 24 hours) at 06/14/16 0806 Last data filed at 06/14/16 0500  Gross per 24 hour  Intake              120 ml  Output              375 ml  Net             -255 ml    Current Facility-Administered Medications  Medication Dose Route Frequency Provider Last Rate Last Dose  . 0.9 %  sodium chloride infusion  250 mL Intravenous PRN Roxan Hockey, MD      . acetaminophen (TYLENOL) tablet 650 mg  650 mg Oral Q6H PRN Roxan Hockey, MD   650 mg at 06/14/16 0140   Or  . acetaminophen (TYLENOL) suppository 650 mg  650 mg Rectal Q6H PRN Courage Emokpae, MD      . albuterol (PROVENTIL) (2.5 MG/3ML) 0.083% nebulizer solution 2.5 mg  2.5 mg Nebulization Q2H PRN Courage Emokpae, MD      . ALPRAZolam Duanne Moron) tablet 0.25 mg  0.25 mg Oral QHS PRN Roxan Hockey, MD      . amLODipine (NORVASC) tablet 10 mg  10 mg Oral Daily Roxan Hockey, MD   10 mg at 06/13/16 1142  . aspirin EC tablet 81 mg  81 mg Oral Daily Courage Emokpae, MD   81 mg at 06/13/16 1140  . folic acid (FOLVITE) tablet 1 mg  1 mg Oral Daily Courage Emokpae, MD   1 mg at 06/13/16 1142  . furosemide (LASIX) injection 40 mg  40 mg Intravenous BID Arbutus Leas, NP   40 mg at 06/13/16 1827  . furosemide (LASIX) tablet 40 mg  40 mg Oral Daily Troy Sine, MD   40 mg at 06/13/16 1425  . isosorbide mononitrate (IMDUR) 24 hr tablet 30 mg  30 mg Oral Daily Roxan Hockey, MD   30 mg at 06/13/16 1141  . LORazepam (ATIVAN) tablet 1 mg  1 mg Oral Q6H PRN Roxan Hockey, MD       Or  . LORazepam (ATIVAN) injection 1 mg  1 mg Intravenous Q6H PRN Roxan Hockey, MD      . metoprolol succinate (TOPROL-XL) 24 hr tablet 50 mg  50 mg Oral Daily Roxan Hockey, MD   50 mg at  06/13/16 1142  . multivitamin with minerals tablet 1 tablet  1 tablet Oral Daily Roxan Hockey, MD   1 tablet at 06/13/16 1143  . ondansetron (ZOFRAN) tablet 4 mg  4 mg Oral Q6H PRN Roxan Hockey, MD       Or  . ondansetron (ZOFRAN) injection 4 mg  4 mg Intravenous Q6H PRN Courage Emokpae, MD      . oxyCODONE (Oxy IR/ROXICODONE) immediate release tablet 5 mg  5 mg Oral Q4H PRN Courage Emokpae, MD      . pantoprazole (PROTONIX) EC tablet 40 mg  40 mg Oral BID Roxan Hockey, MD   40 mg at 06/13/16 2128  . polyethylene glycol (MIRALAX / GLYCOLAX) packet 17 g  17 g Oral Daily PRN Roxan Hockey, MD      . senna (SENOKOT) tablet 8.6 mg  1 tablet Oral BID Roxan Hockey, MD   8.6 mg  at 06/13/16 1142  . simvastatin (ZOCOR) tablet 20 mg  20 mg Oral QHS Roxan Hockey, MD   20 mg at 06/13/16 2128  . sodium chloride flush (NS) 0.9 % injection 3 mL  3 mL Intravenous Q12H Roxan Hockey, MD   3 mL at 06/13/16 2200  . sodium chloride flush (NS) 0.9 % injection 3 mL  3 mL Intravenous PRN Courage Emokpae, MD      . sodium polystyrene (KAYEXALATE) 15 GM/60ML suspension 15 g  15 g Oral Once Roxan Hockey, MD      . thiamine (VITAMIN B-1) tablet 100 mg  100 mg Oral Daily Courage Emokpae, MD   100 mg at 06/13/16 1142  . traZODone (DESYREL) tablet 50 mg  50 mg Oral QHS PRN Roxan Hockey, MD   50 mg at 06/13/16 2351    Vitals:   06/13/16 1827 06/13/16 2000 06/13/16 2355 06/14/16 0529  BP: (!) 141/68 (!) 136/59 (!) 141/63 (!) 133/54  Pulse:  89 94 87  Resp:  18 18 19   Temp:  98.3 F (36.8 C) 99.3 F (37.4 C) 97.7 F (36.5 C)  TempSrc:      SpO2:  97% 95% 98%  Weight:    156 lb 4.8 oz (70.9 kg)  Height:        PHYSICAL EXAM General: NAD HEENT: Normal. Neck: No JVD, no thyromegaly.  Lungs: Bibasilar rales. CV: Nondisplaced PMI.  Tachycardic, regular rhythm, normal S1/S2, no S3/S4, no murmur.  No pretibial edema. Abdomen: Soft, nontender, no hepatosplenomegaly, no distention.  Neurologic:  Alert and oriented x 3.  Psych: Normal affect. Musculoskeletal: No gross deformities. Extremities: No clubbing or cyanosis.    LABS: Basic Metabolic Panel:  Recent Labs  06/12/16 0900 06/13/16 0035  NA 140 140  K 5.0 4.2  CL 115* 113*  CO2 19* 19*  GLUCOSE 97 141*  BUN 39* 37*  CREATININE 1.83* 1.67*  CALCIUM 8.3* 8.5*   Liver Function Tests:  Recent Labs  06/12/16 0556  AST 24  ALT 15*  ALKPHOS 85  BILITOT 0.6  PROT 6.0*  ALBUMIN 3.0*   No results for input(s): LIPASE, AMYLASE in the last 72 hours. CBC:  Recent Labs  06/12/16 0556  WBC 8.3  NEUTROABS 5.8  HGB 10.3*  HCT 33.3*  MCV 99.7  PLT 229   Cardiac Enzymes:  Recent Labs  06/12/16 1420 06/12/16 2014 06/13/16 0035  TROPONINI 0.42* 0.81* 0.75*   BNP: Invalid input(s): POCBNP D-Dimer: No results for input(s): DDIMER in the last 72 hours. Hemoglobin A1C: No results for input(s): HGBA1C in the last 72 hours. Fasting Lipid Panel: No results for input(s): CHOL, HDL, LDLCALC, TRIG, CHOLHDL, LDLDIRECT in the last 72 hours. Thyroid Function Tests: No results for input(s): TSH, T4TOTAL, T3FREE, THYROIDAB in the last 72 hours.  Invalid input(s): FREET3 Anemia Panel: No results for input(s): VITAMINB12, FOLATE, FERRITIN, TIBC, IRON, RETICCTPCT in the last 72 hours.  RADIOLOGY: Dg Chest 2 View  Result Date: 06/12/2016 CLINICAL DATA:  82 year old male with chest pain and shortness of breath EXAM: CHEST  2 VIEW COMPARISON:  Chest radiograph dated 05/28/2016 FINDINGS: Shallow inspiration. There is eventration of the right hemidiaphragm with atelectatic changes of the right lung base. Patchy area of hazy density at the left lung base may represent atelectasis versus infiltrate. There is no pneumothorax. Stable enlarged cardiac silhouette. No acute osseous pathology. IMPRESSION: Patchy left lung base airspace opacity, atelectasis versus infiltrate. Clinical correlation and follow-up recommended.  Electronically Signed  By: Anner Crete M.D.   On: 06/12/2016 06:47   Dg Chest 2 View  Result Date: 05/29/2016 CLINICAL DATA:  82 year old male with history of crackles in the left lung on physical examination. EXAM: CHEST  2 VIEW COMPARISON:  Chest x-ray 04/29/2016. FINDINGS: Chronic elevation of the right hemidiaphragm. Low lung volumes. Some bibasilar linear opacities are noted, similar to the prior study, favored to represent areas of mild chronic scarring and/or subsegmental atelectasis. No definite consolidative airspace disease. No pleural effusions. No evidence of pulmonary edema. Heart size is normal. Upper mediastinal contours are within normal limits. Atherosclerosis in the thoracic aorta. IMPRESSION: 1. Low lung volumes with mild bibasilar subsegmental atelectasis and/or scarring. No radiographic evidence of acute cardiopulmonary disease. 2. Chronic elevation of the right hemidiaphragm is unchanged. 3. Aortic atherosclerosis. Electronically Signed   By: Vinnie Langton M.D.   On: 05/29/2016 07:09   Nm Myocar Multi W/spect W/wall Motion / Ef  Result Date: 06/13/2016 CLINICAL DATA:  82 year old male with chest pain hypertension was short of breath. EXAM: MYOCARDIAL IMAGING WITH SPECT (REST AND PHARMACOLOGIC-STRESS) GATED LEFT VENTRICULAR WALL MOTION STUDY LEFT VENTRICULAR EJECTION FRACTION TECHNIQUE: Standard myocardial SPECT imaging was performed after resting intravenous injection of 10 mCi Tc-74m tetrofosmin. Subsequently, intravenous infusion of Lexiscan was performed under the supervision of the Cardiology staff. At peak effect of the drug, 30 mCi Tc-75m tetrofosmin was injected intravenously and standard myocardial SPECT imaging was performed. Quantitative gated imaging was also performed to evaluate left ventricular wall motion, and estimate left ventricular ejection fraction. COMPARISON:  None. FINDINGS: Perfusion: There is a large region of decreased profusion involving the apex,  anterior, mid, and basilar segments of the inferior wall, septal wall and the anterior wall. This large defect is matched on rest and stress. Preserved profusion to the lateral wall. Wall Motion:  Septal dyskinesia. Left Ventricular Ejection Fraction: 38 % End diastolic volume [id number] ml End systolic volume 94 ml IMPRESSION: 1. Large region of infarcted myocardium involving the apex, inferior wall, septal wall and anterior wall. No reversible ischemia. 2. Septal dyskinesia. 3. Left ventricular ejection fraction 38% 4. Non invasive risk stratification*: Intermediate owing to LEFT ventricular dysfunction. *2012 Appropriate Use Criteria for Coronary Revascularization Focused Update: J Am Coll Cardiol. [id number]. http://content.airportbarriers.com.aspx?articleid=[email or web address] Electronically Signed   By: Suzy Bouchard M.D.   On: 06/13/2016 13:57      ASSESSMENT AND PLAN: 1. NSTEMI: Nuclear MPI study showed large area of scar, no ischemia, and reduced LVEF. Will obtain limited echo to assess LVEF, which had been normal earlier this year. Will add Plavix and increase Toprol-XL to 75 mg daily. Continue ASA and statin. Due to CKD, will not add ACEI/ARB.  2. Acute on chronic diastolic heart failure: Will switch IV to oral Lasix (dose dependent upon LVEF by echo today).  3. Essential HTN: Controlled.   4. Hyperlipidemia: On statin.  5. CKD III: Continue to monitor.  Time spent: 35 minutes.   Time spent:  Greater than 50% was spent in counseling (XXXX) and coordination of care with the patient.  Kate Sable, M.D., F.A.C.C.

## 2016-06-15 ENCOUNTER — Inpatient Hospital Stay (HOSPITAL_COMMUNITY): Payer: Medicare Other

## 2016-06-15 DIAGNOSIS — R0789 Other chest pain: Secondary | ICD-10-CM

## 2016-06-15 LAB — ECHOCARDIOGRAM LIMITED
Height: 68 in
WEIGHTICAEL: 2660.8 [oz_av]

## 2016-06-15 MED ORDER — METOPROLOL SUCCINATE ER 25 MG PO TB24: 75.0000 mg | ORAL_TABLET | Freq: Every day | ORAL | 0 refills | Status: DC

## 2016-06-15 MED ORDER — CLOPIDOGREL BISULFATE 75 MG PO TABS: 75.0000 mg | ORAL_TABLET | Freq: Every day | ORAL | 0 refills | Status: DC

## 2016-06-15 MED ORDER — ASPIRIN 81 MG PO TBEC: 81.0000 mg | DELAYED_RELEASE_TABLET | Freq: Every day | ORAL | 0 refills | Status: AC

## 2016-06-15 MED ORDER — AMMONIUM LACTATE 12 % EX LOTN
TOPICAL_LOTION | CUTANEOUS | Status: DC | PRN
  Filled 2016-06-15: qty 400

## 2016-06-15 NOTE — Plan of Care (Signed)
Problem: Phase I Progression Outcomes Goal: Voiding-avoid urinary catheter unless indicated Outcome: Completed/Met Date Met: 06/15/16 Pt using urinal and ambulating with walker to bathroom

## 2016-06-15 NOTE — Discharge Instructions (Addendum)
Chest Pain Observation It is often hard to give a specific diagnosis for the cause of chest pain. Among other possibilities your symptoms might be caused by inadequate oxygen delivery to your heart (angina). Angina that is not treated or evaluated can lead to a heart attack (myocardial infarction) or death. Blood tests, electrocardiograms, and X-rays may have been done to help determine a possible cause of your chest pain. After evaluation and observation, your health care provider has determined that it is unlikely your pain was caused by an unstable condition that requires hospitalization. However, a full evaluation of your pain may need to be completed, with additional diagnostic testing as directed. It is very important to keep your follow-up appointments. Not keeping your follow-up appointments could result in permanent heart damage, disability, or death. If there is any problem keeping your follow-up appointments, you must call your health care provider. HOME CARE INSTRUCTIONS  Due to the slight chance that your pain could be angina, it is important to follow your health care provider's treatment plan and also maintain a healthy lifestyle:  Maintain or work toward achieving a healthy weight.  Stay physically active and exercise regularly.  Decrease your salt intake.  Eat a balanced, healthy diet. Talk to a dietitian to learn about heart-healthy foods.  Increase your fiber intake by including whole grains, vegetables, fruits, and nuts in your diet.  Avoid situations that cause stress, anger, or depression.  Take medicines as advised by your health care provider. Report any side effects to your health care provider. Do not stop medicines or adjust the dosages on your own.  Quit smoking. Do not use nicotine patches or gum until you check with your health care provider.  Keep your blood pressure, blood sugar, and cholesterol levels within normal limits.  Limit alcohol intake to no more  than 1 drink per day for women who are not pregnant and 2 drinks per day for men.  Do not abuse drugs. SEEK IMMEDIATE MEDICAL CARE IF: You have severe chest pain or pressure which may include symptoms such as:  You feel pain or pressure in your arms, neck, jaw, or back.  You have severe back or abdominal pain, feel sick to your stomach (nauseous), or throw up (vomit).  You are sweating profusely.  You are having a fast or irregular heartbeat.  You feel short of breath while at rest.  You notice increasing shortness of breath during rest, sleep, or with activity.  You have chest pain that does not get better after rest or after taking your usual medicine.  You wake from sleep with chest pain.  You are unable to sleep because you cannot breathe.  You develop a frequent cough or you are coughing up blood.  You feel dizzy, faint, or experience extreme fatigue.  You develop severe weakness, dizziness, fainting, or chills. Any of these symptoms may represent a serious problem that is an emergency. Do not wait to see if the symptoms will go away. Call your local emergency services (911 in the [place] Do not drive yourself to the hospital. MAKE SURE YOU:  Understand these instructions.  Will watch your condition.  Will get help right away if you are not doing well or get worse.   This information is not intended to replace advice given to you by your health care provider. Make sure you discuss any questions you have with your health care provider.   Document Released: 11/28/2010 Document Revised: 10/31/2013 Document Reviewed: 04/27/2013 Elsevier Interactive Patient  Education 2016 [employer]. Aspirin and Your Heart  Aspirin is a medicine that affects the way blood clots. Aspirin can be used to help reduce the risk of blood clots, heart attacks, and other heart-related problems.  SHOULD I TAKE ASPIRIN? Your health care provider will help you determine whether it is safe and  beneficial for you to take aspirin daily. Taking aspirin daily may be beneficial if you:  Have had a heart attack or chest pain.  Have undergone open heart surgery such as coronary artery bypass surgery (CABG).  Have had coronary angioplasty.  Have experienced a stroke or transient ischemic attack (TIA).  Have peripheral vascular disease (PVD).  Have chronic heart rhythm problems such as atrial fibrillation. ARE THERE ANY RISKS OF TAKING ASPIRIN DAILY? Daily use of aspirin can increase your risk of side effects. Some of these include:  Bleeding. Bleeding problems can be minor or serious. An example of a minor problem is a cut that does not stop bleeding. An example of a more serious problem is stomach bleeding or bleeding into the brain. Your risk of bleeding is increased if you are also taking non-steroidal anti-inflammatory medicine (NSAIDs).  Increased bruising.  Upset stomach.  An allergic reaction. People who have nasal polyps have an increased risk of developing an aspirin allergy. WHAT ARE SOME GUIDELINES I SHOULD FOLLOW WHEN TAKING ASPIRIN?   Take aspirin only as directed by your health care provider. Make sure you understand how much you should take and what form you should take. The two forms of aspirin are:  Non-enteric-coated. This type of aspirin does not have a coating and is absorbed quickly. Non-enteric-coated aspirin is usually recommended for people with chest pain. This type of aspirin also comes in a chewable form.  Enteric-coated. This type of aspirin has a special coating that releases the medicine very slowly. Enteric-coated aspirin causes less stomach upset than non-enteric-coated aspirin. This type of aspirin should not be chewed or crushed.  Drink alcohol in moderation. Drinking alcohol increases your risk of bleeding. WHEN SHOULD I SEEK MEDICAL CARE?   You have unusual bleeding or bruising.  You have stomach pain.  You have an allergic reaction.  Symptoms of an allergic reaction include:  Hives.  Itchy skin.  Swelling of the lips, tongue, or face.  You have ringing in your ears. WHEN SHOULD I SEEK IMMEDIATE MEDICAL CARE?   Your bowel movements are bloody, dark red, or black in color.  You vomit or cough up blood.  You have blood in your urine.  You cough, wheeze, or feel short of breath. If you have any of the following symptoms, this is an emergency. Do not wait to see if the pain will go away. Get medical help at once. Call your local emergency services (911 in the [place] Do not drive yourself to the hospital.  You have severe chest pain, especially if the pain is crushing or pressure-like and spreads to the arms, back, neck, or jaw.  You have stroke-like symptoms, such as:   Loss of vision.   Difficulty talking.   Numbness or weakness on one side of your body.   Numbness or weakness in your arm or leg.   Not thinking clearly or feeling confused.    This information is not intended to replace advice given to you by your health care provider. Make sure you discuss any questions you have with your health care provider.   Document Released: 10/08/2008 Document Revised: 11/16/2014 Document Reviewed: 01/31/2014 Elsevier  Interactive Patient Education 2016 [employer]. Clopidogrel tablets What is this medicine? CLOPIDOGREL (kloh PID oh grel) helps to prevent blood clots. This medicine is used to prevent heart attack, stroke, or other vascular events in people who are at high risk. This medicine may be used for other purposes; ask your health care provider or pharmacist if you have questions. What should I tell my health care provider before I take this medicine? They need to know if you have any of the following conditions: -bleeding disorder -bleeding in the brain -planned surgery -stomach or intestinal ulcers -stroke or transient ischemic attack -an unusual or allergic reaction to clopidogrel, other  medicines, foods, dyes, or preservatives -pregnant or trying to get pregnant -breast-feeding How should I use this medicine? Take this medicine by mouth with a drink of water. Follow the directions on the prescription label. You may take this medicine with or without food. Take your medicine at regular intervals. Do not take your medicine more often than directed. Talk to your pediatrician regarding the use of this medicine in children. Special care may be needed. Overdosage: If you think you have taken too much of this medicine contact a poison control center or emergency room at once. NOTE: This medicine is only for you. Do not share this medicine with others. What if I miss a dose? If you miss a dose, take it as soon as you can. If it is almost time for your next dose, take only that dose. Do not take double or extra doses. What may interact with this medicine? -aspirin -blood thinners like cilostazol, enoxaparin, ticlopidine, and warfarin -certain medicines for depression like citalopram, fluoxetine, and fluvoxamine -certain medicines for fungal infections like ketoconazole, fluconazole, and voriconazole -certain medicines for HIV infection like delavirdine, efavirenz, and etravirine -certain medicines for seizures like felbamate, oxcarbazepine, and phenytoin -chloramphenicol -fluvastatin -isoniazid, INH -medicines for inflammation like ibuprofen and naproxen -modafinil -nicardipine -over-the counter supplements like echinacea, feverfew, fish oil, garlic, ginger, ginkgo, green tea, horse chestnut -quinine -stomach acid blockers like cimetidine, omeprazole, and esomeprazole -tamoxifen -tolbutamide -topiramate -torsemide This list may not describe all possible interactions. Give your health care provider a list of all the medicines, herbs, non-prescription drugs, or dietary supplements you use. Also tell them if you smoke, drink alcohol, or use illegal drugs. Some items may interact  with your medicine. What should I watch for while using this medicine? Visit your doctor or health care professional for regular check ups. Do not stop taking your medicine unless your doctor tells you to. Notify your doctor or health care professional and seek emergency treatment if you develop breathing problems; changes in vision; chest pain; severe, sudden headache; pain, swelling, warmth in the leg; trouble speaking; sudden numbness or weakness of the face, arm or leg. These can be signs that your condition has gotten worse. If you are going to have surgery or dental work, tell your doctor or health care professional that you are taking this medicine. Certain genetic factors may reduce the effect of this medicine. Your doctor may use genetic tests to determine treatment. What side effects may I notice from receiving this medicine? Side effects that you should report to your doctor or health care professional as soon as possible: -allergic reactions like skin rash, itching or hives, swelling of the face, lips, or tongue -breathing problems -changes in vision -fever -signs and symptoms of bleeding such as bloody or black, tarry stools; red or dark-brown urine; spitting up blood or brown material that  looks like coffee grounds; red spots on the skin; unusual bruising or bleeding from the eye, gums, or nose -sudden weakness -unusual bleeding or bruising Side effects that usually do not require medical attention (report to your doctor or health care professional if they continue or are bothersome): -constipation or diarrhea -headache -pain in back or joints -stomach upset This list may not describe all possible side effects. Call your doctor for medical advice about side effects. You may report side effects to FDA at 1-800-FDA-1088. Where should I keep my medicine? Keep out of the reach of children. Store at room temperature of 59 to 86 degrees F (15 to 30 degrees C). Throw away any unused  medicine after the expiration date. NOTE: This sheet is a summary. It may not cover all possible information. If you have questions about this medicine, talk to your doctor, pharmacist, or health care provider.    2016, Elsevier/Gold Standard. (2013-02-21 16:34:37)

## 2016-06-15 NOTE — Plan of Care (Signed)
Problem: Education: Goal: Knowledge of [facility] Education information/materials will improve Outcome: Completed/Met Date Met: 06/15/16 Pt educated and verbalizes understanding

## 2016-06-15 NOTE — Discharge Summary (Signed)
Physician Discharge Summary  [name] [id number] DOB: 19-Nov-1928 DOA: 06/12/2016  PCP: [name], MD*  Admit date: 06/12/2016 Discharge date: 06/15/2016  Recommendations for Outpatient Follow-up:  1. Continue aspirin and plavix on discharge   Discharge Diagnoses:  Principal Problem:   Pain in the chest Active Problems:   Alcohol abuse   HYPERTENSION, BENIGN   Hypertrophic obstructive cardiomyopathy (HCC)   Alcoholic cirrhosis (HCC)   Elevated troponin   RBBB   Acute on chronic diastolic (congestive) heart failure (HCC)   Chest pain    Discharge Condition: stable   Diet recommendation: as tolerated   History of present illness:  82 year old male with a past medical history of HOCM, HTN, HLD, and ETOH abuse who presented to [facility] ED with chest pain and shortness of breath that awakened him from sleep. Pt was found to have troponin levels up 0.81. Cardio has seen him in consultation.   Hospital Course:   Assessment & Plan:   NSTEMI - Likely demand ischemia from renal insufficiency - Pt has no reports of chest pain  - Troponin level 0.42 --> 0.81 --> 0.75 - Myoview 06/13/2016 showed large region of infarcted myocardium involving the apex, inferior wall, septal wall and anterior wall. No reversible ischemia. Septal dyskinesia. Left ventricular ejection fraction 38% - Continue daily aspirin and plavix   Hyperkalemia - Treated with IV lasix and kayexalate  - Repeat level WNL  Dyslipidemia - Continue statin therapy   Essential hypertension - Continue norvasc, imdur, metoprolol   Acute on chronic diastolic heart failure - 2 D ECHO in 04/2016 showed EF 60% - Per cardio, continue lasix   Chronic kidney disease stage 3 - Baseline Cr 1.9 in 04/2016 - Cr within baseline range on this admission ( 1.6 - 1.8)   Anemia of chronic kidney disease - Hgb stable   DVT prophylaxis: SCD's bilaterally  Code Status: full code  Family Communication: no family at the  bedside this am; called his son over the phone and left VM with update and my cell number to call me back for questions     Consultants:   Cardiology   Procedures:   Nuclear stress test 8/5/20017 - Large region of infarcted myocardium involving the apex, inferior wall, septal wall and anterior wall. No reversible ischemia. Septal dyskinesia. Left ventricular ejection fraction 38%  Antimicrobials:   None     Signed:  Leisa Lenz, MD  [facility] 06/15/2016, 11:17 AM  Pager #: [phone]  Time spent in minutes: less than 30 minutes  Discharge Exam: Vitals:   06/14/16 2100 06/15/16 0500  BP: (!) 131/57 (!) 124/56  Pulse: 88 79  Resp: (!) 21 19  Temp: 98.9 F (37.2 C) 98.7 F (37.1 C)   Vitals:   06/14/16 1734 06/14/16 2100 06/15/16 0500 06/15/16 0652  BP: (!) 124/59 (!) 131/57 (!) 124/56   Pulse: 83 88 79   Resp:  (!) 21 19   Temp: 98 F (36.7 C) 98.9 F (37.2 C) 98.7 F (37.1 C)   TempSrc: Oral     SpO2: 97% 95% 95%   Weight:    75.4 kg (166 lb 4.8 oz)  Height:        General: Pt is alert, follows commands appropriately, not in acute distress Cardiovascular: Regular rate and rhythm, S1/S2 +, SEM appreciated  Respiratory: Clear to auscultation bilaterally, no wheezing, no crackles, no rhonchi Abdominal: Soft, non tender, non distended, bowel sounds +, no guarding Extremities: +3 LE  edema, pulses palpable bilaterally DP and PT Neuro: Grossly nonfocal  Discharge Instructions  Discharge Instructions    Call MD for:  difficulty breathing, headache or visual disturbances    Complete by:  As directed   Call MD for:  persistant nausea and vomiting    Complete by:  As directed   Call MD for:  severe uncontrolled pain    Complete by:  As directed   Diet - low sodium heart healthy    Complete by:  As directed   Increase activity slowly    Complete by:  As directed       Medication List    TAKE these medications   acetaminophen 325 MG  tablet Commonly known as:  TYLENOL Take 650 mg by mouth every 6 (six) hours as needed for pain.   ALPRAZolam 0.25 MG tablet Commonly known as:  XANAX Take 1 tablet (0.25 mg total) by mouth at bedtime as needed for anxiety.   amLODipine 10 MG tablet Commonly known as:  NORVASC Take 1 tablet (10 mg total) by mouth daily.   aspirin 81 MG EC tablet Take 1 tablet (81 mg total) by mouth daily.   clopidogrel 75 MG tablet Commonly known as:  PLAVIX Take 1 tablet (75 mg total) by mouth daily.   furosemide 40 MG tablet Commonly known as:  LASIX Take 1 tablet (40 mg total) by mouth daily.   isosorbide mononitrate 30 MG 24 hr tablet Commonly known as:  IMDUR TAKE ONE TABLET EACH DAY   metoprolol succinate 25 MG 24 hr tablet Commonly known as:  TOPROL-XL Take 3 tablets (75 mg total) by mouth daily. Take with or immediately following a meal. What changed:  See the new instructions.   multivitamin with minerals Tabs tablet Take 1 tablet by mouth daily.   pantoprazole 40 MG tablet Commonly known as:  PROTONIX Take 1 tablet (40 mg total) by mouth 2 (two) times daily.   simvastatin 20 MG tablet Commonly known as:  ZOCOR TAKE ONE TABLET AT BEDTIME   zolpidem 5 MG tablet Commonly known as:  AMBIEN TAKE ONE TABLET AT BEDTIME AS NEEDED FORSLEEP      Follow-up Information    [name], MD. Schedule an appointment as soon as possible for a visit in 1 week(s).   Specialties:  Internal Medicine, Radiology Contact information: [place]  [id number] [phone]            The results of significant diagnostics from this hospitalization (including imaging, microbiology, ancillary and laboratory) are listed below for reference.    Significant Diagnostic Studies: Dg Chest 2 View  Result Date: 06/12/2016 CLINICAL DATA:  82 year old male with chest pain and shortness of breath EXAM: CHEST  2 VIEW COMPARISON:  Chest radiograph dated 05/28/2016 FINDINGS:  Shallow inspiration. There is eventration of the right hemidiaphragm with atelectatic changes of the right lung base. Patchy area of hazy density at the left lung base may represent atelectasis versus infiltrate. There is no pneumothorax. Stable enlarged cardiac silhouette. No acute osseous pathology. IMPRESSION: Patchy left lung base airspace opacity, atelectasis versus infiltrate. Clinical correlation and follow-up recommended. Electronically Signed   By: Anner Crete M.D.   On: 06/12/2016 06:47   Dg Chest 2 View  Result Date: 05/29/2016 CLINICAL DATA:  82 year old male with history of crackles in the left lung on physical examination. EXAM: CHEST  2 VIEW COMPARISON:  Chest x-ray 04/29/2016. FINDINGS: Chronic elevation of the right hemidiaphragm. Low lung  volumes. Some bibasilar linear opacities are noted, similar to the prior study, favored to represent areas of mild chronic scarring and/or subsegmental atelectasis. No definite consolidative airspace disease. No pleural effusions. No evidence of pulmonary edema. Heart size is normal. Upper mediastinal contours are within normal limits. Atherosclerosis in the thoracic aorta. IMPRESSION: 1. Low lung volumes with mild bibasilar subsegmental atelectasis and/or scarring. No radiographic evidence of acute cardiopulmonary disease. 2. Chronic elevation of the right hemidiaphragm is unchanged. 3. Aortic atherosclerosis. Electronically Signed   By: Vinnie Langton M.D.   On: 05/29/2016 07:09   Nm Myocar Multi W/spect W/wall Motion / Ef  Result Date: 06/13/2016 CLINICAL DATA:  82 year old male with chest pain hypertension was short of breath. EXAM: MYOCARDIAL IMAGING WITH SPECT (REST AND PHARMACOLOGIC-STRESS) GATED LEFT VENTRICULAR WALL MOTION STUDY LEFT VENTRICULAR EJECTION FRACTION TECHNIQUE: Standard myocardial SPECT imaging was performed after resting intravenous injection of 10 mCi Tc-90m tetrofosmin. Subsequently, intravenous infusion of Lexiscan was  performed under the supervision of the Cardiology staff. At peak effect of the drug, 30 mCi Tc-107m tetrofosmin was injected intravenously and standard myocardial SPECT imaging was performed. Quantitative gated imaging was also performed to evaluate left ventricular wall motion, and estimate left ventricular ejection fraction. COMPARISON:  None. FINDINGS: Perfusion: There is a large region of decreased profusion involving the apex, anterior, mid, and basilar segments of the inferior wall, septal wall and the anterior wall. This large defect is matched on rest and stress. Preserved profusion to the lateral wall. Wall Motion:  Septal dyskinesia. Left Ventricular Ejection Fraction: 38 % End diastolic volume 123XX123 ml End systolic volume 94 ml IMPRESSION: 1. Large region of infarcted myocardium involving the apex, inferior wall, septal wall and anterior wall. No reversible ischemia. 2. Septal dyskinesia. 3. Left ventricular ejection fraction 38% 4. Non invasive risk stratification*: Intermediate owing to LEFT ventricular dysfunction. *2012 Appropriate Use Criteria for Coronary Revascularization Focused Update: J Am Coll Cardiol. [id number]. [email or web address]://[email or web address] Electronically Signed   By: Suzy Bouchard M.D.   On: 06/13/2016 13:57    Microbiology: No results found for this or any previous visit (from the past 240 hour(s)).   Labs: Basic Metabolic Panel:  Recent Labs Lab 06/12/16 0556 06/12/16 0900 06/13/16 0035  NA 141 140 140  K 5.2* 5.0 4.2  CL 113* 115* 113*  CO2 19* 19* 19*  GLUCOSE 104* 97 141*  BUN 41* 39* 37*  CREATININE 1.88* 1.83* 1.67*  CALCIUM 8.5* 8.3* 8.5*   Liver Function Tests:  Recent Labs Lab 06/12/16 0556  AST 24  ALT 15*  ALKPHOS 85  BILITOT 0.6  PROT 6.0*  ALBUMIN 3.0*   No results for input(s): LIPASE, AMYLASE in the last 168 hours. No results for input(s): AMMONIA in the last 168 hours. CBC:  Recent Labs Lab  06/12/16 0556  WBC 8.3  NEUTROABS 5.8  HGB 10.3*  HCT 33.3*  MCV 99.7  PLT 229   Cardiac Enzymes:  Recent Labs Lab 06/12/16 0556 06/12/16 0900 06/12/16 1420 06/12/16 2014 06/13/16 0035  TROPONINI <0.03 0.05* 0.42* 0.81* 0.75*   BNP: BNP (last 3 results)  Recent Labs  04/13/16 1046 06/12/16 0556  BNP 107.2* 637.8*    ProBNP (last 3 results)  Recent Labs  07/22/15 1536  PROBNP 61.0    CBG: No results for input(s): GLUCAP in the last 168 hours.

## 2016-06-15 NOTE — Progress Notes (Signed)
Patient Name: [name] Date of Encounter: 06/15/2016  Principal Problem:   Pain in the chest Active Problems:   Alcohol abuse   HYPERTENSION, BENIGN   Hypertrophic obstructive cardiomyopathy (HCC)   Alcoholic cirrhosis (HCC)   Elevated troponin   RBBB   Acute on chronic diastolic (congestive) heart failure ([place])   Chest pain   Primary Cardiologist: Dr. Angelena [name] Patient Profile: [name] is a 82 year old male with a past medical history of HOCM, HTN, HLD, and ETOH abuse. Last Echo was in 07/2015 with moderate basal septal hypertrophy with no clear HOCM physiology, normal LV function. He presented to the ED on 06/12/16 with chest pain and SOB that awakened him from sleep.   SUBJECTIVE: Feels well, denies chest pain and SOB.   OBJECTIVE Vitals:   06/14/16 1734 06/14/16 2100 06/15/16 0500 06/15/16 0652  BP: (!) 124/59 (!) 131/57 (!) 124/56   Pulse: 83 88 79   Resp:  (!) 21 19   Temp: 98 F (36.7 C) 98.9 F (37.2 C) 98.7 F (37.1 C)   TempSrc: Oral     SpO2: 97% 95% 95%   Weight:    166 lb 4.8 oz (75.4 kg)  Height:        Intake/Output Summary (Last 24 hours) at 06/15/16 0903 Last data filed at 06/15/16 [id number]  Gross per 24 hour  Intake              120 ml  Output              325 ml  Net             -205 ml   Filed Weights   06/13/16 0500 06/14/16 0529 06/15/16 0652  Weight: 168 lb 12.8 oz (76.6 kg) 156 lb 4.8 oz (70.9 kg) 166 lb 4.8 oz (75.4 kg)    PHYSICAL EXAM General: Well developed, well nourished, male in no acute distress. Head: Normocephalic, atraumatic.  Neck: Supple without bruits, no JVD. Lungs:  Resp regular and unlabored, CTA. Heart: RRR, S1, S2, no S3, S4, or murmur; no rub. Abdomen: Soft, non-tender, non-distended, BS + x 4.  Extremities: No clubbing, cyanosis, 3+ pretibial and pedal edema.  Neuro: Alert and oriented X 3. Moves all extremities spontaneously. Psych: Normal affect.  LABS: Basic Metabolic Panel: Recent Labs   06/13/16 0035  NA 140  K 4.2  CL 113*  CO2 19*  GLUCOSE 141*  BUN 37*  CREATININE 1.67*  CALCIUM 8.5*   Cardiac Enzymes: Recent Labs  06/12/16 1420 06/12/16 2014 06/13/16 0035  TROPONINI 0.42* 0.81* 0.75*   BNP:  B Natriuretic Peptide  Date/Time Value Ref Range Status  06/12/2016 05:56 AM 637.8 (H) 0.0 - 100.0 pg/mL Final  04/13/2016 10:46 AM 107.2 (H) 0.0 - 100.0 pg/mL Final     Current Facility-Administered Medications:  .  0.9 %  sodium chloride infusion, 250 mL, Intravenous, PRN, Roxan Hockey, MD .  acetaminophen (TYLENOL) tablet 650 mg, 650 mg, Oral, Q6H PRN, 650 mg at 06/14/16 1112 **OR** acetaminophen (TYLENOL) suppository 650 mg, 650 mg, Rectal, Q6H PRN, Courage Emokpae, MD .  albuterol (PROVENTIL) (2.5 MG/3ML) 0.083% nebulizer solution 2.5 mg, 2.5 mg, Nebulization, Q2H PRN, Roxan Hockey, MD .  ALPRAZolam Duanne Moron) tablet 0.25 mg, 0.25 mg, Oral, QHS PRN, Roxan Hockey, MD .  amLODipine (NORVASC) tablet 10 mg, 10 mg, Oral, Daily, Roxan Hockey, MD, 10 mg at 06/15/16 0835 .  aspirin EC tablet 81 mg, 81 mg, Oral,  Daily, Roxan Hockey, MD, 81 mg at 06/15/16 0833 .  clopidogrel (PLAVIX) tablet 300 mg, 300 mg, Oral, Once, [place]ra [place], [place] .  clopidogrel (PLAVIX) tablet 75 mg, 75 mg, Oral, Daily, [place], [place], 75 mg at 06/15/16 0834 .  folic acid (FOLVITE) tablet 1 mg, 1 mg, Oral, Daily, Roxan Hockey, MD, 1 mg at 06/15/16 0834 .  furosemide (LASIX) tablet 40 mg, 40 mg, Oral, Daily, Troy Sine, MD, 40 mg at 06/15/16 [id number] .  isosorbide mononitrate (IMDUR) 24 hr tablet 30 mg, 30 mg, Oral, Daily, Roxan Hockey, MD, 30 mg at 06/15/16 0834 .  LORazepam (ATIVAN) tablet 1 mg, 1 mg, Oral, Q6H PRN **OR** LORazepam (ATIVAN) injection 1 mg, 1 mg, Intravenous, Q6H PRN, Roxan Hockey, MD .  metoprolol succinate (TOPROL-XL) 24 hr tablet 75 mg, 75 mg, Oral, Daily, [place], [place], 75 mg at 06/15/16 [id number] .  multivitamin with minerals tablet 1 tablet, 1 tablet, Oral, Daily,  Roxan Hockey, MD, 1 tablet at 06/15/16 0835 .  ondansetron (ZOFRAN) tablet 4 mg, 4 mg, Oral, Q6H PRN **OR** ondansetron (ZOFRAN) injection 4 mg, 4 mg, Intravenous, Q6H PRN, Roxan Hockey, MD .  oxyCODONE (Oxy IR/ROXICODONE) immediate release tablet 5 mg, 5 mg, Oral, Q4H PRN, Roxan Hockey, MD .  pantoprazole (PROTONIX) EC tablet 40 mg, 40 mg, Oral, BID, Roxan Hockey, MD, 40 mg at 06/15/16 0834 .  polyethylene glycol (MIRALAX / GLYCOLAX) packet 17 g, 17 g, Oral, Daily PRN, Roxan Hockey, MD .  senna (SENOKOT) tablet 8.6 mg, 1 tablet, Oral, BID, Roxan Hockey, MD, 8.6 mg at 06/15/16 0834 .  simvastatin (ZOCOR) tablet 20 mg, 20 mg, Oral, QHS, Roxan Hockey, MD, 20 mg at 06/14/16 2247 .  sodium chloride flush (NS) 0.9 % injection 3 mL, 3 mL, Intravenous, Q12H, Roxan Hockey, MD, 3 mL at 06/15/16 0835 .  sodium chloride flush (NS) 0.9 % injection 3 mL, 3 mL, Intravenous, PRN, Roxan Hockey, MD .  thiamine (VITAMIN B-1) tablet 100 mg, 100 mg, Oral, Daily, Roxan Hockey, MD, 100 mg at 06/15/16 0832 .  traZODone (DESYREL) tablet 50 mg, 50 mg, Oral, QHS PRN, Roxan Hockey, MD, 50 mg at 06/15/16 0018   TELE:  NSR      ECG: NSR, RBBB.  Radiology/Studies: Nm Myocar Multi W/spect W/wall Motion / Ef  Result Date: 06/13/2016 CLINICAL DATA:  82 year old male with chest pain hypertension was short of breath. EXAM: MYOCARDIAL IMAGING WITH SPECT (REST AND PHARMACOLOGIC-STRESS) GATED LEFT VENTRICULAR WALL MOTION STUDY LEFT VENTRICULAR EJECTION FRACTION TECHNIQUE: Standard myocardial SPECT imaging was performed after resting intravenous injection of 10 mCi Tc-77m tetrofosmin. Subsequently, intravenous infusion of Lexiscan was performed under the supervision of the Cardiology staff. At peak effect of the drug, 30 mCi Tc-83m tetrofosmin was injected intravenously and standard myocardial SPECT imaging was performed. Quantitative gated imaging was also performed to evaluate left ventricular wall  motion, and estimate left ventricular ejection fraction. COMPARISON:  None. FINDINGS: Perfusion: There is a large region of decreased profusion involving the apex, anterior, mid, and basilar segments of the inferior wall, septal wall and the anterior wall. This large defect is matched on rest and stress. Preserved profusion to the lateral wall. Wall Motion:  Septal dyskinesia. Left Ventricular Ejection Fraction: 38 % End diastolic volume 123XX123 ml End systolic volume 94 ml IMPRESSION: 1. Large region of infarcted myocardium involving the apex, inferior wall, septal wall and anterior wall. No reversible ischemia. 2. Septal dyskinesia. 3. Left ventricular ejection fraction 38% 4. Non invasive  risk stratification*: Intermediate owing to LEFT ventricular dysfunction. *2012 Appropriate Use Criteria for Coronary Revascularization Focused Update: J Am Coll Cardiol. [id number]. http://content.[email or web address].aspx?[email or web address] Electronically Signed   By: Suzy Bouchard M.D.   On: 06/13/2016 13:57     Current Medications:  . amLODipine  10 mg Oral Daily  . aspirin EC  81 mg Oral Daily  . clopidogrel  300 mg Oral Once  . clopidogrel  75 mg Oral Daily  . folic acid  1 mg Oral Daily  . furosemide  40 mg Oral Daily  . isosorbide mononitrate  30 mg Oral Daily  . metoprolol succinate  75 mg Oral Daily  . multivitamin with minerals  1 tablet Oral Daily  . pantoprazole  40 mg Oral BID  . senna  1 tablet Oral BID  . simvastatin  20 mg Oral QHS  . sodium chloride flush  3 mL Intravenous Q12H  . thiamine  100 mg Oral Daily      ASSESSMENT AND PLAN: Principal Problem:   Pain in the chest Active Problems:   Alcohol abuse   HYPERTENSION, BENIGN   Hypertrophic obstructive cardiomyopathy (HCC)   Alcoholic cirrhosis (HCC)   Elevated troponin   RBBB   Acute on chronic diastolic (congestive) heart failure (HCC)   Chest pain  1. NSTEMI: Nuclear MPI study showed large area of scar, no  ischemia, and reduced LVEF. Will obtain limited echo to assess LVEF, which had been normal earlier this year. Plavix added to medication regimen Toprol-XL increased  to 75 mg daily. Continue ASA and statin. Due to CKD, will not add ACEI/ARB.  Does not wish to further investigate ischemia with cath.   2. Acute on chronic diastolic heart failure: Will switch IV to oral Lasix   3. Essential HTN: Controlled.   4. Hyperlipidemia: On statin.  5. CKD III: Continue to monitor.   Signed, Arbutus Leas , NP 9:03 AM 06/15/2016 Pager [phone]  I have personally seen and examined this patient with Jettie Booze, NP. I agree with the assessment and plan as outlined above. He is admitted with chest pain. He is not known to have CAD but had no ischemic evaluation prior to admission. Nuclear stress test with possible old infarction but no ischemia. Echo pending today to assess regional wall motion, LVEF. Troponin elevated but pt refusing cath. He has memory issues. Exam otherwise shows well developed male with clear lungs bilaterally, RRR with harsh systolic murmur and 3+ LE edema.  He is refusing cardiac cath so will pursue medical management of NSTEMI with ASA, beta blocker and Plavix. He has chronic CHF with LE edema. Will continue Lasix 40 mg po daily given his renal insufficiency.  He has known Aortic stenosis, moderate.   Lauree [name] 06/15/2016 9:30 AM

## 2016-06-15 NOTE — Consult Note (Signed)
[facility] Nurse wound consult note Reason for Consult: Bilateral hyperkeratosis and chronic skin changes.  Patient is aware that he has chronic venous stasis and does not wish to wear Unnas boots or compression for lower extremity edema and dryness.  Wound type:Chronic venous stasis Pressure Ulcer POA: N/A Measurement:Generalized cracked, raised skin with some scabbing present.  NO drainage.  Wound [id number] and scabbed Drainage (amount, consistency, odor) NOne Periwound:Erythema (mild) and edema present to bilateral lower legs.  Dressing procedure/placement/frequency:Cleanse bilateral lower legs daily with soap and water.  APply AMlactin cream to lower legs.  Will not follow at this time.  Please re-consult if needed.  Domenic Moras RN BSN [place] Pager [phone]

## 2016-06-15 NOTE — Progress Notes (Signed)
*  PRELIMINARY RESULTS* Echocardiogram 2D Echocardiogram limited has been performed.  [name] 06/15/2016, 12:22 PM

## 2016-06-16 ENCOUNTER — Telehealth: Payer: Self-pay | Admitting: *Deleted

## 2016-06-16 NOTE — Telephone Encounter (Signed)
Transition Care Management Follow-up Telephone Call   Date discharged? 06/15/16   How have you been since you were released from the hospital? Pt states he is doing alright   Do you understand why you were in the hospital? YES   Do you understand the discharge instructions? YES   Where were you discharged to? Home   Items Reviewed:  Medications reviewed: YES  Allergies reviewed: YES  Dietary changes reviewed: YES  Referrals reviewed: No referral needed   Functional Questionnaire:   Activities of Daily Living (ADLs):   He states he are independent in the following: ambulation, bathing and hygiene, feeding, continence, grooming, toileting and dressing States he doesn't require assistance   Any transportation issues/concerns?: NO   Any patient concerns? NO   Confirmed importance and date/time of follow-up visits scheduled Yes, appt 06/30/16  Provider Appointment booked with Dr. [name]  Confirmed with patient if condition begins to worsen call PCP or go to the ER.  Patient was given the office number and encouraged to call back with question or concerns.  : YES

## 2016-06-17 ENCOUNTER — Telehealth: Payer: Self-pay | Admitting: Emergency Medicine

## 2016-06-17 ENCOUNTER — Emergency Department (HOSPITAL_COMMUNITY)
Admission: EM | Admit: 2016-06-17 | Discharge: 2016-06-17 | Disposition: A | Discharge status: Home / Self Care | Payer: Medicare Other | Attending: Emergency Medicine | Admitting: Emergency Medicine

## 2016-06-17 ENCOUNTER — Other Ambulatory Visit: Payer: Self-pay | Admitting: Internal Medicine

## 2016-06-17 ENCOUNTER — Encounter (HOSPITAL_COMMUNITY): Payer: Self-pay | Admitting: Emergency Medicine

## 2016-06-17 DIAGNOSIS — I5033 Acute on chronic diastolic (congestive) heart failure: Secondary | ICD-10-CM | POA: Diagnosis not present

## 2016-06-17 DIAGNOSIS — Z7902 Long term (current) use of antithrombotics/antiplatelets: Secondary | ICD-10-CM | POA: Insufficient documentation

## 2016-06-17 DIAGNOSIS — Z7982 Long term (current) use of aspirin: Secondary | ICD-10-CM | POA: Insufficient documentation

## 2016-06-17 DIAGNOSIS — I872 Venous insufficiency (chronic) (peripheral): Secondary | ICD-10-CM | POA: Diagnosis present

## 2016-06-17 DIAGNOSIS — N183 Chronic kidney disease, stage 3 (moderate): Secondary | ICD-10-CM | POA: Insufficient documentation

## 2016-06-17 DIAGNOSIS — I130 Hypertensive heart and chronic kidney disease with heart failure and stage 1 through stage 4 chronic kidney disease, or unspecified chronic kidney disease: Secondary | ICD-10-CM | POA: Insufficient documentation

## 2016-06-17 DIAGNOSIS — Z79899 Other long term (current) drug therapy: Secondary | ICD-10-CM | POA: Insufficient documentation

## 2016-06-17 MED ORDER — DOXYCYCLINE HYCLATE 100 MG PO TABS
100.0000 mg | ORAL_TABLET | ORAL | Status: AC
  Administered 2016-06-17: 100 mg via ORAL

## 2016-06-17 MED ORDER — DOXYCYCLINE HYCLATE 100 MG PO CAPS: 100.0000 mg | ORAL_CAPSULE | Freq: Two times a day (BID) | ORAL | 0 refills | Status: DC

## 2016-06-17 NOTE — ED Notes (Signed)
[facility] called to transport pt to [facility]

## 2016-06-17 NOTE — Telephone Encounter (Signed)
Spoke with Burnadette from [facility], they are asking for verbal orders for ADL, Wound care for both legs daily, and PRN ( access and treat), Continuing with current meds. Also need verbal orders for PT to access unsteady gait. Please advise.  Med list and H&P faxed to [facility].

## 2016-06-17 NOTE — Telephone Encounter (Signed)
Please give her a call back. She needs a doctors order for him to be moved over to the rehab unit for his safety. He was in the ER again last night. He is short of breath.  She has spoke to all of his children and they agree They need a copy of the [place] and current medication list. Please follow up thanks.

## 2016-06-17 NOTE — Telephone Encounter (Signed)
Tried returning call, unable to leave voicemail.

## 2016-06-17 NOTE — ED Notes (Signed)
Bed: WA01 Expected date:  Expected time:  Means of arrival:  Comments: [place]

## 2016-06-17 NOTE — Telephone Encounter (Signed)
Ok for verbal 

## 2016-06-17 NOTE — ED Provider Notes (Signed)
[facility] DEPT Provider Note   CSN: [id number] Arrival date & time: 06/17/16  [id number]  First Provider Contact:  First MD Initiated Contact with Patient 06/17/16 0015     By signing my name below, I, Ephriam Jenkins, attest that this documentation has been prepared under the direction and in the presence of Rubel Heckard, MD. Electronically signed, Ephriam Jenkins, ED Scribe. 06/17/16. 1:55 AM.   History   Chief Complaint No chief complaint on file.  HPI Comments: [name] is a 82 y.o. male with a PMHx of Dementia, Alcoholic cirrhosis, CKD, brought in by ambulance, who presents to the Emergency Department who presents to the ED from his living facility for wound care of Stasis Dermatitis on bilateral lower extremities. Pt states "I'm not here for weeping this time". Pt's wound not currently weeping. Pt denies any recent injury or fall. Pt further denies any recent fever, cough, chest pain, nausea, vomiting.    The history is provided by the patient. No language interpreter was used.   Past Medical History:  Diagnosis Date  . Alcoholic cirrhosis ([place])   . 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 Alcoholism ([place])   . Ascites 11/2004   "early" SBP on paracentesis.   . Chronic gastritis 05/01/2016  . CKD (chronic kidney disease), stage III 10/11/2015  . Colon polyp perhaps in 1990s   pathology/type not known  . Diverticulosis of colon perhaps in 1990s  . Fracture of wrist 2014 spring vs early summer   refused orthopods suggestion to set the bone.   Marland Kitchen GERD (gastroesophageal reflux disease)   . HLD (hyperlipidemia)   . HTN (hypertension)   . Hypertrophic cardiomyopathy ([place])   . Portal hypertensive gastropathy 2006  . Renal insufficiency   . Rhabdomyolysis 01/2005    Patient Active Problem List   Diagnosis Date Noted  . Chest pain 06/13/2016  . Acute on chronic diastolic (congestive) heart failure ([place])   . RBBB   . Abnormal lung sounds 05/28/2016  . Gastritis 05/02/2016  .  Elevated troponin 05/02/2016  . Demand ischemia of myocardium ([place]) 05/02/2016  . Acute blood loss anemia 05/02/2016  . Pain in the chest 05/02/2016  . Duodenal ulcer disease   . Melena   . Iron deficiency anemia due to chronic blood loss   . GI bleed 04/13/2016  . CKD (chronic kidney disease) stage 3, GFR 30-59 ml/min 10/11/2015  . Memory loss 10/04/2015  . DOE (dyspnea on exertion) 07/17/2015  . Hyperglycemia 02/23/2015  . Gross hematuria 08/23/2014  . Aortic stenosis, mild 09/13/2013  . Acute duodenal ulcer with hemorrhage, without mention of obstruction 08/04/2013  . Malignant melanoma of lower extremity or hip, left 03/17/2012  . Routine health maintenance 03/17/2012  . Alcoholic cirrhosis ([place]) 123456  . Portal hypertensive gastropathy 05/26/2011  . Edema 10/10/2010  . Alcohol abuse 09/05/2009  . Hyperlipidemia 06/23/2009  . HYPERTENSION, BENIGN 06/23/2009  . Hypertrophic obstructive cardiomyopathy ([place]) 06/23/2009    Past Surgical History:  Procedure Laterality Date  . BASAL CELL CARCINOMA EXCISION Left 11/2012   lower eyelid  . ESOPHAGOGASTRODUODENOSCOPY N/A 08/04/2013   Procedure: ESOPHAGOGASTRODUODENOSCOPY (EGD);  Surgeon: Ladene Artist, MD;  Location: [facility] ENDOSCOPY;  Service: Endoscopy;  Laterality: N/A;  . ESOPHAGOGASTRODUODENOSCOPY (EGD) WITH PROPOFOL N/A 05/01/2016   Procedure: ESOPHAGOGASTRODUODENOSCOPY (EGD) WITH PROPOFOL;  Surgeon: Jerene Bears, MD;  Location: [facility] ENDOSCOPY;  Service: Endoscopy;  Laterality: N/A;  . INGUINAL HERNIA REPAIR    . MELANOMA EXCISION Left 02/2011   malignant melanoma of  thigh       Home Medications    Prior to Admission medications   Medication Sig Start Date End Date Taking? Authorizing Provider  acetaminophen (TYLENOL) 325 MG tablet Take 650 mg by mouth every 6 (six) hours as needed for pain.    Historical Provider, MD  ALPRAZolam Duanne Moron) 0.25 MG tablet Take 1 tablet (0.25 mg total) by mouth at bedtime as needed for  anxiety. 05/19/16   Biagio Borg, MD  amLODipine (NORVASC) 10 MG tablet Take 1 tablet (10 mg total) by mouth daily. 01/27/16   Burnell Blanks, MD  aspirin EC 81 MG EC tablet Take 1 tablet (81 mg total) by mouth daily. 06/15/16   Robbie Lis, MD  clopidogrel (PLAVIX) 75 MG tablet Take 1 tablet (75 mg total) by mouth daily. 06/15/16   Robbie Lis, MD  furosemide (LASIX) 40 MG tablet Take 1 tablet (40 mg total) by mouth daily. 05/25/16   Biagio Borg, MD  isosorbide mononitrate (IMDUR) 30 MG 24 hr tablet TAKE ONE TABLET EACH DAY 06/02/16   Biagio Borg, MD  metoprolol succinate (TOPROL-XL) 25 MG 24 hr tablet Take 3 tablets (75 mg total) by mouth daily. Take with or immediately following a meal. 06/15/16   Robbie Lis, MD  Multiple Vitamin (MULTIVITAMIN WITH MINERALS) TABS Take 1 tablet by mouth daily.    Historical Provider, MD  pantoprazole (PROTONIX) 40 MG tablet Take 1 tablet (40 mg total) by mouth 2 (two) times daily. 05/02/16   Clanford Marisa Hua, MD  simvastatin (ZOCOR) 20 MG tablet TAKE ONE TABLET AT BEDTIME 03/06/16   Biagio Borg, MD  zolpidem (AMBIEN) 5 MG tablet TAKE ONE TABLET AT BEDTIME AS NEEDED FORSLEEP 05/25/16   Golden Circle, FNP    Family History Family History  Problem Relation Age of Onset  . Cancer Father 36    lung cancer  . Heart disease Mother   . Heart disease Sister     Social History Social History  Substance Use Topics  . Smoking status: Never Smoker  . Smokeless tobacco: Never Used  . Alcohol use No     Comment: member of AA     Allergies   Review of patient's allergies indicates no known allergies.   Review of Systems Review of Systems  Constitutional: Negative for fever.  Respiratory: Negative for cough.   Cardiovascular: Negative for chest pain.  Gastrointestinal: Negative for nausea and vomiting.  Skin: Positive for wound (bilateral LE).  All other systems reviewed and are negative.    Physical Exam Updated Vital Signs There were no  vitals taken for this visit.  Physical Exam  Constitutional: He appears well-developed and well-nourished.  HENT:  Head: Normocephalic.  Mouth/Throat: Oropharynx is clear and moist. No oropharyngeal exudate.  Eyes: Conjunctivae and EOM are normal. Pupils are equal, round, and reactive to light. Right eye exhibits no discharge. Left eye exhibits no discharge. No scleral icterus.  Neck: Normal range of motion. Neck supple. No JVD present. No tracheal deviation present.  Trachea is midline. No stridor or carotid bruits.  Cardiovascular: Normal rate, regular rhythm, normal heart sounds and intact distal pulses.   No murmur heard. Pulmonary/Chest: Effort normal and breath sounds normal. No stridor. No respiratory distress. He has no wheezes. He has no rales.  Lungs CTA bilaterally.   Abdominal: Soft. Bowel sounds are normal. He exhibits no distension. There is no tenderness. There is no rebound and no guarding.  Musculoskeletal: Normal  range of motion. He exhibits no tenderness.  Stasis dermatitis with crusting  Lymphadenopathy:    He has no cervical adenopathy.  Neurological: He is alert. He has normal reflexes.  Skin: Skin is warm. Capillary refill takes less than 2 seconds.  Psychiatric: He has a normal mood and affect. His behavior is normal.  Nursing note and vitals reviewed.    ED Treatments / Results  DIAGNOSTIC STUDIES: Results for orders placed or performed during the hospital encounter of 06/12/16  NM Myocar Multi W/Spect W/Wall Motion / EF  Result Value Ref Range   Rest HR 93 bpm   Rest BP 148/66 mmHg   Exercise duration (sec) 0 sec   Percent HR 76 %   Exercise duration (min) 0 min   Estimated workload 1.0 METS   Peak HR 103 bpm   Peak BP 158/68 mmHg   MPHR 135 bpm  CBC with Differential/Platelet  Result Value Ref Range   WBC 8.3 4.0 - 10.5 K/uL   RBC 3.34 (L) 4.22 - 5.81 MIL/uL   Hemoglobin 10.3 (L) 13.0 - 17.0 g/dL   HCT 33.3 (L) 39.0 - 52.0 %   MCV 99.7 78.0 -  100.0 fL   MCH 30.8 26.0 - 34.0 pg   MCHC 30.9 30.0 - 36.0 g/dL   RDW 15.6 (H) 11.5 - 15.5 %   Platelets 229 150 - 400 K/uL   Neutrophils Relative % 70 %   Neutro Abs 5.8 1.7 - 7.7 K/uL   Lymphocytes Relative 8 %   Lymphs Abs 0.7 0.7 - 4.0 K/uL   Monocytes Relative 14 %   Monocytes Absolute 1.2 (H) 0.1 - 1.0 K/uL   Eosinophils Relative 8 %   Eosinophils Absolute 0.7 0.0 - 0.7 K/uL   Basophils Relative 0 %   Basophils Absolute 0.0 0.0 - 0.1 K/uL  Comprehensive metabolic panel  Result Value Ref Range   Sodium 141 135 - 145 mmol/L   Potassium 5.2 (H) 3.5 - 5.1 mmol/L   Chloride 113 (H) 101 - 111 mmol/L   CO2 19 (L) 22 - 32 mmol/L   Glucose, Bld 104 (H) 65 - 99 mg/dL   BUN 41 (H) 6 - 20 mg/dL   Creatinine, Ser 1.88 (H) 0.61 - 1.24 mg/dL   Calcium 8.5 (L) 8.9 - 10.3 mg/dL   Total Protein 6.0 (L) 6.5 - 8.1 g/dL   Albumin 3.0 (L) 3.5 - 5.0 g/dL   AST 24 15 - 41 U/L   ALT 15 (L) 17 - 63 U/L   Alkaline Phosphatase 85 38 - 126 U/L   Total Bilirubin 0.6 0.3 - 1.2 mg/dL   GFR calc non Af Amer 31 (L) >60 mL/min   GFR calc Af Amer 36 (L) >60 mL/min   Anion gap 9 5 - 15  Troponin I  Result Value Ref Range   Troponin I <0.03 <0.03 ng/mL  Brain natriuretic peptide  Result Value Ref Range   B Natriuretic Peptide 637.8 (H) 0.0 - 100.0 pg/mL  Protime-INR  Result Value Ref Range   Prothrombin Time 13.5 11.4 - 15.2 seconds   INR 1.03   Troponin I  Result Value Ref Range   Troponin I 0.05 (HH) <0.03 ng/mL  Basic metabolic panel  Result Value Ref Range   Sodium 140 135 - 145 mmol/L   Potassium 5.0 3.5 - 5.1 mmol/L   Chloride 115 (H) 101 - 111 mmol/L   CO2 19 (L) 22 - 32 mmol/L   Glucose, Bld  97 65 - 99 mg/dL   BUN 39 (H) 6 - 20 mg/dL   Creatinine, Ser 1.83 (H) 0.61 - 1.24 mg/dL   Calcium 8.3 (L) 8.9 - 10.3 mg/dL   GFR calc non Af Amer 32 (L) >60 mL/min   GFR calc Af Amer 37 (L) >60 mL/min   Anion gap 6 5 - 15  Troponin I (q 6hr x 3)  Result Value Ref Range   Troponin I 0.42 (HH)  <0.03 ng/mL  Troponin I (q 6hr x 3)  Result Value Ref Range   Troponin I 0.81 (HH) <0.03 ng/mL  Troponin I (q 6hr x 3)  Result Value Ref Range   Troponin I 0.75 (HH) <0.03 ng/mL  Basic metabolic panel  Result Value Ref Range   Sodium 140 135 - 145 mmol/L   Potassium 4.2 3.5 - 5.1 mmol/L   Chloride 113 (H) 101 - 111 mmol/L   CO2 19 (L) 22 - 32 mmol/L   Glucose, Bld 141 (H) 65 - 99 mg/dL   BUN 37 (H) 6 - 20 mg/dL   Creatinine, Ser 1.67 (H) 0.61 - 1.24 mg/dL   Calcium 8.5 (L) 8.9 - 10.3 mg/dL   GFR calc non Af Amer 36 (L) >60 mL/min   GFR calc Af Amer 41 (L) >60 mL/min   Anion gap 8 5 - 15  ECHOCARDIOGRAM LIMITED  Result Value Ref Range   Weight 2,660.8 oz   Height 68 in   BP 124/56 mmHg   Dg Chest 2 View  Result Date: 06/12/2016 CLINICAL DATA:  82 year old male with chest pain and shortness of breath EXAM: CHEST  2 VIEW COMPARISON:  Chest radiograph dated 05/28/2016 FINDINGS: Shallow inspiration. There is eventration of the right hemidiaphragm with atelectatic changes of the right lung base. Patchy area of hazy density at the left lung base may represent atelectasis versus infiltrate. There is no pneumothorax. Stable enlarged cardiac silhouette. No acute osseous pathology. IMPRESSION: Patchy left lung base airspace opacity, atelectasis versus infiltrate. Clinical correlation and follow-up recommended. Electronically Signed   By: Anner Crete M.D.   On: 06/12/2016 06:47   Dg Chest 2 View  Result Date: 05/29/2016 CLINICAL DATA:  82 year old male with history of crackles in the left lung on physical examination. EXAM: CHEST  2 VIEW COMPARISON:  Chest x-ray 04/29/2016. FINDINGS: Chronic elevation of the right hemidiaphragm. Low lung volumes. Some bibasilar linear opacities are noted, similar to the prior study, favored to represent areas of mild chronic scarring and/or subsegmental atelectasis. No definite consolidative airspace disease. No pleural effusions. No evidence of pulmonary  edema. Heart size is normal. Upper mediastinal contours are within normal limits. Atherosclerosis in the thoracic aorta. IMPRESSION: 1. Low lung volumes with mild bibasilar subsegmental atelectasis and/or scarring. No radiographic evidence of acute cardiopulmonary disease. 2. Chronic elevation of the right hemidiaphragm is unchanged. 3. Aortic atherosclerosis. Electronically Signed   By: Vinnie Langton M.D.   On: 05/29/2016 07:09   Nm Myocar Multi W/spect W/wall Motion / Ef  Result Date: 06/13/2016 CLINICAL DATA:  82 year old male with chest pain hypertension was short of breath. EXAM: MYOCARDIAL IMAGING WITH SPECT (REST AND PHARMACOLOGIC-STRESS) GATED LEFT VENTRICULAR WALL MOTION STUDY LEFT VENTRICULAR EJECTION FRACTION TECHNIQUE: Standard myocardial SPECT imaging was performed after resting intravenous injection of 10 mCi Tc-32m tetrofosmin. Subsequently, intravenous infusion of Lexiscan was performed under the supervision of the Cardiology staff. At peak effect of the drug, 30 mCi Tc-65m tetrofosmin was injected intravenously and standard myocardial SPECT imaging was  performed. Quantitative gated imaging was also performed to evaluate left ventricular wall motion, and estimate left ventricular ejection fraction. COMPARISON:  None. FINDINGS: Perfusion: There is a large region of decreased profusion involving the apex, anterior, mid, and basilar segments of the inferior wall, septal wall and the anterior wall. This large defect is matched on rest and stress. Preserved profusion to the lateral wall. Wall Motion:  Septal dyskinesia. Left Ventricular Ejection Fraction: 38 % End diastolic volume 123XX123 ml End systolic volume 94 ml IMPRESSION: 1. Large region of infarcted myocardium involving the apex, inferior wall, septal wall and anterior wall. No reversible ischemia. 2. Septal dyskinesia. 3. Left ventricular ejection fraction 38% 4. Non invasive risk stratification*: Intermediate owing to LEFT ventricular  dysfunction. *2012 Appropriate Use Criteria for Coronary Revascularization Focused Update: J Am Coll Cardiol. [id number]. http://content.airportbarriers.com.aspx?articleid=1201161 Electronically Signed   By: Suzy Bouchard M.D.   On: 06/13/2016 13:57    COORDINATION OF CARE:  Labs (all labs ordered are listed, but only abnormal results are displayed) Labs Reviewed - No data to display  EKG  EKG Interpretation None       Radiology No results found.  Procedures Procedures (including critical care time)  Medications Ordered in ED Medications - No data to display   Initial Impression / Assessment and Plan / ED Course  I have reviewed the triage vital signs and the nursing notes.  Pertinent labs & imaging results that were available during my care of the patient were reviewed by me and considered in my medical decision making (see chart for details).  Clinical Course      Final Clinical Impressions(s) / ED Diagnoses   Final diagnoses:  None   All questions answered to patient's satisfaction. Based on history and exam patient has been appropriately medically screened and emergency conditions excluded. Patient is stable for discharge at this time. Follow up with your PMDfor recheck in 2 daysand strict return precautions given. New Prescriptions New Prescriptions   No medications on file  I personally performed the services described in this documentation, which was scribed in my presence. The recorded information has been reviewed and is accurate.       Veatrice Kells, MD 06/17/16 [phone]

## 2016-06-17 NOTE — Telephone Encounter (Signed)
Notified. 

## 2016-06-17 NOTE — Telephone Encounter (Signed)
[facility] called back about this. States they never had a missed call. Please call back thanks.

## 2016-06-19 ENCOUNTER — Inpatient Hospital Stay: Payer: Medicare Other | Admitting: Internal Medicine

## 2016-06-19 ENCOUNTER — Telehealth: Payer: Self-pay

## 2016-06-19 NOTE — Telephone Encounter (Signed)
All the other and such that was sent to [facility] needs to be faxed over to [phone] Attn: RN He has moved from there to the [facility]. And they stated they faxed over this paper work yesterday.

## 2016-06-19 NOTE — Telephone Encounter (Signed)
This has been done.

## 2016-06-23 ENCOUNTER — Inpatient Hospital Stay (HOSPITAL_COMMUNITY)
Admission: EM | Admit: 2016-06-23 | Discharge: 2016-07-02 | DRG: 189 | Disposition: A | Discharge status: Skilled Nursing Facility | Payer: Medicare Other | Attending: Internal Medicine | Admitting: Internal Medicine

## 2016-06-23 ENCOUNTER — Emergency Department (HOSPITAL_COMMUNITY): Payer: Medicare Other

## 2016-06-23 ENCOUNTER — Encounter (HOSPITAL_COMMUNITY): Payer: Self-pay

## 2016-06-23 ENCOUNTER — Inpatient Hospital Stay: Payer: Medicare Other | Admitting: Internal Medicine

## 2016-06-23 DIAGNOSIS — Z7902 Long term (current) use of antithrombotics/antiplatelets: Secondary | ICD-10-CM

## 2016-06-23 DIAGNOSIS — R413 Other amnesia: Secondary | ICD-10-CM | POA: Diagnosis not present

## 2016-06-23 DIAGNOSIS — E785 Hyperlipidemia, unspecified: Secondary | ICD-10-CM | POA: Diagnosis present

## 2016-06-23 DIAGNOSIS — K7030 Alcoholic cirrhosis of liver without ascites: Secondary | ICD-10-CM | POA: Diagnosis present

## 2016-06-23 DIAGNOSIS — J9601 Acute respiratory failure with hypoxia: Principal | ICD-10-CM | POA: Diagnosis present

## 2016-06-23 DIAGNOSIS — I10 Essential (primary) hypertension: Secondary | ICD-10-CM | POA: Diagnosis present

## 2016-06-23 DIAGNOSIS — K2950 Unspecified chronic gastritis without bleeding: Secondary | ICD-10-CM | POA: Diagnosis present

## 2016-06-23 DIAGNOSIS — I4510 Unspecified right bundle-branch block: Secondary | ICD-10-CM | POA: Diagnosis present

## 2016-06-23 DIAGNOSIS — R7989 Other specified abnormal findings of blood chemistry: Secondary | ICD-10-CM

## 2016-06-23 DIAGNOSIS — I421 Obstructive hypertrophic cardiomyopathy: Secondary | ICD-10-CM | POA: Diagnosis present

## 2016-06-23 DIAGNOSIS — R600 Localized edema: Secondary | ICD-10-CM | POA: Diagnosis not present

## 2016-06-23 DIAGNOSIS — I472 Ventricular tachycardia: Secondary | ICD-10-CM | POA: Diagnosis not present

## 2016-06-23 DIAGNOSIS — F1010 Alcohol abuse, uncomplicated: Secondary | ICD-10-CM | POA: Diagnosis not present

## 2016-06-23 DIAGNOSIS — Z7982 Long term (current) use of aspirin: Secondary | ICD-10-CM | POA: Diagnosis not present

## 2016-06-23 DIAGNOSIS — D500 Iron deficiency anemia secondary to blood loss (chronic): Secondary | ICD-10-CM | POA: Diagnosis present

## 2016-06-23 DIAGNOSIS — N183 Chronic kidney disease, stage 3 unspecified: Secondary | ICD-10-CM | POA: Diagnosis present

## 2016-06-23 DIAGNOSIS — I2510 Atherosclerotic heart disease of native coronary artery without angina pectoris: Secondary | ICD-10-CM | POA: Diagnosis present

## 2016-06-23 DIAGNOSIS — D509 Iron deficiency anemia, unspecified: Secondary | ICD-10-CM | POA: Diagnosis present

## 2016-06-23 DIAGNOSIS — I509 Heart failure, unspecified: Secondary | ICD-10-CM

## 2016-06-23 DIAGNOSIS — I350 Nonrheumatic aortic (valve) stenosis: Secondary | ICD-10-CM

## 2016-06-23 DIAGNOSIS — N179 Acute kidney failure, unspecified: Secondary | ICD-10-CM | POA: Diagnosis present

## 2016-06-23 DIAGNOSIS — Z66 Do not resuscitate: Secondary | ICD-10-CM | POA: Diagnosis present

## 2016-06-23 DIAGNOSIS — I5041 Acute combined systolic (congestive) and diastolic (congestive) heart failure: Secondary | ICD-10-CM

## 2016-06-23 DIAGNOSIS — Z85820 Personal history of malignant melanoma of skin: Secondary | ICD-10-CM

## 2016-06-23 DIAGNOSIS — J189 Pneumonia, unspecified organism: Secondary | ICD-10-CM | POA: Diagnosis present

## 2016-06-23 DIAGNOSIS — Z79899 Other long term (current) drug therapy: Secondary | ICD-10-CM

## 2016-06-23 DIAGNOSIS — Y95 Nosocomial condition: Secondary | ICD-10-CM | POA: Diagnosis present

## 2016-06-23 DIAGNOSIS — Z8249 Family history of ischemic heart disease and other diseases of the circulatory system: Secondary | ICD-10-CM

## 2016-06-23 DIAGNOSIS — R4189 Other symptoms and signs involving cognitive functions and awareness: Secondary | ICD-10-CM | POA: Diagnosis not present

## 2016-06-23 DIAGNOSIS — K219 Gastro-esophageal reflux disease without esophagitis: Secondary | ICD-10-CM | POA: Diagnosis present

## 2016-06-23 DIAGNOSIS — I872 Venous insufficiency (chronic) (peripheral): Secondary | ICD-10-CM | POA: Diagnosis present

## 2016-06-23 DIAGNOSIS — I214 Non-ST elevation (NSTEMI) myocardial infarction: Secondary | ICD-10-CM | POA: Diagnosis present

## 2016-06-23 DIAGNOSIS — I130 Hypertensive heart and chronic kidney disease with heart failure and stage 1 through stage 4 chronic kidney disease, or unspecified chronic kidney disease: Secondary | ICD-10-CM | POA: Diagnosis present

## 2016-06-23 DIAGNOSIS — I5043 Acute on chronic combined systolic (congestive) and diastolic (congestive) heart failure: Secondary | ICD-10-CM | POA: Diagnosis present

## 2016-06-23 DIAGNOSIS — R778 Other specified abnormalities of plasma proteins: Secondary | ICD-10-CM | POA: Diagnosis present

## 2016-06-23 HISTORY — DX: Nonrheumatic aortic (valve) stenosis: I35.0

## 2016-06-23 HISTORY — DX: Acute combined systolic (congestive) and diastolic (congestive) heart failure: I50.41

## 2016-06-23 HISTORY — DX: Acute respiratory failure, unspecified whether with hypoxia or hypercapnia: J96.00

## 2016-06-23 LAB — URINALYSIS, ROUTINE W REFLEX MICROSCOPIC
Bilirubin Urine: NEGATIVE
Glucose, UA: NEGATIVE mg/dL
Hgb urine dipstick: NEGATIVE
Ketones, ur: NEGATIVE mg/dL
Leukocytes, UA: NEGATIVE
NITRITE: NEGATIVE
PROTEIN: 100 mg/dL — AB
Specific Gravity, Urine: 1.023 (ref 1.005–1.030)
pH: 5 (ref 5.0–8.0)

## 2016-06-23 LAB — BASIC METABOLIC PANEL
ANION GAP: 11 (ref 5–15)
BUN: 40 mg/dL — ABNORMAL HIGH (ref 6–20)
CALCIUM: 9 mg/dL (ref 8.9–10.3)
CO2: 18 mmol/L — ABNORMAL LOW (ref 22–32)
Chloride: 109 mmol/L (ref 101–111)
Creatinine, Ser: 1.84 mg/dL — ABNORMAL HIGH (ref 0.61–1.24)
GFR calc Af Amer: 37 mL/min — ABNORMAL LOW (ref >60)
GFR, EST NON AFRICAN AMERICAN: 32 mL/min — AB (ref >60)
GLUCOSE: 135 mg/dL — AB (ref 65–99)
Potassium: 4.7 mmol/L (ref 3.5–5.1)
SODIUM: 138 mmol/L (ref 135–145)

## 2016-06-23 LAB — CBC WITH DIFFERENTIAL/PLATELET
BASOS ABS: 0.1 10*3/uL (ref 0.0–0.1)
BASOS PCT: 1 %
EOS ABS: 1.3 10*3/uL — AB (ref 0.0–0.7)
EOS PCT: 8 %
HCT: 35.2 % — ABNORMAL LOW (ref 39.0–52.0)
Hemoglobin: 11.1 g/dL — ABNORMAL LOW (ref 13.0–17.0)
LYMPHS PCT: 6 %
Lymphs Abs: 0.9 10*3/uL (ref 0.7–4.0)
MCH: 31.5 pg (ref 26.0–34.0)
MCHC: 31.5 g/dL (ref 30.0–36.0)
MCV: 100 fL (ref 78.0–100.0)
MONO ABS: 1.7 10*3/uL — AB (ref 0.1–1.0)
Monocytes Relative: 11 %
Neutro Abs: 12.1 10*3/uL — ABNORMAL HIGH (ref 1.7–7.7)
Neutrophils Relative %: 74 %
PLATELETS: 369 10*3/uL (ref 150–400)
RBC: 3.52 MIL/uL — AB (ref 4.22–5.81)
RDW: 15.1 % (ref 11.5–15.5)
WBC: 16.2 10*3/uL — AB (ref 4.0–10.5)

## 2016-06-23 LAB — URINE MICROSCOPIC-ADD ON: RBC / HPF: NONE SEEN RBC/hpf (ref 0–5)

## 2016-06-23 LAB — TROPONIN I: Troponin I: 0.13 ng/mL (ref <0.03)

## 2016-06-23 LAB — STREP PNEUMONIAE URINARY ANTIGEN: STREP PNEUMO URINARY ANTIGEN: NEGATIVE

## 2016-06-23 LAB — BRAIN NATRIURETIC PEPTIDE: B NATRIURETIC PEPTIDE 5: 1426.3 pg/mL — AB (ref 0.0–100.0)

## 2016-06-23 LAB — PROCALCITONIN: Procalcitonin: <0.1 ng/mL

## 2016-06-23 LAB — MAGNESIUM: MAGNESIUM: 1.8 mg/dL (ref 1.7–2.4)

## 2016-06-23 LAB — I-STAT CG4 LACTIC ACID, ED: Lactic Acid, Venous: 0.59 mmol/L (ref 0.5–1.9)

## 2016-06-23 MED ORDER — ADULT MULTIVITAMIN W/MINERALS CH
1.0000 | ORAL_TABLET | Freq: Every day | ORAL | Status: DC
  Administered 2016-06-23 – 2016-07-02 (×10): 1 via ORAL
  Filled 2016-06-23 (×10): qty 1

## 2016-06-23 MED ORDER — SODIUM CHLORIDE 0.9% FLUSH: 3.0000 mL | INTRAVENOUS | Status: DC | PRN

## 2016-06-23 MED ORDER — PIPERACILLIN-TAZOBACTAM 3.375 G IVPB 30 MIN
3.3750 g | Freq: Once | INTRAVENOUS | Status: AC
  Administered 2016-06-23: 3.375 g via INTRAVENOUS
  Filled 2016-06-23: qty 50

## 2016-06-23 MED ORDER — FUROSEMIDE 10 MG/ML IJ SOLN
20.0000 mg | Freq: Once | INTRAMUSCULAR | Status: AC
  Administered 2016-06-23: 20 mg via INTRAVENOUS
  Filled 2016-06-23: qty 2

## 2016-06-23 MED ORDER — AMLODIPINE BESYLATE 10 MG PO TABS
10.0000 mg | ORAL_TABLET | Freq: Every day | ORAL | Status: DC
  Administered 2016-06-23 – 2016-06-25 (×3): 10 mg via ORAL
  Filled 2016-06-23 (×3): qty 1

## 2016-06-23 MED ORDER — SODIUM CHLORIDE 0.9 % IV SOLN
250.0000 mL | INTRAVENOUS | Status: DC | PRN
  Administered 2016-06-28: 250 mL via INTRAVENOUS

## 2016-06-23 MED ORDER — ISOSORBIDE MONONITRATE ER 30 MG PO TB24
30.0000 mg | ORAL_TABLET | Freq: Every day | ORAL | Status: DC
  Administered 2016-06-23 – 2016-07-02 (×10): 30 mg via ORAL
  Filled 2016-06-23 (×10): qty 1

## 2016-06-23 MED ORDER — ONDANSETRON HCL 4 MG/2ML IJ SOLN: 4.0000 mg | Freq: Four times a day (QID) | INTRAMUSCULAR | Status: DC | PRN

## 2016-06-23 MED ORDER — PIPERACILLIN-TAZOBACTAM IN DEX 2-0.25 GM/50ML IV SOLN
2.2500 g | Freq: Four times a day (QID) | INTRAVENOUS | Status: DC
  Filled 2016-06-23 (×2): qty 50

## 2016-06-23 MED ORDER — FUROSEMIDE 10 MG/ML IJ SOLN
40.0000 mg | Freq: Once | INTRAMUSCULAR | Status: AC
  Administered 2016-06-23: 40 mg via INTRAVENOUS
  Filled 2016-06-23: qty 4

## 2016-06-23 MED ORDER — VANCOMYCIN HCL IN DEXTROSE 750-5 MG/150ML-% IV SOLN
750.0000 mg | INTRAVENOUS | Status: DC
  Administered 2016-06-24 – 2016-06-25 (×2): 750 mg via INTRAVENOUS
  Filled 2016-06-23 (×2): qty 150

## 2016-06-23 MED ORDER — PIPERACILLIN-TAZOBACTAM 3.375 G IVPB
3.3750 g | Freq: Three times a day (TID) | INTRAVENOUS | Status: DC
  Administered 2016-06-23 – 2016-06-25 (×6): 3.375 g via INTRAVENOUS
  Filled 2016-06-23 (×8): qty 50

## 2016-06-23 MED ORDER — ASPIRIN EC 81 MG PO TBEC
81.0000 mg | DELAYED_RELEASE_TABLET | Freq: Every day | ORAL | Status: DC
  Administered 2016-06-23 – 2016-07-02 (×10): 81 mg via ORAL
  Filled 2016-06-23 (×10): qty 1

## 2016-06-23 MED ORDER — METOPROLOL SUCCINATE ER 50 MG PO TB24
75.0000 mg | ORAL_TABLET | Freq: Every day | ORAL | Status: DC
  Administered 2016-06-23 – 2016-07-02 (×10): 75 mg via ORAL
  Filled 2016-06-23 (×10): qty 1

## 2016-06-23 MED ORDER — ENOXAPARIN SODIUM 30 MG/0.3ML ~~LOC~~ SOLN
30.0000 mg | SUBCUTANEOUS | Status: DC
  Administered 2016-06-23: 30 mg via SUBCUTANEOUS
  Filled 2016-06-23: qty 0.3

## 2016-06-23 MED ORDER — VANCOMYCIN HCL 10 G IV SOLR
1500.0000 mg | Freq: Once | INTRAVENOUS | Status: AC
  Administered 2016-06-23: 1500 mg via INTRAVENOUS
  Filled 2016-06-23: qty 1500

## 2016-06-23 MED ORDER — CLOPIDOGREL BISULFATE 75 MG PO TABS
75.0000 mg | ORAL_TABLET | Freq: Every day | ORAL | Status: DC
  Administered 2016-06-23 – 2016-07-02 (×10): 75 mg via ORAL
  Filled 2016-06-23 (×10): qty 1

## 2016-06-23 MED ORDER — SODIUM CHLORIDE 0.9% FLUSH
3.0000 mL | Freq: Two times a day (BID) | INTRAVENOUS | Status: DC
  Administered 2016-06-23 – 2016-07-02 (×16): 3 mL via INTRAVENOUS

## 2016-06-23 MED ORDER — PANTOPRAZOLE SODIUM 40 MG PO TBEC
40.0000 mg | DELAYED_RELEASE_TABLET | Freq: Two times a day (BID) | ORAL | Status: DC
  Administered 2016-06-23 – 2016-07-02 (×18): 40 mg via ORAL
  Filled 2016-06-23 (×18): qty 1

## 2016-06-23 MED ORDER — SIMVASTATIN 20 MG PO TABS
20.0000 mg | ORAL_TABLET | Freq: Every day | ORAL | Status: DC
  Administered 2016-06-23 – 2016-07-01 (×9): 20 mg via ORAL
  Filled 2016-06-23 (×9): qty 1

## 2016-06-23 MED ORDER — FUROSEMIDE 10 MG/ML IJ SOLN
60.0000 mg | Freq: Two times a day (BID) | INTRAMUSCULAR | Status: DC
  Administered 2016-06-23 – 2016-06-25 (×4): 60 mg via INTRAVENOUS
  Filled 2016-06-23 (×4): qty 6

## 2016-06-23 MED ORDER — ALPRAZOLAM 0.25 MG PO TABS: 0.2500 mg | ORAL_TABLET | Freq: Every evening | ORAL | Status: DC | PRN

## 2016-06-23 MED ORDER — FUROSEMIDE 10 MG/ML IJ SOLN: 60.0000 mg | Freq: Two times a day (BID) | INTRAMUSCULAR | Status: DC

## 2016-06-23 MED ORDER — PIPERACILLIN-TAZOBACTAM 3.375 G IVPB: 3.3750 g | Freq: Four times a day (QID) | INTRAVENOUS | Status: DC

## 2016-06-23 MED ORDER — ACETAMINOPHEN 325 MG PO TABS
650.0000 mg | ORAL_TABLET | ORAL | Status: DC | PRN
  Administered 2016-06-30 – 2016-07-02 (×6): 650 mg via ORAL
  Filled 2016-06-23 (×6): qty 2

## 2016-06-23 MED ORDER — ZOLPIDEM TARTRATE 5 MG PO TABS: 5.0000 mg | ORAL_TABLET | Freq: Every evening | ORAL | Status: DC | PRN

## 2016-06-23 NOTE — ED Triage Notes (Signed)
Pt comes from independent living at [facility] with sudden onset of SOB before bed and just worsened to the point he was unable to sleep On arrival to facility ems reports pr o2 was 89% on RA. Lung sounds clear per EMS. Bilateral LE edema- weeping.

## 2016-06-23 NOTE — Progress Notes (Addendum)
Pharmacy Antibiotic Note  [name] is a 82 y.o. male admitted on 06/23/2016 with pneumonia.  Pharmacy has been consulted for vancomycin and zosyn dosing. Afebrile, WBC 16.2, sCr 1.84, CrCl ~28 close to baseline  Plan: Vancomycin 750mg  IV every 24 hours.  Goal trough 15-20 mcg/mL.  Zosyn 3.375g IV every 6 hours (infused over 4 hours) Labs, Monitor c/s  Height: 5\' 8"  (172.7 cm) Weight: 176 lb 3.2 oz (79.9 kg) IBW/kg (Calculated) : 68.4  Temp (24hrs), Avg:97.6 F (36.4 C), Min:97.4 F (36.3 C), Max:97.7 F (36.5 C)   Recent Labs Lab 06/23/16 0705 06/23/16 0845  WBC 16.2*  --   CREATININE 1.84*  --   LATICACIDVEN  --  0.59    Estimated Creatinine Clearance: 28.4 mL/min (by C-G formula based on SCr of 1.84 mg/dL).    No Known Allergies  Antimicrobials this admission: Vancomycin 8/15 >>  Dose adjustments this admission: N/A  Microbiology results: 8/15 BCx: pending x2 8/15 UCx: pending  Thank you for allowing pharmacy to be a part of this patient's care.  Georga Bora, PharmD Clinical Pharmacist Pager: [phone] 06/23/2016 8:59 AM

## 2016-06-23 NOTE — ED Notes (Signed)
CRITICAL VALUE ALERT  Critical value received:  Troponin 0.13  Date of notification:  06/23/2016   Time of notification:  0819 Critical value read back:Yes.    Nurse who received alert:  Leodis Rains  MD notified (1st page):  Dr. Reather Converse  Time of first page:  0820  MD responded, no further orders at this time

## 2016-06-23 NOTE — ED Notes (Signed)
Pt returned from radiology and placed back on monitor

## 2016-06-23 NOTE — ED Provider Notes (Signed)
[place] DEPT Provider Note   CSN: [id number] Arrival date & time: 06/23/16  [id number]     History   Chief Complaint Chief Complaint  Patient presents with  . Shortness of Breath    HPI [name] is a 82 y.o. male.  82 year old male with history of alcohol cirrhosis, kidney disease, cardiomyopathy, mild aortic stenosis, recent hospitalization, venous stasis presents with worsening short of breath since last night. Nothing specifically worsens it. Leg swelling chronic per report. Patient has mild dementia. Family helping with details. Plavix is new medicine since last admission. No significant cough reported. No fevers at home. Patient in independent living.    Shortness of Breath  Associated symptoms include cough. Pertinent negatives include no fever, no sore throat, no ear pain, no chest pain, no vomiting, no abdominal pain and no rash.    Past Medical History:  Diagnosis Date  . Alcoholic cirrhosis ([place])   . 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 Alcoholism ([place])   . Ascites 11/2004   "early" SBP on paracentesis.   . Chronic gastritis 05/01/2016  . CKD (chronic kidney disease), stage III 10/11/2015  . Colon polyp perhaps in 1990s   pathology/type not known  . Diverticulosis of colon perhaps in 1990s  . Fracture of wrist 2014 spring vs early summer   refused orthopods suggestion to set the bone.   Marland Kitchen GERD (gastroesophageal reflux disease)   . HLD (hyperlipidemia)   . HTN (hypertension)   . Hypertrophic cardiomyopathy ([place])   . Portal hypertensive gastropathy 2006  . Renal insufficiency   . Rhabdomyolysis 01/2005    Patient Active Problem List   Diagnosis Date Noted  . HCAP (healthcare-associated pneumonia) 06/23/2016  . Chest pain 06/13/2016  . Acute on chronic diastolic (congestive) heart failure ([place])   . RBBB   . Abnormal lung sounds 05/28/2016  . Gastritis 05/02/2016  . Elevated troponin 05/02/2016  . Demand ischemia of myocardium ([place])  05/02/2016  . Acute blood loss anemia 05/02/2016  . Pain in the chest 05/02/2016  . Duodenal ulcer disease   . Melena   . Iron deficiency anemia due to chronic blood loss   . GI bleed 04/13/2016  . CKD (chronic kidney disease) stage 3, GFR 30-59 ml/min 10/11/2015  . Memory loss 10/04/2015  . DOE (dyspnea on exertion) 07/17/2015  . Hyperglycemia 02/23/2015  . Gross hematuria 08/23/2014  . Aortic stenosis, mild 09/13/2013  . Acute duodenal ulcer with hemorrhage, without mention of obstruction 08/04/2013  . Malignant melanoma of lower extremity or hip, left 03/17/2012  . Routine health maintenance 03/17/2012  . Alcoholic cirrhosis ([place]) 123456  . Portal hypertensive gastropathy 05/26/2011  . Edema 10/10/2010  . Alcohol abuse 09/05/2009  . Hyperlipidemia 06/23/2009  . HYPERTENSION, BENIGN 06/23/2009  . Hypertrophic obstructive cardiomyopathy ([place]) 06/23/2009    Past Surgical History:  Procedure Laterality Date  . BASAL CELL CARCINOMA EXCISION Left 11/2012   lower eyelid  . ESOPHAGOGASTRODUODENOSCOPY N/A 08/04/2013   Procedure: ESOPHAGOGASTRODUODENOSCOPY (EGD);  Surgeon: Ladene Artist, MD;  Location: [facility] ENDOSCOPY;  Service: Endoscopy;  Laterality: N/A;  . ESOPHAGOGASTRODUODENOSCOPY (EGD) WITH PROPOFOL N/A 05/01/2016   Procedure: ESOPHAGOGASTRODUODENOSCOPY (EGD) WITH PROPOFOL;  Surgeon: Jerene Bears, MD;  Location: [facility] ENDOSCOPY;  Service: Endoscopy;  Laterality: N/A;  . INGUINAL HERNIA REPAIR    . MELANOMA EXCISION Left 02/2011   malignant melanoma of thigh       Home Medications    Prior to Admission medications   Medication Sig Start Date End Date  Taking? Authorizing Provider  acetaminophen (TYLENOL) 325 MG tablet Take 650 mg by mouth every 6 (six) hours as needed for pain.   Yes Historical Provider, MD  ALPRAZolam (XANAX) 0.25 MG tablet Take 1 tablet (0.25 mg total) by mouth at bedtime as needed for anxiety. 05/19/16  Yes Biagio Borg, MD  amLODipine (NORVASC) 10 MG  tablet Take 1 tablet (10 mg total) by mouth daily. 01/27/16  Yes Burnell Blanks, MD  aspirin EC 81 MG EC tablet Take 1 tablet (81 mg total) by mouth daily. 06/15/16  Yes Robbie Lis, MD  clopidogrel (PLAVIX) 75 MG tablet Take 1 tablet (75 mg total) by mouth daily. 06/15/16  Yes Robbie Lis, MD  doxycycline (VIBRAMYCIN) 100 MG capsule Take 1 capsule (100 mg total) by mouth 2 (two) times daily. One po bid x 7 days 06/17/16  Yes April Palumbo, MD  furosemide (LASIX) 40 MG tablet Take 1 tablet (40 mg total) by mouth daily. 05/25/16  Yes Biagio Borg, MD  isosorbide mononitrate (IMDUR) 30 MG 24 hr tablet TAKE ONE TABLET EACH DAY Patient taking differently: Take 30 mg by mouth daily 06/02/16  Yes Biagio Borg, MD  metoprolol succinate (TOPROL-XL) 25 MG 24 hr tablet Take 3 tablets (75 mg total) by mouth daily. Take with or immediately following a meal. 06/15/16  Yes Robbie Lis, MD  Multiple Vitamin (MULTIVITAMIN WITH MINERALS) TABS Take 1 tablet by mouth daily.   Yes Historical Provider, MD  pantoprazole (PROTONIX) 40 MG tablet Take 1 tablet (40 mg total) by mouth 2 (two) times daily. 05/02/16  Yes Cl[place] Marisa Hua, MD  simvastatin (ZOCOR) 20 MG tablet TAKE ONE TABLET AT BEDTIME Patient taking differently: Take 20 mg by mouth at bedtime 06/17/16  Yes Biagio Borg, MD  zolpidem (AMBIEN) 5 MG tablet TAKE ONE TABLET AT BEDTIME AS NEEDED FORSLEEP Patient taking differently: Take 5 mg by mouth at bedtime as needed for sleep.  05/25/16  Yes Golden Circle, FNP    Family History Family History  Problem Relation Age of Onset  . Cancer Father 19    lung cancer  . Heart disease Mother   . Heart disease Sister     Social History Social History  Substance Use Topics  . Smoking status: Never Smoker  . Smokeless tobacco: Never Used  . Alcohol use No     Comment: member of AA     Allergies   Review of patient's allergies indicates no known allergies.   Review of Systems Review of Systems    Constitutional: Positive for fatigue. Negative for chills and fever.  HENT: Negative for ear pain and sore throat.   Eyes: Negative for pain and visual disturbance.  Respiratory: Positive for cough and shortness of breath.   Cardiovascular: Negative for chest pain and palpitations.  Gastrointestinal: Negative for abdominal pain and vomiting.  Genitourinary: Negative for dysuria and hematuria.  Musculoskeletal: Negative for arthralgias and back pain.  Skin: Negative for color change and rash.  Neurological: Negative for seizures and syncope.  All other systems reviewed and are negative.    Physical Exam Updated Vital Signs BP 142/69   Pulse 87   Temp 97.4 F (36.3 C) (Rectal)   Resp 19   Ht 5\' 8"  (1.727 m)   Wt 176 lb 3.2 oz (79.9 kg)   SpO2 98%   BMI 26.79 kg/m   Physical Exam  Constitutional: He appears well-developed and well-nourished.  HENT:  Head: Normocephalic  and atraumatic.  Dry mucous membranes  Eyes: Conjunctivae are normal.  Neck: Neck supple.  Cardiovascular: Normal rate and regular rhythm.   No murmur heard. Pulmonary/Chest: Effort normal. No respiratory distress. He has rales (mild bilateralbasis).  Abdominal: Soft. There is no tenderness.  Musculoskeletal: He exhibits edema.  Neurological: He is alert.  Skin: Skin is warm and dry. Rash noted. There is erythema.  Patient has 2+ edema extending from scrotum bilateral legs with mild erythema worsen the left, chronic venous stasis changes mild warmth to palpation worse than the left, bandages bilateral lower extremities  Psychiatric:  Alert and oriented, answers most questions appropriately however mild memory difficulty.  Nursing note and vitals reviewed.    ED Treatments / Results  Labs (all labs ordered are listed, but only abnormal results are displayed) Labs Reviewed  CBC WITH DIFFERENTIAL/PLATELET - Abnormal; Notable for the following:       Result Value   WBC 16.2 (*)    RBC 3.52 (*)     Hemoglobin 11.1 (*)    HCT 35.2 (*)    Neutro Abs 12.1 (*)    Monocytes Absolute 1.7 (*)    Eosinophils Absolute 1.3 (*)    All other components within normal limits  BASIC METABOLIC PANEL - Abnormal; Notable for the following:    CO2 18 (*)    Glucose, Bld 135 (*)    BUN 40 (*)    Creatinine, Ser 1.84 (*)    GFR calc non Af Amer 32 (*)    GFR calc Af Amer 37 (*)    All other components within normal limits  BRAIN NATRIURETIC PEPTIDE - Abnormal; Notable for the following:    B Natriuretic Peptide 1,426.3 (*)    All other components within normal limits  TROPONIN I - Abnormal; Notable for the following:    Troponin I 0.13 (*)    All other components within normal limits  URINALYSIS, ROUTINE W REFLEX MICROSCOPIC (NOT AT [facility]) - Abnormal; Notable for the following:    Protein, ur 100 (*)    All other components within normal limits  URINE MICROSCOPIC-ADD ON - Abnormal; Notable for the following:    Squamous Epithelial / LPF 0-5 (*)    Bacteria, UA RARE (*)    All other components within normal limits  CULTURE, BLOOD (ROUTINE X 2)  CULTURE, BLOOD (ROUTINE X 2)  URINE CULTURE  PROCALCITONIN  I-STAT CG4 LACTIC ACID, ED    EKG  EKG Interpretation  Date/Time:  Tuesday June 23 2016 06:54:00 EDT Ventricular Rate:  107 PR Interval:    QRS Duration: 148 QT Interval:  343 QTC Calculation: 458 R Axis:   0 Text Interpretation:  Sinus tachycardia Atrial premature complex Right bundle branch block Anteroseptal infarct, age indeterminate Confirmed by DELO  MD, DOUGLAS ([id number]) on 06/23/2016 6:58:09 AM Also confirmed by Stark Jock  MD, DOUGLAS ([id number]), editor Lorenda Cahill CT, [place] [phone])  on 06/23/2016 7:45:25 AM       Radiology Dg Chest 2 View  Result Date: 06/23/2016 CLINICAL DATA:  Shortness of Breath EXAM: CHEST  2 VIEW COMPARISON:  June 12, 2016 FINDINGS: There remains airspace consolidation throughout much of the left lower lobe with small left effusion. There is stable elevation of the  right hemidiaphragm. There is underlying mild chronic interstitial prominence without frank edema. Heart size is within normal limits. The pulmonary vascularity is normal. No adenopathy. There is degenerative change in the thoracic spine. IMPRESSION: Persistent left lower lobe airspace consolidation small left effusion.  Mild underlying interstitial prominence, probably representing chronic inflammatory type change. No new opacity. Stable cardiac silhouette. Stable elevation of the right hemidiaphragm. Electronically Signed   By: Lowella Grip III M.D.   On: 06/23/2016 07:43    Procedures Procedures (including critical care time)   EMERGENCY DEPARTMENT [place] CARDIAC EXAM "Study: Limited Ultrasound of the heart and pericardium"  INDICATIONS:Dyspnea Multiple views of the heart and pericardium were obtained in real-time with a multi-frequency probe.  PERFORMED [id number]  IMAGES ARCHIVED?: Yes  FINDINGS: No pericardial effusion and Decreased contractility  LIMITATIONS:  Body habitus  VIEWS USED: Subcostal 4 chamber, Parasternal long axis, Parasternal short axis and Apical 4 chamber   INTERPRETATION: Cardiac activity present, Pericardial effusioin absent, Cardiac tamponade absent and Decreased contractility  CPT Code: [phone] (limited transthoracic cardiac)  Medications Ordered in ED Medications  piperacillin-tazobactam (ZOSYN) IVPB 3.375 g (3.375 g Intravenous New Bag/Given 06/23/16 0850)  vancomycin (VANCOCIN) 1,500 mg in sodium chloride 0.9 % 500 mL IVPB (not administered)  furosemide (LASIX) injection 60 mg (not administered)  vancomycin (VANCOCIN) IVPB 750 mg/150 ml premix (not administered)  furosemide (LASIX) injection 20 mg (20 mg Intravenous Given 06/23/16 0851)     Initial Impression / Assessment and Plan / ED Course  I have reviewed the triage vital signs and the nursing notes.  Pertinent labs & imaging results that were available during my care of the patient were reviewed  by me and considered in my medical decision making (see chart for details).  Clinical Course   Patient with multiple medical problems and recent auscultation presents with worsening shortness of breath. Clinical concern for heart failure and pneumonia with physical exam findings, blood work and x-ray reviewed. Bedside ultrasound confirmed the lines supporting interstitial edema. Patient does have concern for cellulitis is 1 the left leg. Plan to cover broad antibiotics for hospital acquired pneumonia/cellulitis and Lasix for heart failure exacerbation. Patient requiring 3 L nasal cane though in the ER. Discussed with triad hospitalist for telemetry admission  The patients results and plan were reviewed and discussed.   Any x-rays performed were independently reviewed by myself.   Differential diagnosis were considered with the presenting HPI.  Medications  piperacillin-tazobactam (ZOSYN) IVPB 3.375 g (3.375 g Intravenous New Bag/Given 06/23/16 0850)  vancomycin (VANCOCIN) 1,500 mg in sodium chloride 0.9 % 500 mL IVPB (not administered)  furosemide (LASIX) injection 60 mg (not administered)  vancomycin (VANCOCIN) IVPB 750 mg/150 ml premix (not administered)  furosemide (LASIX) injection 20 mg (20 mg Intravenous Given 06/23/16 0851)    Vitals:   06/23/16 0652 06/23/16 0705 06/23/16 0800 06/23/16 0846  BP:  143/68 142/69   Pulse:   87   Resp:   19   Temp: 97.7 F (36.5 C)   97.4 F (36.3 C)  TempSrc: Oral   Rectal  SpO2:  100% 98%   Weight: 162 lb (73.5 kg)   176 lb 3.2 oz (79.9 kg)  Height: 5\' 8"  (1.727 m)       Final diagnoses:  Acute congestive heart failure, unspecified congestive heart failure type (HCC)  HCAP (healthcare-associated pneumonia)  Venous stasis dermatitis of both lower extremities    Admission/ observation were discussed with the admitting physician, patient and/or family and they are comfortable with the plan.   Final Clinical Impressions(s) / ED Diagnoses    Final diagnoses:  Acute congestive heart failure, unspecified congestive heart failure type ([place])  HCAP (healthcare-associated pneumonia)  Venous stasis dermatitis of both lower extremities    New  Prescriptions New Prescriptions   No medications on file     Elnora Morrison, MD 06/23/16 [phone]

## 2016-06-23 NOTE — H&P (Signed)
History and Physical    [name] [id number] DOB: 11/16/1928 DOA: 06/23/2016   PCP: [name], MD   Patient coming from/Resides with: Independent living at [place] Complaint: Shortness of breath  HPI: [name] is a 82 y.o. male with medical history significant for HOCM as well as chronic combined systolic and diastolic heart failure, mild aortic stenosis, hypertension, chronic kidney disease stage III, hypertension, alcoholic cirrhosis with associated chronic bilateral lower extremity edema and chronic stasis dermatitis, memory loss, dyslipidemia, chronic iron deficiency anemia. He was recently hospitalized from 8/4 to 8/7 after presenting with chest pain and shortness of breath. Evaluation revealed findings of a non-STEMI secondary to demand ischemia in setting of chronic kidney disease. Myoview study showed evidence of apparent prior infarct without reversible ischemia. He was evaluating a cardiology during that admission. Since that time his return to the ER on 8/9 with stasis dermatitis that was concerning for evolving cellulitis so he was started on doxycycline. Unfortunately according to the son, patient has had increased lower extremity edema and apparently awakened during the night with shortness of breath. Room air saturation confirmed hypoxemia with O2 saturation 89%. He was transported to this facility via EMS.  ED Course:  Vital signs: 97.7-142/69-116-35-room air 89% and increased to 98% on 4 L oxygen Two-view chest x-ray: Persistent left lower lobe airspace consolidation with small left effusion and mild underlying interstitial prominence but no new opacities. Lab data: Sodium 138, potassium 4.7, CO2 18, BUN 40, creatinine 1.84, glucose 135, anion gap 11, BNP 1426, troponin 0.13, lactic acid 0.59, WBC 16,200 with elevated neutrophils 74% and absolute neutrophils 12.1%, hemoglobin 11.1, platelets 369,000, urinalysis unremarkable except for mild proteinuria  100 and no evidence of infectious process, blood cultures and urine culture obtained in ER Medications and treatments: Zosyn 3.375 g IV 1, vancomycin 1500 mg IV 1, Lasix 20 mg IV 1  Review of Systems:  In addition to the HPI above,  No Fever-chills, myalgias or other constitutional symptoms No Headache, changes with Vision or hearing, new weakness, tingling, numbness in any extremity, No problems swallowing food or Liquids, indigestion/reflux No Chest pain, Cough, palpitations No Abdominal pain, N/V; no melena or hematochezia, no dark tarry stools, Bowel movements are regular, No dysuria, hematuria or flank pain No new skin rashes, lesions, masses or bruises, No new joints pains-aches No recent weight gain or loss No polyuria, polydypsia or polyphagia,   Past Medical History:  Diagnosis Date  . Alcoholic cirrhosis ([place])   . 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 Alcoholism ([place])   . Ascites 11/2004   "early" SBP on paracentesis.   . Chronic gastritis 05/01/2016  . CKD (chronic kidney disease), stage III 10/11/2015  . Colon polyp perhaps in 1990s   pathology/type not known  . Diverticulosis of colon perhaps in 1990s  . Fracture of wrist 2014 spring vs early summer   refused orthopods suggestion to set the bone.   Marland Kitchen GERD (gastroesophageal reflux disease)   . HLD (hyperlipidemia)   . HTN (hypertension)   . Hypertrophic cardiomyopathy ([place])   . Portal hypertensive gastropathy 2006  . Renal insufficiency   . Rhabdomyolysis 01/2005    Past Surgical History:  Procedure Laterality Date  . BASAL CELL CARCINOMA EXCISION Left 11/2012   lower eyelid  . ESOPHAGOGASTRODUODENOSCOPY N/A 08/04/2013   Procedure: ESOPHAGOGASTRODUODENOSCOPY (EGD);  Surgeon: Ladene Artist, MD;  Location: [facility] ENDOSCOPY;  Service: Endoscopy;  Laterality: N/A;  . ESOPHAGOGASTRODUODENOSCOPY (EGD) WITH PROPOFOL N/A 05/01/2016  Procedure: ESOPHAGOGASTRODUODENOSCOPY (EGD) WITH PROPOFOL;  Surgeon: Jerene Bears,  MD;  Location: [facility] ENDOSCOPY;  Service: Endoscopy;  Laterality: N/A;  . INGUINAL HERNIA REPAIR    . MELANOMA EXCISION Left 02/2011   malignant melanoma of thigh    Social History   Social History  . Marital status: Married    Spouse name: N/A  . Number of children: 4  . Years of education: 24   Occupational History  . pathologist Retired    retired   Social History Main Topics  . Smoking status: Never Smoker  . Smokeless tobacco: Never Used  . Alcohol use No     Comment: member of AA  . Drug use: No  . Sexual activity: Not on file   Other Topics Concern  . 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  Remains independent. ACP- not discussed, will approach at next OV    Mobility: Utilizes a cane Work history: Retired Stage manager   No Known Allergies  Family History  Problem Relation Age of Onset  . Cancer Father 34    lung cancer  . Heart disease Mother   . Heart disease Sister      Prior to Admission medications   Medication Sig Start Date End Date Taking? Authorizing Provider  acetaminophen (TYLENOL) 325 MG tablet Take 650 mg by mouth every 6 (six) hours as needed for pain.   Yes Historical Provider, MD  ALPRAZolam (XANAX) 0.25 MG tablet Take 1 tablet (0.25 mg total) by mouth at bedtime as needed for anxiety. 05/19/16  Yes Biagio Borg, MD  amLODipine (NORVASC) 10 MG tablet Take 1 tablet (10 mg total) by mouth daily. 01/27/16  Yes Burnell Blanks, MD  aspirin EC 81 MG EC tablet Take 1 tablet (81 mg total) by mouth daily. 06/15/16  Yes Robbie Lis, MD  clopidogrel (PLAVIX) 75 MG tablet Take 1 tablet (75 mg total) by mouth daily. 06/15/16  Yes Robbie Lis, MD  doxycycline (VIBRAMYCIN) 100 MG capsule Take 1 capsule (100 mg total) by mouth 2 (two) times daily. One po bid x 7 days 06/17/16  Yes April Palumbo, MD  furosemide (LASIX) 40 MG tablet Take 1 tablet (40 mg total) by mouth daily. 05/25/16  Yes Biagio Borg,  MD  isosorbide mononitrate (IMDUR) 30 MG 24 hr tablet TAKE ONE TABLET EACH DAY Patient taking differently: Take 30 mg by mouth daily 06/02/16  Yes Biagio Borg, MD  metoprolol succinate (TOPROL-XL) 25 MG 24 hr tablet Take 3 tablets (75 mg total) by mouth daily. Take with or immediately following a meal. 06/15/16  Yes Robbie Lis, MD  Multiple Vitamin (MULTIVITAMIN WITH MINERALS) TABS Take 1 tablet by mouth daily.   Yes Historical Provider, MD  pantoprazole (PROTONIX) 40 MG tablet Take 1 tablet (40 mg total) by mouth 2 (two) times daily. 05/02/16  Yes Clanford Marisa Hua, MD  simvastatin (ZOCOR) 20 MG tablet TAKE ONE TABLET AT BEDTIME Patient taking differently: Take 20 mg by mouth at bedtime 06/17/16  Yes Biagio Borg, MD  zolpidem (AMBIEN) 5 MG tablet TAKE ONE TABLET AT BEDTIME AS NEEDED FORSLEEP Patient taking differently: Take 5 mg by mouth at bedtime as needed for sleep.  05/25/16  Yes Golden Circle, FNP    Physical Exam: Vitals:   06/23/16 0705 06/23/16 0800 06/23/16 0846 06/23/16 0900  BP: 143/68 142/69  125/64  Pulse:  87  92  Resp:  19  23  Temp:   97.4 F (36.3 C)   TempSrc:   Rectal   SpO2: 100% 98%  99%  Weight:   79.9 kg (176 lb 3.2 oz)   Height:          Constitutional: NAD, calm, comfortable But became visibly short of breath with repositioning self to sit forward in bed for pulmonary exam Eyes: PERRL, lids and conjunctivae normal ENMT: Mucous membranes are moist. Posterior pharynx clear of any exudate or lesions.Normal dentition.  Neck: normal, supple, no masses, no thyromegaly Respiratory: Coarse bilateral crackles posteriorly throughout all lung fields, no wheezing, Normal respiratory effort at rest but became tachypneic with minimal activity. No accessory muscle use. 4 L oxygen Cardiovascular: Regular rate and rhythm, grade 3/6 systolic murmur- no rubs / gallops. Marked bilateral 3+ lower extremity edema. 2+ pedal pulses. No carotid bruits.  Abdomen: no tenderness, no  masses palpated. No hepatosplenomegaly. Bowel sounds positive.  Musculoskeletal: no clubbing / cyanosis. No joint deformity upper and lower extremities. Good ROM, no contractures. Normal muscle tone.  Skin: no rashes, lesions, ulcers except for bilateral lower extremities with skin changes consistent with chronic stasis dermatitis and redness concerning for cellulitic skin change Neurologic: CN 2-12 grossly intact. Sensation intact, DTR normal. Strength 5/5 x all 4 extremities.  Psychiatric:  Alert and oriented x name with evidence of short-term memory deficits. Normal mood.    Labs on Admission: I have personally reviewed following labs and imaging studies  CBC:  Recent Labs Lab 06/23/16 0705  WBC 16.2*  NEUTROABS 12.1*  HGB 11.1*  HCT 35.2*  MCV 100.0  PLT 0000000   Basic Metabolic Panel:  Recent Labs Lab 06/23/16 0705  NA 138  K 4.7  CL 109  CO2 18*  GLUCOSE 135*  BUN 40*  CREATININE 1.84*  CALCIUM 9.0   GFR: Estimated Creatinine Clearance: 28.4 mL/min (by C-G formula based on SCr of 1.84 mg/dL). Liver Function Tests: No results for input(s): AST, ALT, ALKPHOS, BILITOT, PROT, ALBUMIN in the last 168 hours. No results for input(s): LIPASE, AMYLASE in the last 168 hours. No results for input(s): AMMONIA in the last 168 hours. Coagulation Profile: No results for input(s): INR, PROTIME in the last 168 hours. Cardiac Enzymes:  Recent Labs Lab 06/23/16 0705  TROPONINI 0.13*   BNP (last 3 results)  Recent Labs  07/22/15 1536  PROBNP 61.0   HbA1C: No results for input(s): HGBA1C in the last 72 hours. CBG: No results for input(s): GLUCAP in the last 168 hours. Lipid Profile: No results for input(s): CHOL, HDL, LDLCALC, TRIG, CHOLHDL, LDLDIRECT in the last 72 hours. Thyroid Function Tests: No results for input(s): TSH, T4TOTAL, FREET4, T3FREE, THYROIDAB in the last 72 hours. Anemia Panel: No results for input(s): VITAMINB12, FOLATE, FERRITIN, TIBC, IRON,  RETICCTPCT in the last 72 hours. Urine analysis:    Component Value Date/Time   COLORURINE YELLOW 06/23/2016 0831   APPEARANCEUR CLEAR 06/23/2016 0831   LABSPEC 1.023 06/23/2016 0831   PHURINE 5.0 06/23/2016 0831   GLUCOSEU NEGATIVE 06/23/2016 0831   GLUCOSEU NEGATIVE 07/17/2015 1148   HGBUR NEGATIVE 06/23/2016 0831   BILIRUBINUR NEGATIVE 06/23/2016 0831   KETONESUR NEGATIVE 06/23/2016 0831   PROTEINUR 100 (A) 06/23/2016 0831   UROBILINOGEN 0.2 07/17/2015 1148   NITRITE NEGATIVE 06/23/2016 0831   LEUKOCYTESUR NEGATIVE 06/23/2016 0831   Sepsis Labs: @LABRCNTIP (procalcitonin:4,lacticidven:4) ) Recent Results (from the past 240 hour(s))  Blood Culture (routine x 2)     Status: None (  Preliminary result)   Collection Time: 06/23/16  8:35 AM  Result Value Ref Range Status   Specimen Description BLOOD RIGHT ANTECUBITAL  Final   Special Requests BOTTLES DRAWN AEROBIC ONLY  5CC  Final   Culture PENDING  Incomplete   Report Status PENDING  Incomplete     Radiological Exams on Admission: Dg Chest 2 View  Result Date: 06/23/2016 CLINICAL DATA:  Shortness of Breath EXAM: CHEST  2 VIEW COMPARISON:  June 12, 2016 FINDINGS: There remains airspace consolidation throughout much of the left lower lobe with small left effusion. There is stable elevation of the right hemidiaphragm. There is underlying mild chronic interstitial prominence without frank edema. Heart size is within normal limits. The pulmonary vascularity is normal. No adenopathy. There is degenerative change in the thoracic spine. IMPRESSION: Persistent left lower lobe airspace consolidation small left effusion. Mild underlying interstitial prominence, probably representing chronic inflammatory type change. No new opacity. Stable cardiac silhouette. Stable elevation of the right hemidiaphragm. Electronically Signed   By: Lowella Grip III M.D.   On: 06/23/2016 07:43    EKG: (Independently reviewed) Sinus tachycardia with  underlying right bundle branch block, ventricular rate 107 bpm, QTC 458 ms, nonspecific ST-T wave changes unchanged from previous EKG  Assessment/Plan Principal Problem:   Acute respiratory failure with hypoxia:  A) Hypertrophic obstructive cardiomyopathy /  Acute on chronic combined systolic and diastolic CHF, NYHA class 1  B) HCAP (healthcare-associated pneumonia) -Patient presents with acute onset of shortness of breath and associated elevated BNP and worsening lower extremity edema concerning for heart failure exacerbation; patient poor historian given dementia but son relates that last week staff noticed patient with dyspnea as well -He also has x-ray findings that could be consistent with a pneumonia process and this is associated with leukocytosis so until proven otherwise need to treat as HCAP -Given patient's presenting with 2 possible sources for respiratory failure with associated hypoxemia as well as underlying chronic kidney disease and concerns over underlying ischemic heart disease currently on medical therapy it is anticipated patient will need at least 2 midnights for hospitalization -Procalcitonin to help clarify if any infectious etiology -Repeat chest x-ray in a.m.-rapid clearing would be more suggestive of heart failure etiology -Acute heart failure admission order set initiated -Pneumonia focused order set also initiated -Lasix 60 mg IV every 12 hours in setting of chronic kidney disease to promote appropriate diuresis -Weight has increased from 145 pounds in June to 165 pounds in July and today's weight is up to 177 pounds -Continue preadmission Imdur, Toprol -No ACE I/ARB secondary to chronic kidney disease -Echocardiogram completed last admission: Echocardiogram completed last admission: EF 45-50% with hypokinesis of the anteroseptal and apical myocardium, grade 1 diastolic dysfunction, very mild aortic stenosis  -Daily weights/intake and output -Broad-spectrum empiric  antibiotics with vancomycin and Zosyn with pharmacy dosing -Follow up on blood cultures and sputum culture -Continue supportive care with oxygen -Early mobilization  Active Problems:   HYPERTENSION, BENIGN -Imdur and Toprol as above -Continue preadmission Norvasc    Bilateral lower extremity edema -Has skin changes concerning for possible cellulitis and recently has been on doxycycline -Antibiotics as above -Possible component of heart failure contributing -UNNA boot in place prior to admission-consult WOC RN    CKD (chronic kidney disease) stage 3, GFR 30-59 ml/min -Based on renal function: 39/1.72 -Current renal function: 40/1.84 -Follow electrolytes closely with diuresis -Patient has a degree of chronic metabolic acidemia and uncertain if he would benefit from oral bicarbonate; unfortunately this  would also add to patient's sodium intake and could potentially worsen heart failure symptoms    Elevated troponin -Mild elevation in troponin at 0.13 which actually demonstrates a downward trend from previous admission peak of 0.81 -Patient currently denying chest pain -Evaluated by cardiology last admission and underwent nuclear medicine Myoview scan. Cardiology documented that the patient was not known to have CAD but has not had ischemic evaluation prior to last admission. The nuclear stress test was consistent with possible old infarction but no acute ischemia. EF on stress test was 38% and EF on echo was 45-50%. Patient refused cardiac catheterization during previous admission. Cardiology opted to manage his non-STEMI with aspirin, beta blocker and Plavix and we have continued at time of admission today    Hyperlipidemia -Continue statin    Alcoholic cirrhosis  -Apparently no longer consuming alcoholic beverages and during previous admission LFTs as well as total bilirubin were normal    Aortic stenosis, mild -Asymptomatic    Memory loss -Does not carry a formal diagnosis of  dementia and uncertain if memory loss more related to prior alcoholism -Also was not on disease modifying medications -Patient's wife died several weeks ago and son who is a long year has assumed the POA roll since patient's wife was POA    Iron deficiency anemia due to chronic blood loss -Hemoglobin stable and around baseline between 10.3 and 11.1     DVT prophylaxis: Lovenox  Code Status: DO NOT RESUSCITATE  Family Communication: Son, Montarius Gribbins at bedside Disposition Plan: Anticipate discharge back to independent living facility once medically stable-patient may need PT/OT evaluation prior to establishing discharge disposition Consults called: None Admission status: Inpatient/telemetry    Zulay Corrie L. ANP-BC [facility] Pager [phone]   If 7PM-7AM, please contact night-coverage [email or web address] Password [facility]  06/23/2016, 9:26 AM

## 2016-06-23 NOTE — Consult Note (Addendum)
[facility] Nurse wound consult note Reason for Consult requested for bilat legs, which had compression wraps applied prior to admission for fluid management. Wound type: Family member at bedside states pt had large amt drainage and maceration last week and has been on antibiotics.  Removed compression wraps and there are no open wounds or further drainage at this time. They state the appearance of his skin to the legs has greatly improved. Wound bed: Dry crusted skin; appearance consistent with venous stasis changes.   Dressing procedure/placement/frequency: Family member at the bedside to assess legs when dressings were removed.  Ortho tech paged to apply [employer] bilat and change twice a week to control edema. The compression wraps can be continued at the patient's facility after discharge. Discussed plan of care with family members and they verbalize understanding. Please re-consult if further assistance is needed.  Thank-you,  Julien Girt MSN, [place], [place], [place], [place]

## 2016-06-24 ENCOUNTER — Inpatient Hospital Stay (HOSPITAL_COMMUNITY): Payer: Medicare Other

## 2016-06-24 DIAGNOSIS — I10 Essential (primary) hypertension: Secondary | ICD-10-CM

## 2016-06-24 DIAGNOSIS — D500 Iron deficiency anemia secondary to blood loss (chronic): Secondary | ICD-10-CM

## 2016-06-24 DIAGNOSIS — E785 Hyperlipidemia, unspecified: Secondary | ICD-10-CM

## 2016-06-24 DIAGNOSIS — I421 Obstructive hypertrophic cardiomyopathy: Secondary | ICD-10-CM

## 2016-06-24 LAB — CBC
HCT: 32.1 % — ABNORMAL LOW (ref 39.0–52.0)
Hemoglobin: 9.9 g/dL — ABNORMAL LOW (ref 13.0–17.0)
MCH: 31.2 pg (ref 26.0–34.0)
MCHC: 30.8 g/dL (ref 30.0–36.0)
MCV: 101.3 fL — ABNORMAL HIGH (ref 78.0–100.0)
PLATELETS: 291 10*3/uL (ref 150–400)
RBC: 3.17 MIL/uL — ABNORMAL LOW (ref 4.22–5.81)
RDW: 15.2 % (ref 11.5–15.5)
WBC: 13.8 10*3/uL — AB (ref 4.0–10.5)

## 2016-06-24 LAB — BASIC METABOLIC PANEL
Anion gap: 9 (ref 5–15)
BUN: 41 mg/dL — AB (ref 6–20)
CALCIUM: 8.7 mg/dL — AB (ref 8.9–10.3)
CO2: 20 mmol/L — ABNORMAL LOW (ref 22–32)
CREATININE: 1.98 mg/dL — AB (ref 0.61–1.24)
Chloride: 111 mmol/L (ref 101–111)
GFR calc Af Amer: 34 mL/min — ABNORMAL LOW (ref >60)
GFR, EST NON AFRICAN AMERICAN: 29 mL/min — AB (ref >60)
Glucose, Bld: 145 mg/dL — ABNORMAL HIGH (ref 65–99)
Potassium: 4.3 mmol/L (ref 3.5–5.1)
SODIUM: 140 mmol/L (ref 135–145)

## 2016-06-24 LAB — URINE CULTURE: Culture: NO GROWTH

## 2016-06-24 LAB — HIV ANTIBODY (ROUTINE TESTING W REFLEX): HIV SCREEN 4TH GENERATION: NONREACTIVE

## 2016-06-24 MED ORDER — ENOXAPARIN SODIUM 30 MG/0.3ML ~~LOC~~ SOLN
30.0000 mg | SUBCUTANEOUS | Status: DC
  Administered 2016-06-24 – 2016-07-01 (×8): 30 mg via SUBCUTANEOUS
  Filled 2016-06-24 (×9): qty 0.3

## 2016-06-24 NOTE — Progress Notes (Signed)
PROGRESS NOTE    [name]  [id number]  DOB: January 14, 1929  DOA: 06/23/2016 PCP: [name], MD Outpatient Specialists:   Hospital course: [name] is a 82 y.o. male with a Past Medical History of alcoholic cirrhosis, CKD, diverticulosis, GERD, HLD, HTN, cardiomyopathy, renal insufficiency, portal HTN who presents with acute respiratory failure secondary to HCAP, and CHF decompensation. O2 sats 89% on RA and tachypnea.    Assessment & Plan:     Acute respiratory failure with hypoxia:  A) Hypertrophic obstructive cardiomyopathy /  Acute on chronic combined systolic and diastolic CHF, NYHA class 1  B) HCAP (healthcare-associated pneumonia) -Patient presents with acute onset of shortness of breath and associated elevated BNP and worsening lower extremity edema concerning for heart failure exacerbation; patient poor historian given dementia but son relates that last week staff noticed patient with dyspnea as well -He also has x-ray findings that could be consistent with a pneumonia process and this is associated with leukocytosis so until  -Lasix 60 mg IV every 12 hours in setting of chronic kidney disease to promote appropriate diuresis -Weight has increased from 145 pounds in June to 165 pounds in July and today's weight is up to 177 pounds -Continue preadmission Imdur, Toprol -No ACE I/ARB secondary to chronic kidney disease -Echocardiogram completed last admission: Echocardiogram completed last admission: EF 45-50% with hypokinesis of the anteroseptal and apical myocardium, grade 1 diastolic dysfunction, very mild aortic stenosis  -Daily weights/intake and output -Broad-spectrum empiric antibiotics with vancomycin and Zosyn with pharmacy dosing, plan to de-escalate 8/17.  -Follow up on blood cultures and sputum culture -Continue supportive care with oxygen -Early mobilization  Active Problems:   HYPERTENSION, BENIGN -Imdur and Toprol as above -Continue  preadmission Norvasc    Bilateral lower extremity edema -Has skin changes concerning for possible cellulitis and recently has been on doxycycline -Antibiotics as above -Possible component of heart failure contributing -UNNA boot in place prior to admission-consult WOC RN   CKD (chronic kidney disease) stage 3, GFR 30-59 ml/min -Based on renal function: 39/1.72 -Current renal function: 40/1.84 -Follow electrolytes closely with diuresis -Patient has a degree of chronic metabolic acidemia and uncertain if he would benefit from oral bicarbonate; unfortunately this would also add to patient's sodium intake and could potentially worsen heart failure symptoms    Elevated troponin -Mild elevation in troponin at 0.13 which actually demonstrates a downward trend from previous admission peak of 0.81 -Patient currently denying chest pain -Evaluated by cardiology last admission and underwent nuclear medicine Myoview scan. Cardiology documented that the patient was not known to have CAD but has not had ischemic evaluation prior to last admission. The nuclear stress test was consistent with possible old infarction but no acute ischemia. EF on stress test was 38% and EF on echo was 45-50%. Patient refused cardiac catheterization during previous admission. Cardiology opted to manage his non-STEMI with aspirin, beta blocker and Plavix and we have continued at time of admission today    Hyperlipidemia -Continue statin    Alcoholic cirrhosis  -Apparently no longer consuming alcoholic beverages and during previous admission LFTs as well as total bilirubin were normal    Aortic stenosis, mild -Asymptomatic   Dementia/Memory loss -Does not carry a formal diagnosis of dementia and uncertain if memory loss more related to prior alcoholism -Also was not on disease modifying medications -Patient's wife died several weeks ago and son who has assumed the POA roll since patient's wife was POA    Iron  deficiency  anemia due to chronic blood loss -Hemoglobin stable and around baseline between 10.3 and 11.1   DVT prophylaxis: Lovenox  Code Status: DO NOT RESUSCITATE  Family Communication: Son, Estanislado Score at bedside Disposition Plan: Anticipate discharge back to independent living facility  Consults called: None Admission status: Inpatient/telemetry    Antimicrobials: Anti-infectives    Start     Dose/Rate Route Frequency Ordered Stop   06/24/16 1000  vancomycin (VANCOCIN) IVPB 750 mg/150 ml premix     750 mg 150 mL/hr over 60 Minutes Intravenous Every 24 hours 06/23/16 0903     06/23/16 1500  piperacillin-tazobactam (ZOSYN) IVPB 2.25 g  Status:  Discontinued     2.25 g 100 mL/hr over 30 Minutes Intravenous Every 6 hours 06/23/16 0934 06/23/16 0959   06/23/16 1500  piperacillin-tazobactam (ZOSYN) IVPB 3.375 g  Status:  Discontinued     3.375 g 12.5 mL/hr over 240 Minutes Intravenous Every 6 hours 06/23/16 1000 06/23/16 1006   06/23/16 1500  piperacillin-tazobactam (ZOSYN) IVPB 3.375 g     3.375 g 12.5 mL/hr over 240 Minutes Intravenous Every 8 hours 06/23/16 1006     06/23/16 0845  piperacillin-tazobactam (ZOSYN) IVPB 3.375 g     3.375 g 100 mL/hr over 30 Minutes Intravenous  Once 06/23/16 0832 06/23/16 0930   06/23/16 0845  vancomycin (VANCOCIN) 1,500 mg in sodium chloride 0.9 % 500 mL IVPB     1,500 mg 250 mL/hr over 120 Minutes Intravenous  Once 06/23/16 0838 06/23/16 1130       Subjective: Pt says that he is feeling better, weight is down some, able to ambulate to bathroom with assistance.    Objective: Vitals:   06/23/16 2010 06/24/16 0111 06/24/16 0135 06/24/16 0423  BP: 140/67 137/65  (!) 144/69  Pulse: 89 93  87  Resp: 18 (!) 22  20  Temp: 98 F (36.7 C) 98 F (36.7 C)  98 F (36.7 C)  TempSrc: Oral Oral  Oral  SpO2: 94%  92% 95%  Weight:    79.3 kg (174 lb 14.4 oz)  Height:        Intake/Output Summary (Last 24 hours) at 06/24/16 1220 Last data  filed at 06/24/16 0933  Gross per 24 hour  Intake             1200 ml  Output              550 ml  Net              650 ml   Filed Weights   06/23/16 0846 06/23/16 1028 06/24/16 0423  Weight: 79.9 kg (176 lb 3.2 oz) 79.6 kg (175 lb 8 oz) 79.3 kg (174 lb 14.4 oz)   Exam:  General exam: awake, alert, sitting up in chair, no apparent distress.  Respiratory system: bibasilar crackles.  Cardiovascular system: S1 & S2 heard. No JVD, murmurs, gallops, clicks or pedal edema. Gastrointestinal system: Abdomen is nondistended, soft and nontender. Normal bowel sounds heard. Central nervous system: Alert and oriented. No focal neurological deficits. Extremities: 2+ edema bilateral LEs.  Data Reviewed: Basic Metabolic Panel:  Recent Labs Lab 06/23/16 0705 06/23/16 1242 06/24/16 0538  NA 138  --  140  K 4.7  --  4.3  CL 109  --  111  CO2 18*  --  20*  GLUCOSE 135*  --  145*  BUN 40*  --  41*  CREATININE 1.84*  --  1.98*  CALCIUM 9.0  --  8.7*  MG  --  1.8  --    Liver Function Tests: No results for input(s): AST, ALT, ALKPHOS, BILITOT, PROT, ALBUMIN in the last 168 hours. No results for input(s): LIPASE, AMYLASE in the last 168 hours. No results for input(s): AMMONIA in the last 168 hours. CBC:  Recent Labs Lab 06/23/16 0705  WBC 16.2*  NEUTROABS 12.1*  HGB 11.1*  HCT 35.2*  MCV 100.0  PLT 369   Cardiac Enzymes:  Recent Labs Lab 06/23/16 0705  TROPONINI 0.13*   CBG (last 3)  No results for input(s): GLUCAP in the last 72 hours. Recent Results (from the past 240 hour(s))  Urine culture     Status: None   Collection Time: 06/23/16  8:31 AM  Result Value Ref Range Status   Specimen Description URINE, RANDOM  Final   Special Requests NONE  Final   Culture NO GROWTH  Final   Report Status 06/24/2016 FINAL  Final  Blood Culture (routine x 2)     Status: None (Preliminary result)   Collection Time: 06/23/16  8:35 AM  Result Value Ref Range Status   Specimen  Description BLOOD RIGHT ANTECUBITAL  Final   Special Requests BOTTLES DRAWN AEROBIC ONLY  5CC  Final   Culture PENDING  Incomplete   Report Status PENDING  Incomplete     Studies: Dg Chest 2 View  Result Date: 06/24/2016 CLINICAL DATA:  Acute CHF, shortness of breath, cardiomyopathy, cirrhosis, chronic renal insufficiency EXAM: CHEST  2 VIEW COMPARISON:  PA and lateral chest x-ray of June 23, 2016 FINDINGS: There remains volume loss on the right with elevation of the hemidiaphragm. The left lung is adequately inflated. Coarse interstitial markings are present in the lower left lung. There is partial obscuration of the hemidiaphragm. The cardiac silhouette is enlarged. The pulmonary vascularity is mildly prominent centrally. IMPRESSION: Allowing for differences in positioning there has not been significant interval change since yesterday's study. Persistent left basilar atelectasis or pneumonia with trace left pleural effusion. Stable cardiomegaly. Electronically Signed   By: David  Martinique M.D.   On: 06/24/2016 07:52   Dg Chest 2 View  Result Date: 06/23/2016 CLINICAL DATA:  Shortness of Breath EXAM: CHEST  2 VIEW COMPARISON:  June 12, 2016 FINDINGS: There remains airspace consolidation throughout much of the left lower lobe with small left effusion. There is stable elevation of the right hemidiaphragm. There is underlying mild chronic interstitial prominence without frank edema. Heart size is within normal limits. The pulmonary vascularity is normal. No adenopathy. There is degenerative change in the thoracic spine. IMPRESSION: Persistent left lower lobe airspace consolidation small left effusion. Mild underlying interstitial prominence, probably representing chronic inflammatory type change. No new opacity. Stable cardiac silhouette. Stable elevation of the right hemidiaphragm. Electronically Signed   By: Lowella Grip III M.D.   On: 06/23/2016 07:43     Scheduled Meds: . amLODipine  10 mg  Oral Daily  . aspirin EC  81 mg Oral Daily  . clopidogrel  75 mg Oral Daily  . enoxaparin (LOVENOX) injection  30 mg Subcutaneous Q24H  . furosemide  60 mg Intravenous Q12H  . isosorbide mononitrate  30 mg Oral Daily  . metoprolol succinate  75 mg Oral Daily  . multivitamin with minerals  1 tablet Oral Daily  . pantoprazole  40 mg Oral BID  . piperacillin-tazobactam (ZOSYN)  IV  3.375 g Intravenous Q8H  . simvastatin  20 mg Oral QHS  . sodium chloride flush  3 mL Intravenous  Q12H  . vancomycin  750 mg Intravenous Q24H   Continuous Infusions:   Principal Problem:   Acute respiratory failure with hypoxia (HCC) Active Problems:   Elevated troponin   Hyperlipidemia   HYPERTENSION, BENIGN   Hypertrophic obstructive cardiomyopathy (HCC)   Bilateral lower extremity edema   Alcoholic cirrhosis (HCC)   Aortic stenosis, mild   Memory loss   CKD (chronic kidney disease) stage 3, GFR 30-59 ml/min   Iron deficiency anemia due to chronic blood loss   Acute combined systolic and diastolic CHF, NYHA class 1 ([place])   HCAP (healthcare-associated pneumonia)   Time spent:   Irwin Brakeman, MD, FAAFP [facility] Pager [phone]  If 7PM-7AM, please contact night-coverage [email or web address] Password [email or web address] 06/24/2016, 12:20 PM    LOS: 1 day

## 2016-06-24 NOTE — Care Management Note (Signed)
Case Management Note  Patient Details  Name: [name] MRN: [id number] Date of Birth: January 03, 1929  Subjective/Objective:     Admitted with Acute Resp Failure               Action/Plan: Patient lives at [facility]; PCP Dr [name]; private insurance with [facility] with prescription drug coverage; [facility] of choice [facility]; pt reports no problem getting his medication.He has a cane at home and is requesting a walker at discharge. Physical therapy to work with pt to access his functioning mobility; CM will continue to follow for DCP  Expected Discharge Date:    possibly 06/28/2016              Expected Discharge Plan:  [place]  Discharge planning Services  CM Consult  Status of Service:  In process, will continue to follow  Sherrilyn Rist [id number] 06/24/2016, 12:06 PM

## 2016-06-25 DIAGNOSIS — N5089 Other specified disorders of the male genital organs: Secondary | ICD-10-CM

## 2016-06-25 DIAGNOSIS — R600 Localized edema: Secondary | ICD-10-CM

## 2016-06-25 DIAGNOSIS — N183 Chronic kidney disease, stage 3 (moderate): Secondary | ICD-10-CM

## 2016-06-25 DIAGNOSIS — I5041 Acute combined systolic (congestive) and diastolic (congestive) heart failure: Secondary | ICD-10-CM

## 2016-06-25 LAB — BASIC METABOLIC PANEL
ANION GAP: 10 (ref 5–15)
BUN: 45 mg/dL — ABNORMAL HIGH (ref 6–20)
CALCIUM: 8.7 mg/dL — AB (ref 8.9–10.3)
CO2: 22 mmol/L (ref 22–32)
CREATININE: 2.35 mg/dL — AB (ref 0.61–1.24)
Chloride: 108 mmol/L (ref 101–111)
GFR calc Af Amer: 27 mL/min — ABNORMAL LOW (ref >60)
GFR, EST NON AFRICAN AMERICAN: 24 mL/min — AB (ref >60)
GLUCOSE: 136 mg/dL — AB (ref 65–99)
Potassium: 3.9 mmol/L (ref 3.5–5.1)
Sodium: 140 mmol/L (ref 135–145)

## 2016-06-25 LAB — CBC
HCT: 32.8 % — ABNORMAL LOW (ref 39.0–52.0)
HEMOGLOBIN: 10.1 g/dL — AB (ref 13.0–17.0)
MCH: 31.2 pg (ref 26.0–34.0)
MCHC: 30.8 g/dL (ref 30.0–36.0)
MCV: 101.2 fL — ABNORMAL HIGH (ref 78.0–100.0)
PLATELETS: 295 10*3/uL (ref 150–400)
RBC: 3.24 MIL/uL — ABNORMAL LOW (ref 4.22–5.81)
RDW: 15.3 % (ref 11.5–15.5)
WBC: 13.7 10*3/uL — ABNORMAL HIGH (ref 4.0–10.5)

## 2016-06-25 LAB — PROCALCITONIN: PROCALCITONIN: 0.14 ng/mL

## 2016-06-25 MED ORDER — FUROSEMIDE 10 MG/ML IJ SOLN: 80.0000 mg | Freq: Two times a day (BID) | INTRAMUSCULAR | Status: DC

## 2016-06-25 MED ORDER — METOLAZONE 2.5 MG PO TABS
2.5000 mg | ORAL_TABLET | Freq: Once | ORAL | Status: AC
  Administered 2016-06-25: 2.5 mg via ORAL
  Filled 2016-06-25: qty 1

## 2016-06-25 MED ORDER — POTASSIUM CHLORIDE CRYS ER 20 MEQ PO TBCR
40.0000 meq | EXTENDED_RELEASE_TABLET | Freq: Once | ORAL | Status: AC
  Administered 2016-06-25: 40 meq via ORAL
  Filled 2016-06-25: qty 2

## 2016-06-25 MED ORDER — DOXYCYCLINE HYCLATE 100 MG PO TABS
100.0000 mg | ORAL_TABLET | Freq: Two times a day (BID) | ORAL | Status: AC
  Administered 2016-06-25 – 2016-06-30 (×12): 100 mg via ORAL
  Filled 2016-06-25 (×13): qty 1

## 2016-06-25 MED ORDER — FUROSEMIDE 10 MG/ML IJ SOLN
80.0000 mg | Freq: Three times a day (TID) | INTRAMUSCULAR | Status: DC
  Administered 2016-06-25 – 2016-06-27 (×8): 80 mg via INTRAVENOUS
  Filled 2016-06-25 (×8): qty 8

## 2016-06-25 NOTE — Clinical Social Work Note (Signed)
Clinical Social Work Assessment  Patient Details  Name: [name] MRN: [id number] Date of Birth: 05-Mar-1929  Date of referral:  06/25/16               Reason for consult:  Discharge Planning                Permission sought to share information with:  Facility Art therapist granted to share information::  Yes, Verbal Permission Granted  Name::        Agency::  [facility]  Relationship::     Contact Information:     Housing/Transportation Living arrangements for the past 2 months:  [name] of Information:  Patient Patient Interpreter Needed:  None Criminal Activity/Legal Involvement Pertinent to Current Situation/Hospitalization:  No - Comment as needed Significant Relationships:  Adult Children Lives with:  Facility Resident Do you feel safe going back to the place where you live?  Yes Need for family participation in patient care:  No (Coment)  Care giving concerns:  Patient reported he is from [place] and would like to return. Patient is refusing SNF placement.   Social Worker assessment / plan: CSW met with patient at beside to complete assessment. Patient was resting comfortably in bed. CSW explained PT recommendation for SNF placement. CSW explained SNF search and placement process to the patient and answered his questions. Patient reported he would like to return to [place] with [facility].  Employment status:  Retired Nurse, adult PT Recommendations:  [place] / Referral to community resources:  [place]  Patient/Family's Response to care:  The patient appears happy with the care he is receiving in hospital and is appreciative of CSW assistance.  Patient/Family's Understanding of and Emotional Response to Diagnosis, Current Treatment, and Prognosis:  The patient has a good understanding of why he was admitted. He  understands the care plan and what he will need post discharge.  Emotional Assessment Appearance:  Appears stated age Attitude/Demeanor/Rapport:   (Patient was appropriate. ) Affect (typically observed):  Accepting, Appropriate, Calm Orientation:  Oriented to Place, Oriented to  Time, Oriented to Self, Oriented to Situation Alcohol / Substance use:  Not Applicable Psych involvement (Current and /or in the community):  No (Comment)  Discharge Needs  Concerns to be addressed:  Discharge Planning Concerns Readmission within the last 30 days:  No Current discharge risk:  None Barriers to Discharge:  No Barriers Identified   [name] Hofbauer A Braelin Brosch, LCSW 06/25/2016, 2:05 PM

## 2016-06-25 NOTE — Progress Notes (Signed)
PROGRESS NOTE    DEX GANSCHOW  [id number]  DOB: 11/21/1928  DOA: 06/23/2016 PCP: [name], MD Outpatient Specialists:   Hospital course: [name] is a 82 y.o. male with a Past Medical History of alcoholic cirrhosis, CKD, diverticulosis, GERD, HLD, HTN, cardiomyopathy, renal insufficiency, portal HTN who presents with acute respiratory failure secondary to HCAP, and CHF decompensation. O2 sats 89% on RA and tachypnea.    Assessment & Plan:     Acute respiratory failure with hypoxia:  A) Hypertrophic obstructive cardiomyopathy /  Acute on chronic combined systolic and diastolic CHF, NYHA class 1  B) HCAP (healthcare-associated pneumonia) -Patient presents with acute onset of shortness of breath and associated elevated BNP and worsening lower extremity edema concerning for heart failure exacerbation; patient poor historian given dementia but son relates that last week staff noticed patient with dyspnea as well -He also has x-ray findings that could be consistent with a pneumonia process and this is associated with leukocytosis  -Lasix 60 mg IV every 12 hours in setting of chronic kidney disease to promote diuresis -Weight has increased from 145 pounds in June to 165 pounds in July and today's weight is up to 177 pounds -Continue preadmission Imdur, Toprol -No ACE I/ARB secondary to chronic kidney disease -Echocardiogram completed last admission: Echocardiogram completed last admission: EF 45-50% with hypokinesis of the anteroseptal and apical myocardium, grade 1 diastolic dysfunction, very mild aortic stenosis  -Daily weights/intake and output -Broad-spectrum empiric antibiotics with vancomycin and Zosyn with pharmacy dosing, de-escalated 8/17.  -Follow up on blood cultures and sputum culture -Continue supportive care with oxygen -Early mobilization    HYPERTENSION, Essential -Imdur and Toprol as above -Continue preadmission Norvasc   Chronic Bilateral lower  extremity edema -Has skin changes concerning for possible cellulitis -Antibiotics as above -Possible component of heart failure contributing -UNNA boot in place prior to admission-consult WOC RN   CKD (chronic kidney disease) stage 3, GFR 30-59 ml/min -Follow electrolytes closely with diuresis -Bump in creatinine noted, may need to cut back on diuresis    Elevated troponin -Mild elevation in troponin at 0.13 which actually demonstrates a downward trend from previous admission peak of 0.81 -Patient currently denying chest pain -Evaluated by cardiology last admission and underwent nuclear medicine Myoview scan. Cardiology documented that the patient was not known to have CAD but has not had ischemic evaluation prior to last admission. The nuclear stress test was consistent with possible old infarction but no acute ischemia. EF on stress test was 38% and EF on echo was 45-50%. Patient refused cardiac catheterization during previous admission. Cardiology opted to manage his non-STEMI with aspirin, beta blocker and Plavix and we have continued at time of admission today    Hyperlipidemia -Continue statin    Alcoholic cirrhosis  -Apparently no longer consuming alcoholic beverages and during previous admission LFTs as well as total bilirubin were normal    Aortic stenosis, mild -Asymptomatic   Dementia -Does not carry a formal diagnosis of dementia and uncertain if memory loss more related to prior alcoholism -Also was not on disease modifying medications -Patient's wife died several weeks ago and son who has assumed the POA roll since patient's wife was POA    Iron deficiency anemia due to chronic blood loss -Hemoglobin stable and around baseline between 10.3 and 11.1   DVT prophylaxis: Lovenox  Code Status: DO NOT RESUSCITATE  Family Communication: Son, Herley Comte at bedside Disposition Plan: Anticipate discharge back to independent living facility  Consults called:  None Admission status: Inpatient/telemetry    Antimicrobials: Anti-infectives    Start     Dose/Rate Route Frequency Ordered Stop   06/24/16 1000  vancomycin (VANCOCIN) IVPB 750 mg/150 ml premix     750 mg 150 mL/hr over 60 Minutes Intravenous Every 24 hours 06/23/16 0903     06/23/16 1500  piperacillin-tazobactam (ZOSYN) IVPB 2.25 g  Status:  Discontinued     2.25 g 100 mL/hr over 30 Minutes Intravenous Every 6 hours 06/23/16 0934 06/23/16 0959   06/23/16 1500  piperacillin-tazobactam (ZOSYN) IVPB 3.375 g  Status:  Discontinued     3.375 g 12.5 mL/hr over 240 Minutes Intravenous Every 6 hours 06/23/16 1000 06/23/16 1006   06/23/16 1500  piperacillin-tazobactam (ZOSYN) IVPB 3.375 g     3.375 g 12.5 mL/hr over 240 Minutes Intravenous Every 8 hours 06/23/16 1006     06/23/16 0845  piperacillin-tazobactam (ZOSYN) IVPB 3.375 g     3.375 g 100 mL/hr over 30 Minutes Intravenous  Once 06/23/16 0832 06/23/16 0930   06/23/16 0845  vancomycin (VANCOCIN) 1,500 mg in sodium chloride 0.9 % 500 mL IVPB     1,500 mg 250 mL/hr over 120 Minutes Intravenous  Once 06/23/16 0838 06/23/16 1130     Subjective: Pt says that he is not feeling as well today    Objective: Vitals:   06/24/16 0423 06/24/16 1230 06/24/16 2111 06/25/16 0453  BP: (!) 144/69 (!) 127/56 131/65 (!) 136/57  Pulse: 87 76 81 85  Resp: 20 18 18    Temp: 98 F (36.7 C) 97.5 F (36.4 C) 97.4 F (36.3 C) 97.5 F (36.4 C)  TempSrc: Oral Oral Oral Oral  SpO2: 95% 94% 97% 97%  Weight: 79.3 kg (174 lb 14.4 oz)   78.2 kg (172 lb 8 oz)  Height:        Intake/Output Summary (Last 24 hours) at 06/25/16 1259 Last data filed at 06/25/16 1248  Gross per 24 hour  Intake             1070 ml  Output              227 ml  Net              843 ml   Filed Weights   06/23/16 1028 06/24/16 0423 06/25/16 0453  Weight: 79.6 kg (175 lb 8 oz) 79.3 kg (174 lb 14.4 oz) 78.2 kg (172 lb 8 oz)   Exam:  General exam: awake, alert, sitting up  in chair, no apparent distress.  Respiratory system: bibasilar crackles.  Cardiovascular system: S1 & S2 heard. No JVD, murmurs, gallops, clicks or pedal edema. Gastrointestinal system: Abdomen is nondistended, soft and nontender. Normal bowel sounds heard. Central nervous system: Alert and oriented. No focal neurological deficits. Extremities: 2+ edema bilateral LEs. Severe scrotal edema noted.   Data Reviewed: Basic Metabolic Panel:  Recent Labs Lab 06/23/16 0705 06/23/16 1242 06/24/16 0538 06/25/16 0418  NA 138  --  140 140  K 4.7  --  4.3 3.9  CL 109  --  111 108  CO2 18*  --  20* 22  GLUCOSE 135*  --  145* 136*  BUN 40*  --  41* 45*  CREATININE 1.84*  --  1.98* 2.35*  CALCIUM 9.0  --  8.7* 8.7*  MG  --  1.8  --   --    Liver Function Tests: No results for input(s): AST, ALT, ALKPHOS, BILITOT, PROT, ALBUMIN in the last 168 hours.  No results for input(s): LIPASE, AMYLASE in the last 168 hours. No results for input(s): AMMONIA in the last 168 hours. CBC:  Recent Labs Lab 06/23/16 0705 06/24/16 1406 06/25/16 0418  WBC 16.2* 13.8* 13.7*  NEUTROABS 12.1*  --   --   HGB 11.1* 9.9* 10.1*  HCT 35.2* 32.1* 32.8*  MCV 100.0 101.3* 101.2*  PLT 369 291 295   Cardiac Enzymes:  Recent Labs Lab 06/23/16 0705  TROPONINI 0.13*   CBG (last 3)  No results for input(s): GLUCAP in the last 72 hours. Recent Results (from the past 240 hour(s))  Blood Culture (routine x 2)     Status: None (Preliminary result)   Collection Time: 06/23/16  8:28 AM  Result Value Ref Range Status   Specimen Description BLOOD RIGHT ANTECUBITAL  Final   Special Requests BOTTLES DRAWN AEROBIC AND ANAEROBIC  5CC  Final   Culture NO GROWTH 2 DAYS  Final   Report Status PENDING  Incomplete  Urine culture     Status: None   Collection Time: 06/23/16  8:31 AM  Result Value Ref Range Status   Specimen Description URINE, RANDOM  Final   Special Requests NONE  Final   Culture NO GROWTH  Final    Report Status 06/24/2016 FINAL  Final  Blood Culture (routine x 2)     Status: None (Preliminary result)   Collection Time: 06/23/16  8:35 AM  Result Value Ref Range Status   Specimen Description BLOOD RIGHT ANTECUBITAL  Final   Special Requests BOTTLES DRAWN AEROBIC ONLY  5CC  Final   Culture NO GROWTH 2 DAYS  Final   Report Status PENDING  Incomplete     Studies: Dg Chest 2 View  Result Date: 06/24/2016 CLINICAL DATA:  Acute CHF, shortness of breath, cardiomyopathy, cirrhosis, chronic renal insufficiency EXAM: CHEST  2 VIEW COMPARISON:  PA and lateral chest x-ray of June 23, 2016 FINDINGS: There remains volume loss on the right with elevation of the hemidiaphragm. The left lung is adequately inflated. Coarse interstitial markings are present in the lower left lung. There is partial obscuration of the hemidiaphragm. The cardiac silhouette is enlarged. The pulmonary vascularity is mildly prominent centrally. IMPRESSION: Allowing for differences in positioning there has not been significant interval change since yesterday's study. Persistent left basilar atelectasis or pneumonia with trace left pleural effusion. Stable cardiomegaly. Electronically Signed   By: David  Martinique M.D.   On: 06/24/2016 07:52    Scheduled Meds: . amLODipine  10 mg Oral Daily  . aspirin EC  81 mg Oral Daily  . clopidogrel  75 mg Oral Daily  . enoxaparin (LOVENOX) injection  30 mg Subcutaneous Q24H  . furosemide  60 mg Intravenous Q12H  . isosorbide mononitrate  30 mg Oral Daily  . metoprolol succinate  75 mg Oral Daily  . multivitamin with minerals  1 tablet Oral Daily  . pantoprazole  40 mg Oral BID  . piperacillin-tazobactam (ZOSYN)  IV  3.375 g Intravenous Q8H  . simvastatin  20 mg Oral QHS  . sodium chloride flush  3 mL Intravenous Q12H  . vancomycin  750 mg Intravenous Q24H   Continuous Infusions:   Principal Problem:   Acute respiratory failure with hypoxia (HCC) Active Problems:   Elevated  troponin   Hyperlipidemia   HYPERTENSION, BENIGN   Hypertrophic obstructive cardiomyopathy (HCC)   Bilateral lower extremity edema   Alcoholic cirrhosis (HCC)   Aortic stenosis, mild   Memory loss   CKD (chronic  kidney disease) stage 3, GFR 30-59 ml/min   Iron deficiency anemia due to chronic blood loss   Acute combined systolic and diastolic CHF, NYHA class 1 (HCC)   HCAP (healthcare-associated pneumonia)   Time spent:   Irwin Brakeman, MD, FAAFP [facility] Pager [phone]  If 7PM-7AM, please contact night-coverage [email or web address] Password [email or web address] 06/25/2016, 12:59 PM    LOS: 2 days

## 2016-06-25 NOTE — Consult Note (Signed)
Advanced Heart Failure Team Consult Note  Referring Physician: Dr. Murvin Natal Primary Physician: [name], MD Primary Cardiologist:  Dr. 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  Evaluation revealed findings of a non-STEMI secondary to demand ischemia in setting of chronic kidney disease. Myoview 06/13/16 showed evidence of apparent prior infarct without reversible ischemia.  Echo 06/15/16 LVEF 45-50% with Grade 1 DD. Mild AS + AI.   Treated for cellulitis in ER 06/17/16.  Presented 06/23/16 with worsening SOB and peripheral edema. Pertinent labs on admission include K 4.7, Creatinine 1.84, BNP 1426. WBC 16.2 and lactic acid 0.59. UA unremarkable. BCx and UCx NGTD as of 06/25/16. CXR shows persistent left lower lobe consolidation with mild underlying interstitial prominence. Received dose of vanc/zosyn.  De-escelated today. Remains on Doxycycline .  He feels OK today. States he has been more SOB over the past few months.  He does have occasional CP, not related to his SOB. Denies orthopnea, Palpitations, lightheadedness, or dizziness. States he pees well at home on Lasix.  WOC following for leg wounds.  He states he still would not want to have catheterization.  His priority is to get home. He lives in a retirement community here in [place].    Review of Systems: [y] = yes, [ ]  = no   General: Weight gain [y]; Weight loss [ ] ; Anorexia [ ] ; Fatigue [ ] ; Fever [ ] ; Chills [ ] ; Weakness Blue.Reese ]  Cardiac: Chest pain/pressure [ ] ; Resting SOB [ ] ; Exertional SOB [y]; Orthopnea [ ] ; Pedal Edema [y]; Palpitations  [ ] ; Syncope [ ] ; Presyncope [ ] ; Paroxysmal nocturnal dyspnea[ ]   Pulmonary: Cough [ ] ; Wheezing[ ] ; Hemoptysis[ ] ; Sputum [ ] ; Snoring [ ]   GI: Vomiting[ ] ; Dysphagia[ ] ; Melena[ ] ; Hematochezia [ ] ; Heartburn[ ] ; Abdominal pain [ ] ; Constipation [ ] ; Diarrhea [ ] ; BRBPR [ ]   GU: Hematuria[ ] ; Dysuria [ ] ; Nocturia[ ]   Vascular: Pain in legs with walking [ ] ; Pain in feet with lying flat [ ] ; Non-healing sores [y]; Stroke [ ] ; TIA [ ] ; Slurred speech [ ] ;  Neuro: Headaches[ ] ; Vertigo[ ] ; Seizures[ ] ; Paresthesias[ ] ;Blurred vision [ ] ; Diplopia [ ] ; Vision changes [ ]   Ortho/Skin: Arthritis [y]; Joint pain [y]; Muscle pain [ ] ; Joint swelling [ ] ; Back Pain [ ] ; Rash [ ]   Psych: Depression[ ] ; Anxiety[ ]   Heme: Bleeding problems [ ] ; Clotting disorders [ ] ; Anemia [ ]   Endocrine: Diabetes [ ] ; Thyroid dysfunction[ ]   Home Medications Prior to Admission medications   Medication Sig Start Date End Date Taking? Authorizing Provider  acetaminophen (TYLENOL) 325 MG tablet Take 650 mg by mouth every 6 (six) hours as needed for pain.   Yes Historical Provider, MD  ALPRAZolam (XANAX) 0.25 MG tablet Take 1 tablet (0.25 mg total) by mouth at bedtime as needed for anxiety. 05/19/16  Yes Biagio Borg, MD  amLODipine (NORVASC) 10 MG tablet Take 1 tablet (10 mg total) by mouth daily. 01/27/16  Yes Burnell Blanks, MD  aspirin EC  81 MG EC tablet Take 1 tablet (81 mg total) by mouth daily. 06/15/16  Yes Robbie Lis, MD  clopidogrel (PLAVIX) 75 MG tablet Take 1 tablet (75 mg total) by mouth daily. 06/15/16  Yes Robbie Lis, MD  doxycycline (VIBRAMYCIN) 100 MG capsule Take 1 capsule (100 mg total) by mouth 2 (two) times daily. One po bid x 7 days 06/17/16  Yes April Palumbo, MD  furosemide (LASIX) 40 MG tablet Take 1 tablet (40 mg total) by mouth daily. 05/25/16  Yes Biagio Borg, MD  isosorbide mononitrate (IMDUR) 30 MG 24 hr tablet TAKE ONE TABLET EACH DAY Patient taking differently: Take 30 mg by mouth  daily 06/02/16  Yes Biagio Borg, MD  metoprolol succinate (TOPROL-XL) 25 MG 24 hr tablet Take 3 tablets (75 mg total) by mouth daily. Take with or immediately following a meal. 06/15/16  Yes Robbie Lis, MD  Multiple Vitamin (MULTIVITAMIN WITH MINERALS) TABS Take 1 tablet by mouth daily.   Yes Historical Provider, MD  pantoprazole (PROTONIX) 40 MG tablet Take 1 tablet (40 mg total) by mouth 2 (two) times daily. 05/02/16  Yes Clanford Marisa Hua, MD  simvastatin (ZOCOR) 20 MG tablet TAKE ONE TABLET AT BEDTIME Patient taking differently: Take 20 mg by mouth at bedtime 06/17/16  Yes Biagio Borg, MD  zolpidem (AMBIEN) 5 MG tablet TAKE ONE TABLET AT BEDTIME AS NEEDED FORSLEEP Patient taking differently: Take 5 mg by mouth at bedtime as needed for sleep.  05/25/16  Yes Golden Circle, FNP    Past Medical History: Past Medical History:  Diagnosis Date  . Acute combined systolic and diastolic CHF, NYHA class 1 ([place])   . Acute respiratory failure ([place])   . Alcoholic cirrhosis ([place])   . 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 Alcoholism ([place])   . Aortic stenosis   . Ascites 11/2004   "early" SBP on paracentesis.   . Chronic gastritis 05/01/2016  . CKD (chronic kidney disease), stage III 10/11/2015  . Colon polyp perhaps in 1990s   pathology/type not known  . Diverticulosis of colon perhaps in 1990s  . Fracture of wrist 2014 spring vs early summer   refused orthopods suggestion to set the bone.   Marland Kitchen GERD (gastroesophageal reflux disease)   . HLD (hyperlipidemia)   . HTN (hypertension)   . Hypertrophic cardiomyopathy ([place])   . Portal hypertensive gastropathy 2006  . Renal insufficiency   . Rhabdomyolysis 01/2005    Past Surgical History: Past Surgical History:  Procedure Laterality Date  . BASAL CELL CARCINOMA EXCISION Left 11/2012   lower eyelid  . ESOPHAGOGASTRODUODENOSCOPY N/A 08/04/2013   Procedure: ESOPHAGOGASTRODUODENOSCOPY (EGD);  Surgeon: Ladene Artist, MD;  Location: [facility] ENDOSCOPY;   Service: Endoscopy;  Laterality: N/A;  . ESOPHAGOGASTRODUODENOSCOPY (EGD) WITH PROPOFOL N/A 05/01/2016   Procedure: ESOPHAGOGASTRODUODENOSCOPY (EGD) WITH PROPOFOL;  Surgeon: Jerene Bears, MD;  Location: [facility] ENDOSCOPY;  Service: Endoscopy;  Laterality: N/A;  . INGUINAL HERNIA REPAIR    . MELANOMA EXCISION Left 02/2011   malignant melanoma of thigh    Family History: Family History  Problem Relation Age of Onset  . Cancer Father 91    lung cancer  . Heart disease Mother   . Heart disease Sister     Social History: Social History   Social History  . Marital status: Married    Spouse name: N/A  . Number of children: 4  . Years of education: 24   Occupational History  . pathologist  Retired    retired   Social History Main Topics  . Smoking status: Never Smoker  . Smokeless tobacco: Never Used  . Alcohol use No     Comment: member of AA  . Drug use: No  . Sexual activity: Not Asked   Other Topics Concern  . 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  Remains independent. ACP- not discussed, will approach at next OV    Allergies:  No Known Allergies  Objective:    Vital Signs:   Temp:  [97.4 F (36.3 C)-97.6 F (36.4 C)] 97.6 F (36.4 C) (08/17 1333) Pulse Rate:  [77-85] 77 (08/17 1333) Resp:  [18] 18 (08/17 1333) BP: (114-136)/(46-65) 114/46 (08/17 1333) SpO2:  [96 %-97 %] 96 % (08/17 1333) Weight:  [172 lb 8 oz (78.2 kg)] 172 lb 8 oz (78.2 kg) (08/17 0453) Last BM Date: 06/24/16  Weight change: Filed Weights   06/23/16 1028 06/24/16 0423 06/25/16 0453  Weight: 175 lb 8 oz (79.6 kg) 174 lb 14.4 oz (79.3 kg) 172 lb 8 oz (78.2 kg)    Intake/Output:   Intake/Output Summary (Last 24 hours) at 06/25/16 1403 Last data filed at 06/25/16 1332  Gross per 24 hour  Intake             1052 ml  Output              227 ml  Net              825 ml     Physical Exam: General:  Well appearing. No resp difficulty HEENT:  normal Neck: supple. JVP elevated to ear. Carotids 2+ bilat; no bruits. No lymphadenopathy or thyromegaly appreciated. Cor: PMI nondisplaced. Regular rate & rhythm. No rubs, gallops. 2/6 AS murmur. Lungs: Crackles throughout, marked in bases. Diminished R>L  Abdomen: soft, NT, ND, no HSM. No bruits or masses. +BS  Extremities: no cyanosis, clubbing, rash. 3+ edema nearly to flanks. UNNA boots in place.  Neuro: alert & orientedx3, cranial nerves grossly intact. moves all 4 extremities w/o difficulty. Affect pleasant GU: Markedly edematous scrotum  Telemetry: Reviewed personally, NSR  Labs: Basic Metabolic Panel:  Recent Labs Lab 06/23/16 0705 06/23/16 1242 06/24/16 0538 06/25/16 0418  NA 138  --  140 140  K 4.7  --  4.3 3.9  CL 109  --  111 108  CO2 18*  --  20* 22  GLUCOSE 135*  --  145* 136*  BUN 40*  --  41* 45*  CREATININE 1.84*  --  1.98* 2.35*  CALCIUM 9.0  --  8.7* 8.7*  MG  --  1.8  --   --     Liver Function Tests: No results for input(s): AST, ALT, ALKPHOS, BILITOT, PROT, ALBUMIN in the last 168 hours. No results for input(s): LIPASE, AMYLASE in the last 168 hours. No results for input(s): AMMONIA in the last 168 hours.  CBC:  Recent Labs Lab 06/23/16 0705 06/24/16 1406 06/25/16 0418  WBC 16.2* 13.8* 13.7*  NEUTROABS 12.1*  --   --   HGB 11.1* 9.9* 10.1*  HCT 35.2* 32.1* 32.8*  MCV 100.0 101.3* 101.2*  PLT 369 291 295    Cardiac Enzymes:  Recent Labs Lab 06/23/16 0705  TROPONINI 0.13*    BNP: BNP (last 3 results)  Recent Labs  04/13/16 1046 06/12/16 0556 06/23/16 0705  BNP 107.2* 637.8* 1,426.3*    ProBNP (last 3 results)  Recent Labs  07/22/15 1536  PROBNP 61.0     CBG: No results for input(s): GLUCAP in the last 168 hours.  Coagulation Studies: No results for input(s): LABPROT, INR in the last 72 hours.  Other results: EKG: NSR 88 bpm RBBB with AS q waves  Imaging: Dg Chest 2 View  Result Date: 06/24/2016 CLINICAL  DATA:  Acute CHF, shortness of breath, cardiomyopathy, cirrhosis, chronic renal insufficiency EXAM: CHEST  2 VIEW COMPARISON:  PA and lateral chest x-ray of June 23, 2016 FINDINGS: There remains volume loss on the right with elevation of the hemidiaphragm. The left lung is adequately inflated. Coarse interstitial markings are present in the lower left lung. There is partial obscuration of the hemidiaphragm. The cardiac silhouette is enlarged. The pulmonary vascularity is mildly prominent centrally. IMPRESSION: Allowing for differences in positioning there has not been significant interval change since yesterday's study. Persistent left basilar atelectasis or pneumonia with trace left pleural effusion. Stable cardiomegaly. Electronically Signed   By: David  Martinique M.D.   On: 06/24/2016 07:52      Medications:     Current Medications: . amLODipine  10 mg Oral Daily  . aspirin EC  81 mg Oral Daily  . clopidogrel  75 mg Oral Daily  . doxycycline  100 mg Oral Q12H  . enoxaparin (LOVENOX) injection  30 mg Subcutaneous Q24H  . furosemide  60 mg Intravenous Q12H  . isosorbide mononitrate  30 mg Oral Daily  . metoprolol succinate  75 mg Oral Daily  . multivitamin with minerals  1 tablet Oral Daily  . pantoprazole  40 mg Oral BID  . simvastatin  20 mg Oral QHS  . sodium chloride flush  3 mL Intravenous Q12H     Infusions:      Assessment   1. Acute on chronic combined CHF, ICM. EF ~40-45% with mid to distal anterior, anteroseptal and apical HK 2. HCAP 3. CAD with previous silent LAD infarct 4. Aortic Stenosis, Mild 5. Alcoholic Cirrhosis 6. AKI on CKD stage 3 7. RBBB   Plan   He is markedly volume overloaded on exam. Will increase lasix to 80 mg BID.  Follow renal function and electrolytes closely.   Echo 06/15/16 LVEF 45-50% with Grade 1 DD. Mild AS + AI. EF decreased from 60-65% in June. Likely ischemic with recent NSTEMI, although patient refused catheterization.  With age,  co-morbidities, and recent refusal of LHC. Will need to discuss [place] while in hospital.   BCx and UCx NGTD.  Remains on Doxycycline per primary team.   Length of Stay: 2  [name] PA-C 06/25/2016, 2:03 PM  Advanced Heart Failure Team Pager [phone] (M-F; 7a - 4p)  Please contact [facility] for night-coverage after hours (4p -7a ) and weekends on [email or web address]  Patient seen and examined with Oda Kilts, PA-C. We discussed all aspects of the encounter. I agree with the assessment and plan as stated above.   Echo and nuclear study images reviewed personally. 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 Who is admitted with massive volume overload. He currently denies angina and is not good cath candidate due to poor renal function (he refuses anyway). He unfortunately recently lost his wife several months ago to an intracerebral hemorrhage and he now lives alone at [place]. He will need close to a week of IV diuresis but he says he is unwilling to stay more than 2-3 days. Will diurese  aggressively with IV lasix and metolazone. Watch renal function and electrolytes carefully. Stop amlodipine.  We will follow with you.   Bensimhon, Daniel,MD 1:09 AM

## 2016-06-25 NOTE — Evaluation (Signed)
Physical Therapy Evaluation Patient Details Name: [name] MRN: [id number] DOB: 1928/11/15 Today's Date: 06/25/2016   History of Present Illness  [name] is a 82 y.o. male with acute respiratory failure and continued elevated troponin from recent NSTEMI was admitted, has PNA as well. On 4L O2 and declining sats with effort.  Past Medical History of HTN, Hypertrophic cardiomyopathy, HLD, ETOH cirrhosis, GERD, PUD, diverticulosis, CKD, NSTEMI.  Clinical Impression  Pt is up to walk with significant decline of O2 on 4L O2, down from 97% to 87% with walk.  Have advised him to limit this and will work on LE strengthening and endurance acutely to decrease his SNF stay.      Follow Up Recommendations SNF    Equipment Recommendations  None recommended by PT    Recommendations for Other Services Rehab consult     Precautions / Restrictions Precautions Precautions: Fall (telemetry) Restrictions Weight Bearing Restrictions: No      Mobility  Bed Mobility Overal bed mobility: Modified Independent                Transfers Overall transfer level: Needs assistance Equipment used: Rolling walker (2 wheeled);1 person hand held assist Transfers: Sit to/from [facility] Sit to Stand: Min guard Stand pivot transfers: Min guard       General transfer comment: reminded hand placement, pt is not attending to lines for IV and O2  Ambulation/Gait Ambulation/Gait assistance: Min guard Ambulation Distance (Feet): 40 Feet Assistive device: Rolling walker (2 wheeled);1 person hand held assist Gait Pattern/deviations: Step-through pattern;Step-to pattern;Wide base of support;Trunk flexed;Shuffle Gait velocity: reduced Gait velocity interpretation: Below normal speed for age/gender General Gait Details: pt is using extended O2 line due to his nursing cautions about his SOB (due to limited gait, with pregait saturations 97%, post 87%)  Stairs             Wheelchair Mobility    Modified Rankin (Stroke Patients Only)       Balance Overall balance assessment: Needs assistance Sitting-balance support: Feet supported Sitting balance-Leahy Scale: Good   Postural control: Posterior lean Standing balance support: Bilateral upper extremity supported Standing balance-Leahy Scale: Fair Standing balance comment: less than fair dynamic balance                             Pertinent Vitals/Pain Pain Assessment: No/denies pain    Home Living Family/patient expects to be discharged to:: Other (Comment)                 Additional Comments: [place] at [facility]    Prior Function Level of Independence: Independent with assistive device(s)         Comments: uses scooter to go to dining room, cane in apartment     Hand Dominance   Dominant Hand: Right    Extremity/Trunk Assessment   Upper Extremity Assessment: Generalized weakness           Lower Extremity Assessment: Generalized weakness      Cervical / Trunk Assessment: Kyphotic  Communication   Communication: HOH  Cognition Arousal/Alertness: Awake/alert Behavior During Therapy: Impulsive Overall Cognitive Status: History of cognitive impairments - at baseline       Memory: Decreased short-term memory;Decreased recall of precautions              General Comments General comments (skin integrity, edema, etc.): [place] living [place] at [place] and will need assist upon return, a  good candidate for SNF there    Exercises        Assessment/Plan    PT Assessment Patient needs continued PT services  PT Diagnosis Generalized weakness;Difficulty walking   PT Problem List Decreased strength;Decreased range of motion;Decreased activity tolerance;Decreased balance;Decreased mobility;Decreased coordination;Decreased cognition;Decreased knowledge of use of DME;Decreased safety awareness;Decreased knowledge of precautions;Cardiopulmonary status  limiting activity;Decreased skin integrity  PT Treatment Interventions DME instruction;Gait training;Functional mobility training;Therapeutic activities;Therapeutic exercise;Balance training;Neuromuscular re-education;Patient/family education   PT Goals (Current goals can be found in the Care Plan section) Acute Rehab PT Goals Patient Stated Goal: to get back to [facility] PT Goal Formulation: With patient Time For Goal Achievement: 07/09/16 Potential to Achieve Goals: Good    Frequency Min 2X/week   Barriers to discharge Decreased caregiver support home alone    Co-evaluation               End of Session Equipment Utilized During Treatment: Oxygen;Gait belt Activity Tolerance: Patient tolerated treatment well;Treatment limited secondary to medical complications (Comment) (O2 sats dropped with effort) Patient left: in bed;with call bell/phone within reach;with nursing/sitter in room (sitting side of bed to eat breakfast) Nurse Communication: Mobility status;Other (comment) (O2 sats)         Time: [id number] PT Time Calculation (min) (ACUTE ONLY): 15 min   Charges:   PT Evaluation $PT Eval Low Complexity: 1 Procedure     PT G CodesRamond Dial 07-23-16, 8:36 AM    Mee Hives, PT MS Acute Rehab Dept. Number: [place] [facility] [place]

## 2016-06-26 DIAGNOSIS — I350 Nonrheumatic aortic (valve) stenosis: Secondary | ICD-10-CM

## 2016-06-26 DIAGNOSIS — N179 Acute kidney failure, unspecified: Secondary | ICD-10-CM

## 2016-06-26 LAB — BASIC METABOLIC PANEL
Anion gap: 10 (ref 5–15)
BUN: 50 mg/dL — ABNORMAL HIGH (ref 6–20)
CALCIUM: 8.6 mg/dL — AB (ref 8.9–10.3)
CO2: 22 mmol/L (ref 22–32)
CREATININE: 2.82 mg/dL — AB (ref 0.61–1.24)
Chloride: 109 mmol/L (ref 101–111)
GFR, EST AFRICAN AMERICAN: 22 mL/min — AB (ref >60)
GFR, EST NON AFRICAN AMERICAN: 19 mL/min — AB (ref >60)
Glucose, Bld: 135 mg/dL — ABNORMAL HIGH (ref 65–99)
Potassium: 3.8 mmol/L (ref 3.5–5.1)
SODIUM: 141 mmol/L (ref 135–145)

## 2016-06-26 LAB — CBC
HCT: 31.3 % — ABNORMAL LOW (ref 39.0–52.0)
HEMOGLOBIN: 9.7 g/dL — AB (ref 13.0–17.0)
MCH: 31.3 pg (ref 26.0–34.0)
MCHC: 31 g/dL (ref 30.0–36.0)
MCV: 101 fL — ABNORMAL HIGH (ref 78.0–100.0)
Platelets: 264 10*3/uL (ref 150–400)
RBC: 3.1 MIL/uL — ABNORMAL LOW (ref 4.22–5.81)
RDW: 15.1 % (ref 11.5–15.5)
WBC: 11.5 10*3/uL — ABNORMAL HIGH (ref 4.0–10.5)

## 2016-06-26 MED ORDER — MILRINONE LACTATE IN DEXTROSE 20-5 MG/100ML-% IV SOLN
0.1250 ug/kg/min | INTRAVENOUS | Status: AC
  Administered 2016-06-26 – 2016-07-01 (×5): 0.125 ug/kg/min via INTRAVENOUS
  Filled 2016-06-26 (×6): qty 100

## 2016-06-26 NOTE — Progress Notes (Signed)
  Creatinine 2.3 to 2.8 and only out 227 cc yesterday.  Hold metolazone for now. Will add milrinone 0.25 mcg/kg/min to augment diuresis.   Discussed case with Dr. Gareth Morgan "[address] [place], [place] 06/26/2016 7:24 AM

## 2016-06-26 NOTE — Progress Notes (Signed)
Pt voids in toilet and refuses to use a urinal.

## 2016-06-26 NOTE — Progress Notes (Signed)
PROGRESS NOTE    [name]  [id number]  DOB: 1929/04/29  DOA: 06/23/2016 PCP: [name], MD Outpatient Specialists:  Hospital course: ABDU BARNOSKY is a 82 y.o. male with a Past Medical History of alcoholic cirrhosis, CKD, diverticulosis, GERD, HLD, HTN, cardiomyopathy, renal insufficiency, portal HTN who presents with acute respiratory failure secondary to HCAP, and CHF decompensation. On admission:  O2 sats 89% on RA and tachypneic.    Assessment & Plan:     Acute respiratory failure with hypoxia:  A) Hypertrophic obstructive cardiomyopathy /  Acute on chronic combined systolic and diastolic CHF, NYHA class 1  B) HCAP (healthcare-associated pneumonia) -Patient presents with acute onset of shortness of breath and associated elevated BNP and worsening lower extremity edema concerning for heart failure exacerbation; patient poor historian given dementia but son relates that last week staff noticed patient with dyspnea as well -He also has x-ray findings that could be consistent with a pneumonia process and this is associated with leukocytosis  -Lasix 80 mg IV every 8 hours ordered by HF team.  -Weight has increased from 145 pounds in June to 165 pounds in July and admission weight is up to 177 pounds -Continue preadmission Imdur, Toprol -No ACE I/ARB secondary to chronic kidney disease -Echocardiogram completed last admission: Echocardiogram completed last admission: EF 45-50% with hypokinesis of the anteroseptal and apical myocardium, grade 1 diastolic dysfunction, very mild aortic stenosis  -Daily weights/intake and output, consulted Heart Failure Team.  Pt started on IV milrinone 8/18.  Pt hesitant about starting it but eventually agreed.  -Broad-spectrum empiric antibiotics with vancomycin and Zosyn with pharmacy dosing, de-escalated 8/17 to doxycycline.  Repeat CXR in AM.  -Follow up on blood cultures and sputum culture -Continue supportive care with oxygen -Early  mobilization with physical therapy but patient is very deconditioned and gets very SOB with ambulation.      HYPERTENSION, Essential -Imdur and Toprol as above, norvasc discontinued.    Chronic Bilateral lower extremity edema -Has skin changes concerning for possible cellulitis -Antibiotics as above -Possible component of heart failure contributing -UNNA boot in place prior to admission-consult WOC RN   CKD (chronic kidney disease) stage 3, GFR 30-59 ml/min -Follow electrolytes closely with diuresis -Bump in creatinine noted, may need to cut back on diuresis    Elevated troponin -Mild elevation in troponin at 0.13 which actually demonstrates a downward trend from previous admission peak of 0.81 -Patient currently denying chest pain -Evaluated by cardiology last admission and underwent nuclear medicine Myoview scan. Cardiology documented that the patient was not known to have CAD but has not had ischemic evaluation prior to last admission. The nuclear stress test was consistent with possible old infarction but no acute ischemia. EF on stress test was 38% and EF on echo was 45-50%. Patient refused cardiac catheterization during previous admission. Cardiology opted to manage his non-STEMI with aspirin, beta blocker and Plavix and we have continued at time of admission today    Hyperlipidemia -Continue statin    Alcoholic cirrhosis  -Apparently no longer consuming alcoholic beverages and during previous admission LFTs as well as total bilirubin were normal, but very concerned about his profound fluid weight gain since recent hospitalization, I don't believe that he can independently care for himself any longer.  I think he needs SNF but patient is very against it at this time.      Aortic stenosis, mild -Asymptomatic   Dementia -Does not carry a formal diagnosis of dementia and uncertain if memory  loss more related to prior alcoholism -Also was not on disease modifying  medications -Patient's wife died several weeks ago and son who has assumed the POA roll since patient's wife was POA    Iron deficiency anemia due to chronic blood loss -Hemoglobin stable and around baseline between 10.3 and 11.1   DVT prophylaxis: Lovenox  Code Status: DO NOT RESUSCITATE  Family Communication: Son, Tremond Prada at bedside  Disposition Plan: Anticipate discharge back to independent living facility  Consults called: None Admission status: Inpatient/telemetry    Antimicrobials: Anti-infectives    Start     Dose/Rate Route Frequency Ordered Stop   06/25/16 1400  doxycycline (VIBRA-TABS) tablet 100 mg     100 mg Oral Every 12 hours 06/25/16 1300     06/24/16 1000  vancomycin (VANCOCIN) IVPB 750 mg/150 ml premix  Status:  Discontinued     750 mg 150 mL/hr over 60 Minutes Intravenous Every 24 hours 06/23/16 0903 06/25/16 1300   06/23/16 1500  piperacillin-tazobactam (ZOSYN) IVPB 2.25 g  Status:  Discontinued     2.25 g 100 mL/hr over 30 Minutes Intravenous Every 6 hours 06/23/16 0934 06/23/16 0959   06/23/16 1500  piperacillin-tazobactam (ZOSYN) IVPB 3.375 g  Status:  Discontinued     3.375 g 12.5 mL/hr over 240 Minutes Intravenous Every 6 hours 06/23/16 1000 06/23/16 1006   06/23/16 1500  piperacillin-tazobactam (ZOSYN) IVPB 3.375 g  Status:  Discontinued     3.375 g 12.5 mL/hr over 240 Minutes Intravenous Every 8 hours 06/23/16 1006 06/25/16 1300   06/23/16 0845  piperacillin-tazobactam (ZOSYN) IVPB 3.375 g     3.375 g 100 mL/hr over 30 Minutes Intravenous  Once 06/23/16 0832 06/23/16 0930   06/23/16 0845  vancomycin (VANCOCIN) 1,500 mg in sodium chloride 0.9 % 500 mL IVPB     1,500 mg 250 mL/hr over 120 Minutes Intravenous  Once 06/23/16 0838 06/23/16 1130     Subjective: Pt says that he is not feeling as well today and weight only slightly down. Started IV milrinone today per HF team.   Objective: Vitals:   06/25/16 2020 06/26/16 0550 06/26/16 1145  06/26/16 1400  BP: (!) 122/58 137/67 (!) 133/55 (!) 132/56  Pulse: 72 89 75 81  Resp: 18 18    Temp: 97.8 F (36.6 C) 97.7 F (36.5 C)    TempSrc: Oral Oral    SpO2: 99% 94%    Weight:  78.1 kg (172 lb 1.6 oz)    Height:        Intake/Output Summary (Last 24 hours) at 06/26/16 1556 Last data filed at 06/26/16 1505  Gross per 24 hour  Intake           129.76 ml  Output              551 ml  Net          -421.24 ml   Filed Weights   06/24/16 0423 06/25/16 0453 06/26/16 0550  Weight: 79.3 kg (174 lb 14.4 oz) 78.2 kg (172 lb 8 oz) 78.1 kg (172 lb 1.6 oz)   Exam:  General exam: awake, alert, sitting up in chair, no apparent distress.  Respiratory system: bibasilar crackles.  Cardiovascular system: S1 & S2 heard. No JVD, murmurs, gallops, clicks or pedal edema. Gastrointestinal system: Abdomen is nondistended, soft and nontender. Normal bowel sounds heard. Central nervous system: Alert and oriented. No focal neurological deficits. Extremities: 2+ edema bilateral LEs. Severe scrotal edema noted.   Data Reviewed:  Basic Metabolic Panel:  Recent Labs Lab 06/23/16 0705 06/23/16 1242 06/24/16 0538 06/25/16 0418 06/26/16 0224  NA 138  --  140 140 141  K 4.7  --  4.3 3.9 3.8  CL 109  --  111 108 109  CO2 18*  --  20* 22 22  GLUCOSE 135*  --  145* 136* 135*  BUN 40*  --  41* 45* 50*  CREATININE 1.84*  --  1.98* 2.35* 2.82*  CALCIUM 9.0  --  8.7* 8.7* 8.6*  MG  --  1.8  --   --   --    Liver Function Tests: No results for input(s): AST, ALT, ALKPHOS, BILITOT, PROT, ALBUMIN in the last 168 hours. No results for input(s): LIPASE, AMYLASE in the last 168 hours. No results for input(s): AMMONIA in the last 168 hours. CBC:  Recent Labs Lab 06/23/16 0705 06/24/16 1406 06/25/16 0418 06/26/16 0224  WBC 16.2* 13.8* 13.7* 11.5*  NEUTROABS 12.1*  --   --   --   HGB 11.1* 9.9* 10.1* 9.7*  HCT 35.2* 32.1* 32.8* 31.3*  MCV 100.0 101.3* 101.2* 101.0*  PLT 369 291 295 264    Cardiac Enzymes:  Recent Labs Lab 06/23/16 0705  TROPONINI 0.13*   CBG (last 3)  No results for input(s): GLUCAP in the last 72 hours. Recent Results (from the past 240 hour(s))  Blood Culture (routine x 2)     Status: None (Preliminary result)   Collection Time: 06/23/16  8:28 AM  Result Value Ref Range Status   Specimen Description BLOOD RIGHT ANTECUBITAL  Final   Special Requests BOTTLES DRAWN AEROBIC AND ANAEROBIC  5CC  Final   Culture NO GROWTH 3 DAYS  Final   Report Status PENDING  Incomplete  Urine culture     Status: None   Collection Time: 06/23/16  8:31 AM  Result Value Ref Range Status   Specimen Description URINE, RANDOM  Final   Special Requests NONE  Final   Culture NO GROWTH  Final   Report Status 06/24/2016 FINAL  Final  Blood Culture (routine x 2)     Status: None (Preliminary result)   Collection Time: 06/23/16  8:35 AM  Result Value Ref Range Status   Specimen Description BLOOD RIGHT ANTECUBITAL  Final   Special Requests BOTTLES DRAWN AEROBIC ONLY  5CC  Final   Culture NO GROWTH 3 DAYS  Final   Report Status PENDING  Incomplete     Studies: No results found.  Scheduled Meds: . aspirin EC  81 mg Oral Daily  . clopidogrel  75 mg Oral Daily  . doxycycline  100 mg Oral Q12H  . enoxaparin (LOVENOX) injection  30 mg Subcutaneous Q24H  . furosemide  80 mg Intravenous TID  . isosorbide mononitrate  30 mg Oral Daily  . metoprolol succinate  75 mg Oral Daily  . multivitamin with minerals  1 tablet Oral Daily  . pantoprazole  40 mg Oral BID  . simvastatin  20 mg Oral QHS  . sodium chloride flush  3 mL Intravenous Q12H   Continuous Infusions: . milrinone 0.125 mcg/kg/min (06/26/16 1143)    Principal Problem:   Acute respiratory failure with hypoxia (HCC) Active Problems:   Elevated troponin   Hyperlipidemia   HYPERTENSION, BENIGN   Hypertrophic obstructive cardiomyopathy (HCC)   Bilateral lower extremity edema   Alcoholic cirrhosis (HCC)    Aortic stenosis, mild   Memory loss   CKD (chronic kidney disease) stage 3, GFR  30-59 ml/min   Iron deficiency anemia due to chronic blood loss   Acute combined systolic and diastolic CHF, NYHA class 1 (HCC)   HCAP (healthcare-associated pneumonia)   Time spent:   Irwin Brakeman, MD, FAAFP [facility] Pager [phone]  If 7PM-7AM, please contact night-coverage [email or web address] Password [email or web address] 06/26/2016, 3:56 PM    LOS: 3 days

## 2016-06-26 NOTE — Care Management Important Message (Signed)
Important Message  Patient Details  Name: [name] MRN: [id number] Date of Birth: 05-16-1929   Medicare Important Message Given:  Yes    Trejan Buda [address] 06/26/2016, 10:31 AM

## 2016-06-26 NOTE — Progress Notes (Signed)
Late note:  Pt's rhythm changed to A-flutter earlier, did an ECG, which did not show this, it showed instead NSR with PVC's and R BBB, no s/s though, notified MD, will continue to monitor, Thanks Arvella Nigh RN

## 2016-06-26 NOTE — Progress Notes (Signed)
Advanced Heart Failure Team Progress Note  Referring Physician: Dr. Murvin Natal Primary Physician: [name], MD Primary Cardiologist:  Dr. Angelena Form  Reason for Consultation: A/C combined CHF  HPI:    Feels about the same today. Hesitant to be started on milrinone.  Still maintaining he wants to be here 2-3 days at most.   Only out 227 cc total yesterday. I/O positive. Weight unchanged. Creatinine 2.3 -> 2.8  Home Medications Prior to Admission medications   Medication Sig Start Date End Date Taking? Authorizing Provider  acetaminophen (TYLENOL) 325 MG tablet Take 650 mg by mouth every 6 (six) hours as needed for pain.   Yes Historical Provider, MD  ALPRAZolam (XANAX) 0.25 MG tablet Take 1 tablet (0.25 mg total) by mouth at bedtime as needed for anxiety. 05/19/16  Yes Biagio Borg, MD  amLODipine (NORVASC) 10 MG tablet Take 1 tablet (10 mg total) by mouth daily. 01/27/16  Yes Burnell Blanks, MD  aspirin EC 81 MG EC tablet Take 1 tablet (81 mg total) by mouth daily. 06/15/16  Yes Robbie Lis, MD  clopidogrel (PLAVIX) 75 MG tablet Take 1 tablet (75 mg total) by mouth daily. 06/15/16  Yes Robbie Lis, MD  doxycycline (VIBRAMYCIN) 100 MG capsule Take 1 capsule (100 mg total) by mouth 2 (two) times daily. One po bid x 7 days 06/17/16  Yes April Palumbo, MD  furosemide (LASIX) 40 MG tablet Take 1 tablet (40 mg total) by mouth daily. 05/25/16  Yes Biagio Borg, MD  isosorbide mononitrate (IMDUR) 30 MG 24 hr tablet TAKE ONE TABLET EACH DAY Patient taking differently: Take 30 mg by mouth daily 06/02/16  Yes Biagio Borg, MD  metoprolol succinate (TOPROL-XL) 25 MG 24 hr tablet Take 3 tablets (75 mg total) by mouth daily. Take with or immediately following a meal. 06/15/16  Yes Robbie Lis, MD  Multiple Vitamin (MULTIVITAMIN WITH MINERALS) TABS Take 1 tablet by mouth daily.   Yes Historical Provider, MD  pantoprazole (PROTONIX) 40 MG tablet Take 1 tablet (40 mg total) by mouth 2 (two) times  daily. 05/02/16  Yes Clanford Marisa Hua, MD  simvastatin (ZOCOR) 20 MG tablet TAKE ONE TABLET AT BEDTIME Patient taking differently: Take 20 mg by mouth at bedtime 06/17/16  Yes Biagio Borg, MD  zolpidem (AMBIEN) 5 MG tablet TAKE ONE TABLET AT BEDTIME AS NEEDED FORSLEEP Patient taking differently: Take 5 mg by mouth at bedtime as needed for sleep.  05/25/16  Yes Golden Circle, FNP    Past Medical History: Past Medical History:  Diagnosis Date  . Acute combined systolic and diastolic CHF, NYHA class 1 ([place])   . Acute respiratory failure ([place])   . Alcoholic cirrhosis ([place])   . 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 Alcoholism ([place])   . Aortic stenosis   . Ascites 11/2004   "early" SBP on paracentesis.   . Chronic gastritis 05/01/2016  . CKD (chronic kidney disease), stage III 10/11/2015  . Colon polyp perhaps in 1990s   pathology/type not known  . Diverticulosis of colon perhaps in 1990s  . Fracture of wrist 2014 spring vs early summer   refused orthopods suggestion to set the bone.   Marland Kitchen GERD (gastroesophageal reflux disease)   . HLD (hyperlipidemia)   . HTN (hypertension)   . Hypertrophic cardiomyopathy ([place])   . Portal hypertensive gastropathy 2006  . Renal insufficiency   . Rhabdomyolysis 01/2005    Past Surgical History: Past Surgical  History:  Procedure Laterality Date  . BASAL CELL CARCINOMA EXCISION Left 11/2012   lower eyelid  . ESOPHAGOGASTRODUODENOSCOPY N/A 08/04/2013   Procedure: ESOPHAGOGASTRODUODENOSCOPY (EGD);  Surgeon: Ladene Artist, MD;  Location: [facility] ENDOSCOPY;  Service: Endoscopy;  Laterality: N/A;  . ESOPHAGOGASTRODUODENOSCOPY (EGD) WITH PROPOFOL N/A 05/01/2016   Procedure: ESOPHAGOGASTRODUODENOSCOPY (EGD) WITH PROPOFOL;  Surgeon: Jerene Bears, MD;  Location: [facility] ENDOSCOPY;  Service: Endoscopy;  Laterality: N/A;  . INGUINAL HERNIA REPAIR    . MELANOMA EXCISION Left 02/2011   malignant melanoma of thigh    Family History: Family History  Problem Relation  Age of Onset  . Cancer Father 65    lung cancer  . Heart disease Mother   . Heart disease Sister     Social History: Social History   Social History  . Marital status: Married    Spouse name: N/A  . Number of children: 4  . Years of education: 24   Occupational History  . pathologist Retired    retired   Social History Main Topics  . Smoking status: Never Smoker  . Smokeless tobacco: Never Used  . Alcohol use No     Comment: member of AA  . Drug use: No  . Sexual activity: Not Asked   Other Topics Concern  . 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  Remains independent. ACP- not discussed, will approach at next OV    Allergies:  No Known Allergies  Objective:    Vital Signs:   Temp:  [97.6 F (36.4 C)-97.8 F (36.6 C)] 97.7 F (36.5 C) (08/18 0550) Pulse Rate:  [72-89] 89 (08/18 0550) Resp:  [18] 18 (08/18 0550) BP: (114-137)/(46-67) 137/67 (08/18 0550) SpO2:  [94 %-99 %] 94 % (08/18 0550) Weight:  [172 lb 1.6 oz (78.1 kg)] 172 lb 1.6 oz (78.1 kg) (08/18 0550) Last BM Date: 06/25/16  Weight change: Filed Weights   06/24/16 0423 06/25/16 0453 06/26/16 0550  Weight: 174 lb 14.4 oz (79.3 kg) 172 lb 8 oz (78.2 kg) 172 lb 1.6 oz (78.1 kg)    Intake/Output:   Intake/Output Summary (Last 24 hours) at 06/26/16 1037 Last data filed at 06/26/16 0700  Gross per 24 hour  Intake              342 ml  Output              326 ml  Net               16 ml     Physical Exam: General:  Elderly appearing.  HEENT: normal Neck: supple. JVP elevated to ear. Carotids 2+ bilat; no bruits. No thyromegaly or nodule noted.  Cor: PMI nondisplaced. RRR. No rubs, gallops. 2/6 AS murmur. Lungs: Crackles throughout, marked in bases. Diminished R>L  Abdomen: soft, NT, ND, no HSM. No bruits or masses. +BS  Extremities: no cyanosis, clubbing, rash. 3+ edema nearly to flanks. UNNA boots in place.  Neuro: alert & orientedx3, cranial  nerves grossly intact. moves all 4 extremities w/o difficulty. Affect pleasant GU: Markedly edematous scrotum  Telemetry: Reviewed, NSR  Labs: Basic Metabolic Panel:  Recent Labs Lab 06/23/16 0705 06/23/16 1242 06/24/16 0538 06/25/16 0418 06/26/16 0224  NA 138  --  140 140 141  K 4.7  --  4.3 3.9 3.8  CL 109  --  111 108 109  CO2 18*  --  20* 22 22  GLUCOSE 135*  --  145* 136* 135*  BUN 40*  --  41* 45* 50*  CREATININE 1.84*  --  1.98* 2.35* 2.82*  CALCIUM 9.0  --  8.7* 8.7* 8.6*  MG  --  1.8  --   --   --     Liver Function Tests: No results for input(s): AST, ALT, ALKPHOS, BILITOT, PROT, ALBUMIN in the last 168 hours. No results for input(s): LIPASE, AMYLASE in the last 168 hours. No results for input(s): AMMONIA in the last 168 hours.  CBC:  Recent Labs Lab 06/23/16 0705 06/24/16 1406 06/25/16 0418 06/26/16 0224  WBC 16.2* 13.8* 13.7* 11.5*  NEUTROABS 12.1*  --   --   --   HGB 11.1* 9.9* 10.1* 9.7*  HCT 35.2* 32.1* 32.8* 31.3*  MCV 100.0 101.3* 101.2* 101.0*  PLT 369 291 295 264    Cardiac Enzymes:  Recent Labs Lab 06/23/16 0705  TROPONINI 0.13*    BNP: BNP (last 3 results)  Recent Labs  04/13/16 1046 06/12/16 0556 06/23/16 0705  BNP 107.2* 637.8* 1,426.3*    ProBNP (last 3 results)  Recent Labs  07/22/15 1536  PROBNP 61.0     CBG: No results for input(s): GLUCAP in the last 168 hours.  Coagulation Studies: No results for input(s): LABPROT, INR in the last 72 hours.  Other results: EKG: NSR 88 bpm RBBB with AS q waves  Imaging: No results found.   Medications:     Current Medications: . aspirin EC  81 mg Oral Daily  . clopidogrel  75 mg Oral Daily  . doxycycline  100 mg Oral Q12H  . enoxaparin (LOVENOX) injection  30 mg Subcutaneous Q24H  . furosemide  80 mg Intravenous TID  . isosorbide mononitrate  30 mg Oral Daily  . metoprolol succinate  75 mg Oral Daily  . multivitamin with minerals  1 tablet Oral Daily  .  pantoprazole  40 mg Oral BID  . simvastatin  20 mg Oral QHS  . sodium chloride flush  3 mL Intravenous Q12H    Infusions: . milrinone       Assessment   1. Acute on chronic combined CHF, ICM. EF ~40-45% with mid to distal anterior, anteroseptal and apical HK 2. HCAP 3. CAD with previous silent LAD infarct 4. Aortic Stenosis, Mild 5. Alcoholic Cirrhosis 6. AKI on CKD stage 3 7. RBBB   Plan   Pt remains markedly volume overloaded on exam, but now with worsening Creatinine.  Will add milrinone to augment diuresis and improve kidney perfusion. Pt hesitant to be placed on, but agrees to "low dose".  Will start at 0.125 mcg/kg/min. Supp electrolytes prn with goal K > 3.8 and goal Mg > 1.8.  Echo 06/15/16 LVEF 45-50% with Grade 1 DD. Mild AS + AI. EF decreased from 60-65% in June. Likely ischemic with recent NSTEMI, although patient refused catheterization.  With age, co-morbidities, and recent refusal of LHC. Will need to discuss [place] while in hospital.  From discussions so far he is hesitant to undergo any aggressive treatments.  He did however agreed to trial inotropic support in attempt to augment diuresis.   BCx and UCx NGTD.  Remains on Doxycycline per primary team.   Length of Stay: 3  [name] PA-C 06/26/2016, 10:37 AM  Advanced Heart Failure Team Pager [phone] (M-F; 7a - 4p)  Please contact [facility] for night-coverage after hours (4p -7a ) and weekends on [email or web address]  Patient seen and examined with Oda Kilts, PA-C. We discussed  all aspects of the encounter. I agree with the assessment and plan as stated above.   He remains markedly volume overloaded with poor response to IV lasix and worsening renal function. Suspect low output physiology. Agree with milrinone. Supp electrolytes. He is demanding to leave the hospital in a few days. Explained severity of his situation and importance to let us try and treat him adequately. May need Palliative Care to see  for [place] if not improving soon. CAD is stable without evidence of ongoing ischemia.   Bensimhon, Daniel,MD 7:37 PM

## 2016-06-27 ENCOUNTER — Inpatient Hospital Stay (HOSPITAL_COMMUNITY): Payer: Medicare Other

## 2016-06-27 DIAGNOSIS — J9601 Acute respiratory failure with hypoxia: Principal | ICD-10-CM

## 2016-06-27 LAB — BASIC METABOLIC PANEL
ANION GAP: 11 (ref 5–15)
BUN: 57 mg/dL — AB (ref 6–20)
CO2: 21 mmol/L — ABNORMAL LOW (ref 22–32)
Calcium: 8.6 mg/dL — ABNORMAL LOW (ref 8.9–10.3)
Chloride: 109 mmol/L (ref 101–111)
Creatinine, Ser: 2.84 mg/dL — ABNORMAL HIGH (ref 0.61–1.24)
GFR, EST AFRICAN AMERICAN: 22 mL/min — AB (ref >60)
GFR, EST NON AFRICAN AMERICAN: 19 mL/min — AB (ref >60)
Glucose, Bld: 132 mg/dL — ABNORMAL HIGH (ref 65–99)
POTASSIUM: 3.8 mmol/L (ref 3.5–5.1)
SODIUM: 141 mmol/L (ref 135–145)

## 2016-06-27 LAB — CBC
HCT: 31.1 % — ABNORMAL LOW (ref 39.0–52.0)
Hemoglobin: 9.6 g/dL — ABNORMAL LOW (ref 13.0–17.0)
MCH: 30.7 pg (ref 26.0–34.0)
MCHC: 30.9 g/dL (ref 30.0–36.0)
MCV: 99.4 fL (ref 78.0–100.0)
PLATELETS: 245 10*3/uL (ref 150–400)
RBC: 3.13 MIL/uL — AB (ref 4.22–5.81)
RDW: 14.7 % (ref 11.5–15.5)
WBC: 11.1 10*3/uL — AB (ref 4.0–10.5)

## 2016-06-27 LAB — PROCALCITONIN

## 2016-06-27 LAB — MAGNESIUM: MAGNESIUM: 1.8 mg/dL (ref 1.7–2.4)

## 2016-06-27 MED ORDER — POTASSIUM CHLORIDE CRYS ER 20 MEQ PO TBCR
40.0000 meq | EXTENDED_RELEASE_TABLET | Freq: Once | ORAL | Status: AC
  Administered 2016-06-27: 40 meq via ORAL
  Filled 2016-06-27: qty 2

## 2016-06-27 MED ORDER — METOLAZONE 2.5 MG PO TABS
2.5000 mg | ORAL_TABLET | Freq: Once | ORAL | Status: AC
  Administered 2016-06-27: 2.5 mg via ORAL
  Filled 2016-06-27: qty 1

## 2016-06-27 NOTE — Progress Notes (Signed)
Patient ID: [name], male   DOB: 12-08-1928, 82 y.o.   MRN: [id number]    Advanced Heart Failure Team Progress Note  Referring Physician: Dr. Murvin Natal Primary Physician: [name], MD Primary Cardiologist:  Dr. Angelena Form  Reason for Consultation: A/C combined CHF  HPI:    Feels the same.  Diuresis not marked yesterday but better than the prior day.  On milrinone now, creatinine stable at 2.8.   Scheduled Meds: . aspirin EC  81 mg Oral Daily  . clopidogrel  75 mg Oral Daily  . doxycycline  100 mg Oral Q12H  . enoxaparin (LOVENOX) injection  30 mg Subcutaneous Q24H  . furosemide  80 mg Intravenous TID  . isosorbide mononitrate  30 mg Oral Daily  . metoprolol succinate  75 mg Oral Daily  . multivitamin with minerals  1 tablet Oral Daily  . pantoprazole  40 mg Oral BID  . simvastatin  20 mg Oral QHS  . sodium chloride flush  3 mL Intravenous Q12H   Continuous Infusions: . milrinone 0.125 mcg/kg/min (06/27/16 0329)   PRN Meds:.sodium chloride, acetaminophen, ALPRAZolam, ondansetron (ZOFRAN) IV, sodium chloride flush, zolpidem    Past Medical History: Past Medical History:  Diagnosis Date  . Acute combined systolic and diastolic CHF, NYHA class 1 ([place])   . Acute respiratory failure ([place])   . Alcoholic cirrhosis ([place])   . 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 Alcoholism ([place])   . Aortic stenosis   . Ascites 11/2004   "early" SBP on paracentesis.   . Chronic gastritis 05/01/2016  . CKD (chronic kidney disease), stage III 10/11/2015  . Colon polyp perhaps in 1990s   pathology/type not known  . Diverticulosis of colon perhaps in 1990s  . Fracture of wrist 2014 spring vs early summer   refused orthopods suggestion to set the bone.   Marland Kitchen GERD (gastroesophageal reflux disease)   . HLD (hyperlipidemia)   . HTN (hypertension)   . Hypertrophic cardiomyopathy ([place])   . Portal hypertensive gastropathy 2006  . Renal insufficiency   . Rhabdomyolysis 01/2005    Past  Surgical History: Past Surgical History:  Procedure Laterality Date  . BASAL CELL CARCINOMA EXCISION Left 11/2012   lower eyelid  . ESOPHAGOGASTRODUODENOSCOPY N/A 08/04/2013   Procedure: ESOPHAGOGASTRODUODENOSCOPY (EGD);  Surgeon: Ladene Artist, MD;  Location: [facility] ENDOSCOPY;  Service: Endoscopy;  Laterality: N/A;  . ESOPHAGOGASTRODUODENOSCOPY (EGD) WITH PROPOFOL N/A 05/01/2016   Procedure: ESOPHAGOGASTRODUODENOSCOPY (EGD) WITH PROPOFOL;  Surgeon: Jerene Bears, MD;  Location: [facility] ENDOSCOPY;  Service: Endoscopy;  Laterality: N/A;  . INGUINAL HERNIA REPAIR    . MELANOMA EXCISION Left 02/2011   malignant melanoma of thigh    Family History: Family History  Problem Relation Age of Onset  . Cancer Father 22    lung cancer  . Heart disease Mother   . Heart disease Sister     Social History: Social History   Social History  . Marital status: Married    Spouse name: N/A  . Number of children: 4  . Years of education: 24   Occupational History  . pathologist Retired    retired   Social History Main Topics  . Smoking status: Never Smoker  . Smokeless tobacco: Never Used  . Alcohol use No     Comment: member of AA  . Drug use: No  . Sexual activity: Not Asked   Other Topics Concern  . 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  Remains independent. ACP- not discussed, will approach at next OV    Allergies:  No Known Allergies  Objective:    Vital Signs:   Temp:  [97.6 F (36.4 C)-98 F (36.7 C)] 98 F (36.7 C) (08/19 0553) Pulse Rate:  [80-93] 91 (08/19 0553) Resp:  [18-20] 20 (08/19 0553) BP: (115-134)/(52-61) 115/52 (08/19 0553) SpO2:  [93 %-97 %] 94 % (08/19 0553) Weight:  [167 lb 14.4 oz (76.2 kg)] 167 lb 14.4 oz (76.2 kg) (08/19 0553) Last BM Date: 06/25/16  Weight change: Filed Weights   06/25/16 0453 06/26/16 0550 06/27/16 0553  Weight: 172 lb 8 oz (78.2 kg) 172 lb 1.6 oz (78.1 kg) 167 lb 14.4 oz (76.2 kg)     Intake/Output:   Intake/Output Summary (Last 24 hours) at 06/27/16 1151 Last data filed at 06/27/16 0951  Gross per 24 hour  Intake          1013.02 ml  Output             1976 ml  Net          -962.98 ml     Physical Exam: General:  Elderly appearing.  HEENT: normal Neck: supple. JVP elevated to ear. Carotids 2+ bilat; no bruits. No thyromegaly or nodule noted.  Cor: PMI nondisplaced. RRR. No rubs, gallops. 2/6 AS murmur. Lungs: Crackles throughout, marked in bases. Diminished R>L  Abdomen: soft, NT, ND, no HSM. No bruits or masses. +BS  Extremities: no cyanosis, clubbing, rash. 3+ edema nearly to flanks. UNNA boots in place.  Neuro: alert & orientedx3, cranial nerves grossly intact. moves all 4 extremities w/o difficulty. Affect pleasant GU: Markedly edematous scrotum  Telemetry: Reviewed, NSR  Labs: Basic Metabolic Panel:  Recent Labs Lab 06/23/16 0705 06/23/16 1242 06/24/16 0538 06/25/16 0418 06/26/16 0224 06/27/16 0300  NA 138  --  140 140 141 141  K 4.7  --  4.3 3.9 3.8 3.8  CL 109  --  111 108 109 109  CO2 18*  --  20* 22 22 21*  GLUCOSE 135*  --  145* 136* 135* 132*  BUN 40*  --  41* 45* 50* 57*  CREATININE 1.84*  --  1.98* 2.35* 2.82* 2.84*  CALCIUM 9.0  --  8.7* 8.7* 8.6* 8.6*  MG  --  1.8  --   --   --  1.8    Liver Function Tests: No results for input(s): AST, ALT, ALKPHOS, BILITOT, PROT, ALBUMIN in the last 168 hours. No results for input(s): LIPASE, AMYLASE in the last 168 hours. No results for input(s): AMMONIA in the last 168 hours.  CBC:  Recent Labs Lab 06/23/16 0705 06/24/16 1406 06/25/16 0418 06/26/16 0224 06/27/16 0300  WBC 16.2* 13.8* 13.7* 11.5* 11.1*  NEUTROABS 12.1*  --   --   --   --   HGB 11.1* 9.9* 10.1* 9.7* 9.6*  HCT 35.2* 32.1* 32.8* 31.3* 31.1*  MCV 100.0 101.3* 101.2* 101.0* 99.4  PLT 369 291 295 264 245    Cardiac Enzymes:  Recent Labs Lab 06/23/16 0705  TROPONINI 0.13*    BNP: BNP (last 3  results)  Recent Labs  04/13/16 1046 06/12/16 0556 06/23/16 0705  BNP 107.2* 637.8* 1,426.3*    ProBNP (last 3 results)  Recent Labs  07/22/15 1536  PROBNP 61.0     CBG: No results for input(s): GLUCAP in the last 168 hours.  Coagulation Studies: No results for input(s): LABPROT, INR in the  last 72 hours.  Other results: EKG: NSR 88 bpm RBBB with AS q waves  Imaging: Dg Chest Port 1 View  Result Date: 06/27/2016 CLINICAL DATA:  HCAP (healthcare associated pna) CHF, CHF exacerbation. No complaints per pt this am as pt was just waking up and said he couldn't tell how he felt just yet. EXAM: PORTABLE CHEST 1 VIEW COMPARISON:  06/24/2016 FINDINGS: Patchy bilateral airspace lung opacities are without substantial change from the prior exam allowing for differences in patient positioning. Elevation right hemidiaphragm is stable. There are probable small pleural effusions. No pneumothorax. Cardiac silhouette is partly obscured but grossly normal in size. IMPRESSION: 1. No significant change in the bilateral lower lung zone patchy airspace opacities. This may reflect multifocal pneumonia or asymmetric pulmonary edema. Electronically Signed   By: Lajean Manes M.D.   On: 06/27/2016 07:31     Medications:     Current Medications: . aspirin EC  81 mg Oral Daily  . clopidogrel  75 mg Oral Daily  . doxycycline  100 mg Oral Q12H  . enoxaparin (LOVENOX) injection  30 mg Subcutaneous Q24H  . furosemide  80 mg Intravenous TID  . isosorbide mononitrate  30 mg Oral Daily  . metoprolol succinate  75 mg Oral Daily  . multivitamin with minerals  1 tablet Oral Daily  . pantoprazole  40 mg Oral BID  . simvastatin  20 mg Oral QHS  . sodium chloride flush  3 mL Intravenous Q12H    Infusions: . milrinone 0.125 mcg/kg/min (06/27/16 0329)     Assessment   1. Acute on chronic combined CHF, ICM. EF ~40-45% with mid to distal anterior, anteroseptal and apical HK 2. HCAP 3. CAD with  previous silent LAD infarct 4. Aortic Stenosis, Mild 5. Alcoholic Cirrhosis 6. AKI on CKD stage 3 7. RBBB   Plan   He remains markedly volume overloaded with poor response to IV lasix and worsening renal function. Suspect low output physiology. He has agreed to milrinone 0.125 mcg/kg/min, begun yesterday.  Creatinine stable compared to yesterday at 2.8, somewhat increased UOP.  - Will add metolazone 2.5 x 1 today.   - Follow creatinine closely.   Echo 06/15/16 LVEF 45-50% with Grade 1 DD. Mild AS + AI. EF decreased from 60-65% in June. Likely ischemic with recent NSTEMI, although patient refused catheterization.  With age, co-morbidities, and recent refusal of LHC, will need to discuss [place] while in hospital.  From discussions so far he is hesitant to undergo any aggressive treatments.  He did however agreed to trial inotropic support in attempt to augment diuresis.   BCx and UCx NGTD.  Remains on Doxycycline per primary team.   Length of Stay: [place]  06/27/2016, 11:51 AM  Advanced Heart Failure Team Pager [phone] (M-F; 7a - 4p)  Please contact [facility] for night-coverage after hours (4p -7a ) and weekends on [email or web address]

## 2016-06-27 NOTE — Progress Notes (Signed)
PROGRESS NOTE    [name]  [id number]  DOB: [birth year]  DOA: 06/23/2016 PCP: [name], MD Outpatient Specialists:  Hospital course: [name] is a 82 y.o. male with a Past Medical History of alcoholic cirrhosis, CKD, diverticulosis, GERD, HLD, HTN, cardiomyopathy, renal insufficiency, portal HTN who presents with acute respiratory failure secondary to HCAP, and CHF decompensation. On admission:  O2 sats 89% on RA and tachypneic.    Assessment & Plan:     Acute respiratory failure with hypoxia:  A) Hypertrophic obstructive cardiomyopathy /  Acute on chronic combined systolic and diastolic CHF, NYHA class 1  B) HCAP (healthcare-associated pneumonia) -Patient presents with acute onset of shortness of breath and associated elevated BNP and worsening lower extremity edema concerning for heart failure exacerbation; patient poor historian given dementia but son relates that last week staff noticed patient with dyspnea as well -He also has x-ray findings that could be consistent with a pneumonia process and this is associated with leukocytosis  -Lasix 80 mg IV every 8 hours ordered by HF team.  -Weight has increased from 145 pounds in June to 165 pounds in July and admission weight is up to 177 pounds -Continue preadmission Imdur, Toprol -No ACE I/ARB secondary to chronic kidney disease -Echocardiogram completed last admission: Echocardiogram completed last admission: EF 45-50% with hypokinesis of the anteroseptal and apical myocardium, grade 1 diastolic dysfunction, very mild aortic stenosis  -Daily weights/intake and output, consulted Heart Failure Team.  Pt started on IV milrinone 8/18.  Pt hesitant about starting it but eventually agreed.  -Broad-spectrum empiric antibiotics with vancomycin and Zosyn with pharmacy dosing, de-escalated 8/17 to doxycycline.  Repeat CXR in AM.  -Follow up on blood cultures and sputum culture -Continue supportive care with oxygen -Early  mobilization with physical therapy but patient is very deconditioned and gets very SOB with ambulation.      HYPERTENSION, Essential -Imdur and Toprol as above, norvasc discontinued.    Chronic Bilateral lower extremity edema -Has skin changes concerning for possible cellulitis -Antibiotics as above -Possible component of heart failure contributing -UNNA boot in place prior to admission-consult WOC RN   CKD (chronic kidney disease) stage 3, GFR 30-59 ml/min -Follow electrolytes closely with diuresis -Bump in creatinine noted    Elevated troponin -Mild elevation in troponin at 0.13 which actually demonstrates a downward trend from previous admission peak of 0.81 -Patient currently denying chest pain -Evaluated by cardiology last admission and underwent nuclear medicine Myoview scan. Cardiology documented that the patient was not known to have CAD but has not had ischemic evaluation prior to last admission. The nuclear stress test was consistent with possible old infarction but no acute ischemia. EF on stress test was 38% and EF on echo was 45-50%. Patient refused cardiac catheterization during previous admission. Cardiology opted to manage his non-STEMI with aspirin, beta blocker and Plavix and we have continued at time of admission today    Hyperlipidemia -Continue statin    Alcoholic cirrhosis  -Apparently no longer consuming alcoholic beverages and during previous admission LFTs as well as total bilirubin were normal, but very concerned about his profound fluid weight gain since recent hospitalization, I don't believe that he can independently care for himself any longer.  I think he needs SNF but patient is very against it at this time.      Aortic stenosis, mild -Asymptomatic   Dementia -Does not carry a formal diagnosis of dementia and uncertain if memory loss more related to prior alcoholism -Also  was not on disease modifying medications -Patient's wife died several  weeks ago and son who has assumed the POA roll since patient's wife was POA    Iron deficiency anemia due to chronic blood loss -Hemoglobin stable and around baseline between 10.3 and 11.1   DVT prophylaxis: Lovenox  Code Status: DO NOT RESUSCITATE  Family Communication: Son, [name] at bedside  Disposition Plan: TBD Consults called: None  Antimicrobials: Anti-infectives    Start     Dose/Rate Route Frequency Ordered Stop   06/25/16 1400  doxycycline (VIBRA-TABS) tablet 100 mg     100 mg Oral Every 12 hours 06/25/16 1300     06/24/16 1000  vancomycin (VANCOCIN) IVPB 750 mg/150 ml premix  Status:  Discontinued     750 mg 150 mL/hr over 60 Minutes Intravenous Every 24 hours 06/23/16 0903 06/25/16 1300   06/23/16 1500  piperacillin-tazobactam (ZOSYN) IVPB 2.25 g  Status:  Discontinued     2.25 g 100 mL/hr over 30 Minutes Intravenous Every 6 hours 06/23/16 0934 06/23/16 0959   06/23/16 1500  piperacillin-tazobactam (ZOSYN) IVPB 3.375 g  Status:  Discontinued     3.375 g 12.5 mL/hr over 240 Minutes Intravenous Every 6 hours 06/23/16 1000 06/23/16 1006   06/23/16 1500  piperacillin-tazobactam (ZOSYN) IVPB 3.375 g  Status:  Discontinued     3.375 g 12.5 mL/hr over 240 Minutes Intravenous Every 8 hours 06/23/16 1006 06/25/16 1300   06/23/16 0845  piperacillin-tazobactam (ZOSYN) IVPB 3.375 g     3.375 g 100 mL/hr over 30 Minutes Intravenous  Once 06/23/16 0832 06/23/16 0930   06/23/16 0845  vancomycin (VANCOCIN) 1,500 mg in sodium chloride 0.9 % 500 mL IVPB     1,500 mg 250 mL/hr over 120 Minutes Intravenous  Once 06/23/16 0838 06/23/16 1130     Subjective: Pt says that he is breathing better today.    Objective: Vitals:   06/26/16 1400 06/26/16 1623 06/26/16 2026 06/27/16 0553  BP: (!) 132/56 131/61 (!) 134/57 (!) 115/52  Pulse: 81 80 93 91  Resp:  18 20 20   Temp:  97.8 F (36.6 C) 97.6 F (36.4 C) 98 F (36.7 C)  TempSrc:  Oral Oral Oral  SpO2:  97% 93% 94%  Weight:     76.2 kg (167 lb 14.4 oz)  Height:        Intake/Output Summary (Last 24 hours) at 06/27/16 1014 Last data filed at 06/27/16 0951  Gross per 24 hour  Intake          1013.02 ml  Output             1976 ml  Net          -962.98 ml   Filed Weights   06/25/16 0453 06/26/16 0550 06/27/16 0553  Weight: 78.2 kg (172 lb 8 oz) 78.1 kg (172 lb 1.6 oz) 76.2 kg (167 lb 14.4 oz)   Exam:  General exam: awake, alert, sitting up in chair, no apparent distress.  Respiratory system: bibasilar crackles.  Cardiovascular system: S1 & S2 heard. No JVD, murmurs, gallops, clicks or pedal edema. Gastrointestinal system: Abdomen is nondistended, soft and nontender. Normal bowel sounds heard. Central nervous system: Alert and oriented. No focal neurological deficits. Extremities: 2+ edema bilateral LEs. Severe scrotal edema slightly improved.   Data Reviewed: Basic Metabolic Panel:  Recent Labs Lab 06/23/16 0705 06/23/16 1242 06/24/16 0538 06/25/16 0418 06/26/16 0224 06/27/16 0300  NA 138  --  140 140 141 141  K 4.7  --  4.3 3.9 3.8 3.8  CL 109  --  111 108 109 109  CO2 18*  --  20* 22 22 21*  GLUCOSE 135*  --  145* 136* 135* 132*  BUN 40*  --  41* 45* 50* 57*  CREATININE 1.84*  --  1.98* 2.35* 2.82* 2.84*  CALCIUM 9.0  --  8.7* 8.7* 8.6* 8.6*  MG  --  1.8  --   --   --  1.8   Liver Function Tests: No results for input(s): AST, ALT, ALKPHOS, BILITOT, PROT, ALBUMIN in the last 168 hours. No results for input(s): LIPASE, AMYLASE in the last 168 hours. No results for input(s): AMMONIA in the last 168 hours. CBC:  Recent Labs Lab 06/23/16 0705 06/24/16 1406 06/25/16 0418 06/26/16 0224 06/27/16 0300  WBC 16.2* 13.8* 13.7* 11.5* 11.1*  NEUTROABS 12.1*  --   --   --   --   HGB 11.1* 9.9* 10.1* 9.7* 9.6*  HCT 35.2* 32.1* 32.8* 31.3* 31.1*  MCV 100.0 101.3* 101.2* 101.0* 99.4  PLT 369 291 295 264 245   Cardiac Enzymes:  Recent Labs Lab 06/23/16 0705  TROPONINI 0.13*   CBG (last  3)  No results for input(s): GLUCAP in the last 72 hours. Recent Results (from the past 240 hour(s))  Blood Culture (routine x 2)     Status: None (Preliminary result)   Collection Time: 06/23/16  8:28 AM  Result Value Ref Range Status   Specimen Description BLOOD RIGHT ANTECUBITAL  Final   Special Requests BOTTLES DRAWN AEROBIC AND ANAEROBIC  5CC  Final   Culture NO GROWTH 3 DAYS  Final   Report Status PENDING  Incomplete  Urine culture     Status: None   Collection Time: 06/23/16  8:31 AM  Result Value Ref Range Status   Specimen Description URINE, RANDOM  Final   Special Requests NONE  Final   Culture NO GROWTH  Final   Report Status 06/24/2016 FINAL  Final  Blood Culture (routine x 2)     Status: None (Preliminary result)   Collection Time: 06/23/16  8:35 AM  Result Value Ref Range Status   Specimen Description BLOOD RIGHT ANTECUBITAL  Final   Special Requests BOTTLES DRAWN AEROBIC ONLY  5CC  Final   Culture NO GROWTH 3 DAYS  Final   Report Status PENDING  Incomplete     Studies: Dg Chest Port 1 View  Result Date: 06/27/2016 CLINICAL DATA:  HCAP (healthcare associated pna) CHF, CHF exacerbation. No complaints per pt this am as pt was just waking up and said he couldn't tell how he felt just yet. EXAM: PORTABLE CHEST 1 VIEW COMPARISON:  06/24/2016 FINDINGS: Patchy bilateral airspace lung opacities are without substantial change from the prior exam allowing for differences in patient positioning. Elevation right hemidiaphragm is stable. There are probable small pleural effusions. No pneumothorax. Cardiac silhouette is partly obscured but grossly normal in size. IMPRESSION: 1. No significant change in the bilateral lower lung zone patchy airspace opacities. This may reflect multifocal pneumonia or asymmetric pulmonary edema. Electronically Signed   By: Lajean Manes M.D.   On: 06/27/2016 07:31    Scheduled Meds: . aspirin EC  81 mg Oral Daily  . clopidogrel  75 mg Oral Daily  .  doxycycline  100 mg Oral Q12H  . enoxaparin (LOVENOX) injection  30 mg Subcutaneous Q24H  . furosemide  80 mg Intravenous TID  . isosorbide mononitrate  30  mg Oral Daily  . metoprolol succinate  75 mg Oral Daily  . multivitamin with minerals  1 tablet Oral Daily  . pantoprazole  40 mg Oral BID  . simvastatin  20 mg Oral QHS  . sodium chloride flush  3 mL Intravenous Q12H   Continuous Infusions: . milrinone 0.125 mcg/kg/min (06/27/16 0329)    Principal Problem:   Acute respiratory failure with hypoxia (HCC) Active Problems:   Elevated troponin   Hyperlipidemia   HYPERTENSION, BENIGN   Hypertrophic obstructive cardiomyopathy (HCC)   Bilateral lower extremity edema   Alcoholic cirrhosis (HCC)   Aortic stenosis, mild   Memory loss   CKD (chronic kidney disease) stage 3, GFR 30-59 ml/min   Iron deficiency anemia due to chronic blood loss   Acute combined systolic and diastolic heart failure ([place])   HCAP (healthcare-associated pneumonia)   AKI (acute kidney injury) ([place])   Time spent:   Irwin Brakeman, MD, FAAFP [facility] Pager [phone]  If 7PM-7AM, please contact night-coverage [email or web address] Password [email or web address] 06/27/2016, 10:14 AM    LOS: 4 days

## 2016-06-27 NOTE — Clinical Social Work Note (Signed)
Clinical Social Worker received notification for new SNF placement.  Patient is a current resident at [facility] and would like to return at discharge.  Patient is open and willing to home health, however adamant that he will not go to SNF, even at [facility].  CSW contacted RNCM to relay information regarding patient plans at discharge.  FL2 completed in the event that plans change last minute.  Clinical Social Worker will sign off for now as social work intervention is no longer needed. Please consult [place] again if new need arises.  Barbette Or, [place]

## 2016-06-27 NOTE — NC FL2 (Signed)
[place] LEVEL OF CARE SCREENING TOOL     IDENTIFICATION  Patient Name: [name] Birthdate: June 24, 1929 Sex: male Admission Date (Current Location): 06/23/2016  [facility] and [place] Number:  Herbalist and Address:  [facility], [place] [address], [place], [place] [address]      Provider Number: [id number]  Attending Physician Name and Address:  Murlean Iba, MD  Relative Name and Phone Number:       Current Level of Care: Hospital Recommended Level of Care: [place] Prior Approval Number:    Date Approved/Denied:   PASRR Number:    Discharge Plan: SNF    Current Diagnoses: Patient Active Problem List   Diagnosis Date Noted  . AKI (acute kidney injury) ([place])   . HCAP (healthcare-associated pneumonia) 06/23/2016  . Acute respiratory failure with hypoxia ([place]) 06/23/2016  . Chest pain 06/13/2016  . Acute combined systolic and diastolic CHF, NYHA class 1 ([place])   . RBBB   . Abnormal lung sounds 05/28/2016  . Gastritis 05/02/2016  . Elevated troponin 05/02/2016  . Demand ischemia of myocardium ([place]) 05/02/2016  . Acute blood loss anemia 05/02/2016  . Pain in the chest 05/02/2016  . Duodenal ulcer disease   . Melena   . Iron deficiency anemia due to chronic blood loss   . GI bleed 04/13/2016  . CKD (chronic kidney disease) stage 3, GFR 30-59 ml/min 10/11/2015  . Memory loss 10/04/2015  . Hyperglycemia 02/23/2015  . Gross hematuria 08/23/2014  . Aortic stenosis, mild 09/13/2013  . Acute duodenal ulcer with hemorrhage, without mention of obstruction 08/04/2013  . Malignant melanoma of lower extremity or hip, left 03/17/2012  . Routine health maintenance 03/17/2012  . Alcoholic cirrhosis ([place]) 123456  . Portal hypertensive gastropathy 05/26/2011  . Bilateral lower extremity edema 10/10/2010  . Alcohol abuse 09/05/2009  . Hyperlipidemia 06/23/2009  . HYPERTENSION, BENIGN 06/23/2009  .  Hypertrophic obstructive cardiomyopathy ([place]) 06/23/2009    Orientation RESPIRATION BLADDER Height & Weight     Self, Time, Situation, Place  O2 (4L) Continent Weight: 167 lb 14.4 oz (76.2 kg) (b scale) Height:  5\' 8"  (172.7 cm)  BEHAVIORAL SYMPTOMS/MOOD NEUROLOGICAL BOWEL NUTRITION STATUS      Continent Diet (Heart Healthy / Thin Liquid)  AMBULATORY STATUS COMMUNICATION OF NEEDS Skin   Limited Assist Verbally Other (Comment) ([place] on legs)                       Personal Care Assistance Level of Assistance  Bathing, Feeding, Dressing Bathing Assistance: Limited assistance Feeding assistance: Independent Dressing Assistance: Limited assistance     Functional Limitations Info  Hearing, Speech, Sight Sight Info: Impaired Hearing Info: Impaired Speech Info: Adequate    SPECIAL CARE FACTORS FREQUENCY  PT (By licensed PT), OT (By licensed OT)     PT Frequency: 3 OT Frequency: 3            Contractures Contractures Info: Not present    Additional Factors Info  Code Status, Allergies Code Status Info: DNR Allergies Info: No Known Allergies           Current Medications (06/27/2016):  This is the current hospital active medication list Current Facility-Administered Medications  Medication Dose Route Frequency Provider Last Rate Last Dose  . 0.9 %  sodium chloride infusion  250 mL Intravenous PRN Samella Parr, NP      . acetaminophen (TYLENOL) tablet 650 mg  650 mg Oral Q4H PRN Samella Parr, NP      . ALPRAZolam Duanne Moron) tablet 0.25 mg  0.25 mg Oral QHS PRN Samella Parr, NP      . aspirin EC tablet 81 mg  81 mg Oral Daily Samella Parr, NP   81 mg at 06/27/16 0929  . clopidogrel (PLAVIX) tablet 75 mg  75 mg Oral Daily Samella Parr, NP   75 mg at 06/27/16 0928  . doxycycline (VIBRA-TABS) tablet 100 mg  100 mg Oral Q12H [place] Marisa Hua, MD   100 mg at 06/27/16 1013  . enoxaparin (LOVENOX) injection 30 mg  30 mg Subcutaneous Q24H Clanford L  Johnson, MD   30 mg at 06/26/16 2020  . furosemide (LASIX) injection 80 mg  80 mg Intravenous TID Satira Mccallum Tillery, PA-C   80 mg at 06/27/16 0929  . isosorbide mononitrate (IMDUR) 24 hr tablet 30 mg  30 mg Oral Daily Samella Parr, NP   30 mg at 06/27/16 0928  . metolazone (ZAROXOLYN) tablet 2.5 mg  2.5 mg Oral Once Larey Dresser, MD      . metoprolol succinate (TOPROL-XL) 24 hr tablet 75 mg  75 mg Oral Daily Samella Parr, NP   75 mg at 06/27/16 0928  . milrinone (PRIMACOR) 20 MG/100 ML (0.2 mg/mL) infusion  0.125 mcg/kg/min Intravenous Continuous [name], PA-C 2.9 mL/hr at 06/27/16 0329 0.125 mcg/kg/min at 06/27/16 0329  . multivitamin with minerals tablet 1 tablet  1 tablet Oral Daily Samella Parr, NP   1 tablet at 06/27/16 [phone]  . ondansetron (ZOFRAN) injection 4 mg  4 mg Intravenous Q6H PRN Samella Parr, NP      . pantoprazole (PROTONIX) EC tablet 40 mg  40 mg Oral BID Samella Parr, NP   40 mg at 06/27/16 0928  . potassium chloride SA (K-DUR,KLOR-CON) CR tablet 40 mEq  40 mEq Oral Once Larey Dresser, MD      . simvastatin (ZOCOR) tablet 20 mg  20 mg Oral QHS Samella Parr, NP   20 mg at 06/26/16 2129  . sodium chloride flush (NS) 0.9 % injection 3 mL  3 mL Intravenous Q12H Samella Parr, NP   3 mL at 06/27/16 0929  . sodium chloride flush (NS) 0.9 % injection 3 mL  3 mL Intravenous PRN Samella Parr, NP      . zolpidem (AMBIEN) tablet 5 mg  5 mg Oral QHS PRN Samella Parr, NP         Discharge Medications: Please see discharge summary for a list of discharge medications.  Relevant Imaging Results:  Relevant Lab Results:   Additional Information SSN [id number]    Barbette Or, [place]

## 2016-06-28 DIAGNOSIS — I8312 Varicose veins of left lower extremity with inflammation: Secondary | ICD-10-CM

## 2016-06-28 DIAGNOSIS — I872 Venous insufficiency (chronic) (peripheral): Secondary | ICD-10-CM

## 2016-06-28 DIAGNOSIS — I8311 Varicose veins of right lower extremity with inflammation: Secondary | ICD-10-CM

## 2016-06-28 LAB — BASIC METABOLIC PANEL
Anion gap: 10 (ref 5–15)
BUN: 61 mg/dL — ABNORMAL HIGH (ref 6–20)
CALCIUM: 8.8 mg/dL — AB (ref 8.9–10.3)
CO2: 25 mmol/L (ref 22–32)
CREATININE: 2.95 mg/dL — AB (ref 0.61–1.24)
Chloride: 104 mmol/L (ref 101–111)
GFR, EST AFRICAN AMERICAN: 21 mL/min — AB (ref >60)
GFR, EST NON AFRICAN AMERICAN: 18 mL/min — AB (ref >60)
Glucose, Bld: 124 mg/dL — ABNORMAL HIGH (ref 65–99)
Potassium: 3.8 mmol/L (ref 3.5–5.1)
SODIUM: 139 mmol/L (ref 135–145)

## 2016-06-28 LAB — CULTURE, BLOOD (ROUTINE X 2)
CULTURE: NO GROWTH
CULTURE: NO GROWTH

## 2016-06-28 LAB — CBC
HCT: 31.7 % — ABNORMAL LOW (ref 39.0–52.0)
Hemoglobin: 9.7 g/dL — ABNORMAL LOW (ref 13.0–17.0)
MCH: 30.3 pg (ref 26.0–34.0)
MCHC: 30.6 g/dL (ref 30.0–36.0)
MCV: 99.1 fL (ref 78.0–100.0)
PLATELETS: 239 10*3/uL (ref 150–400)
RBC: 3.2 MIL/uL — AB (ref 4.22–5.81)
RDW: 14.5 % (ref 11.5–15.5)
WBC: 10 10*3/uL (ref 4.0–10.5)

## 2016-06-28 MED ORDER — FUROSEMIDE 10 MG/ML IJ SOLN
80.0000 mg | Freq: Two times a day (BID) | INTRAMUSCULAR | Status: DC
  Administered 2016-06-28 – 2016-06-30 (×6): 80 mg via INTRAVENOUS
  Filled 2016-06-28 (×6): qty 8

## 2016-06-28 NOTE — Progress Notes (Signed)
Patient requested Lasix dose be given at 1200 & 1800 today. MAR updated to reflect this change. No further needs at this time. A.Marlia Schewe, RN

## 2016-06-28 NOTE — Progress Notes (Signed)
Patient ID: [name], male   DOB: 10-22-1929, 82 y.o.   MRN: [id number]    Advanced Heart Failure Team Progress Note  Referring Physician: Dr. Murvin Natal Primary Physician: [name], MD Primary Cardiologist:  Dr. Angelena Form  Reason for Consultation: A/C combined CHF  HPI:    He remains on low-dose milrinone.  Got a dose of metolazone yesterday along with Lasix.  Weight down 4 lbs, I/Os probably not accurately recorded.  Creatinine up to 2.94.    Scheduled Meds: . aspirin EC  81 mg Oral Daily  . clopidogrel  75 mg Oral Daily  . doxycycline  100 mg Oral Q12H  . enoxaparin (LOVENOX) injection  30 mg Subcutaneous Q24H  . furosemide  80 mg Intravenous BID  . isosorbide mononitrate  30 mg Oral Daily  . metoprolol succinate  75 mg Oral Daily  . multivitamin with minerals  1 tablet Oral Daily  . pantoprazole  40 mg Oral BID  . simvastatin  20 mg Oral QHS  . sodium chloride flush  3 mL Intravenous Q12H   Continuous Infusions: . milrinone 0.125 mcg/kg/min (06/28/16 0646)   PRN Meds:.sodium chloride, acetaminophen, ALPRAZolam, ondansetron (ZOFRAN) IV, sodium chloride flush, zolpidem    Past Medical History: Past Medical History:  Diagnosis Date  . Acute combined systolic and diastolic CHF, NYHA class 1 ([place])   . Acute respiratory failure ([place])   . Alcoholic cirrhosis ([place])   . 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 Alcoholism ([place])   . Aortic stenosis   . Ascites 11/2004   "early" SBP on paracentesis.   . Chronic gastritis 05/01/2016  . CKD (chronic kidney disease), stage III 10/11/2015  . Colon polyp perhaps in 1990s   pathology/type not known  . Diverticulosis of colon perhaps in 1990s  . Fracture of wrist 2014 spring vs early summer   refused orthopods suggestion to set the bone.   Marland Kitchen GERD (gastroesophageal reflux disease)   . HLD (hyperlipidemia)   . HTN (hypertension)   . Hypertrophic cardiomyopathy ([place])   . Portal hypertensive gastropathy 2006  . Renal  insufficiency   . Rhabdomyolysis 01/2005    Past Surgical History: Past Surgical History:  Procedure Laterality Date  . BASAL CELL CARCINOMA EXCISION Left 11/2012   lower eyelid  . ESOPHAGOGASTRODUODENOSCOPY N/A 08/04/2013   Procedure: ESOPHAGOGASTRODUODENOSCOPY (EGD);  Surgeon: Ladene Artist, MD;  Location: [facility] ENDOSCOPY;  Service: Endoscopy;  Laterality: N/A;  . ESOPHAGOGASTRODUODENOSCOPY (EGD) WITH PROPOFOL N/A 05/01/2016   Procedure: ESOPHAGOGASTRODUODENOSCOPY (EGD) WITH PROPOFOL;  Surgeon: Jerene Bears, MD;  Location[facility] ENDOSCOPY;  Service: Endoscopy;  Laterality: N/A;  . INGUINAL HERNIA REPAIR    . MELANOMA EXCISION Left 02/2011   malignant melanoma of thigh    Family History: Family History  Problem Relation Age of Onset  . Cancer Father 88    lung cancer  . Heart disease Mother   . Heart disease Sister     Social History: Social History   Social History  . Marital status: Married    Spouse name: N/A  . Number of children: 4  . Years of education: 24   Occupational History  . pathologist Retired    retired   Social History Main Topics  . Smoking status: Never Smoker  . Smokeless tobacco: Never Used  . Alcohol use No     Comment: member of AA  . Drug use: No  . Sexual activity: Not Asked   Other Topics Concern  . None  Social History Narrative   Retired Industrial/product designer. Lives with his wife. 4 children.  Remains independent. ACP- not discussed, will approach at next OV    Allergies:  No Known Allergies  Objective:    Vital Signs:   Temp:  [97.7 F (36.5 C)-98.4 F (36.9 C)] 98.1 F (36.7 C) (08/20 0532) Pulse Rate:  [77-92] 92 (08/20 0532) Resp:  [18] 18 (08/20 0532) BP: (133-153)/(56-69) 142/64 (08/20 0532) SpO2:  [94 %-100 %] 94 % (08/20 0532) Weight:  [163 lb 4.8 oz (74.1 kg)] 163 lb 4.8 oz (74.1 kg) (08/20 0300) Last BM Date: 06/25/16  Weight change: Filed Weights   06/26/16 0550 06/27/16 0553 06/28/16 0300  Weight: 172 lb 1.6 oz (78.1 kg)  167 lb 14.4 oz (76.2 kg) 163 lb 4.8 oz (74.1 kg)    Intake/Output:   Intake/Output Summary (Last 24 hours) at 06/28/16 0903 Last data filed at 06/28/16 0842  Gross per 24 hour  Intake              758 ml  Output             1725 ml  Net             -967 ml     Physical Exam: General:  Elderly appearing.  HEENT: normal Neck: supple. JVP 12 cm. Carotids 2+ bilat; no bruits. No thyromegaly or nodule noted.  Cor: PMI nondisplaced. RRR. No rubs, gallops. 2/6 AS murmur. Lungs: Crackles throughout, marked in bases. Diminished R>L  Abdomen: soft, NT, ND, no HSM. No bruits or masses. +BS  Extremities: no cyanosis, clubbing, rash. 1+ edema to knees. UNNA boots in place.  Neuro: alert & orientedx3, cranial nerves grossly intact. moves all 4 extremities w/o difficulty. Affect pleasant  Telemetry: Reviewed, NSR  Labs: Basic Metabolic Panel:  Recent Labs Lab 06/23/16 1242 06/24/16 0538 06/25/16 0418 06/26/16 0224 06/27/16 0300 06/28/16 0325  NA  --  140 140 141 141 139  K  --  4.3 3.9 3.8 3.8 3.8  CL  --  111 108 109 109 104  CO2  --  20* 22 22 21* 25  GLUCOSE  --  145* 136* 135* 132* 124*  BUN  --  41* 45* 50* 57* 61*  CREATININE  --  1.98* 2.35* 2.82* 2.84* 2.95*  CALCIUM  --  8.7* 8.7* 8.6* 8.6* 8.8*  MG 1.8  --   --   --  1.8  --     Liver Function Tests: No results for input(s): AST, ALT, ALKPHOS, BILITOT, PROT, ALBUMIN in the last 168 hours. No results for input(s): LIPASE, AMYLASE in the last 168 hours. No results for input(s): AMMONIA in the last 168 hours.  CBC:  Recent Labs Lab 06/23/16 0705 06/24/16 1406 06/25/16 0418 06/26/16 0224 06/27/16 0300 06/28/16 0325  WBC 16.2* 13.8* 13.7* 11.5* 11.1* 10.0  NEUTROABS 12.1*  --   --   --   --   --   HGB 11.1* 9.9* 10.1* 9.7* 9.6* 9.7*  HCT 35.2* 32.1* 32.8* 31.3* 31.1* 31.7*  MCV 100.0 101.3* 101.2* 101.0* 99.4 99.1  PLT 369 291 295 264 245 239    Cardiac Enzymes:  Recent Labs Lab 06/23/16 0705    TROPONINI 0.13*    BNP: BNP (last 3 results)  Recent Labs  04/13/16 1046 06/12/16 0556 06/23/16 0705  BNP 107.2* 637.8* 1,426.3*    ProBNP (last 3 results)  Recent Labs  07/22/15 1536  PROBNP 61.0     CBG:  No results for input(s): GLUCAP in the last 168 hours.  Coagulation Studies: No results for input(s): LABPROT, INR in the last 72 hours.  Other results: EKG: NSR 88 bpm RBBB with AS q waves  Imaging: Dg Chest Port 1 View  Result Date: 06/27/2016 CLINICAL DATA:  HCAP (healthcare associated pna) CHF, CHF exacerbation. No complaints per pt this am as pt was just waking up and said he couldn't tell how he felt just yet. EXAM: PORTABLE CHEST 1 VIEW COMPARISON:  06/24/2016 FINDINGS: Patchy bilateral airspace lung opacities are without substantial change from the prior exam allowing for differences in patient positioning. Elevation right hemidiaphragm is stable. There are probable small pleural effusions. No pneumothorax. Cardiac silhouette is partly obscured but grossly normal in size. IMPRESSION: 1. No significant change in the bilateral lower lung zone patchy airspace opacities. This may reflect multifocal pneumonia or asymmetric pulmonary edema. Electronically Signed   By: Lajean Manes M.D.   On: 06/27/2016 07:31     Medications:     Current Medications: . aspirin EC  81 mg Oral Daily  . clopidogrel  75 mg Oral Daily  . doxycycline  100 mg Oral Q12H  . enoxaparin (LOVENOX) injection  30 mg Subcutaneous Q24H  . furosemide  80 mg Intravenous BID  . isosorbide mononitrate  30 mg Oral Daily  . metoprolol succinate  75 mg Oral Daily  . multivitamin with minerals  1 tablet Oral Daily  . pantoprazole  40 mg Oral BID  . simvastatin  20 mg Oral QHS  . sodium chloride flush  3 mL Intravenous Q12H    Infusions: . milrinone 0.125 mcg/kg/min (06/28/16 0646)     Assessment   1. Acute on chronic combined CHF, ICM. EF ~40-45% with mid to distal anterior, anteroseptal  and apical HK 2. HCAP 3. CAD with previous silent LAD infarct 4. Aortic Stenosis, Mild 5. Alcoholic Cirrhosis 6. AKI on CKD stage 3 7. RBBB   Plan   Suspect low output physiology. He is on milrinone 0.125 mcg/kg/min.  Weight down with metolazone dose yesterday but BUN/creatinine higher.  Still volume overloaded on exam.  - With rise in creatinine, no metolazone today and will decrease Lasix to bid.   - Continue milrinone today.  - Suspect we may not be able to take much more fluid off given renal function.  Echo 06/15/16 LVEF 45-50% with Grade 1 DD. Mild AS + AI. EF decreased from 60-65% in June. Likely ischemic with recent NSTEMI, although patient refused catheterization.  With age, co-morbidities, and recent refusal of LHC, will need to discuss [place] while in hospital.  From discussions so far he is hesitant to undergo any aggressive treatments.  He did however agreed to trial inotropic support in attempt to augment diuresis.   BCx and UCx NGTD.  Remains on Doxycycline per primary team.   Work with PT.   Length of Stay: [place]  06/28/2016, 9:03 AM  Advanced Heart Failure Team Pager [phone] (M-F; 7a - 4p)  Please contact [facility] for night-coverage after hours (4p -7a ) and weekends on [email or web address]

## 2016-06-28 NOTE — Progress Notes (Signed)
PROGRESS NOTE    [name]  [id number]  DOB: 1929/05/27  DOA: 06/23/2016 PCP: [name], MD Outpatient Specialists:  Hospital course: [name] is a 82 y.o. male with a Past Medical History of alcoholic cirrhosis, CKD, diverticulosis, GERD, HLD, HTN, cardiomyopathy, renal insufficiency, portal HTN who presents with acute respiratory failure secondary to HCAP, and CHF decompensation. On admission:  O2 sats 89% on RA and tachypneic.    Assessment & Plan:     Acute respiratory failure with hypoxia:  A) Hypertrophic obstructive cardiomyopathy /  Acute on chronic combined systolic and diastolic CHF, NYHA class 1  B) HCAP (healthcare-associated pneumonia) -Patient presents with acute onset of shortness of breath and associated elevated BNP and worsening lower extremity edema concerning for heart failure exacerbation; patient poor historian given dementia but son relates that last week staff noticed patient with dyspnea as well -He also has x-ray findings that could be consistent with a pneumonia process and this is associated with leukocytosis  -Lasix 80 mg IV BID ordered by HF team.  -Weight has increased from 145 pounds in June to 165 pounds in July and admission weight is up to 177 pounds, slowly trending down -Continue preadmission Imdur, Toprol -No ACE I/ARB secondary to chronic kidney disease -Echocardiogram completed last admission: Echocardiogram completed last admission: EF 45-50% with hypokinesis of the anteroseptal and apical myocardium, grade 1 diastolic dysfunction, very mild aortic stenosis  -Daily weights/intake and output, consulted Heart Failure Team.  Pt started on IV milrinone 8/18.  Pt hesitant about starting it but eventually agreed.  -Broad-spectrum empiric antibiotics with vancomycin and Zosyn with pharmacy dosing, de-escalated 8/17 to doxycycline.  Repeat CXR in AM.  -Follow up on blood cultures and sputum culture -Continue supportive care with  oxygen -Early mobilization with physical therapy but patient is very deconditioned and gets very SOB with ambulation.      HYPERTENSION, Essential -Imdur and Toprol as above, norvasc discontinued.    Chronic Bilateral lower extremity edema -Has skin changes concerning for possible cellulitis -Antibiotics as above -Possible component of heart failure contributing -UNNA boot in place prior to admission-consult WOC RN   CKD (chronic kidney disease) stage 3, GFR 30-59 ml/min -Follow electrolytes closely with diuresis -Bump in creatinine noted    Elevated troponin -Mild elevation in troponin at 0.13 which actually demonstrates a downward trend from previous admission peak of 0.81 -Patient currently denying chest pain -Evaluated by cardiology last admission and underwent nuclear medicine Myoview scan. Cardiology documented that the patient was not known to have CAD but has not had ischemic evaluation prior to last admission. The nuclear stress test was consistent with possible old infarction but no acute ischemia. EF on stress test was 38% and EF on echo was 45-50%. Patient refused cardiac catheterization during previous admission. Cardiology opted to manage his non-STEMI with aspirin, beta blocker and Plavix and we have continued at time of admission today    Hyperlipidemia -Continue statin    Alcoholic cirrhosis  -Apparently no longer consuming alcoholic beverages and during previous admission LFTs as well as total bilirubin were normal, but very concerned about his profound fluid weight gain since recent hospitalization, I don't believe that he can independently care for himself any longer.  I think he needs SNF but patient is very against it at this time.     Aortic stenosis, mild -Asymptomatic   Dementia -Does not carry a formal diagnosis of dementia and uncertain if memory loss more related to prior alcoholism -Also  was not on disease modifying medications -Patient's wife died  several weeks ago and son who has assumed the POA roll since patient's wife was POA    Iron deficiency anemia due to chronic blood loss -Hemoglobin stable and around baseline between 10.3 and 11.1   DVT prophylaxis: Lovenox  Code Status: DO NOT RESUSCITATE  Family Communication: Son, [name] Disposition Plan: TBD Consults called: None  Antimicrobials: Anti-infectives    Start     Dose/Rate Route Frequency Ordered Stop   06/25/16 1400  doxycycline (VIBRA-TABS) tablet 100 mg     100 mg Oral Every 12 hours 06/25/16 1300 07/01/16 0959   06/24/16 1000  vancomycin (VANCOCIN) IVPB 750 mg/150 ml premix  Status:  Discontinued     750 mg 150 mL/hr over 60 Minutes Intravenous Every 24 hours 06/23/16 0903 06/25/16 1300   06/23/16 1500  piperacillin-tazobactam (ZOSYN) IVPB 2.25 g  Status:  Discontinued     2.25 g 100 mL/hr over 30 Minutes Intravenous Every 6 hours 06/23/16 0934 06/23/16 0959   06/23/16 1500  piperacillin-tazobactam (ZOSYN) IVPB 3.375 g  Status:  Discontinued     3.375 g 12.5 mL/hr over 240 Minutes Intravenous Every 6 hours 06/23/16 1000 06/23/16 1006   06/23/16 1500  piperacillin-tazobactam (ZOSYN) IVPB 3.375 g  Status:  Discontinued     3.375 g 12.5 mL/hr over 240 Minutes Intravenous Every 8 hours 06/23/16 1006 06/25/16 1300   06/23/16 0845  piperacillin-tazobactam (ZOSYN) IVPB 3.375 g     3.375 g 100 mL/hr over 30 Minutes Intravenous  Once 06/23/16 0832 06/23/16 0930   06/23/16 0845  vancomycin (VANCOCIN) 1,500 mg in sodium chloride 0.9 % 500 mL IVPB     1,500 mg 250 mL/hr over 120 Minutes Intravenous  Once 06/23/16 0838 06/23/16 1130     Subjective: Pt says that he is feeling better.    Objective: Vitals:   06/27/16 2228 06/28/16 0300 06/28/16 0532 06/28/16 1100  BP: (!) 153/69  (!) 142/64 129/60  Pulse: 87  92 79  Resp:   18 18  Temp: 97.7 F (36.5 C)  98.1 F (36.7 C) 98.2 F (36.8 C)  TempSrc: Oral  Oral Oral  SpO2: 97%  94% 100%  Weight:  74.1 kg  (163 lb 4.8 oz)    Height:        Intake/Output Summary (Last 24 hours) at 06/28/16 1126 Last data filed at 06/28/16 0900  Gross per 24 hour  Intake              878 ml  Output             1550 ml  Net             -672 ml   Filed Weights   06/26/16 0550 06/27/16 0553 06/28/16 0300  Weight: 78.1 kg (172 lb 1.6 oz) 76.2 kg (167 lb 14.4 oz) 74.1 kg (163 lb 4.8 oz)   Exam:  General exam: awake, alert, sitting up in chair, no apparent distress.  Respiratory system: bibasilar crackles.  Cardiovascular system: S1 & S2 heard. No JVD, murmurs, gallops, clicks or pedal edema. Gastrointestinal system: Abdomen is nondistended, soft and nontender. Normal bowel sounds heard. Central nervous system: Alert and oriented. No focal neurological deficits. Extremities: 2+ edema bilateral LEs. Severe scrotal edema slightly improved.   Data Reviewed: Basic Metabolic Panel:  Recent Labs Lab 06/23/16 1242 06/24/16 0538 06/25/16 0418 06/26/16 0224 06/27/16 0300 06/28/16 0325  NA  --  140 140 141 141  139  K  --  4.3 3.9 3.8 3.8 3.8  CL  --  111 108 109 109 104  CO2  --  20* 22 22 21* 25  GLUCOSE  --  145* 136* 135* 132* 124*  BUN  --  41* 45* 50* 57* 61*  CREATININE  --  1.98* 2.35* 2.82* 2.84* 2.95*  CALCIUM  --  8.7* 8.7* 8.6* 8.6* 8.8*  MG 1.8  --   --   --  1.8  --    Liver Function Tests: No results for input(s): AST, ALT, ALKPHOS, BILITOT, PROT, ALBUMIN in the last 168 hours. No results for input(s): LIPASE, AMYLASE in the last 168 hours. No results for input(s): AMMONIA in the last 168 hours. CBC:  Recent Labs Lab 06/23/16 0705 06/24/16 1406 06/25/16 0418 06/26/16 0224 06/27/16 0300 06/28/16 0325  WBC 16.2* 13.8* 13.7* 11.5* 11.1* 10.0  NEUTROABS 12.1*  --   --   --   --   --   HGB 11.1* 9.9* 10.1* 9.7* 9.6* 9.7*  HCT 35.2* 32.1* 32.8* 31.3* 31.1* 31.7*  MCV 100.0 101.3* 101.2* 101.0* 99.4 99.1  PLT 369 291 295 264 245 239   Cardiac Enzymes:  Recent Labs Lab  06/23/16 0705  TROPONINI 0.13*   CBG (last 3)  No results for input(s): GLUCAP in the last 72 hours. Recent Results (from the past 240 hour(s))  Blood Culture (routine x 2)     Status: None (Preliminary result)   Collection Time: 06/23/16  8:28 AM  Result Value Ref Range Status   Specimen Description BLOOD RIGHT ANTECUBITAL  Final   Special Requests BOTTLES DRAWN AEROBIC AND ANAEROBIC  5CC  Final   Culture NO GROWTH 4 DAYS  Final   Report Status PENDING  Incomplete  Urine culture     Status: None   Collection Time: 06/23/16  8:31 AM  Result Value Ref Range Status   Specimen Description URINE, RANDOM  Final   Special Requests NONE  Final   Culture NO GROWTH  Final   Report Status 06/24/2016 FINAL  Final  Blood Culture (routine x 2)     Status: None (Preliminary result)   Collection Time: 06/23/16  8:35 AM  Result Value Ref Range Status   Specimen Description BLOOD RIGHT ANTECUBITAL  Final   Special Requests BOTTLES DRAWN AEROBIC ONLY  5CC  Final   Culture NO GROWTH 4 DAYS  Final   Report Status PENDING  Incomplete     Studies: Dg Chest Port 1 View  Result Date: 06/27/2016 CLINICAL DATA:  HCAP (healthcare associated pna) CHF, CHF exacerbation. No complaints per pt this am as pt was just waking up and said he couldn't tell how he felt just yet. EXAM: PORTABLE CHEST 1 VIEW COMPARISON:  06/24/2016 FINDINGS: Patchy bilateral airspace lung opacities are without substantial change from the prior exam allowing for differences in patient positioning. Elevation right hemidiaphragm is stable. There are probable small pleural effusions. No pneumothorax. Cardiac silhouette is partly obscured but grossly normal in size. IMPRESSION: 1. No significant change in the bilateral lower lung zone patchy airspace opacities. This may reflect multifocal pneumonia or asymmetric pulmonary edema. Electronically Signed   By: Lajean Manes M.D.   On: 06/27/2016 07:31    Scheduled Meds: . aspirin EC  81 mg Oral  Daily  . clopidogrel  75 mg Oral Daily  . doxycycline  100 mg Oral Q12H  . enoxaparin (LOVENOX) injection  30 mg Subcutaneous Q24H  .  furosemide  80 mg Intravenous BID  . isosorbide mononitrate  30 mg Oral Daily  . metoprolol succinate  75 mg Oral Daily  . multivitamin with minerals  1 tablet Oral Daily  . pantoprazole  40 mg Oral BID  . simvastatin  20 mg Oral QHS  . sodium chloride flush  3 mL Intravenous Q12H   Continuous Infusions: . milrinone 0.125 mcg/kg/min (06/28/16 0646)    Principal Problem:   Acute respiratory failure with hypoxia (HCC) Active Problems:   Elevated troponin   Hyperlipidemia   HYPERTENSION, BENIGN   Hypertrophic obstructive cardiomyopathy (HCC)   Bilateral lower extremity edema   Alcoholic cirrhosis (HCC)   Aortic stenosis, mild   Memory loss   CKD (chronic kidney disease) stage 3, GFR 30-59 ml/min   Iron deficiency anemia due to chronic blood loss   Acute combined systolic and diastolic CHF, NYHA class 1 ([place])   HCAP (healthcare-associated pneumonia)   AKI (acute kidney injury) ([place])   Time spent:   Irwin Brakeman, MD, FAAFP [facility] Pager [phone]  If 7PM-7AM, please contact night-coverage [email or web address] Password [email or web address] 06/28/2016, 11:26 AM    LOS: 5 days

## 2016-06-29 DIAGNOSIS — F1010 Alcohol abuse, uncomplicated: Secondary | ICD-10-CM

## 2016-06-29 DIAGNOSIS — R4189 Other symptoms and signs involving cognitive functions and awareness: Secondary | ICD-10-CM

## 2016-06-29 DIAGNOSIS — R413 Other amnesia: Secondary | ICD-10-CM

## 2016-06-29 LAB — BASIC METABOLIC PANEL
Anion gap: 11 (ref 5–15)
BUN: 66 mg/dL — AB (ref 6–20)
CHLORIDE: 100 mmol/L — AB (ref 101–111)
CO2: 27 mmol/L (ref 22–32)
CREATININE: 2.76 mg/dL — AB (ref 0.61–1.24)
Calcium: 8.6 mg/dL — ABNORMAL LOW (ref 8.9–10.3)
GFR calc Af Amer: 23 mL/min — ABNORMAL LOW (ref >60)
GFR calc non Af Amer: 19 mL/min — ABNORMAL LOW (ref >60)
GLUCOSE: 140 mg/dL — AB (ref 65–99)
Potassium: 3.6 mmol/L (ref 3.5–5.1)
SODIUM: 138 mmol/L (ref 135–145)

## 2016-06-29 LAB — CBC
HCT: 31.4 % — ABNORMAL LOW (ref 39.0–52.0)
Hemoglobin: 9.7 g/dL — ABNORMAL LOW (ref 13.0–17.0)
MCH: 30.6 pg (ref 26.0–34.0)
MCHC: 30.9 g/dL (ref 30.0–36.0)
MCV: 99.1 fL (ref 78.0–100.0)
PLATELETS: 208 10*3/uL (ref 150–400)
RBC: 3.17 MIL/uL — ABNORMAL LOW (ref 4.22–5.81)
RDW: 14.2 % (ref 11.5–15.5)
WBC: 9.8 10*3/uL (ref 4.0–10.5)

## 2016-06-29 NOTE — Progress Notes (Signed)
Physical Therapy Treatment Patient Details Name: [name] MRN: [id number] DOB: [birth year] Today's Date: 06/29/2016    History of Present Illness [name] is a 82 y.o. male with acute respiratory failure and continued elevated troponin from recent NSTEMI was admitted, has PNA as well. On 4L O2 and declining sats with effort.  Past Medical History of HTN, Hypertrophic cardiomyopathy, HLD, ETOH cirrhosis, GERD, PUD, diverticulosis, CKD, NSTEMI.    PT Comments    Pt continues to mobilize slowly with limited activity tolerance. Recommending SNF following acute stay. PT to continue to follow and progress as tolerated.   Follow Up Recommendations  SNF     Equipment Recommendations  None recommended by PT    Recommendations for Other Services       Precautions / Restrictions Precautions Precautions: Fall Restrictions Weight Bearing Restrictions: No    Mobility  Bed Mobility Overal bed mobility: Needs Assistance Bed Mobility: Sit to Supine       Sit to supine: Mod assist   General bed mobility comments: assist needed for LEs getting into bed.   Transfers Overall transfer level: Needs assistance Equipment used: Straight cane Transfers: Sit to/from Stand Sit to Stand: Mod assist         General transfer comment: cues for hand placement, pt refusing use of rw   Ambulation/Gait Ambulation/Gait assistance: Min guard Ambulation Distance (Feet): 12 Feet Assistive device: Straight cane Gait Pattern/deviations: Step-through pattern;Decreased step length - right;Decreased step length - left;Shuffle Gait velocity: slow pattern   General Gait Details: Pt refusing use of rw, using SPC and sink for stability, 4LO2 during ambulation with SpO2 94% following ambulation. Pt requesting return to bed following session.    Stairs            Wheelchair Mobility    Modified Rankin (Stroke Patients Only)       Balance Overall balance assessment: Needs  assistance Sitting-balance support: No upper extremity supported Sitting balance-Leahy Scale: Good     Standing balance support: Single extremity supported Standing balance-Leahy Scale: Poor Standing balance comment: needing UE support                    Cognition Arousal/Alertness: Awake/alert Behavior During Therapy: WFL for tasks assessed/performed Overall Cognitive Status: History of cognitive impairments - at baseline                      Exercises      General Comments        Pertinent Vitals/Pain Pain Assessment: No/denies pain    Home Living Family/patient expects to be discharged to:: Skilled nursing facility                    Prior Function Level of Independence: Independent with assistive device(s)      Comments: Reports using SPC for ambualtion and scooter for getting to dining room of facility.    PT Goals (current goals can now be found in the care plan section) Acute Rehab PT Goals Patient Stated Goal: to get back to [facility] PT Goal Formulation: With patient Time For Goal Achievement: 07/09/16 Potential to Achieve Goals: Good Progress towards PT goals: Progressing toward goals    Frequency  Min 2X/week    PT Plan Current plan remains appropriate    Co-evaluation             End of Session Equipment Utilized During Treatment: Oxygen;Gait belt Activity Tolerance: Patient tolerated treatment well;Patient limited by  fatigue Patient left: in bed;with call bell/phone within reach;with bed alarm set     Time: [id number] PT Time Calculation (min) (ACUTE ONLY): 22 min  Charges:  $Gait Training: 8-22 mins                    G Codes:      Cassell Clement, PT, CSCS Pager [phone] Office [phone]  06/29/2016, 12:55 PM

## 2016-06-29 NOTE — Consult Note (Signed)
[facility] Consult   Reason for Consult:  Capacity evaluation and memory deficits Referring Physician:  Dr. Wynetta Emery Patient Identification: [name] MRN:  [id number] Principal Diagnosis: Acute respiratory failure with hypoxia [facility]) Diagnosis:   Patient Active Problem List   Diagnosis Date Noted  . Venous stasis dermatitis of both lower extremities [I83.11, I83.12]   . AKI (acute kidney injury) ([place]) [N17.9]   . HCAP (healthcare-associated pneumonia) [J18.9] 06/23/2016  . Acute respiratory failure with hypoxia ([place]) [J96.01] 06/23/2016  . Chest pain [R07.9] 06/13/2016  . Acute combined systolic and diastolic CHF, NYHA class 1 ([place]) [I50.41]   . RBBB [I45.10]   . Abnormal lung sounds [R09.89] 05/28/2016  . Gastritis [K29.70] 05/02/2016  . Elevated troponin [R79.89] 05/02/2016  . Demand ischemia of myocardium ([place]) [I24.8] 05/02/2016  . Acute blood loss anemia [D62] 05/02/2016  . Pain in the chest [R07.9] 05/02/2016  . Duodenal ulcer disease [K26.9]   . Melena [K92.1]   . Iron deficiency anemia due to chronic blood loss [D50.0]   . GI bleed [K92.2] 04/13/2016  . CKD (chronic kidney disease) stage 3, GFR 30-59 ml/min [N18.3] 10/11/2015  . Memory loss [R41.3] 10/04/2015  . Hyperglycemia [R73.9] 02/23/2015  . Gross hematuria [R31.0] 08/23/2014  . Aortic stenosis, mild [I35.0] 09/13/2013  . Acute duodenal ulcer with hemorrhage, without mention of obstruction [K26.0] 08/04/2013  . Malignant melanoma of lower extremity or hip, left [C43.72] 03/17/2012  . Routine health maintenance [Z00.00] 03/17/2012  . Alcoholic cirrhosis ([place]) [H60.73] 05/26/2011  . Portal hypertensive gastropathy 05/26/2011  . Bilateral lower extremity edema [R60.0] 10/10/2010  . Alcohol abuse [F10.10] 09/05/2009  . Hyperlipidemia [E78.5] 06/23/2009  . HYPERTENSION, BENIGN [I10] 06/23/2009  . Hypertrophic obstructive cardiomyopathy ([place]) [I42.1] 06/23/2009    Total Time spent with  patient: 45 minutes  Subjective:   [name] is a 82 y.o. male patient admitted with SOB.  HPI:  Patient coming from/Resides with: Independent living at [facility] Complaint: Shortness of breath  HPI: [name] is a 82 y.o. male, resident of Independent living at [facility] admitted with SOB and ahs multiple medical problems.  Patient seen and met with his son who is at bedside. 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 Patient reportedly compliant with his medication treatment and no reported adverse affects. Patient did endorses mild memory deficits and especially name-recalling and also delayed recall is a difficult. Patient knows his first name, last name, age, date of birth and oriented to name of the hospital, location of the hospital, name of the city, county, state and has fund of knowledge. Patient has been able to interpret proverb's. Patient is also able to name some of his chronic and current medical problems and treatment needs without difficulty. Patient son reported patient was not educated about his recent non-STEMI and reportedly getting treatment with the diagnosis for lower extremity edema.   Medical history:He presents with medical history significant for HOCM as well as chronic combined systolic and diastolic heart failure, mild aortic stenosis, hypertension, chronic kidney disease stage III, hypertension, alcoholic cirrhosis with associated chronic bilateral lower extremity edema and chronic stasis dermatitis, memory loss, dyslipidemia, chronic iron deficiency anemia. He was recently hospitalized from 8/4 to 8/7 after presenting with chest pain and shortness of breath. Evaluation revealed findings of a non-STEMI secondary to demand ischemia in setting of chronic kidney disease. Myoview study showed evidence of apparent prior  infarct without reversible ischemia. He was evaluating a  cardiology during that admission. Since that time his return to the ER on 8/9 with stasis dermatitis that was concerning for evolving cellulitis so he was started on doxycycline. Unfortunately according to the son, patient has had increased lower extremity edema and apparently awakened during the night with shortness of breath. Room air saturation confirmed hypoxemia with O2 saturation 89%. He was transported to this facility via EMS.   Past Psychiatric History: None reported except alcohol abuse  Risk to Self: Is patient at risk for suicide?: No Risk to Others:   Prior Inpatient Therapy:   Prior Outpatient Therapy:    Past Medical History:  Past Medical History:  Diagnosis Date  . Acute combined systolic and diastolic CHF, NYHA class 1 ([place])   . Acute respiratory failure ([place])   . Alcoholic cirrhosis ([place])   . 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 Alcoholism ([place])   . Aortic stenosis   . Ascites 11/2004   "early" SBP on paracentesis.   . Chronic gastritis 05/01/2016  . CKD (chronic kidney disease), stage III 10/11/2015  . Colon polyp perhaps in 1990s   pathology/type not known  . Diverticulosis of colon perhaps in 1990s  . Fracture of wrist 2014 spring vs early summer   refused orthopods suggestion to set the bone.   Marland Kitchen GERD (gastroesophageal reflux disease)   . HLD (hyperlipidemia)   . HTN (hypertension)   . Hypertrophic cardiomyopathy ([place])   . Portal hypertensive gastropathy 2006  . Renal insufficiency   . Rhabdomyolysis 01/2005    Past Surgical History:  Procedure Laterality Date  . BASAL CELL CARCINOMA EXCISION Left 11/2012   lower eyelid  . ESOPHAGOGASTRODUODENOSCOPY N/A 08/04/2013   Procedure: ESOPHAGOGASTRODUODENOSCOPY (EGD);  Surgeon: Ladene Artist, MD;  Location: [facility] ENDOSCOPY;  Service: Endoscopy;  Laterality: N/A;  . ESOPHAGOGASTRODUODENOSCOPY (EGD) WITH PROPOFOL N/A 05/01/2016   Procedure: ESOPHAGOGASTRODUODENOSCOPY (EGD) WITH PROPOFOL;  Surgeon: Jerene Bears,  MD;  Location: [facility] ENDOSCOPY;  Service: Endoscopy;  Laterality: N/A;  . INGUINAL HERNIA REPAIR    . MELANOMA EXCISION Left 02/2011   malignant melanoma of thigh   Family History:  Family History  Problem Relation Age of Onset  . Cancer Father 89    lung cancer  . Heart disease Mother   . Heart disease Sister    Family Psychiatric  History: No history of mental illness. Social History:  History  Alcohol Use No    Comment: member of AA     History  Drug Use No    Social History   Social History  . Marital status: Married    Spouse name: N/A  . Number of children: 4  . Years of education: 24   Occupational History  . pathologist Retired    retired   Social History Main Topics  . Smoking status: Never Smoker  . Smokeless tobacco: Never Used  . Alcohol use No     Comment: member of AA  . Drug use: No  . Sexual activity: Not Asked   Other Topics Concern  . 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  Remains independent. ACP- not discussed, will approach at next OV   Additional Social History:    Allergies:  No Known Allergies  Labs:  Results for orders placed or performed during the hospital encounter of 06/23/16 (from the past 48 hour(s))  Basic metabolic panel     Status:  Abnormal   Collection Time: 06/28/16  3:25 AM  Result Value Ref Range   Sodium 139 135 - 145 mmol/L   Potassium 3.8 3.5 - 5.1 mmol/L   Chloride 104 101 - 111 mmol/L   CO2 25 22 - 32 mmol/L   Glucose, Bld 124 (H) 65 - 99 mg/dL   BUN 61 (H) 6 - 20 mg/dL   Creatinine, Ser 2.95 (H) 0.61 - 1.24 mg/dL   Calcium 8.8 (L) 8.9 - 10.3 mg/dL   GFR calc non Af Amer 18 (L) >60 mL/min   GFR calc Af Amer 21 (L) >60 mL/min    Comment: (NOTE) The eGFR has been calculated using the CKD EPI equation. This calculation has not been validated in all clinical situations. eGFR's persistently <60 mL/min signify possible Chronic Kidney Disease.    Anion gap 10 5 -  15  CBC     Status: Abnormal   Collection Time: 06/28/16  3:25 AM  Result Value Ref Range   WBC 10.0 4.0 - 10.5 K/uL   RBC 3.20 (L) 4.22 - 5.81 MIL/uL   Hemoglobin 9.7 (L) 13.0 - 17.0 g/dL   HCT 31.7 (L) 39.0 - 52.0 %   MCV 99.1 78.0 - 100.0 fL   MCH 30.3 26.0 - 34.0 pg   MCHC 30.6 30.0 - 36.0 g/dL   RDW 14.5 11.5 - 15.5 %   Platelets 239 150 - 400 K/uL  Basic metabolic panel     Status: Abnormal   Collection Time: 06/29/16  5:54 AM  Result Value Ref Range   Sodium 138 135 - 145 mmol/L   Potassium 3.6 3.5 - 5.1 mmol/L   Chloride 100 (L) 101 - 111 mmol/L   CO2 27 22 - 32 mmol/L   Glucose, Bld 140 (H) 65 - 99 mg/dL   BUN 66 (H) 6 - 20 mg/dL   Creatinine, Ser 2.76 (H) 0.61 - 1.24 mg/dL   Calcium 8.6 (L) 8.9 - 10.3 mg/dL   GFR calc non Af Amer 19 (L) >60 mL/min   GFR calc Af Amer 23 (L) >60 mL/min    Comment: (NOTE) The eGFR has been calculated using the CKD EPI equation. This calculation has not been validated in all clinical situations. eGFR's persistently <60 mL/min signify possible Chronic Kidney Disease.    Anion gap 11 5 - 15  CBC     Status: Abnormal   Collection Time: 06/29/16  5:54 AM  Result Value Ref Range   WBC 9.8 4.0 - 10.5 K/uL   RBC 3.17 (L) 4.22 - 5.81 MIL/uL   Hemoglobin 9.7 (L) 13.0 - 17.0 g/dL   HCT 31.4 (L) 39.0 - 52.0 %   MCV 99.1 78.0 - 100.0 fL   MCH 30.6 26.0 - 34.0 pg   MCHC 30.9 30.0 - 36.0 g/dL   RDW 14.2 11.5 - 15.5 %   Platelets 208 150 - 400 K/uL    Current Facility-Administered Medications  Medication Dose Route Frequency Provider Last Rate Last Dose  . 0.9 %  sodium chloride infusion  250 mL Intravenous PRN Samella Parr, NP 10 mL/hr at 06/28/16 0646 250 mL at 06/28/16 0646  . acetaminophen (TYLENOL) tablet 650 mg  650 mg Oral Q4H PRN Samella Parr, NP      . ALPRAZolam Duanne Moron) tablet 0.25 mg  0.25 mg Oral QHS PRN Samella Parr, NP      . aspirin EC tablet 81 mg  81 mg Oral Daily Samella Parr, NP  81 mg at 06/29/16 1137  .  clopidogrel (PLAVIX) tablet 75 mg  75 mg Oral Daily Samella Parr, NP   75 mg at 06/29/16 1135  . doxycycline (VIBRA-TABS) tablet 100 mg  100 mg Oral Q12H [place] Marisa Hua, MD   100 mg at 06/29/16 1138  . enoxaparin (LOVENOX) injection 30 mg  30 mg Subcutaneous Q24H [place] L Johnson, MD   30 mg at 06/28/16 1934  . furosemide (LASIX) injection 80 mg  80 mg Intravenous BID Larey Dresser, MD   80 mg at 06/29/16 9563  . isosorbide mononitrate (IMDUR) 24 hr tablet 30 mg  30 mg Oral Daily Samella Parr, NP   30 mg at 06/28/16 1027  . metoprolol succinate (TOPROL-XL) 24 hr tablet 75 mg  75 mg Oral Daily Samella Parr, NP   75 mg at 06/29/16 1135  . milrinone (PRIMACOR) 20 MG/100 ML (0.2 mg/mL) infusion  0.125 mcg/kg/min Intravenous Continuous [name], PA-C 2.9 mL/hr at 06/28/16 0646 0.125 mcg/kg/min at 06/28/16 0646  . multivitamin with minerals tablet 1 tablet  1 tablet Oral Daily Samella Parr, NP   1 tablet at 06/29/16 1135  . ondansetron (ZOFRAN) injection 4 mg  4 mg Intravenous Q6H PRN Samella Parr, NP      . pantoprazole (PROTONIX) EC tablet 40 mg  40 mg Oral BID Samella Parr, NP   40 mg at 06/29/16 1137  . simvastatin (ZOCOR) tablet 20 mg  20 mg Oral QHS Samella Parr, NP   20 mg at 06/28/16 2149  . sodium chloride flush (NS) 0.9 % injection 3 mL  3 mL Intravenous Q12H Samella Parr, NP   3 mL at 06/28/16 2149  . sodium chloride flush (NS) 0.9 % injection 3 mL  3 mL Intravenous PRN Samella Parr, NP      . zolpidem (AMBIEN) tablet 5 mg  5 mg Oral QHS PRN Samella Parr, NP        Musculoskeletal: Strength & Muscle Tone: decreased Gait & Station: unsteady Patient leans: Front  Psychiatric Specialty Exam: Physical Exam  As per history and physical  ROS  Generalized Weakness, SOB and edema of lower extremities  Blood pressure (!) 120/56, pulse 81, temperature 97.9 F (36.6 C), temperature source Oral, resp. rate 20, height 5' 8"  (1.727 m), weight 71.6  kg (157 lb 14.4 oz), SpO2 97 %.Body mass index is 24.01 kg/m.  General Appearance: Casual  Eye Contact:  Good  Speech:  Clear and Coherent  Volume:  Normal  Mood:  Euthymic  Affect:  Appropriate and Congruent  Thought Process:  Coherent and Goal Directed  Orientation:  Full (Time, Place, and Person)  Thought Content:  Logical  Suicidal Thoughts:  No  Homicidal Thoughts:  No  Memory:  Immediate;   Good Recent;   Poor  Judgement:  Intact  Insight:  Fair  Psychomotor Activity:  Decreased  Concentration:  Concentration: Good and Attention Span: Good  Recall:  Poor  Fund of Knowledge:  Good  Language:  Good  Akathisia:  Negative  Handed:  Right  AIMS (if indicated):     Assets:  Communication Skills Desire for Improvement Financial Resources/Insurance Housing Leisure Time Resilience Social Support Transportation  ADL's:  Impaired  Cognition:  Impaired,  Moderate  Sleep:        Treatment Plan Summary:  Based on my evaluation today patient has difficulty with memory especially recalling names and also has difficulty  with the delayed recall. Patient has some difficulties with orientation questions. Patient has good concentration , language functions and abstract thinking.  Patient meets  Criteria for Capacity to make his own medical decisions Reportedly patient wife is his [place] and his son has back up [facility] if needed He will be meeting with a family lawyer to will be updating his legal documents Appreciate psychiatric consultation and we sign off as of today Please contact [phone] or [phone] if needs further assistance   Disposition: Supportive therapy provided about ongoing stressors.  Ambrose Finland, MD 06/29/2016 1:03 PM

## 2016-06-29 NOTE — Progress Notes (Signed)
PROGRESS NOTE    [name]  [id number]  DOB: 02/13/1929  DOA: 06/23/2016 PCP: [name], MD Outpatient Specialists:  Hospital course: [name] is a 82 y.o. male with a Past Medical History of alcoholic cirrhosis, CKD, diverticulosis, GERD, HLD, HTN, cardiomyopathy, renal insufficiency, portal HTN who presents with acute respiratory failure secondary to HCAP, and CHF decompensation. On admission:  O2 sats 89% on RA and tachypneic.    Assessment & Plan:     Acute respiratory failure with hypoxia:  A) Hypertrophic obstructive cardiomyopathy /  Acute on chronic combined systolic and diastolic CHF, NYHA class 1  B) HCAP (healthcare-associated pneumonia) -Patient presents with acute onset of shortness of breath and associated elevated BNP and worsening lower extremity edema concerning for heart failure exacerbation; patient poor historian given dementia but son relates that last week staff noticed patient with dyspnea as well -He also has x-ray findings that could be consistent with a pneumonia process and this is associated with leukocytosis  -Lasix 80 mg IV BID ordered by HF team.  -Weight has increased from 145 pounds in June to 165 pounds in July and admission weight is up to 177 pounds, slowly trending down -Continue preadmission Imdur, Toprol -No ACE I/ARB secondary to chronic kidney disease -Echocardiogram completed last admission: Echocardiogram completed last admission: EF 45-50% with hypokinesis of the anteroseptal and apical myocardium, grade 1 diastolic dysfunction, very mild aortic stenosis  -Daily weights/intake and output, consulted Heart Failure Team.  Pt started on IV milrinone 8/18.  Pt hesitant about starting it but eventually agreed.  -Initially started Broad-spectrum empiric antibiotics with vancomycin and Zosyn with pharmacy dosing, de-escalated 8/17 to doxycycline thru 8/23.  -blood cultures and sputum culture NGTD -Continue supportive care with  oxygen -Early mobilization with physical therapy but patient is very deconditioned and gets very SOB with ambulation.      HYPERTENSION, Essential -Imdur and Toprol as above, norvasc discontinued.    Chronic Bilateral lower extremity edema -Has skin changes concerning for possible cellulitis -Antibiotics as above -Possible component of heart failure contributing -UNNA boot in place prior to admission-consult WOC RN   CKD (chronic kidney disease) stage 3, GFR 30-59 ml/min -Follow electrolytes closely with diuresis -slight bump in creatinine noted but has been stable    Elevated troponin -Mild elevation in troponin at 0.13 which actually demonstrates a downward trend from previous admission peak of 0.81 -Patient currently denying chest pain -Evaluated by cardiology last admission and underwent nuclear medicine Myoview scan. Cardiology documented that the patient was not known to have CAD but has not had ischemic evaluation prior to last admission. The nuclear stress test was consistent with possible old infarction but no acute ischemia. EF on stress test was 38% and EF on echo was 45-50%. Patient refused cardiac catheterization during previous admission. Cardiology opted to manage his non-STEMI with aspirin, beta blocker and Plavix and we have continued at time of admission today    Hyperlipidemia -Continue statin    Alcoholic cirrhosis  -Apparently no longer consuming alcoholic beverages and during previous admission LFTs as well as total bilirubin were normal, but very concerned about his profound fluid weight gain since recent hospitalization, I don't believe that he can independently care for himself any longer.  I think he needs SNF but patient is very against it at this time.  I asked for psychiatry evaluation for capacity as he is not willing to go to SNF and I'm concerned about his safety.  Aortic stenosis, mild -Asymptomatic   Dementia -Does not carry a formal  diagnosis of dementia and uncertain if memory loss more related to prior alcoholism -Also was not on disease modifying medications -Patient's wife died several weeks ago and son who has assumed the POA roll since patient's wife was POA -I asked psychiatry service to evaluate him for capacity for medical decision making.     Iron deficiency anemia due to chronic blood loss -Hemoglobin stable and around baseline between 10.3 and 11.1   DVT prophylaxis: Lovenox  Code Status: DO NOT RESUSCITATE  Family Communication: Son, [name] - I tried to call 8/21 but unable to get answer Disposition Plan: TBD Consults called: cardiology and behavioral health psychiatry  Antimicrobials: Anti-infectives    Start     Dose/Rate Route Frequency Ordered Stop   06/25/16 1400  doxycycline (VIBRA-TABS) tablet 100 mg     100 mg Oral Every 12 hours 06/25/16 1300 07/01/16 0959   06/24/16 1000  vancomycin (VANCOCIN) IVPB 750 mg/150 ml premix  Status:  Discontinued     750 mg 150 mL/hr over 60 Minutes Intravenous Every 24 hours 06/23/16 0903 06/25/16 1300   06/23/16 1500  piperacillin-tazobactam (ZOSYN) IVPB 2.25 g  Status:  Discontinued     2.25 g 100 mL/hr over 30 Minutes Intravenous Every 6 hours 06/23/16 0934 06/23/16 0959   06/23/16 1500  piperacillin-tazobactam (ZOSYN) IVPB 3.375 g  Status:  Discontinued     3.375 g 12.5 mL/hr over 240 Minutes Intravenous Every 6 hours 06/23/16 1000 06/23/16 1006   06/23/16 1500  piperacillin-tazobactam (ZOSYN) IVPB 3.375 g  Status:  Discontinued     3.375 g 12.5 mL/hr over 240 Minutes Intravenous Every 8 hours 06/23/16 1006 06/25/16 1300   06/23/16 0845  piperacillin-tazobactam (ZOSYN) IVPB 3.375 g     3.375 g 100 mL/hr over 30 Minutes Intravenous  Once 06/23/16 0832 06/23/16 0930   06/23/16 0845  vancomycin (VANCOCIN) 1,500 mg in sodium chloride 0.9 % 500 mL IVPB     1,500 mg 250 mL/hr over 120 Minutes Intravenous  Once 06/23/16 [id number] 06/23/16 1130      Subjective: Pt says that he is really diuresing a lot.  His weight overall is down at least 12 pounds  Objective: Vitals:   06/28/16 0532 06/28/16 1100 06/28/16 2053 06/29/16 0546  BP: (!) 142/64 129/60 (!) 133/57 (!) 120/56  Pulse: 92 79 90 81  Resp: 18 18 20 20   Temp: 98.1 F (36.7 C) 98.2 F (36.8 C) 98.2 F (36.8 C) 97.9 F (36.6 C)  TempSrc: Oral Oral Oral Oral  SpO2: 94% 100% 99% 97%  Weight:    71.6 kg (157 lb 14.4 oz)  Height:        Intake/Output Summary (Last 24 hours) at 06/29/16 1117 Last data filed at 06/29/16 0952  Gross per 24 hour  Intake            429.6 ml  Output             1850 ml  Net          -1420.4 ml   Filed Weights   06/27/16 0553 06/28/16 0300 06/29/16 0546  Weight: 76.2 kg (167 lb 14.4 oz) 74.1 kg (163 lb 4.8 oz) 71.6 kg (157 lb 14.4 oz)   Exam:  General exam: awake, alert, sitting up in chair, no apparent distress.  Respiratory system: bibasilar crackles.  Cardiovascular system: S1 & S2 heard. No JVD, murmurs, gallops, clicks or pedal edema.  Gastrointestinal system: Abdomen is nondistended, soft and nontender. Normal bowel sounds heard. Central nervous system: Alert and oriented. No focal neurological deficits. Extremities: 2+ edema bilateral LEs. Severe scrotal edema slightly improved.   Data Reviewed: Basic Metabolic Panel:  Recent Labs Lab 06/23/16 1242  06/25/16 0418 06/26/16 0224 06/27/16 0300 06/28/16 0325 06/29/16 0554  NA  --   < > 140 141 141 139 138  K  --   < > 3.9 3.8 3.8 3.8 3.6  CL  --   < > 108 109 109 104 100*  CO2  --   < > 22 22 21* 25 27  GLUCOSE  --   < > 136* 135* 132* 124* 140*  BUN  --   < > 45* 50* 57* 61* 66*  CREATININE  --   < > 2.35* 2.82* 2.84* 2.95* 2.76*  CALCIUM  --   < > 8.7* 8.6* 8.6* 8.8* 8.6*  MG 1.8  --   --   --  1.8  --   --   < > = values in this interval not displayed. Liver Function Tests: No results for input(s): AST, ALT, ALKPHOS, BILITOT, PROT, ALBUMIN in the last 168  hours. No results for input(s): LIPASE, AMYLASE in the last 168 hours. No results for input(s): AMMONIA in the last 168 hours. CBC:  Recent Labs Lab 06/23/16 0705  06/25/16 0418 06/26/16 0224 06/27/16 0300 06/28/16 0325 06/29/16 0554  WBC 16.2*  < > 13.7* 11.5* 11.1* 10.0 9.8  NEUTROABS 12.1*  --   --   --   --   --   --   HGB 11.1*  < > 10.1* 9.7* 9.6* 9.7* 9.7*  HCT 35.2*  < > 32.8* 31.3* 31.1* 31.7* 31.4*  MCV 100.0  < > 101.2* 101.0* 99.4 99.1 99.1  PLT 369  < > 295 264 245 239 208  < > = values in this interval not displayed. Cardiac Enzymes:  Recent Labs Lab 06/23/16 0705  TROPONINI 0.13*   CBG (last 3)  No results for input(s): GLUCAP in the last 72 hours. Recent Results (from the past 240 hour(s))  Blood Culture (routine x 2)     Status: None   Collection Time: 06/23/16  8:28 AM  Result Value Ref Range Status   Specimen Description BLOOD RIGHT ANTECUBITAL  Final   Special Requests BOTTLES DRAWN AEROBIC AND ANAEROBIC  5CC  Final   Culture NO GROWTH 5 DAYS  Final   Report Status 06/28/2016 FINAL  Final  Urine culture     Status: None   Collection Time: 06/23/16  8:31 AM  Result Value Ref Range Status   Specimen Description URINE, RANDOM  Final   Special Requests NONE  Final   Culture NO GROWTH  Final   Report Status 06/24/2016 FINAL  Final  Blood Culture (routine x 2)     Status: None   Collection Time: 06/23/16  8:35 AM  Result Value Ref Range Status   Specimen Description BLOOD RIGHT ANTECUBITAL  Final   Special Requests BOTTLES DRAWN AEROBIC ONLY  5CC  Final   Culture NO GROWTH 5 DAYS  Final   Report Status 06/28/2016 FINAL  Final     Studies: No results found.  Scheduled Meds: . aspirin EC  81 mg Oral Daily  . clopidogrel  75 mg Oral Daily  . doxycycline  100 mg Oral Q12H  . enoxaparin (LOVENOX) injection  30 mg Subcutaneous Q24H  . furosemide  80 mg  Intravenous BID  . isosorbide mononitrate  30 mg Oral Daily  . metoprolol succinate  75 mg Oral  Daily  . multivitamin with minerals  1 tablet Oral Daily  . pantoprazole  40 mg Oral BID  . simvastatin  20 mg Oral QHS  . sodium chloride flush  3 mL Intravenous Q12H   Continuous Infusions: . milrinone 0.125 mcg/kg/min (06/28/16 0646)    Principal Problem:   Acute respiratory failure with hypoxia (HCC) Active Problems:   Elevated troponin   Hyperlipidemia   HYPERTENSION, BENIGN   Hypertrophic obstructive cardiomyopathy (HCC)   Bilateral lower extremity edema   Alcoholic cirrhosis (HCC)   Aortic stenosis, mild   Memory loss   CKD (chronic kidney disease) stage 3, GFR 30-59 ml/min   Iron deficiency anemia due to chronic blood loss   Acute combined systolic and diastolic CHF, NYHA class 1 ([place])   HCAP (healthcare-associated pneumonia)   AKI (acute kidney injury) ([place])   Venous stasis dermatitis of both lower extremities  Time spent:   Irwin Brakeman, MD, FAAFP [facility] Pager [phone]  If 7PM-7AM, please contact night-coverage [email or web address] Password [email or web address] 06/29/2016, 11:17 AM    LOS: 6 days

## 2016-06-29 NOTE — NC FL2 (Signed)
[place] LEVEL OF CARE SCREENING TOOL     IDENTIFICATION  Patient Name: [name] Birthdate: 1929/03/11 Sex: male Admission Date (Current Location): 06/23/2016  [facility] and [place] Number:  Herbalist and Address:  [facility] [place]. [facility], [place] [address], [place], [place] [address]      Provider Number: [id number]  Attending Physician Name and Address:  Murlean Iba, MD  Relative Name and Phone Number:       Current Level of Care: Hospital Recommended Level of Care: [place] Prior Approval Number:    Date Approved/Denied:   PASRR Number: [id number]  Discharge Plan: SNF    Current Diagnoses: Patient Active Problem List   Diagnosis Date Noted  . Venous stasis dermatitis of both lower extremities   . AKI (acute kidney injury) ([place])   . HCAP (healthcare-associated pneumonia) 06/23/2016  . Acute respiratory failure with hypoxia ([place]) 06/23/2016  . Chest pain 06/13/2016  . Acute combined systolic and diastolic CHF, NYHA class 1 ([place])   . RBBB   . Abnormal lung sounds 05/28/2016  . Gastritis 05/02/2016  . Elevated troponin 05/02/2016  . Demand ischemia of myocardium ([place]) 05/02/2016  . Acute blood loss anemia 05/02/2016  . Pain in the chest 05/02/2016  . Duodenal ulcer disease   . Melena   . Iron deficiency anemia due to chronic blood loss   . GI bleed 04/13/2016  . CKD (chronic kidney disease) stage 3, GFR 30-59 ml/min 10/11/2015  . Memory loss 10/04/2015  . Hyperglycemia 02/23/2015  . Gross hematuria 08/23/2014  . Aortic stenosis, mild 09/13/2013  . Acute duodenal ulcer with hemorrhage, without mention of obstruction 08/04/2013  . Malignant melanoma of lower extremity or hip, left 03/17/2012  . Routine health maintenance 03/17/2012  . Alcoholic cirrhosis ([place]) 123456  . Portal hypertensive gastropathy 05/26/2011  . Bilateral lower extremity edema 10/10/2010  . Alcohol abuse 09/05/2009  .  Hyperlipidemia 06/23/2009  . HYPERTENSION, BENIGN 06/23/2009  . Hypertrophic obstructive cardiomyopathy ([place]) 06/23/2009    Orientation RESPIRATION BLADDER Height & Weight     Self, Time, Situation, Place  O2 (4L) Continent Weight: 157 lb 14.4 oz (71.6 kg) (b scale) Height:  5\' 8"  (172.7 cm)  BEHAVIORAL SYMPTOMS/MOOD NEUROLOGICAL BOWEL NUTRITION STATUS      Continent Diet (Heart Healthy / Thin Liquid)  AMBULATORY STATUS COMMUNICATION OF NEEDS Skin   Limited Assist Verbally Other (Comment) ([place]s on legs)                       Personal Care Assistance Level of Assistance  Bathing, Feeding, Dressing Bathing Assistance: Limited assistance Feeding assistance: Independent Dressing Assistance: Limited assistance     Functional Limitations Info  Hearing, Speech, Sight Sight Info: Impaired Hearing Info: Impaired Speech Info: Adequate    SPECIAL CARE FACTORS FREQUENCY  PT (By licensed PT), OT (By licensed OT)     PT Frequency: 3 OT Frequency: 3            Contractures Contractures Info: Not present    Additional Factors Info  Code Status, Allergies Code Status Info: DNR Allergies Info: No Known Allergies           Current Medications (06/29/2016):  This is the current hospital active medication list Current Facility-Administered Medications  Medication Dose Route Frequency Provider Last Rate Last Dose  . 0.9 %  sodium chloride infusion  250 mL Intravenous PRN Samella Parr, NP 10 mL/hr at  06/28/16 0646 250 mL at 06/28/16 0646  . acetaminophen (TYLENOL) tablet 650 mg  650 mg Oral Q4H PRN Samella Parr, NP      . ALPRAZolam Duanne Moron) tablet 0.25 mg  0.25 mg Oral QHS PRN Samella Parr, NP      . aspirin EC tablet 81 mg  81 mg Oral Daily Samella Parr, NP   81 mg at 06/29/16 1137  . clopidogrel (PLAVIX) tablet 75 mg  75 mg Oral Daily Samella Parr, NP   75 mg at 06/29/16 1135  . doxycycline (VIBRA-TABS) tablet 100 mg  100 mg Oral Q12H [place] Marisa Hua,  MD   100 mg at 06/29/16 1138  . enoxaparin (LOVENOX) injection 30 mg  30 mg Subcutaneous Q24H Clanford L Johnson, MD   30 mg at 06/28/16 1934  . furosemide (LASIX) injection 80 mg  80 mg Intravenous BID Larey Dresser, MD   80 mg at 06/29/16 [id number]  . isosorbide mononitrate (IMDUR) 24 hr tablet 30 mg  30 mg Oral Daily Samella Parr, NP   30 mg at 06/28/16 1027  . metoprolol succinate (TOPROL-XL) 24 hr tablet 75 mg  75 mg Oral Daily Samella Parr, NP   75 mg at 06/29/16 1135  . milrinone (PRIMACOR) 20 MG/100 ML (0.2 mg/mL) infusion  0.125 mcg/kg/min Intravenous Continuous [name], PA-C 2.9 mL/hr at 06/28/16 0646 0.125 mcg/kg/min at 06/28/16 0646  . multivitamin with minerals tablet 1 tablet  1 tablet Oral Daily Samella Parr, NP   1 tablet at 06/29/16 1135  . ondansetron (ZOFRAN) injection 4 mg  4 mg Intravenous Q6H PRN Samella Parr, NP      . pantoprazole (PROTONIX) EC tablet 40 mg  40 mg Oral BID Samella Parr, NP   40 mg at 06/29/16 1137  . simvastatin (ZOCOR) tablet 20 mg  20 mg Oral QHS Samella Parr, NP   20 mg at 06/28/16 2149  . sodium chloride flush (NS) 0.9 % injection 3 mL  3 mL Intravenous Q12H Samella Parr, NP   3 mL at 06/28/16 2149  . sodium chloride flush (NS) 0.9 % injection 3 mL  3 mL Intravenous PRN Samella Parr, NP      . zolpidem (AMBIEN) tablet 5 mg  5 mg Oral QHS PRN Samella Parr, NP         Discharge Medications: Please see discharge summary for a list of discharge medications.  Relevant Imaging Results:  Relevant Lab Results:   Additional Information SSN [id number]  Candie Chroman, LCSW

## 2016-06-29 NOTE — Care Management Important Message (Signed)
Important Message  Patient Details  Name: [name] MRN: [id number] Date of Birth: 1929/09/26   Medicare Important Message Given:  Yes    Makiya Jeune 06/29/2016, 11:40 AM

## 2016-06-29 NOTE — Progress Notes (Signed)
Patient ID: [name], male   DOB: 1928-11-21, 82 y.o.   MRN: [id number]    Advanced Heart Failure Team Progress Note  Referring Physician: Dr. Murvin Natal Primary Physician: [name], MD Primary Cardiologist:  Dr. Angelena Form  Reason for Consultation: A/C combined CHF  HPI:    Feeling OK today.  Says lasix is a "nuisance" continues to pee more than recorded per patient.   Creatinine elevated but stable. 2.94 -> 2.76. Improved somewhat with no metolazone and decreased lasix yesterday.   Out 1.1 L yesterday and an additional 1.0 today. Weight shows down 6 lbs. (18 lbs overall)  Scheduled Meds: . aspirin EC  81 mg Oral Daily  . clopidogrel  75 mg Oral Daily  . doxycycline  100 mg Oral Q12H  . enoxaparin (LOVENOX) injection  30 mg Subcutaneous Q24H  . furosemide  80 mg Intravenous BID  . isosorbide mononitrate  30 mg Oral Daily  . metoprolol succinate  75 mg Oral Daily  . multivitamin with minerals  1 tablet Oral Daily  . pantoprazole  40 mg Oral BID  . simvastatin  20 mg Oral QHS  . sodium chloride flush  3 mL Intravenous Q12H   Continuous Infusions: . milrinone 0.125 mcg/kg/min (06/28/16 0646)   PRN Meds:.sodium chloride, acetaminophen, ALPRAZolam, ondansetron (ZOFRAN) IV, sodium chloride flush, zolpidem    Past Medical History: Past Medical History:  Diagnosis Date  . Acute combined systolic and diastolic CHF, NYHA class 1 ([place])   . Acute respiratory failure ([place])   . Alcoholic cirrhosis ([place])   . 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 Alcoholism ([place])   . Aortic stenosis   . Ascites 11/2004   "early" SBP on paracentesis.   . Chronic gastritis 05/01/2016  . CKD (chronic kidney disease), stage III 10/11/2015  . Colon polyp perhaps in 1990s   pathology/type not known  . Diverticulosis of colon perhaps in 1990s  . Fracture of wrist 2014 spring vs early summer   refused orthopods suggestion to set the bone.   Marland Kitchen GERD (gastroesophageal reflux disease)   . HLD  (hyperlipidemia)   . HTN (hypertension)   . Hypertrophic cardiomyopathy ([place])   . Portal hypertensive gastropathy 2006  . Renal insufficiency   . Rhabdomyolysis 01/2005    Past Surgical History: Past Surgical History:  Procedure Laterality Date  . BASAL CELL CARCINOMA EXCISION Left 11/2012   lower eyelid  . ESOPHAGOGASTRODUODENOSCOPY N/A 08/04/2013   Procedure: ESOPHAGOGASTRODUODENOSCOPY (EGD);  Surgeon: Ladene Artist, MD;  Location[facility] ENDOSCOPY;  Service: Endoscopy;  Laterality: N/A;  . ESOPHAGOGASTRODUODENOSCOPY (EGD) WITH PROPOFOL N/A 05/01/2016   Procedure: ESOPHAGOGASTRODUODENOSCOPY (EGD) WITH PROPOFOL;  Surgeon: Jerene Bears, MD;  Location: [facility] ENDOSCOPY;  Service: Endoscopy;  Laterality: N/A;  . INGUINAL HERNIA REPAIR    . MELANOMA EXCISION Left 02/2011   malignant melanoma of thigh    Family History: Family History  Problem Relation Age of Onset  . Cancer Father 55    lung cancer  . Heart disease Mother   . Heart disease Sister     Social History: Social History   Social History  . Marital status: Married    Spouse name: N/A  . Number of children: 4  . Years of education: 24   Occupational History  . pathologist Retired    retired   Social History Main Topics  . Smoking status: Never Smoker  . Smokeless tobacco: Never Used  . Alcohol use No     Comment: member of  [employer]  . Drug use: No  . Sexual activity: Not Asked   Other Topics Concern  . 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  Remains independent. ACP- not discussed, will approach at next OV    Allergies:  No Known Allergies  Objective:    Vital Signs:   Temp:  [97.9 F (36.6 C)-98.2 F (36.8 C)] 97.9 F (36.6 C) (08/21 0546) Pulse Rate:  [79-90] 81 (08/21 0546) Resp:  [18-20] 20 (08/21 0546) BP: (120-133)/(56-60) 120/56 (08/21 0546) SpO2:  [97 %-100 %] 97 % (08/21 0546) Weight:  [157 lb 14.4 oz (71.6 kg)] 157 lb 14.4 oz (71.6 kg) (08/21  0546) Last BM Date: 06/25/16  Weight change: Filed Weights   06/27/16 0553 06/28/16 0300 06/29/16 0546  Weight: 167 lb 14.4 oz (76.2 kg) 163 lb 4.8 oz (74.1 kg) 157 lb 14.4 oz (71.6 kg)    Intake/Output:   Intake/Output Summary (Last 24 hours) at 06/29/16 0838 Last data filed at 06/29/16 0800  Gross per 24 hour  Intake            309.6 ml  Output             1850 ml  Net          -1540.4 ml     Physical Exam: General:  Elderly appearing.  HEENT: normal Neck: supple. JVP 10-11 cm. Carotids 2+ bilat; no bruits. No thyromegaly or nodule noted.  Cor: PMI nondisplaced. RRR. No rubs, gallops. 2/6 AS murmur. Lungs: Basilar crackles. Diminished R>L  Abdomen: soft, NT, ND, no HSM. No bruits or masses. +BS  Extremities: no cyanosis, clubbing, rash. Continues with 1-2+ edema into thighs. Mid penile and scrotal edema persists. UNNA boots in place.  Neuro: alert & orientedx3, cranial nerves grossly intact. moves all 4 extremities w/o difficulty. Affect pleasant  Telemetry: Reviewed, NSR  Labs: Basic Metabolic Panel:  Recent Labs Lab 06/23/16 1242  06/25/16 0418 06/26/16 0224 06/27/16 0300 06/28/16 0325 06/29/16 0554  NA  --   < > 140 141 141 139 138  K  --   < > 3.9 3.8 3.8 3.8 3.6  CL  --   < > 108 109 109 104 100*  CO2  --   < > 22 22 21* 25 27  GLUCOSE  --   < > 136* 135* 132* 124* 140*  BUN  --   < > 45* 50* 57* 61* 66*  CREATININE  --   < > 2.35* 2.82* 2.84* 2.95* 2.76*  CALCIUM  --   < > 8.7* 8.6* 8.6* 8.8* 8.6*  MG 1.8  --   --   --  1.8  --   --   < > = values in this interval not displayed.  Liver Function Tests: No results for input(s): AST, ALT, ALKPHOS, BILITOT, PROT, ALBUMIN in the last 168 hours. No results for input(s): LIPASE, AMYLASE in the last 168 hours. No results for input(s): AMMONIA in the last 168 hours.  CBC:  Recent Labs Lab 06/23/16 0705  06/25/16 0418 06/26/16 0224 06/27/16 0300 06/28/16 0325 06/29/16 0554  WBC 16.2*  < > 13.7* 11.5*  11.1* 10.0 9.8  NEUTROABS 12.1*  --   --   --   --   --   --   HGB 11.1*  < > 10.1* 9.7* 9.6* 9.7* 9.7*  HCT 35.2*  < > 32.8* 31.3* 31.1* 31.7* 31.4*  MCV 100.0  < >  101.2* 101.0* 99.4 99.1 99.1  PLT 369  < > 295 264 245 239 208  < > = values in this interval not displayed.  Cardiac Enzymes:  Recent Labs Lab 06/23/16 0705  TROPONINI 0.13*    BNP: BNP (last 3 results)  Recent Labs  04/13/16 1046 06/12/16 0556 06/23/16 0705  BNP 107.2* 637.8* 1,426.3*    ProBNP (last 3 results)  Recent Labs  07/22/15 1536  PROBNP 61.0     CBG: No results for input(s): GLUCAP in the last 168 hours.  Coagulation Studies: No results for input(s): LABPROT, INR in the last 72 hours.  Other results:   Imaging: No results found.   Medications:     Current Medications: . aspirin EC  81 mg Oral Daily  . clopidogrel  75 mg Oral Daily  . doxycycline  100 mg Oral Q12H  . enoxaparin (LOVENOX) injection  30 mg Subcutaneous Q24H  . furosemide  80 mg Intravenous BID  . isosorbide mononitrate  30 mg Oral Daily  . metoprolol succinate  75 mg Oral Daily  . multivitamin with minerals  1 tablet Oral Daily  . pantoprazole  40 mg Oral BID  . simvastatin  20 mg Oral QHS  . sodium chloride flush  3 mL Intravenous Q12H    Infusions: . milrinone 0.125 mcg/kg/min (06/28/16 0646)     Assessment   1. Acute on chronic combined CHF, ICM. EF ~40-45% with mid to distal anterior, anteroseptal and apical HK 2. HCAP 3. CAD with previous silent LAD infarct 4. Aortic Stenosis, Mild 5. Alcoholic Cirrhosis 6. AKI on CKD stage 3 7. RBBB   Plan   Suspect low output physiology with increase response on milrinone.   Continue milrinone 0.125 mcg/kg/min. Weight down 18 lbs overall.     He remains volume overloaded on exam. Will continue IV lasix 80 mg BID for at least today. He was only on 40 mg daily po PTA.   Creatinine elevated but stable. Suspect cardiorenal.  May limit further diuresis.    - With rise in creatinine, no metolazone today and will decrease Lasix to bid.    Echo 06/15/16 LVEF 45-50% with Grade 1 DD. Mild AS + AI. EF decreased from 60-65% in June. Likely ischemic with recent NSTEMI, although patient refused catheterization.  With age, co-morbidities, and recent refusal of LHC, will need to discuss [place] while in hospital.  From discussions so far he is hesitant to undergo any aggressive treatments.  He did however agreed to trial inotropic support in attempt to augment diuresis.   BCx and UCx NGTD.  Remains on Doxycycline per primary team.   Previously at independent.  PT recommending SNF stay on d/c.  Pt agrees to discuss with social work.  He would absolutely consider ALF.   Length of Stay: [address]  Annamaria Helling 06/29/2016, 8:38 AM  Advanced Heart Failure Team Pager [phone] (M-F; 7a - 4p)  Please contact [facility] for night-coverage after hours (4p -7a ) and weekends on [email or web address]  Patient seen and examined with Oda Kilts, PA-C. We discussed all aspects of the encounter. I agree with the assessment and plan as stated above.   Volume status improving slowly but coming off. Now down 18 pounds. Still with at least 10-15 pounds to go. Creatinine improving with reducing lasix dosing. Long talk with him and his son about post hospital care. He is agreeable to go to SNF at [facility] before going back to [place] at [facility]. Refuses cath.  Bensimhon, Daniel,MD 10:31 PM

## 2016-06-30 ENCOUNTER — Inpatient Hospital Stay: Payer: Self-pay | Admitting: Internal Medicine

## 2016-06-30 LAB — MAGNESIUM: MAGNESIUM: 1.6 mg/dL — AB (ref 1.7–2.4)

## 2016-06-30 LAB — BASIC METABOLIC PANEL
ANION GAP: 10 (ref 5–15)
BUN: 76 mg/dL — AB (ref 6–20)
CHLORIDE: 98 mmol/L — AB (ref 101–111)
CO2: 30 mmol/L (ref 22–32)
Calcium: 8.5 mg/dL — ABNORMAL LOW (ref 8.9–10.3)
Creatinine, Ser: 2.67 mg/dL — ABNORMAL HIGH (ref 0.61–1.24)
GFR, EST AFRICAN AMERICAN: 23 mL/min — AB (ref >60)
GFR, EST NON AFRICAN AMERICAN: 20 mL/min — AB (ref >60)
Glucose, Bld: 137 mg/dL — ABNORMAL HIGH (ref 65–99)
POTASSIUM: 3.6 mmol/L (ref 3.5–5.1)
SODIUM: 138 mmol/L (ref 135–145)

## 2016-06-30 NOTE — Progress Notes (Signed)
Patient ID: [name], male   DOB: 10-30-1929, 82 y.o.   MRN: [id number]    Advanced Heart Failure Team Progress Note  Referring Physician: Dr. Murvin Natal Primary Physician: [name], MD Primary Cardiologist:  Dr. Angelena Form  Reason for Consultation: A/C combined CHF  HPI:    Continues to improve. Urinating well with some incontinence. Denies SOB. PT recommending SNF. Pt alternating between agreeing and saying he would prefer ALF at [place] (PTA was independent living there)  Creatinine elevated but stable. 2.94 -> 2.76 -> 2.67 with decreased lasix and no metolazone.   Out 500 cc (inaccurate) and down another 5 lbs. (23 lbs overall)  Scheduled Meds: . aspirin EC  81 mg Oral Daily  . clopidogrel  75 mg Oral Daily  . doxycycline  100 mg Oral Q12H  . enoxaparin (LOVENOX) injection  30 mg Subcutaneous Q24H  . furosemide  80 mg Intravenous BID  . isosorbide mononitrate  30 mg Oral Daily  . metoprolol succinate  75 mg Oral Daily  . multivitamin with minerals  1 tablet Oral Daily  . pantoprazole  40 mg Oral BID  . simvastatin  20 mg Oral QHS  . sodium chloride flush  3 mL Intravenous Q12H   Continuous Infusions: . milrinone 0.125 mcg/kg/min (06/29/16 1812)   PRN Meds:.sodium chloride, acetaminophen, ALPRAZolam, ondansetron (ZOFRAN) IV, sodium chloride flush, zolpidem    Past Medical History: Past Medical History:  Diagnosis Date  . Acute combined systolic and diastolic CHF, NYHA class 1 ([place])   . Acute respiratory failure ([place])   . Alcoholic cirrhosis ([place])   . 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 Alcoholism ([place])   . Aortic stenosis   . Ascites 11/2004   "early" SBP on paracentesis.   . Chronic gastritis 05/01/2016  . CKD (chronic kidney disease), stage III 10/11/2015  . Colon polyp perhaps in 1990s   pathology/type not known  . Diverticulosis of colon perhaps in 1990s  . Fracture of wrist 2014 spring vs early summer   refused orthopods suggestion to set  the bone.   Marland Kitchen GERD (gastroesophageal reflux disease)   . HLD (hyperlipidemia)   . HTN (hypertension)   . Hypertrophic cardiomyopathy ([place])   . Portal hypertensive gastropathy 2006  . Renal insufficiency   . Rhabdomyolysis 01/2005    Past Surgical History: Past Surgical History:  Procedure Laterality Date  . BASAL CELL CARCINOMA EXCISION Left 11/2012   lower eyelid  . ESOPHAGOGASTRODUODENOSCOPY N/A 08/04/2013   Procedure: ESOPHAGOGASTRODUODENOSCOPY (EGD);  Surgeon: Ladene Artist, MD;  Location: [facility] ENDOSCOPY;  Service: Endoscopy;  Laterality: N/A;  . ESOPHAGOGASTRODUODENOSCOPY (EGD) WITH PROPOFOL N/A 05/01/2016   Procedure: ESOPHAGOGASTRODUODENOSCOPY (EGD) WITH PROPOFOL;  Surgeon: Jerene Bears, MD;  Location: [facility] ENDOSCOPY;  Service: Endoscopy;  Laterality: N/A;  . INGUINAL HERNIA REPAIR    . MELANOMA EXCISION Left 02/2011   malignant melanoma of thigh    Family History: Family History  Problem Relation Age of Onset  . Cancer Father 2    lung cancer  . Heart disease Mother   . Heart disease Sister     Social History: Social History   Social History  . Marital status: Married    Spouse name: N/A  . Number of children: 4  . Years of education: 24   Occupational History  . pathologist Retired    retired   Social History Main Topics  . Smoking status: Never Smoker  . Smokeless tobacco: Never Used  . Alcohol use No  Comment: member of AA  . Drug use: No  . Sexual activity: Not Asked   Other Topics Concern  . 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  Remains independent. ACP- not discussed, will approach at next OV    Allergies:  No Known Allergies  Objective:    Vital Signs:   Temp:  [98.1 F (36.7 C)] 98.1 F (36.7 C) (08/21 1439) Pulse Rate:  [79] 79 (08/21 1439) Resp:  [20] 20 (08/21 1439) BP: (122)/(54) 122/54 (08/21 1439) SpO2:  [100 %] 100 % (08/21 1439) Weight:  [152 lb 12.8 oz (69.3 kg)] 152 lb  12.8 oz (69.3 kg) (08/22 0618) Last BM Date: 06/29/16  Weight change: Filed Weights   06/28/16 0300 06/29/16 0546 06/30/16 0618  Weight: 163 lb 4.8 oz (74.1 kg) 157 lb 14.4 oz (71.6 kg) 152 lb 12.8 oz (69.3 kg)    Intake/Output:   Intake/Output Summary (Last 24 hours) at 06/30/16 0849 Last data filed at 06/30/16 0406  Gross per 24 hour  Intake          1390.69 ml  Output             1125 ml  Net           265.69 ml     Physical Exam: General:  Elderly appearing.  HEENT: normal Neck: supple. JVP 8-9 cm. Carotids 2+ bilat; no bruits. No thyromegaly or nodule noted.  Cor: PMI nondisplaced. RRR. No rubs, gallops. 2/6 AS murmur. Lungs: Bi-basilar crackles, normal effort Abdomen: soft, NT, ND, no HSM. No bruits or masses. +BS  Extremities: no cyanosis, clubbing, rash. 1+ thigh edema, slowly improving. Mild penile and scrotal edema persists. Bilateral UNNA boots present.  Neuro: alert & orientedx3, cranial nerves grossly intact. moves all 4 extremities w/o difficulty. Affect pleasant  Telemetry: Reviewed, NSR  Labs: Basic Metabolic Panel:  Recent Labs Lab 06/23/16 1242  06/26/16 0224 06/27/16 0300 06/28/16 0325 06/29/16 0554 06/30/16 0508  NA  --   < > 141 141 139 138 138  K  --   < > 3.8 3.8 3.8 3.6 3.6  CL  --   < > 109 109 104 100* 98*  CO2  --   < > 22 21* 25 27 30   GLUCOSE  --   < > 135* 132* 124* 140* 137*  BUN  --   < > 50* 57* 61* 66* 76*  CREATININE  --   < > 2.82* 2.84* 2.95* 2.76* 2.67*  CALCIUM  --   < > 8.6* 8.6* 8.8* 8.6* 8.5*  MG 1.8  --   --  1.8  --   --   --   < > = values in this interval not displayed.  Liver Function Tests: No results for input(s): AST, ALT, ALKPHOS, BILITOT, PROT, ALBUMIN in the last 168 hours. No results for input(s): LIPASE, AMYLASE in the last 168 hours. No results for input(s): AMMONIA in the last 168 hours.  CBC:  Recent Labs Lab 06/25/16 0418 06/26/16 0224 06/27/16 0300 06/28/16 0325 06/29/16 0554  WBC 13.7*  11.5* 11.1* 10.0 9.8  HGB 10.1* 9.7* 9.6* 9.7* 9.7*  HCT 32.8* 31.3* 31.1* 31.7* 31.4*  MCV 101.2* 101.0* 99.4 99.1 99.1  PLT 295 264 245 239 208    Cardiac Enzymes: No results for input(s): CKTOTAL, CKMB, CKMBINDEX, TROPONINI in the last 168 hours.  BNP: BNP (last 3 results)  Recent Labs  04/13/16 1046  06/12/16 0556 06/23/16 0705  BNP 107.2* 637.8* 1,426.3*    ProBNP (last 3 results)  Recent Labs  07/22/15 1536  PROBNP 61.0     CBG: No results for input(s): GLUCAP in the last 168 hours.  Coagulation Studies: No results for input(s): LABPROT, INR in the last 72 hours.  Other results:   Imaging: No results found.   Medications:     Current Medications: . aspirin EC  81 mg Oral Daily  . clopidogrel  75 mg Oral Daily  . doxycycline  100 mg Oral Q12H  . enoxaparin (LOVENOX) injection  30 mg Subcutaneous Q24H  . furosemide  80 mg Intravenous BID  . isosorbide mononitrate  30 mg Oral Daily  . metoprolol succinate  75 mg Oral Daily  . multivitamin with minerals  1 tablet Oral Daily  . pantoprazole  40 mg Oral BID  . simvastatin  20 mg Oral QHS  . sodium chloride flush  3 mL Intravenous Q12H    Infusions: . milrinone 0.125 mcg/kg/min (06/29/16 1812)     Assessment   1. Acute on chronic combined CHF, ICM. EF ~40-45% with mid to distal anterior, anteroseptal and apical HK 2. HCAP 3. CAD with previous silent LAD infarct 4. Aortic Stenosis, Mild 5. Alcoholic Cirrhosis 6. AKI on CKD stage 3 7. RBBB   Plan   Suspect low output physiology with increased response on milrinone.   Continue milrinone 0.125 mcg/kg/min for today. Will wean off prior to d/c. Weight down 23 lbs overall.     His volume status is improving, but he still has significant peripheral edema. Suspect we may be nearing as much as we will get off. Continue IV lasix 80 mg BID for today. Will need to adjust regimen for home.   Creatinine trending down but BUN continues to rise.   Continue to follow closely with diuresis.     Echo 06/15/16 LVEF 45-50% with Grade 1 DD. Mild AS + AI. EF decreased from 60-65% in June. Likely ischemic with recent NSTEMI, although patient refused catheterization.  DNR/DNI.  Continue IV lasix and milrinone.   BCx and UCx remain negative to date. Remains on Doxycycline per primary team.   Previously at independent living.  Pt debating SNF vs ALF. Discussions ongoing.   Length of Stay: [address]  Annamaria Helling 06/30/2016, 8:49 AM  Advanced Heart Failure Team Pager [phone] (M-F; 7a - 4p)  Please contact [facility] for night-coverage after hours (4p -7a ) and weekends on [email or web address]  Patient seen and examined with Oda Kilts, PA-C. We discussed all aspects of the encounter. I agree with the assessment and plan as stated above.   Volume status improving. Renal function slightly better. He is eager to be discharged. Will continue lasix 80 IV bid today and possibly tomorrow. Tentative plans for d/c to [facility] unit Thursday if possible. Would use demadex 60 bid once we stop IV lasix. Wean milrinone tomorrow.   Encarnacion Scioneaux,MD 9:19 PM

## 2016-06-30 NOTE — Progress Notes (Signed)
PROGRESS NOTE    [name]  [id number]  DOB: 02/22/1929  DOA: 06/23/2016 PCP: [name], MD Outpatient Specialists:  Hospital course: [name] is a 82 y.o. male with a Past Medical History of alcoholic cirrhosis, CKD, diverticulosis, GERD, HLD, HTN, cardiomyopathy, renal insufficiency, portal HTN who presents with acute respiratory failure secondary to HCAP, and CHF decompensation. On admission:  O2 sats 89% on RA and tachypneic.    Assessment & Plan:     Acute respiratory failure with hypoxia:  A) Hypertrophic obstructive cardiomyopathy /  Acute on chronic combined systolic and diastolic CHF, NYHA class 1  B) HCAP (healthcare-associated pneumonia) -Patient presents with acute onset of shortness of breath and associated elevated BNP and worsening lower extremity edema concerning for heart failure exacerbation; patient poor historian given dementia but son relates that last week staff noticed patient with dyspnea as well -He also has x-ray findings that could be consistent with a pneumonia process and this is associated with leukocytosis  -Lasix 80 mg IV BID ordered by HF team.  -Weight has increased from 145 pounds in June to 165 pounds in July and admission weight is up to 177 pounds, slowly trending down -Continue preadmission Imdur, Toprol -No ACE I/ARB secondary to chronic kidney disease -Echocardiogram completed last admission: Echocardiogram completed last admission: EF 45-50% with hypokinesis of the anteroseptal and apical myocardium, grade 1 diastolic dysfunction, very mild aortic stenosis  -Daily weights/intake and output, consulted Heart Failure Team.  Pt started on IV milrinone 8/18.  Pt hesitant about starting it but eventually agreed. He has done well on it and diuresed a lot more.  -Initially started Broad-spectrum empiric antibiotics with vancomycin and Zosyn with pharmacy dosing, de-escalated 8/17 to doxycycline thru 8/23 (complete course).  -blood  cultures and sputum culture NGTD -Continue supportive care with oxygen -Early mobilization with physical therapy but patient is very deconditioned and gets very SOB with ambulation.  Will discharge to SNF in 1-2 days.      HYPERTENSION, Essential -Imdur and Toprol as above, norvasc discontinued.  - blood pressure has been well controlled.    Chronic Bilateral lower extremity edema -Has skin changes initially concerning for possible cellulitis, now has improved  -Antibiotics as above -Possible component of heart failure contributing -UNNA boot in place prior to admission-consult WOC RN   CKD (chronic kidney disease) stage 3, GFR 30-59 ml/min -Follow electrolytes closely with diuresis -bump in creatinine noted but overall has been stable    Elevated troponin -Mild elevation in troponin at 0.13 which actually demonstrates a downward trend from previous admission peak of 0.81 -Patient currently denying chest pain -Evaluated by cardiology last admission and underwent nuclear medicine Myoview scan. Cardiology documented that the patient was not known to have CAD but has not had ischemic evaluation prior to last admission. The nuclear stress test was consistent with possible old infarction but no acute ischemia. EF on stress test was 38% and EF on echo was 45-50%. Patient refused cardiac catheterization during previous admission. Cardiology opted to manage his non-STEMI with aspirin, beta blocker and Plavix and we have continued    Hyperlipidemia -Continue statin    Alcoholic cirrhosis  -Apparently no longer consuming alcoholic beverages and during previous admission LFTs as well as total bilirubin were normal, but very concerned about his profound fluid weight gain since recent hospitalization, I don't believe that he can independently care for himself any longer.  I think he needs SNF but patient is very against it at this  time.  I asked for psychiatry evaluation for capacity as he is not  willing to go to SNF and I'm concerned about his safety. UPDATE: his [facility] cannot take him back and patient has agreed to go to SNF.        Aortic stenosis, mild -Asymptomatic   Early Dementia -Does not carry a formal diagnosis of dementia and uncertain if memory loss more related to prior alcoholism, short term memory getting worse -Also was not on disease modifying medications -Patient's wife died several weeks ago and son who has assumed the POA roll since patient's wife was POA -I asked psychiatry service to evaluate him for capacity for medical decision making and they reported that he can make his own medical decisions.     Iron deficiency anemia due to chronic blood loss -Hemoglobin stable and around baseline between 10.3 and 11.1   DVT prophylaxis: Lovenox  Code Status: DO NOT RESUSCITATE  Family Communication: Son, [name] - I tried to call 8/21 but unable to get answer Disposition Plan: Pt agreed to SNF, will likely dc in 1-2 days Consults called: cardiology and behavioral health psychiatry  Antimicrobials: Anti-infectives    Start     Dose/Rate Route Frequency Ordered Stop   06/25/16 1400  doxycycline (VIBRA-TABS) tablet 100 mg     100 mg Oral Every 12 hours 06/25/16 1300 07/01/16 0959   06/24/16 1000  vancomycin (VANCOCIN) IVPB 750 mg/150 ml premix  Status:  Discontinued     750 mg 150 mL/hr over 60 Minutes Intravenous Every 24 hours 06/23/16 0903 06/25/16 1300   06/23/16 1500  piperacillin-tazobactam (ZOSYN) IVPB 2.25 g  Status:  Discontinued     2.25 g 100 mL/hr over 30 Minutes Intravenous Every 6 hours 06/23/16 0934 06/23/16 0959   06/23/16 1500  piperacillin-tazobactam (ZOSYN) IVPB 3.375 g  Status:  Discontinued     3.375 g 12.5 mL/hr over 240 Minutes Intravenous Every 6 hours 06/23/16 1000 06/23/16 1006   06/23/16 1500  piperacillin-tazobactam (ZOSYN) IVPB 3.375 g  Status:  Discontinued     3.375 g 12.5 mL/hr over 240 Minutes Intravenous Every 8 hours  06/23/16 1006 06/25/16 1300   06/23/16 0845  piperacillin-tazobactam (ZOSYN) IVPB 3.375 g     3.375 g 100 mL/hr over 30 Minutes Intravenous  Once 06/23/16 0832 06/23/16 0930   06/23/16 0845  vancomycin (VANCOCIN) 1,500 mg in sodium chloride 0.9 % 500 mL IVPB     1,500 mg 250 mL/hr over 120 Minutes Intravenous  Once 06/23/16 [id number] 06/23/16 1130     Subjective: Pt says that he is really diuresing a lot.  His weight overall is down at least 12 pounds.  He still is fluid overloaded.   Objective: Vitals:   06/28/16 2053 06/29/16 0546 06/29/16 1439 06/30/16 0618  BP: (!) 133/57 (!) 120/56 (!) 122/54   Pulse: 90 81 79   Resp: 20 20 20    Temp: 98.2 F (36.8 C) 97.9 F (36.6 C) 98.1 F (36.7 C)   TempSrc: Oral Oral Oral   SpO2: 99% 97% 100%   Weight:  71.6 kg (157 lb 14.4 oz)  69.3 kg (152 lb 12.8 oz)  Height:        Intake/Output Summary (Last 24 hours) at 06/30/16 1206 Last data filed at 06/30/16 0944  Gross per 24 hour  Intake          1270.69 ml  Output              925 ml  Net           345.69 ml   Filed Weights   06/28/16 0300 06/29/16 0546 06/30/16 0618  Weight: 74.1 kg (163 lb 4.8 oz) 71.6 kg (157 lb 14.4 oz) 69.3 kg (152 lb 12.8 oz)   Exam:  General exam: awake, alert, sitting up in chair, no apparent distress.  Respiratory system: bibasilar crackles.  Cardiovascular system: S1 & S2 heard. No JVD, murmurs, gallops, clicks or pedal edema. Gastrointestinal system: Abdomen is nondistended, soft and nontender. Normal bowel sounds heard. Central nervous system: Alert and oriented. No focal neurological deficits. Extremities: 2+ edema bilateral LEs. Severe scrotal edema slightly improved.   Data Reviewed: Basic Metabolic Panel:  Recent Labs Lab 06/23/16 1242  06/26/16 0224 06/27/16 0300 06/28/16 0325 06/29/16 0554 06/30/16 0508  NA  --   < > 141 141 139 138 138  K  --   < > 3.8 3.8 3.8 3.6 3.6  CL  --   < > 109 109 104 100* 98*  CO2  --   < > 22 21* 25 27 30     GLUCOSE  --   < > 135* 132* 124* 140* 137*  BUN  --   < > 50* 57* 61* 66* 76*  CREATININE  --   < > 2.82* 2.84* 2.95* 2.76* 2.67*  CALCIUM  --   < > 8.6* 8.6* 8.8* 8.6* 8.5*  MG 1.8  --   --  1.8  --   --  1.6*  < > = values in this interval not displayed. Liver Function Tests: No results for input(s): AST, ALT, ALKPHOS, BILITOT, PROT, ALBUMIN in the last 168 hours. No results for input(s): LIPASE, AMYLASE in the last 168 hours. No results for input(s): AMMONIA in the last 168 hours. CBC:  Recent Labs Lab 06/25/16 0418 06/26/16 0224 06/27/16 0300 06/28/16 0325 06/29/16 0554  WBC 13.7* 11.5* 11.1* 10.0 9.8  HGB 10.1* 9.7* 9.6* 9.7* 9.7*  HCT 32.8* 31.3* 31.1* 31.7* 31.4*  MCV 101.2* 101.0* 99.4 99.1 99.1  PLT 295 264 245 239 208   Cardiac Enzymes: No results for input(s): CKTOTAL, CKMB, CKMBINDEX, TROPONINI in the last 168 hours. CBG (last 3)  No results for input(s): GLUCAP in the last 72 hours. Recent Results (from the past 240 hour(s))  Blood Culture (routine x 2)     Status: None   Collection Time: 06/23/16  8:28 AM  Result Value Ref Range Status   Specimen Description BLOOD RIGHT ANTECUBITAL  Final   Special Requests BOTTLES DRAWN AEROBIC AND ANAEROBIC  5CC  Final   Culture NO GROWTH 5 DAYS  Final   Report Status 06/28/2016 FINAL  Final  Urine culture     Status: None   Collection Time: 06/23/16  8:31 AM  Result Value Ref Range Status   Specimen Description URINE, RANDOM  Final   Special Requests NONE  Final   Culture NO GROWTH  Final   Report Status 06/24/2016 FINAL  Final  Blood Culture (routine x 2)     Status: None   Collection Time: 06/23/16  8:35 AM  Result Value Ref Range Status   Specimen Description BLOOD RIGHT ANTECUBITAL  Final   Special Requests BOTTLES DRAWN AEROBIC ONLY  5CC  Final   Culture NO GROWTH 5 DAYS  Final   Report Status 06/28/2016 FINAL  Final     Studies: No results found.  Scheduled Meds: . aspirin EC  81 mg Oral Daily  .  clopidogrel  75 mg Oral Daily  . doxycycline  100 mg Oral Q12H  . enoxaparin (LOVENOX) injection  30 mg Subcutaneous Q24H  . furosemide  80 mg Intravenous BID  . isosorbide mononitrate  30 mg Oral Daily  . metoprolol succinate  75 mg Oral Daily  . multivitamin with minerals  1 tablet Oral Daily  . pantoprazole  40 mg Oral BID  . simvastatin  20 mg Oral QHS  . sodium chloride flush  3 mL Intravenous Q12H   Continuous Infusions: . milrinone 0.125 mcg/kg/min (06/29/16 1812)   Principal Problem:   Acute respiratory failure with hypoxia (HCC) Active Problems:   Elevated troponin   Hyperlipidemia   HYPERTENSION, BENIGN   Hypertrophic obstructive cardiomyopathy (HCC)   Bilateral lower extremity edema   Alcoholic cirrhosis (HCC)   Aortic stenosis, mild   Memory loss   CKD (chronic kidney disease) stage 3, GFR 30-59 ml/min   Iron deficiency anemia due to chronic blood loss   Acute combined systolic and diastolic heart failure (HCC)   HCAP (healthcare-associated pneumonia)   AKI (acute kidney injury) ([place])   Venous stasis dermatitis of both lower extremities  Time spent:   Irwin Brakeman, MD, FAAFP [facility] Pager [phone]  If 7PM-7AM, please contact night-coverage [email or web address] Password [email or web address] 06/30/2016, 12:06 PM    LOS: 7 days

## 2016-07-01 DIAGNOSIS — K7030 Alcoholic cirrhosis of liver without ascites: Secondary | ICD-10-CM

## 2016-07-01 LAB — BASIC METABOLIC PANEL
ANION GAP: 9 (ref 5–15)
BUN: 81 mg/dL — ABNORMAL HIGH (ref 6–20)
CALCIUM: 8.5 mg/dL — AB (ref 8.9–10.3)
CO2: 32 mmol/L (ref 22–32)
Chloride: 97 mmol/L — ABNORMAL LOW (ref 101–111)
Creatinine, Ser: 2.72 mg/dL — ABNORMAL HIGH (ref 0.61–1.24)
GFR calc Af Amer: 23 mL/min — ABNORMAL LOW (ref >60)
GFR calc non Af Amer: 20 mL/min — ABNORMAL LOW (ref >60)
GLUCOSE: 125 mg/dL — AB (ref 65–99)
Potassium: 3.4 mmol/L — ABNORMAL LOW (ref 3.5–5.1)
Sodium: 138 mmol/L (ref 135–145)

## 2016-07-01 MED ORDER — POTASSIUM CHLORIDE CRYS ER 20 MEQ PO TBCR
20.0000 meq | EXTENDED_RELEASE_TABLET | Freq: Once | ORAL | Status: AC
  Administered 2016-07-01: 20 meq via ORAL
  Filled 2016-07-01: qty 1

## 2016-07-01 NOTE — Progress Notes (Signed)
PROGRESS NOTE        PATIENT DETAILS Name: [name] Age: 82 y.o. 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 Started on antibiotics with improvement.Cardiology consulted, and started on milrinone infusion, volume status is slowly improving  Subjective: Feels overall better, less shortness of breath.  Assessment/Plan: Principal Problem:  Acute respiratory failure with hypoxia: Multifactorial, secondary to acute systolic heart failure and healthcare associated pneumonia. Has completed a course of antibiotics, cardiology managing diuretics and milrinone infusion. Hypoxia has improved, plans are to titrate down oxygen to room air.  Active Problems: Acute on chronic systolic heart failure (EF 45-50% on echo on 06/15/16): Improving, weight down 251 pounds (176 pounds on admission). Negative balance of 1.6 L so far, not sure whether this is accurate. CHF team following, and directing milrinone infusion and diuretics  Healthcare associated pneumonia: Leukocytosis has resolved, patient has completed a course of antimicrobial therapy. Blood cultures, streptococcal antigen negative.  Acute on chronic kidney disease stage III: ARF likely secondary to cardiorenal syndrome-on milrinone infusion-creatinine seems to have plateaued. Follow.  Dyslipidemia: Continue statin  History of Alcoholic cirrhosis: No longer consuming alcoholic beverages-diuretics on hold due to worsening renal function-resume when able.  CAD: Recently with non-STEMI-recent nuclear stress test on 8/5 without ischemia. Note-patient had refused cardiac catheterization  then. Continue aspirin, Plavix, metoprolol and statin. Without any chest pain.  ? Dementia/early cognitive dysfunction: Seen by psychiatry on 8/21-felt to have capacity to make decisions. Defer further workup for possible early cognitive dysfunction/dementia to the outpatient setting.   Venous stasis dermatitis of both lower extremities:UNNA boot in place -wound care following  DVT Prophylaxis: Prophylactic Lovenox   Code Status: Full code or DNR  Family Communication: None at bedside-we will recheck her to family or the next few days  Disposition Plan: Remain inpatient- SNF on discharge in the next 1-2 days  Antimicrobial agents: Doxycycline 8/17>> 8/22 IV vancomycin 8/15>> 8/17 IV Zosyn 8/15>> 8/17  Procedures: Echo 06/15/16 LVEF 45-50% with Grade 1 DD. Mild AS + AI  CONSULTS:  cardiology  Time spent: 25- minutes-Greater than 50% of this time was spent in counseling, explanation of diagnosis, planning of further management, and coordination of care.  MEDICATIONS: Anti-infectives    Start     Dose/Rate Route Frequency Ordered Stop   06/25/16 1400  doxycycline (VIBRA-TABS) tablet 100 mg     100 mg Oral Every 12 hours 06/25/16 1300 06/30/16 2134   06/24/16 1000  vancomycin (VANCOCIN) IVPB 750 mg/150 ml premix  Status:  Discontinued     750 mg 150 mL/hr over 60 Minutes Intravenous Every 24 hours 06/23/16 0903 06/25/16 1300   06/23/16 1500  piperacillin-tazobactam (ZOSYN) IVPB 2.25 g  Status:  Discontinued     2.25 g 100 mL/hr over 30 Minutes Intravenous Every 6 hours 06/23/16 0934 06/23/16 0959   06/23/16 1500  piperacillin-tazobactam (ZOSYN) IVPB 3.375 g  Status:  Discontinued     3.375 g 12.5 mL/hr over 240 Minutes Intravenous Every 6 hours 06/23/16 1000 06/23/16 1006   06/23/16 1500  piperacillin-tazobactam (ZOSYN) IVPB 3.375 g  Status:  Discontinued     3.375 g 12.5 mL/hr over 240 Minutes Intravenous Every 8 hours 06/23/16 1006 06/25/16 1300   06/23/16 0845   piperacillin-tazobactam (ZOSYN) IVPB 3.375 g     3.375 g 100 mL/hr over 30 Minutes Intravenous  Once 06/23/16 0832 06/23/16 0930   06/23/16 0845  vancomycin (VANCOCIN) 1,500 mg in sodium chloride 0.9 % 500 mL IVPB     1,500 mg 250 mL/hr over 120 Minutes Intravenous  Once 06/23/16 0838 06/23/16 1130      Scheduled Meds: . aspirin EC  81 mg Oral Daily  . clopidogrel  75 mg Oral Daily  . enoxaparin (LOVENOX) injection  30 mg Subcutaneous Q24H  . isosorbide mononitrate  30 mg Oral Daily  . metoprolol succinate  75 mg Oral Daily  . multivitamin with minerals  1 tablet Oral Daily  . pantoprazole  40 mg Oral BID  . simvastatin  20 mg Oral QHS  . sodium chloride flush  3 mL Intravenous Q12H   Continuous Infusions:  PRN Meds:.sodium chloride, acetaminophen, ALPRAZolam, ondansetron (ZOFRAN) IV, sodium chloride flush, zolpidem   PHYSICAL EXAM: Vital signs: Vitals:   06/30/16 0618 06/30/16 2027 07/01/16 0614 07/01/16 1220  BP:  (!) 117/51 (!) 124/50 (!) 114/52  Pulse:  86 75 71  Resp:  16 18 20   Temp:  99.3 F (37.4 C) 98.1 F (36.7 C) 97.7 F (36.5 C)  TempSrc:  Oral Oral Oral  SpO2:  98% 98% 98%  Weight: 69.3 kg (152 lb 12.8 oz)  68.9 kg (151 lb 14.4 oz)   Height:       Filed Weights   06/29/16 0546 06/30/16 0618 07/01/16 0614  Weight: 71.6 kg (157 lb 14.4 oz) 69.3 kg (152 lb 12.8 oz) 68.9 kg (151 lb 14.4 oz)   Body mass index is 23.1 kg/m.   Gen Exam: Awake and alert with clear speech. Not in any distress  Neck: Supple, No JVD.   Chest: B/L Clear.   CVS: S1 S2 Regular, 3/6 systolic murmur  Abdomen: soft, BS +, non tender, non distended.  Extremities: Unna boots in place Neurologic: Non Focal.   Skin: No Rash or lesions   Wounds: N/A.   I have personally reviewed following labs and imaging studies  LABORATORY DATA: CBC:  Recent Labs Lab 06/25/16 0418 06/26/16 0224 06/27/16 0300 06/28/16 0325 06/29/16 0554  WBC 13.7* 11.5* 11.1* 10.0 9.8  HGB 10.1* 9.7* 9.6*  9.7* 9.7*  HCT 32.8* 31.3* 31.1* 31.7* 31.4*  MCV 101.2* 101.0* 99.4 99.1 99.1  PLT 295 264 245 239 [id number]    Basic Metabolic Panel:  Recent Labs Lab 06/27/16 0300 06/28/16 0325 06/29/16 0554 06/30/16 0508 07/01/16 0423  NA 141 139 138 138 138  K 3.8 3.8 3.6 3.6 3.4*  CL 109 104 100* 98* 97*  CO2 21* 25 27 30  32  GLUCOSE 132* 124* 140* 137* 125*  BUN 57* 61* 66* 76* 81*  CREATININE 2.84* 2.95* 2.76* 2.67* 2.72*  CALCIUM 8.6* 8.8* 8.6* 8.5* 8.5*  MG 1.8  --   --  1.6*  --     GFR: Estimated Creatinine Clearance: 19.2 mL/min (by C-G formula based on SCr of 2.72 mg/dL).  Liver Function Tests: No results for input(s): AST, ALT, ALKPHOS, BILITOT, PROT, ALBUMIN in the last 168 hours. No results for input(s): LIPASE, AMYLASE in the last 168 hours. No results for input(s): AMMONIA in the last 168 hours.  Coagulation Profile: No results for input(s): INR, PROTIME in the  last 168 hours.  Cardiac Enzymes: No results for input(s): CKTOTAL, CKMB, CKMBINDEX, TROPONINI in the last 168 hours.  BNP (last 3 results)  Recent Labs  07/22/15 1536  PROBNP 61.0    HbA1C: No results for input(s): HGBA1C in the last 72 hours.  CBG: No results for input(s): GLUCAP in the last 168 hours.  Lipid Profile: No results for input(s): CHOL, HDL, LDLCALC, TRIG, CHOLHDL, LDLDIRECT in the last 72 hours.  Thyroid Function Tests: No results for input(s): TSH, T4TOTAL, FREET4, T3FREE, THYROIDAB in the last 72 hours.  Anemia Panel: No results for input(s): VITAMINB12, FOLATE, FERRITIN, TIBC, IRON, RETICCTPCT in the last 72 hours.  Urine analysis:    Component Value Date/Time   COLORURINE YELLOW 06/23/2016 0831   APPEARANCEUR CLEAR 06/23/2016 0831   LABSPEC 1.023 06/23/2016 0831   PHURINE 5.0 06/23/2016 0831   GLUCOSEU NEGATIVE 06/23/2016 0831   GLUCOSEU NEGATIVE 07/17/2015 1148   HGBUR NEGATIVE 06/23/2016 0831   BILIRUBINUR NEGATIVE 06/23/2016 0831   KETONESUR NEGATIVE 06/23/2016 0831     PROTEINUR 100 (A) 06/23/2016 0831   UROBILINOGEN 0.2 07/17/2015 1148   NITRITE NEGATIVE 06/23/2016 0831   LEUKOCYTESUR NEGATIVE 06/23/2016 0831    Sepsis Labs: Lactic Acid, Venous    Component Value Date/Time   LATICACIDVEN 0.59 06/23/2016 0845    MICROBIOLOGY: Recent Results (from the past 240 hour(s))  Blood Culture (routine x 2)     Status: None   Collection Time: 06/23/16  8:28 AM  Result Value Ref Range Status   Specimen Description BLOOD RIGHT ANTECUBITAL  Final   Special Requests BOTTLES DRAWN AEROBIC AND ANAEROBIC  5CC  Final   Culture NO GROWTH 5 DAYS  Final   Report Status 06/28/2016 FINAL  Final  Urine culture     Status: None   Collection Time: 06/23/16  8:31 AM  Result Value Ref Range Status   Specimen Description URINE, RANDOM  Final   Special Requests NONE  Final   Culture NO GROWTH  Final   Report Status 06/24/2016 FINAL  Final  Blood Culture (routine x 2)     Status: None   Collection Time: 06/23/16  8:35 AM  Result Value Ref Range Status   Specimen Description BLOOD RIGHT ANTECUBITAL  Final   Special Requests BOTTLES DRAWN AEROBIC ONLY  5CC  Final   Culture NO GROWTH 5 DAYS  Final   Report Status 06/28/2016 FINAL  Final    RADIOLOGY STUDIES/RESULTS: Dg Chest 2 View  Result Date: 06/24/2016 CLINICAL DATA:  Acute CHF, shortness of breath, cardiomyopathy, cirrhosis, chronic renal insufficiency EXAM: CHEST  2 VIEW COMPARISON:  PA and lateral chest x-ray of June 23, 2016 FINDINGS: There remains volume loss on the right with elevation of the hemidiaphragm. The left lung is adequately inflated. Coarse interstitial markings are present in the lower left lung. There is partial obscuration of the hemidiaphragm. The cardiac silhouette is enlarged. The pulmonary vascularity is mildly prominent centrally. IMPRESSION: Allowing for differences in positioning there has not been significant interval change since yesterday's study. Persistent left basilar atelectasis or  pneumonia with trace left pleural effusion. Stable cardiomegaly. Electronically Signed   By: David  Martinique M.D.   On: 06/24/2016 07:52   Dg Chest 2 View  Result Date: 06/23/2016 CLINICAL DATA:  Shortness of Breath EXAM: CHEST  2 VIEW COMPARISON:  June 12, 2016 FINDINGS: There remains airspace consolidation throughout much of the left lower lobe with small left effusion. There is stable elevation of the right hemidiaphragm.  There is underlying mild chronic interstitial prominence without frank edema. Heart size is within normal limits. The pulmonary vascularity is normal. No adenopathy. There is degenerative change in the thoracic spine. IMPRESSION: Persistent left lower lobe airspace consolidation small left effusion. Mild underlying interstitial prominence, probably representing chronic inflammatory type change. No new opacity. Stable cardiac silhouette. Stable elevation of the right hemidiaphragm. Electronically Signed   By: Lowella Grip III M.D.   On: 06/23/2016 07:43   Dg Chest 2 View  Result Date: 06/12/2016 CLINICAL DATA:  83 year old male with chest pain and shortness of breath EXAM: CHEST  2 VIEW COMPARISON:  Chest radiograph dated 05/28/2016 FINDINGS: Shallow inspiration. There is eventration of the right hemidiaphragm with atelectatic changes of the right lung base. Patchy area of hazy density at the left lung base may represent atelectasis versus infiltrate. There is no pneumothorax. Stable enlarged cardiac silhouette. No acute osseous pathology. IMPRESSION: Patchy left lung base airspace opacity, atelectasis versus infiltrate. Clinical correlation and follow-up recommended. Electronically Signed   By: Anner Crete M.D.   On: 06/12/2016 06:47   Nm Myocar Multi W/spect W/wall Motion / Ef  Result Date: 06/13/2016 CLINICAL DATA:  82 year old male with chest pain hypertension was short of breath. EXAM: MYOCARDIAL IMAGING WITH SPECT (REST AND PHARMACOLOGIC-STRESS) GATED LEFT VENTRICULAR  WALL MOTION STUDY LEFT VENTRICULAR EJECTION FRACTION TECHNIQUE: Standard myocardial SPECT imaging was performed after resting intravenous injection of 10 mCi Tc-87m tetrofosmin. Subsequently, intravenous infusion of Lexiscan was performed under the supervision of the Cardiology staff. At peak effect of the drug, 30 mCi Tc-70m tetrofosmin was injected intravenously and standard myocardial SPECT imaging was performed. Quantitative gated imaging was also performed to evaluate left ventricular wall motion, and estimate left ventricular ejection fraction. COMPARISON:  None. FINDINGS: Perfusion: There is a large region of decreased profusion involving the apex, anterior, mid, and basilar segments of the inferior wall, septal wall and the anterior wall. This large defect is matched on rest and stress. Preserved profusion to the lateral wall. Wall Motion:  Septal dyskinesia. Left Ventricular Ejection Fraction: 38 % End diastolic volume 123XX123 ml End systolic volume 94 ml IMPRESSION: 1. Large region of infarcted myocardium involving the apex, inferior wall, septal wall and anterior wall. No reversible ischemia. 2. Septal dyskinesia. 3. Left ventricular ejection fraction 38% 4. Non invasive risk stratification*: Intermediate owing to LEFT ventricular dysfunction. *2012 Appropriate Use Criteria for Coronary Revascularization Focused Update: J Am Coll Cardiol. [id number]. http://content.airportbarriers.com.aspx?articleid=[email or web address] Electronically Signed   By: Suzy Bouchard M.D.   On: 06/13/2016 13:57   Dg Chest Port 1 View  Result Date: 06/27/2016 CLINICAL DATA:  HCAP (healthcare associated pna) CHF, CHF exacerbation. No complaints per pt this am as pt was just waking up and said he couldn't tell how he felt just yet. EXAM: PORTABLE CHEST 1 VIEW COMPARISON:  06/24/2016 FINDINGS: Patchy bilateral airspace lung opacities are without substantial change from the prior exam allowing for differences in patient  positioning. Elevation right hemidiaphragm is stable. There are probable small pleural effusions. No pneumothorax. Cardiac silhouette is partly obscured but grossly normal in size. IMPRESSION: 1. No significant change in the bilateral lower lung zone patchy airspace opacities. This may reflect multifocal pneumonia or asymmetric pulmonary edema. Electronically Signed   By: Lajean Manes M.D.   On: 06/27/2016 07:31     LOS: 8 days   Oren Binet, MD  [facility] Pager:[phone]  If 7PM-7AM, please contact night-coverage [email or web address] Password [facility] 07/01/2016, 3:03 PM

## 2016-07-01 NOTE — Progress Notes (Signed)
Patient ID: [name], male   DOB: 05-12-1929, 82 y.o.   MRN: [id number]    Advanced Heart Failure Team Progress Note  Referring Physician: Dr. Murvin Natal Primary Physician: [name], MD Primary Cardiologist:  Dr. Angelena Form  Reason for Consultation: A/C combined CHF  HPI:    Creatinine elevated but stable. 2.94 -> 2.76 -> 2.67 -> 2.72. BUN 66 -> 76 -> 81.   Feeling OK today.  Denies SOB or CP. No lightheadedness or dizziness. Asks when he can go home.   Out 100 cc (inaccurate) and down another 2 lbs. (24 lbs overall)  Scheduled Meds: . aspirin EC  81 mg Oral Daily  . clopidogrel  75 mg Oral Daily  . enoxaparin (LOVENOX) injection  30 mg Subcutaneous Q24H  . isosorbide mononitrate  30 mg Oral Daily  . metoprolol succinate  75 mg Oral Daily  . multivitamin with minerals  1 tablet Oral Daily  . pantoprazole  40 mg Oral BID  . potassium chloride  20 mEq Oral Once  . simvastatin  20 mg Oral QHS  . sodium chloride flush  3 mL Intravenous Q12H   Continuous Infusions: . milrinone 0.125 mcg/kg/min (07/01/16 0623)   PRN Meds:.sodium chloride, acetaminophen, ALPRAZolam, ondansetron (ZOFRAN) IV, sodium chloride flush, zolpidem    Past Medical History: Past Medical History:  Diagnosis Date  . Acute combined systolic and diastolic CHF, NYHA class 1 ([place])   . Acute respiratory failure ([place])   . Alcoholic cirrhosis ([place])   . 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 Alcoholism ([place])   . Aortic stenosis   . Ascites 11/2004   "early" SBP on paracentesis.   . Chronic gastritis 05/01/2016  . CKD (chronic kidney disease), stage III 10/11/2015  . Colon polyp perhaps in 1990s   pathology/type not known  . Diverticulosis of colon perhaps in 1990s  . Fracture of wrist 2014 spring vs early summer   refused orthopods suggestion to set the bone.   Marland Kitchen GERD (gastroesophageal reflux disease)   . HLD (hyperlipidemia)   . HTN (hypertension)   . Hypertrophic cardiomyopathy ([place])   .  Portal hypertensive gastropathy 2006  . Renal insufficiency   . Rhabdomyolysis 01/2005    Past Surgical History: Past Surgical History:  Procedure Laterality Date  . BASAL CELL CARCINOMA EXCISION Left 11/2012   lower eyelid  . ESOPHAGOGASTRODUODENOSCOPY N/A 08/04/2013   Procedure: ESOPHAGOGASTRODUODENOSCOPY (EGD);  Surgeon: Ladene Artist, MD;  Location[facility] ENDOSCOPY;  Service: Endoscopy;  Laterality: N/A;  . ESOPHAGOGASTRODUODENOSCOPY (EGD) WITH PROPOFOL N/A 05/01/2016   Procedure: ESOPHAGOGASTRODUODENOSCOPY (EGD) WITH PROPOFOL;  Surgeon: Jerene Bears, MD;  Location: [facility] ENDOSCOPY;  Service: Endoscopy;  Laterality: N/A;  . INGUINAL HERNIA REPAIR    . MELANOMA EXCISION Left 02/2011   malignant melanoma of thigh    Family History: Family History  Problem Relation Age of Onset  . Cancer Father 43    lung cancer  . Heart disease Mother   . Heart disease Sister     Social History: Social History   Social History  . Marital status: Married    Spouse name: N/A  . Number of children: 4  . Years of education: 24   Occupational History  . pathologist Retired    retired   Social History Main Topics  . Smoking status: Never Smoker  . Smokeless tobacco: Never Used  . Alcohol use No     Comment: member of AA  . Drug use: No  . Sexual activity:  Not Asked   Other Topics Concern  . 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  Remains independent. ACP- not discussed, will approach at next OV    Allergies:  No Known Allergies  Objective:    Vital Signs:   Temp:  [98.1 F (36.7 C)-99.3 F (37.4 C)] 98.1 F (36.7 C) (08/23 0614) Pulse Rate:  [75-86] 75 (08/23 0614) Resp:  [16-18] 18 (08/23 0614) BP: (117-124)/(50-51) 124/50 (08/23 0614) SpO2:  [98 %] 98 % (08/23 0614) Weight:  [151 lb 14.4 oz (68.9 kg)] 151 lb 14.4 oz (68.9 kg) (08/23 0614) Last BM Date: 07/01/16  Weight change: Filed Weights   06/29/16 0546 06/30/16 0618  07/01/16 0614  Weight: 157 lb 14.4 oz (71.6 kg) 152 lb 12.8 oz (69.3 kg) 151 lb 14.4 oz (68.9 kg)    Intake/Output:   Intake/Output Summary (Last 24 hours) at 07/01/16 0820 Last data filed at 07/01/16 0300  Gross per 24 hour  Intake              600 ml  Output             1234 ml  Net             -634 ml     Physical Exam: General:  Elderly appearing.  HEENT: normal Neck: supple. JVP 9-10 cm. Carotids 2+ bilat; no bruits. No thyromegaly or nodule noted.  Cor: PMI nondisplaced. RRR. No rubs, gallops. 2/6 AS murmur. Lungs:Diminished basilar sounds with mild crackles. Abdomen: soft, NT, ND, no HSM. No bruits or masses. +BS  Extremities: no cyanosis, clubbing, rash. 1+ thigh edema, LEs actually with a lot less edema. Mild penile and scrotal edema persists. Bilateral UNNA boots remain.  Neuro: alert & orientedx3, cranial nerves grossly intact. moves all 4 extremities w/o difficulty. Affect pleasant  Telemetry: Reviewed, NSR  Labs: Basic Metabolic Panel:  Recent Labs Lab 06/27/16 0300 06/28/16 0325 06/29/16 0554 06/30/16 0508 07/01/16 0423  NA 141 139 138 138 138  K 3.8 3.8 3.6 3.6 3.4*  CL 109 104 100* 98* 97*  CO2 21* 25 27 30  32  GLUCOSE 132* 124* 140* 137* 125*  BUN 57* 61* 66* 76* 81*  CREATININE 2.84* 2.95* 2.76* 2.67* 2.72*  CALCIUM 8.6* 8.8* 8.6* 8.5* 8.5*  MG 1.8  --   --  1.6*  --     Liver Function Tests: No results for input(s): AST, ALT, ALKPHOS, BILITOT, PROT, ALBUMIN in the last 168 hours. No results for input(s): LIPASE, AMYLASE in the last 168 hours. No results for input(s): AMMONIA in the last 168 hours.  CBC:  Recent Labs Lab 06/25/16 0418 06/26/16 0224 06/27/16 0300 06/28/16 0325 06/29/16 0554  WBC 13.7* 11.5* 11.1* 10.0 9.8  HGB 10.1* 9.7* 9.6* 9.7* 9.7*  HCT 32.8* 31.3* 31.1* 31.7* 31.4*  MCV 101.2* 101.0* 99.4 99.1 99.1  PLT 295 264 245 239 208    Cardiac Enzymes: No results for input(s): CKTOTAL, CKMB, CKMBINDEX, TROPONINI in the  last 168 hours.  BNP: BNP (last 3 results)  Recent Labs  04/13/16 1046 06/12/16 0556 06/23/16 0705  BNP 107.2* 637.8* 1,426.3*    ProBNP (last 3 results)  Recent Labs  07/22/15 1536  PROBNP 61.0     CBG: No results for input(s): GLUCAP in the last 168 hours.  Coagulation Studies: No results for input(s): LABPROT, INR in the last 72 hours.  Other results:   Imaging: No results found.  Medications:     Current Medications: . aspirin EC  81 mg Oral Daily  . clopidogrel  75 mg Oral Daily  . enoxaparin (LOVENOX) injection  30 mg Subcutaneous Q24H  . isosorbide mononitrate  30 mg Oral Daily  . metoprolol succinate  75 mg Oral Daily  . multivitamin with minerals  1 tablet Oral Daily  . pantoprazole  40 mg Oral BID  . potassium chloride  20 mEq Oral Once  . simvastatin  20 mg Oral QHS  . sodium chloride flush  3 mL Intravenous Q12H    Infusions: . milrinone 0.125 mcg/kg/min (07/01/16 [id number])     Assessment   1. Acute on chronic combined CHF, ICM. EF ~40-45% with mid to distal anterior, anteroseptal and apical HK 2. HCAP 3. CAD with previous silent LAD infarct 4. Aortic Stenosis, Mild 5. Alcoholic Cirrhosis 6. AKI on CKD stage 3 7. RBBB   Plan   Suspect low output physiology with increased response on milrinone. Creatinine elevated but baseline but relatively stable. BUN continues to rise.    Continue milrinone 0.125 mcg/kg/min for this am and stop this afternoon. Weight down 24 lbs overall.     His volume status is improved, but he still has a good amount of peripheral edema.  With AKI likely mixed cardiorenal picture and chronic venous stasis.   Will hold diuretics today and continue milrinone until noon for increased renal perfusion.   Will plan on likely starting po torsemide in am. Would prefer to watch overnight once on po regimen. Pt states he is willing to discuss in am. He will need close follow up.   Echo 06/15/16 LVEF 45-50% with Grade 1 DD.  Mild AS + AI. EF decreased from 60-65% in June. Likely ischemic with recent NSTEMI, although patient refused catheterization.  DNR/DNI.    BCx and UCx remain negative to date. Remains on Doxycycline per primary team.   Dispo: To SNF later this week.   Length of Stay: [place], [place] 07/01/2016, 8:20 AM  Advanced Heart Failure Team Pager [phone] (M-F; 7a - 4p)  Please contact [facility] for night-coverage after hours (4p -7a ) and weekends on [email or web address]   Patient seen and examined with Oda Kilts, PA-C. We discussed all aspects of the encounter. I agree with the assessment and plan as stated above.   Volume status much improved. Renal function overall stable. This is as probably as good as we can get him. Stop milrinone. Switch to po diuretics. Hopefully transfer to SNF @ [facility] in am  Bensimhon, Daniel,MD 11:49 PM

## 2016-07-01 NOTE — Clinical Social Work Note (Signed)
CSW called Butch Penny at [facility] to notify her that patient would likely discharge tomorrow. She will notify [place].  Dayton Scrape, [place]

## 2016-07-02 DIAGNOSIS — J189 Pneumonia, unspecified organism: Secondary | ICD-10-CM

## 2016-07-02 LAB — BASIC METABOLIC PANEL
ANION GAP: 9 (ref 5–15)
BUN: 80 mg/dL — ABNORMAL HIGH (ref 6–20)
CALCIUM: 8.8 mg/dL — AB (ref 8.9–10.3)
CO2: 32 mmol/L (ref 22–32)
CREATININE: 2.36 mg/dL — AB (ref 0.61–1.24)
Chloride: 97 mmol/L — ABNORMAL LOW (ref 101–111)
GFR, EST AFRICAN AMERICAN: 27 mL/min — AB (ref >60)
GFR, EST NON AFRICAN AMERICAN: 24 mL/min — AB (ref >60)
GLUCOSE: 107 mg/dL — AB (ref 65–99)
Potassium: 3.7 mmol/L (ref 3.5–5.1)
Sodium: 138 mmol/L (ref 135–145)

## 2016-07-02 LAB — MAGNESIUM: Magnesium: 1.9 mg/dL (ref 1.7–2.4)

## 2016-07-02 MED ORDER — TORSEMIDE 20 MG PO TABS: 40.0000 mg | ORAL_TABLET | Freq: Two times a day (BID) | ORAL | Status: DC

## 2016-07-02 MED ORDER — ZOLPIDEM TARTRATE 5 MG PO TABS: 5.0000 mg | ORAL_TABLET | Freq: Every evening | ORAL | 0 refills | Status: DC | PRN

## 2016-07-02 MED ORDER — ALPRAZOLAM 0.25 MG PO TABS: 0.2500 mg | ORAL_TABLET | Freq: Every evening | ORAL | 0 refills | Status: DC | PRN

## 2016-07-02 MED ORDER — TORSEMIDE 20 MG PO TABS
40.0000 mg | ORAL_TABLET | Freq: Two times a day (BID) | ORAL | Status: DC
  Administered 2016-07-02: 40 mg via ORAL
  Filled 2016-07-02: qty 2

## 2016-07-02 MED ORDER — POTASSIUM CHLORIDE ER 20 MEQ PO TBCR: 20.0000 meq | EXTENDED_RELEASE_TABLET | Freq: Two times a day (BID) | ORAL | Status: DC

## 2016-07-02 NOTE — Progress Notes (Signed)
Pt c/o right knee pain, 4/10, requesting Tylenol; RN notified

## 2016-07-02 NOTE — Care Management Important Message (Signed)
Important Message  Patient Details  Name: [name] MRN: [id number] Date of Birth: 1929-04-19   Medicare Important Message Given:  Yes    Olivene Cookston [address] Circle 07/02/2016, 10:56 AM

## 2016-07-02 NOTE — Progress Notes (Signed)
Orthopedic Tech Progress Note Patient Details:  [name] 08-19-1929 [id number]  Ortho Devices Type of Ortho Device: Louretta Parma boot Ortho Device/Splint Location: bilateral Ortho Device/Splint Interventions: Application   Lorren Rossetti 07/02/2016, 1:51 PM

## 2016-07-02 NOTE — Clinical Social Work Note (Signed)
CSW facilitated patient discharge including contacting patient family [name]) and facility to confirm patient discharge plans. Clinical information faxed to facility and family agreeable with plan. CSW arranged ambulance transport via [facility] to [place] around 1:00 pm. RN to call report prior to discharge [phone]).  CSW will sign off for now as social work intervention is no longer needed. Please consult [place] again if new needs arise.  Dayton Scrape, [place]

## 2016-07-02 NOTE — Progress Notes (Signed)
PT Cancellation Note  Patient Details Name: [name] MRN: [id number] DOB: Jul 31, 1929   Cancelled Treatment:    Reason Eval/Treat Not Completed: Other (comment) (Awaiting transfer to SNF. Pt declining Rx this AM.)    [name] 07/02/2016, 10:53 AM

## 2016-07-02 NOTE — Progress Notes (Signed)
Pt slept on and off overnight, was anxious in every small events, refused to take xanax but took tylenol twice for the comfort and knee pain. Breathing well without any exertion, no any sign of dyspnea and SOB noted, vitals stable, will continue to monitor the patient.

## 2016-07-02 NOTE — Progress Notes (Signed)
Patient ID: [name], male   DOB: [birth year], 82 y.o.   MRN: [id number]    Advanced Heart Failure Team Progress Note  Referring Physician: Dr. Murvin Natal Primary Physician: [name], MD Primary Cardiologist:  Dr. Angelena Form  Reason for Consultation: A/C combined CHF  HPI:    Creatinine down off diuretics yesterday. 2.94 -> 2.76 -> 2.67 -> 2.72 -> 2.3.  BUN 66 -> 76 -> 81 -> 80  Feels much better since admission. Anxious to go home.  Denies SOB or CP.   OK urine output off lasix and milrinone. Weight stable overnight. Down 24 lbs overall.  Scheduled Meds: . aspirin EC  81 mg Oral Daily  . clopidogrel  75 mg Oral Daily  . enoxaparin (LOVENOX) injection  30 mg Subcutaneous Q24H  . isosorbide mononitrate  30 mg Oral Daily  . metoprolol succinate  75 mg Oral Daily  . multivitamin with minerals  1 tablet Oral Daily  . pantoprazole  40 mg Oral BID  . simvastatin  20 mg Oral QHS  . sodium chloride flush  3 mL Intravenous Q12H   Continuous Infusions:   PRN Meds:.sodium chloride, acetaminophen, ALPRAZolam, ondansetron (ZOFRAN) IV, sodium chloride flush, zolpidem    Past Medical History: Past Medical History:  Diagnosis Date  . Acute combined systolic and diastolic CHF, NYHA class 1 ([place])   . Acute respiratory failure ([place])   . Alcoholic cirrhosis ([place])   . 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 Alcoholism ([place])   . Aortic stenosis   . Ascites 11/2004   "early" SBP on paracentesis.   . Chronic gastritis 05/01/2016  . CKD (chronic kidney disease), stage III 10/11/2015  . Colon polyp perhaps in 1990s   pathology/type not known  . Diverticulosis of colon perhaps in 1990s  . Fracture of wrist 2014 spring vs early summer   refused orthopods suggestion to set the bone.   Marland Kitchen GERD (gastroesophageal reflux disease)   . HLD (hyperlipidemia)   . HTN (hypertension)   . Hypertrophic cardiomyopathy ([place])   . Portal hypertensive gastropathy 2006  . Renal insufficiency   .  Rhabdomyolysis 01/2005    Past Surgical History: Past Surgical History:  Procedure Laterality Date  . BASAL CELL CARCINOMA EXCISION Left 11/2012   lower eyelid  . ESOPHAGOGASTRODUODENOSCOPY N/A 08/04/2013   Procedure: ESOPHAGOGASTRODUODENOSCOPY (EGD);  Surgeon: Ladene Artist, MD;  Location[facility] ENDOSCOPY;  Service: Endoscopy;  Laterality: N/A;  . ESOPHAGOGASTRODUODENOSCOPY (EGD) WITH PROPOFOL N/A 05/01/2016   Procedure: ESOPHAGOGASTRODUODENOSCOPY (EGD) WITH PROPOFOL;  Surgeon: Jerene Bears, MD;  Location: [facility] ENDOSCOPY;  Service: Endoscopy;  Laterality: N/A;  . INGUINAL HERNIA REPAIR    . MELANOMA EXCISION Left 02/2011   malignant melanoma of thigh    Family History: Family History  Problem Relation Age of Onset  . Cancer Father 53    lung cancer  . Heart disease Mother   . Heart disease Sister     Social History: Social History   Social History  . Marital status: Married    Spouse name: N/A  . Number of children: 4  . Years of education: 24   Occupational History  . pathologist Retired    retired   Social History Main Topics  . Smoking status: Never Smoker  . Smokeless tobacco: Never Used  . Alcohol use No     Comment: member of AA  . Drug use: No  . Sexual activity: Not Asked   Other Topics Concern  . None  Social History Narrative   Retired Industrial/product designer. Lives with his wife. 4 children.  Remains independent. ACP- not discussed, will approach at next OV    Allergies:  No Known Allergies  Objective:    Vital Signs:   Temp:  [97.7 F (36.5 C)-99.3 F (37.4 C)] 98.4 F (36.9 C) (08/24 0350) Pulse Rate:  [66-75] 66 (08/24 0350) Resp:  [20] 20 (08/24 0350) BP: (114-128)/(51-52) 125/51 (08/24 0350) SpO2:  [98 %-100 %] 100 % (08/24 0350) Weight:  [151 lb 9.6 oz (68.8 kg)] 151 lb 9.6 oz (68.8 kg) (08/24 0350) Last BM Date: 07/01/16  Weight change: Filed Weights   06/30/16 0618 07/01/16 0614 07/02/16 0350  Weight: 152 lb 12.8 oz (69.3 kg) 151 lb 14.4 oz  (68.9 kg) 151 lb 9.6 oz (68.8 kg)    Intake/Output:   Intake/Output Summary (Last 24 hours) at 07/02/16 0732 Last data filed at 07/02/16 0548  Gross per 24 hour  Intake              720 ml  Output              900 ml  Net             -180 ml     Physical Exam: General:  Elderly appearing.  HEENT: normal Neck: supple. JVP 8-9 cm. Carotids 2+ bilat; no bruits. No thyromegaly or nodule noted.  Cor: PMI nondisplaced. RRR. No rubs, gallops. 2/6 AS murmur. Lungs:Diminished basilar sounds with mild crackles. Abdomen: soft, NT, ND, no HSM. No bruits or masses. +BS  Extremities: no cyanosis, clubbing, rash. 1+ chronic edema up to thighs.  UNNA boots in place. Mild penile and scrotal edema persists. Neuro: alert & orientedx3, cranial nerves grossly intact. moves all 4 extremities w/o difficulty. Affect pleasant  Telemetry: Reviewed personally, NSR. 17 beats of NSVT  Labs: Basic Metabolic Panel:  Recent Labs Lab 06/27/16 0300 06/28/16 0325 06/29/16 0554 06/30/16 0508 07/01/16 0423 07/02/16 0542  NA 141 139 138 138 138 138  K 3.8 3.8 3.6 3.6 3.4* 3.7  CL 109 104 100* 98* 97* 97*  CO2 21* 25 27 30  32 32  GLUCOSE 132* 124* 140* 137* 125* 107*  BUN 57* 61* 66* 76* 81* 80*  CREATININE 2.84* 2.95* 2.76* 2.67* 2.72* 2.36*  CALCIUM 8.6* 8.8* 8.6* 8.5* 8.5* 8.8*  MG 1.8  --   --  1.6*  --   --     Liver Function Tests: No results for input(s): AST, ALT, ALKPHOS, BILITOT, PROT, ALBUMIN in the last 168 hours. No results for input(s): LIPASE, AMYLASE in the last 168 hours. No results for input(s): AMMONIA in the last 168 hours.  CBC:  Recent Labs Lab 06/26/16 0224 06/27/16 0300 06/28/16 0325 06/29/16 0554  WBC 11.5* 11.1* 10.0 9.8  HGB 9.7* 9.6* 9.7* 9.7*  HCT 31.3* 31.1* 31.7* 31.4*  MCV 101.0* 99.4 99.1 99.1  PLT 264 245 239 208    Cardiac Enzymes: No results for input(s): CKTOTAL, CKMB, CKMBINDEX, TROPONINI in the last 168 hours.  BNP: BNP (last 3 results)  Recent  Labs  04/13/16 1046 06/12/16 0556 06/23/16 0705  BNP 107.2* 637.8* 1,426.3*    ProBNP (last 3 results)  Recent Labs  07/22/15 1536  PROBNP 61.0     CBG: No results for input(s): GLUCAP in the last 168 hours.  Coagulation Studies: No results for input(s): LABPROT, INR in the last 72 hours.  Other results:   Imaging: No results found.   Medications:  Current Medications: . aspirin EC  81 mg Oral Daily  . clopidogrel  75 mg Oral Daily  . enoxaparin (LOVENOX) injection  30 mg Subcutaneous Q24H  . isosorbide mononitrate  30 mg Oral Daily  . metoprolol succinate  75 mg Oral Daily  . multivitamin with minerals  1 tablet Oral Daily  . pantoprazole  40 mg Oral BID  . simvastatin  20 mg Oral QHS  . sodium chloride flush  3 mL Intravenous Q12H    Infusions:     Assessment   1. Acute on chronic combined CHF, ICM. EF ~40-45% with mid to distal anterior, anteroseptal and apical HK 2. HCAP 3. CAD with previous silent LAD infarct 4. Aortic Stenosis, Mild 5. Alcoholic Cirrhosis 6. AKI on CKD stage 3 7. RBBB   Plan   Suspect low output physiology with increased response on milrinone, though pt declines catheterization. Creatinine remains elevated but trending down with holding IV lasix yesterday. BUN stable.  17 beats of NSVT on tele.  Mg 1.6 on 06/30/16. Recheck now and supp prior to D/c.   Stable off milrinone and transitioning to po diuretics. Weight down 24 lbs overall this admission.   He still has peripheral edema, but his kidneys have limited further diuresis.  Will transition to po on Torsemide 40 mg BID.     Echo 06/15/16 LVEF 45-50% with Grade 1 DD. Mild AS + AI. EF decreased from 60-65% in June. Likely ischemic with recent NSTEMI, although patient refused catheterization.  Pt remains DNR/DNI.    BCx and UCx Negative. Doxycycline per primary team.   Dispo: To SNF today.  Recommend BMET next week at SNF, and follow up the following week in HF clinic  07/15/16 at 0930.   HF meds for discharge Torsemide 40 mg BID Potassium 20 meq BID ASA 81 mg Clopidogrel 75 mg daily Imdur 30 mg daily Simvastatin 20 mg daily   Length of Stay: [address]  [name], Hershal Coria 07/02/2016, 7:32 AM  Advanced Heart Failure Team Pager [phone] (M-F; 7a - 4p)  Please contact [facility] for night-coverage after hours (4p -7a ) and weekends on [email or web address]   Patient seen and examined with Oda Kilts, PA-C. We discussed all aspects of the encounter. I agree with the assessment and plan as stated above.   He is much improved from volume standpoint, Renal function stable. [place] for d/c to SNF of above meds. Check labs in one week. Will arrange f/u in HF Clinic.   Ahava Kissoon,MD 9:18 PM

## 2016-07-02 NOTE — Discharge Summary (Addendum)
PATIENT DETAILS Name: [name] Age: 82 y.o. Sex: male Date of Birth: 1929/09/12 MRN: [id number]. Admitting Physician: [name] Dickens, MD [name] [name], MD  Admit Date: 06/23/2016 Discharge date: 07/02/2016  Recommendations for Outpatient Follow-up:  1. Follow up with PCP in 1-2 weeks 2. Ensure follow-up at the CHF clinic and see below for appointment 3. Please obtain BMP in 5 days 4. 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 Started on antibiotics and diuretics with improvement.Cardiology consulted, and started on milrinone infusion, volume status significantly better by the day of discharge.  Brief Hospital Course: Acute respiratory failure with hypoxia: Multifactorial, secondary to acute systolic heart failure and healthcare associated pneumonia. Has completed a course of antibiotics, cardiology managing diuretics and milrinone infusion. Hypoxia has improved significantly compared to on admission, plans are to continue to titrate down oxygen to room air.  Active Problems: Acute on chronic systolic heart failure (EF 45-50% on echo on 06/15/16): Volume status is significantly better , weight down 251 pounds (176 pounds on admission). CHF team followed patient closely during his stay here, he was briefly started on the milrinone infusion. By day of discharge of volume status has improved, he is now being transitioned to oral diuretics. A follow-up appointment at the CHF clinic has already been made (see  below). He will require close monitoring of his weights-suggest daily weights, and continued close follow-up with the heart failure team.  Healthcare associated pneumonia: Leukocytosis has resolved, patient has completed a course of antimicrobial therapy. Blood cultures, urine streptococcal antigen negative.  Acute on chronic kidney disease stage III: ARF likely secondary to cardiorenal syndrome-was on milrinone infusion-which is now being discontinued-creatinine slowly plateaued and is now downtrending.Creatinine down to 2.36 on the day of discharge from a peak of 2.76 a few days back. Please continue to follow electrolytes closely, suggest that we repeat a deep breath in 5 days to check his electrolytes.  Dyslipidemia: Continue statin  History of Alcoholic cirrhosis: No longer consuming alcoholic beverages-on diuretics-follow electrolytes closely.   CAD: Recently with non-STEMI-recent nuclear stress test on 8/5 without ischemia. Note-patient had refused cardiac catheterization then. Continue aspirin, Plavix, metoprolol and statin. Without any chest pain.  ? Dementia/early cognitive dysfunction: Seen by psychiatry on 8/21-felt to have capacity to make decisions. Defer further workup for possible early cognitive dysfunction/dementia to the outpatient setting.   Venous stasis dermatitis of both lower extremities:UNNA boot in place -will need to be replaced twice a week  Procedures/Studies: None  Discharge Diagnoses:  Principal Problem:   Acute respiratory failure with hypoxia ([name]) Active Problems:   Hyperlipidemia   HYPERTENSION, BENIGN   Hypertrophic obstructive cardiomyopathy (HCC)   Bilateral lower extremity edema   Alcoholic cirrhosis (HCC)   Aortic stenosis, mild   Memory loss   CKD (chronic kidney disease) stage 3, GFR 30-59 ml/min   Iron deficiency anemia due to chronic blood loss   Elevated troponin   Acute combined systolic and diastolic CHF, NYHA class 1 ([name])    HCAP (healthcare-associated pneumonia)   AKI (acute kidney injury) ([name])  Venous stasis dermatitis of both lower extremities   Discharge Instructions:  Activity:  As tolerated with Full fall precautions use walker/cane & assistance as needed   Discharge Instructions    (HEART FAILURE PATIENTS) Call MD:  Anytime you have any of the following symptoms: 1) 3 pound weight gain in 24 hours or 5 pounds in 1 week 2) shortness of breath, with or without a dry hacking cough 3) swelling in the hands, feet or stomach 4) if you have to sleep on extra pillows at night in order to breathe.    Complete by:  As directed   Diet - low sodium heart healthy    Complete by:  As directed   Discharge instructions    Complete by:  As directed   You have Congestive Heart Failure: Please call your Cardiologist or Primary MD-Anytime you have any of the following symptoms:   1) 3 pound weight gain in 24 hours or 5 pounds in 1 week  2) shortness of breath, with or without a dry hacking cough 3) swelling in the hands, feet or stomach 4) if you have to sleep on extra pillows at night in order to breathe  Follow cardiac low salt diet and 1.5 lit/day fluid restriction.  Follow with Primary MD  [name] Cower, MD  and CHF Clinic in 1 week  Please repeat BMET in 5 days Please get a complete blood count and chemistry panel checked by your Primary MD at your next visit, and again as instructed by your Primary MD.  Get Medicines reviewed and adjusted: Please take all your medications with you for your next visit with your Primary MD  Laboratory/radiological data: Please request your Primary MD to go over all hospital tests and procedure/radiological results at the follow up, please ask your Primary MD to get all Hospital records sent to his/her office.  In some cases, they will be blood work, cultures and biopsy results pending at the time of your discharge. Please request that your primary care M.D. follows up on these  results.  Also Note the following: If you experience worsening of your admission symptoms, develop shortness of breath, life threatening emergency, suicidal or homicidal thoughts you must seek medical attention immediately by calling 911 or calling your MD immediately  if symptoms less severe.  You must read complete instructions/literature along with all the possible adverse reactions/side effects for all the Medicines you take and that have been prescribed to you. Take any new Medicines after you have completely understood and accpet all the possible adverse reactions/side effects.   Do not drive when taking Pain medications or sleeping medications (Benzodaizepines)  Do not take more than prescribed Pain, Sleep and Anxiety Medications. It is not advisable to combine anxiety,sleep and pain medications without talking with your primary care practitioner  Special Instructions: If you have smoked or chewed Tobacco  in the last 2 yrs please stop smoking, stop any regular Alcohol  and or any Recreational drug use.  Wear Seat belts while driving.  Please note: You were cared for by a hospitalist during your hospital stay. Once you are discharged, your primary care physician will handle any further medical issues. Please note that NO REFILLS for any discharge medications will be authorized once you are discharged, as it is imperative that you return to your primary care physician (or establish a relationship with a primary care physician if you do not have one) for your post hospital discharge needs so that they can reassess your  need for medications and monitor your lab values.   Increase activity slowly    Complete by:  As directed       Medication List    STOP taking these medications   amLODipine 10 MG tablet Commonly known as:  NORVASC   doxycycline 100 MG capsule Commonly known as:  VIBRAMYCIN   furosemide 40 MG tablet Commonly known as:  LASIX     TAKE these medications     acetaminophen 325 MG tablet Commonly known as:  TYLENOL Take 650 mg by mouth every 6 (six) hours as needed for pain.   ALPRAZolam 0.25 MG tablet Commonly known as:  XANAX Take 1 tablet (0.25 mg total) by mouth at bedtime as needed for anxiety.   aspirin 81 MG EC tablet Take 1 tablet (81 mg total) by mouth daily.   clopidogrel 75 MG tablet Commonly known as:  PLAVIX Take 1 tablet (75 mg total) by mouth daily.   isosorbide mononitrate 30 MG 24 hr tablet Commonly known as:  IMDUR TAKE ONE TABLET EACH DAY What changed:  See the new instructions.   metoprolol succinate 25 MG 24 hr tablet Commonly known as:  TOPROL-XL Take 3 tablets (75 mg total) by mouth daily. Take with or immediately following a meal.   multivitamin with minerals Tabs tablet Take 1 tablet by mouth daily.   pantoprazole 40 MG tablet Commonly known as:  PROTONIX Take 1 tablet (40 mg total) by mouth 2 (two) times daily.   Potassium Chloride ER 20 MEQ Tbcr Take 20 mEq by mouth 2 (two) times daily.   simvastatin 20 MG tablet Commonly known as:  ZOCOR TAKE ONE TABLET AT BEDTIME What changed:  See the new instructions.   torsemide 20 MG tablet Commonly known as:  DEMADEX Take 2 tablets (40 mg total) by mouth 2 (two) times daily.   zolpidem 5 MG tablet Commonly known as:  AMBIEN Take 1 tablet (5 mg total) by mouth at bedtime as needed for sleep.      Follow-up Information    [facility] Follow up on 07/15/2016.   Specialty:  Cardiology Why:  at 0930 for post hospital follow up. Please bring all of your medications to your visit. The code for parking is 0030. Contact information: [address] [id number] [place]       [name] Cower, MD. Schedule an appointment as soon as possible for a visit in 3 week(s).   Specialties:  Internal Medicine, Radiology Contact information: [place]  [address] [phone]          No Known Allergies    Consultations:   cardiology  Other Procedures/Studies: Dg Chest 2 View  Result Date: 06/24/2016 CLINICAL DATA:  Acute CHF, shortness of breath, cardiomyopathy, cirrhosis, chronic renal insufficiency EXAM: CHEST  2 VIEW COMPARISON:  PA and lateral chest x-ray of June 23, 2016 FINDINGS: There remains volume loss on the right with elevation of the hemidiaphragm. The left lung is adequately inflated. Coarse interstitial markings are present in the lower left lung. There is partial obscuration of the hemidiaphragm. The cardiac silhouette is enlarged. The pulmonary vascularity is mildly prominent centrally. IMPRESSION: Allowing for differences in positioning there has not been significant interval change since yesterday's study. Persistent left basilar atelectasis or pneumonia with trace left pleural effusion. Stable cardiomegaly. Electronically Signed   By: David  Martinique M.D.   On: 06/24/2016 07:52  Dg Chest 2 View  Result Date: 06/23/2016 CLINICAL DATA:  Shortness of Breath EXAM: CHEST  2 VIEW COMPARISON:  June 12, 2016 FINDINGS: There remains airspace consolidation throughout much of the left lower lobe with small left effusion. There is stable elevation of the right hemidiaphragm. There is underlying mild chronic interstitial prominence without frank edema. Heart size is within normal limits. The pulmonary vascularity is normal. No adenopathy. There is degenerative change in the thoracic spine. IMPRESSION: Persistent left lower lobe airspace consolidation small left effusion. Mild underlying interstitial prominence, probably representing chronic inflammatory type change. No new opacity. Stable cardiac silhouette. Stable elevation of the right hemidiaphragm. Electronically Signed   By: Lowella Grip III M.D.   On: 06/23/2016 07:43   Dg Chest 2 View  Result Date: 06/12/2016 CLINICAL DATA:  82 year old male with chest pain and shortness  of breath EXAM: CHEST  2 VIEW COMPARISON:  Chest radiograph dated 05/28/2016 FINDINGS: Shallow inspiration. There is eventration of the right hemidiaphragm with atelectatic changes of the right lung base. Patchy area of hazy density at the left lung base may represent atelectasis versus infiltrate. There is no pneumothorax. Stable enlarged cardiac silhouette. No acute osseous pathology. IMPRESSION: Patchy left lung base airspace opacity, atelectasis versus infiltrate. Clinical correlation and follow-up recommended. Electronically Signed   By: Anner Crete M.D.   On: 06/12/2016 06:47   Nm Myocar Multi W/spect W/wall Motion / Ef  Result Date: 06/13/2016 CLINICAL DATA:  82 year old male with chest pain hypertension was short of breath. EXAM: MYOCARDIAL IMAGING WITH SPECT (REST AND PHARMACOLOGIC-STRESS) GATED LEFT VENTRICULAR WALL MOTION STUDY LEFT VENTRICULAR EJECTION FRACTION TECHNIQUE: Standard myocardial SPECT imaging was performed after resting intravenous injection of 10 mCi Tc-22m tetrofosmin. Subsequently, intravenous infusion of Lexiscan was performed under the supervision of the Cardiology staff. At peak effect of the drug, 30 mCi Tc-19m tetrofosmin was injected intravenously and standard myocardial SPECT imaging was performed. Quantitative gated imaging was also performed to evaluate left ventricular wall motion, and estimate left ventricular ejection fraction. COMPARISON:  None. FINDINGS: Perfusion: There is a large region of decreased profusion involving the apex, anterior, mid, and basilar segments of the inferior wall, septal wall and the anterior wall. This large defect is matched on rest and stress. Preserved profusion to the lateral wall. Wall Motion:  Septal dyskinesia. Left Ventricular Ejection Fraction: 38 % End diastolic volume 123XX123 ml End systolic volume 94 ml IMPRESSION: 1. Large region of infarcted myocardium involving the apex, inferior wall, septal wall and anterior wall. No reversible  ischemia. 2. Septal dyskinesia. 3. Left ventricular ejection fraction 38% 4. Non invasive risk stratification*: Intermediate owing to LEFT ventricular dysfunction. *2012 Appropriate Use Criteria for Coronary Revascularization Focused Update: J Am Coll Cardiol. [id number]. http://content.[email or web address]?[email or web address] Electronically Signed   By: Suzy Bouchard M.D.   On: 06/13/2016 13:57   Dg Chest Port 1 View  Result Date: 06/27/2016 CLINICAL DATA:  HCAP (healthcare associated pna) CHF, CHF exacerbation. No complaints per pt this am as pt was just waking up and said he couldn't tell how he felt just yet. EXAM: PORTABLE CHEST 1 VIEW COMPARISON:  06/24/2016 FINDINGS: Patchy bilateral airspace lung opacities are without substantial change from the prior exam allowing for differences in patient positioning. Elevation right hemidiaphragm is stable. There are probable small pleural effusions. No pneumothorax. Cardiac silhouette is partly obscured but grossly normal in size. IMPRESSION: 1. No significant change in the bilateral lower lung zone patchy airspace opacities. This may reflect multifocal pneumonia or asymmetric pulmonary  edema. Electronically Signed   By: Lajean Manes M.D.   On: 06/27/2016 07:31     TODAY-DAY OF DISCHARGE:  Subjective:   Rosalita Chessman today has no headache,no chest abdominal pain,no new weakness tingling or numbness, feels much better wants to go home today.   Objective:   Blood pressure (!) 110/46, pulse 73, temperature 97.7 F (36.5 C), temperature source Oral, resp. rate 20, height 5\' 8"  (1.727 m), weight 68.8 kg (151 lb 9.6 oz), SpO2 98 %.  Intake/Output Summary (Last 24 hours) at 07/02/16 0911 Last data filed at 07/02/16 0908  Gross per 24 hour  Intake              960 ml  Output             1101 ml  Net             -141 ml   Filed Weights   06/30/16 0618 07/01/16 0614 07/02/16 0350  Weight: 69.3 kg (152 lb 12.8 oz) 68.9 kg (151 lb 14.4  oz) 68.8 kg (151 lb 9.6 oz)    Exam: Awake Alert, Oriented *3, No new F.N deficits, Normal affect Gideon.AT,PERRAL Supple Neck,No JVD, No cervical lymphadenopathy appriciated.  Symmetrical Chest wall movement, Good air movement bilaterally, CTAB RRR,No Gallops,Rubs or new Murmurs, No Parasternal Heave +ve B.Sounds, Abd Soft, Non tender, No organomegaly appriciated, No rebound -guarding or rigidity. No Cyanosis, Clubbing or edema, No new Rash or bruise   PERTINENT RADIOLOGIC STUDIES: Dg Chest 2 View  Result Date: 06/24/2016 CLINICAL DATA:  Acute CHF, shortness of breath, cardiomyopathy, cirrhosis, chronic renal insufficiency EXAM: CHEST  2 VIEW COMPARISON:  PA and lateral chest x-ray of June 23, 2016 FINDINGS: There remains volume loss on the right with elevation of the hemidiaphragm. The left lung is adequately inflated. Coarse interstitial markings are present in the lower left lung. There is partial obscuration of the hemidiaphragm. The cardiac silhouette is enlarged. The pulmonary vascularity is mildly prominent centrally. IMPRESSION: Allowing for differences in positioning there has not been significant interval change since yesterday's study. Persistent left basilar atelectasis or pneumonia with trace left pleural effusion. Stable cardiomegaly. Electronically Signed   By: David  Martinique M.D.   On: 06/24/2016 07:52   Dg Chest 2 View  Result Date: 06/23/2016 CLINICAL DATA:  Shortness of Breath EXAM: CHEST  2 VIEW COMPARISON:  June 12, 2016 FINDINGS: There remains airspace consolidation throughout much of the left lower lobe with small left effusion. There is stable elevation of the right hemidiaphragm. There is underlying mild chronic interstitial prominence without frank edema. Heart size is within normal limits. The pulmonary vascularity is normal. No adenopathy. There is degenerative change in the thoracic spine. IMPRESSION: Persistent left lower lobe airspace consolidation small left  effusion. Mild underlying interstitial prominence, probably representing chronic inflammatory type change. No new opacity. Stable cardiac silhouette. Stable elevation of the right hemidiaphragm. Electronically Signed   By: Lowella Grip III M.D.   On: 06/23/2016 07:43   Dg Chest 2 View  Result Date: 06/12/2016 CLINICAL DATA:  82 year old male with chest pain and shortness of breath EXAM: CHEST  2 VIEW COMPARISON:  Chest radiograph dated 05/28/2016 FINDINGS: Shallow inspiration. There is eventration of the right hemidiaphragm with atelectatic changes of the right lung base. Patchy area of hazy density at the left lung base may represent atelectasis versus infiltrate. There is no pneumothorax. Stable enlarged cardiac silhouette. No acute osseous pathology. IMPRESSION: Patchy left lung base airspace opacity, atelectasis versus  infiltrate. Clinical correlation and follow-up recommended. Electronically Signed   By: Anner Crete M.D.   On: 06/12/2016 06:47   Nm Myocar Multi W/spect W/wall Motion / Ef  Result Date: 06/13/2016 CLINICAL DATA:  82 year old male with chest pain hypertension was short of breath. EXAM: MYOCARDIAL IMAGING WITH SPECT (REST AND PHARMACOLOGIC-STRESS) GATED LEFT VENTRICULAR WALL MOTION STUDY LEFT VENTRICULAR EJECTION FRACTION TECHNIQUE: Standard myocardial SPECT imaging was performed after resting intravenous injection of 10 mCi Tc-76m tetrofosmin. Subsequently, intravenous infusion of Lexiscan was performed under the supervision of the Cardiology staff. At peak effect of the drug, 30 mCi Tc-47m tetrofosmin was injected intravenously and standard myocardial SPECT imaging was performed. Quantitative gated imaging was also performed to evaluate left ventricular wall motion, and estimate left ventricular ejection fraction. COMPARISON:  None. FINDINGS: Perfusion: There is a large region of decreased profusion involving the apex, anterior, mid, and basilar segments of the inferior wall,  septal wall and the anterior wall. This large defect is matched on rest and stress. Preserved profusion to the lateral wall. Wall Motion:  Septal dyskinesia. Left Ventricular Ejection Fraction: 38 % End diastolic volume 123XX123 ml End systolic volume 94 ml IMPRESSION: 1. Large region of infarcted myocardium involving the apex, inferior wall, septal wall and anterior wall. No reversible ischemia. 2. Septal dyskinesia. 3. Left ventricular ejection fraction 38% 4. Non invasive risk stratification*: Intermediate owing to LEFT ventricular dysfunction. *2012 Appropriate Use Criteria for Coronary Revascularization Focused Update: J Am Coll Cardiol. [id number]. http://content.[email or web address]?[email or web address] Electronically Signed   By: Suzy Bouchard M.D.   On: 06/13/2016 13:57   Dg Chest Port 1 View  Result Date: 06/27/2016 CLINICAL DATA:  HCAP (healthcare associated pna) CHF, CHF exacerbation. No complaints per pt this am as pt was just waking up and said he couldn't tell how he felt just yet. EXAM: PORTABLE CHEST 1 VIEW COMPARISON:  06/24/2016 FINDINGS: Patchy bilateral airspace lung opacities are without substantial change from the prior exam allowing for differences in patient positioning. Elevation right hemidiaphragm is stable. There are probable small pleural effusions. No pneumothorax. Cardiac silhouette is partly obscured but grossly normal in size. IMPRESSION: 1. No significant change in the bilateral lower lung zone patchy airspace opacities. This may reflect multifocal pneumonia or asymmetric pulmonary edema. Electronically Signed   By: Lajean Manes M.D.   On: 06/27/2016 07:31     PERTINENT LAB RESULTS: CBC: No results for input(s): WBC, HGB, HCT, PLT in the last 72 hours. CMET CMP     Component Value Date/Time   NA 138 07/02/2016 0542   K 3.7 07/02/2016 0542   CL 97 (L) 07/02/2016 0542   CO2 32 07/02/2016 0542   GLUCOSE 107 (H) 07/02/2016 0542   BUN 80 (H) 07/02/2016  0542   CREATININE 2.36 (H) 07/02/2016 0542   CALCIUM 8.8 (L) 07/02/2016 0542   PROT 6.0 (L) 06/12/2016 0556   ALBUMIN 3.0 (L) 06/12/2016 0556   AST 24 06/12/2016 0556   ALT 15 (L) 06/12/2016 0556   ALKPHOS 85 06/12/2016 0556   BILITOT 0.6 06/12/2016 0556   GFRNONAA 24 (L) 07/02/2016 0542   GFRAA 27 (L) 07/02/2016 0542    GFR Estimated Creatinine Clearance: 22.1 mL/min (by C-G formula based on SCr of 2.36 mg/dL). No results for input(s): LIPASE, AMYLASE in the last 72 hours. No results for input(s): CKTOTAL, CKMB, CKMBINDEX, TROPONINI in the last 72 hours. Invalid input(s): POCBNP No results for input(s): DDIMER in the last 72 hours. No results for input(s): HGBA1C in the  last 72 hours. No results for input(s): CHOL, HDL, LDLCALC, TRIG, CHOLHDL, LDLDIRECT in the last 72 hours. No results for input(s): TSH, T4TOTAL, T3FREE, THYROIDAB in the last 72 hours.  Invalid input(s): FREET3 No results for input(s): VITAMINB12, FOLATE, FERRITIN, TIBC, IRON, RETICCTPCT in the last 72 hours. Coags: No results for input(s): INR in the last 72 hours.  Invalid input(s): PT Microbiology: Recent Results (from the past 240 hour(s))  Blood Culture (routine x 2)     Status: None   Collection Time: 06/23/16  8:28 AM  Result Value Ref Range Status   Specimen Description BLOOD RIGHT ANTECUBITAL  Final   Special Requests BOTTLES DRAWN AEROBIC AND ANAEROBIC  5CC  Final   Culture NO GROWTH 5 DAYS  Final   Report Status 06/28/2016 FINAL  Final  Urine culture     Status: None   Collection Time: 06/23/16  8:31 AM  Result Value Ref Range Status   Specimen Description URINE, RANDOM  Final   Special Requests NONE  Final   Culture NO GROWTH  Final   Report Status 06/24/2016 FINAL  Final  Blood Culture (routine x 2)     Status: None   Collection Time: 06/23/16  8:35 AM  Result Value Ref Range Status   Specimen Description BLOOD RIGHT ANTECUBITAL  Final   Special Requests BOTTLES DRAWN AEROBIC ONLY  5CC   Final   Culture NO GROWTH 5 DAYS  Final   Report Status 06/28/2016 FINAL  Final    FURTHER DISCHARGE INSTRUCTIONS:  Get Medicines reviewed and adjusted: Please take all your medications with you for your next visit with your Primary MD  Laboratory/radiological data: Please request your Primary MD to go over all hospital tests and procedure/radiological results at the follow up, please ask your Primary MD to get all Hospital records sent to his/her office.  In some cases, they will be blood work, cultures and biopsy results pending at the time of your discharge. Please request that your primary care M.D. goes through all the records of your hospital data and follows up on these results.  Also Note the following: If you experience worsening of your admission symptoms, develop shortness of breath, life threatening emergency, suicidal or homicidal thoughts you must seek medical attention immediately by calling 911 or calling your MD immediately  if symptoms less severe.  You must read complete instructions/literature along with all the possible adverse reactions/side effects for all the Medicines you take and that have been prescribed to you. Take any new Medicines after you have completely understood and accpet all the possible adverse reactions/side effects.   Do not drive when taking Pain medications or sleeping medications (Benzodaizepines)  Do not take more than prescribed Pain, Sleep and Anxiety Medications. It is not advisable to combine anxiety,sleep and pain medications without talking with your primary care practitioner  Special Instructions: If you have smoked or chewed Tobacco  in the last 2 yrs please stop smoking, stop any regular Alcohol  and or any Recreational drug use.  Wear Seat belts while driving.  Please note: You were cared for by a hospitalist during your hospital stay. Once you are discharged, your primary care physician will handle any further medical issues. Please  note that NO REFILLS for any discharge medications will be authorized once you are discharged, as it is imperative that you return to your primary care physician (or establish a relationship with a primary care physician if you do not have one) for your post  hospital discharge needs so that they can reassess your need for medications and monitor your lab values.  Total Time spent coordinating discharge including counseling, education and face to face time equals  45 minutes.  SignedOren Binet 07/02/2016 9:11 AM

## 2016-07-02 NOTE — Progress Notes (Signed)
Patient discharge to [facility] accompanied by  PTAr via stretcher. Discharge instructions given. Patient verbalizes understanding. All personal belongings given. Telemetry box and IV removed prior to discharge and site in good condition.

## 2016-07-03 ENCOUNTER — Telehealth: Payer: Self-pay | Admitting: Emergency Medicine

## 2016-07-03 NOTE — Telephone Encounter (Signed)
[facility] called and wants to know if they can get patients latest office visit and H&P faxed to them. Fax # is [phone]. Please follow up thanks.

## 2016-07-03 NOTE — Telephone Encounter (Signed)
Faxed to [facility]

## 2016-07-07 ENCOUNTER — Non-Acute Institutional Stay (SKILLED_NURSING_FACILITY): Payer: Medicare Other | Admitting: Internal Medicine

## 2016-07-07 ENCOUNTER — Encounter: Payer: Self-pay | Admitting: Internal Medicine

## 2016-07-07 DIAGNOSIS — I5041 Acute combined systolic (congestive) and diastolic (congestive) heart failure: Secondary | ICD-10-CM | POA: Diagnosis not present

## 2016-07-07 DIAGNOSIS — F05 Delirium due to known physiological condition: Secondary | ICD-10-CM

## 2016-07-07 DIAGNOSIS — I8312 Varicose veins of left lower extremity with inflammation: Secondary | ICD-10-CM

## 2016-07-07 DIAGNOSIS — J189 Pneumonia, unspecified organism: Secondary | ICD-10-CM | POA: Diagnosis not present

## 2016-07-07 DIAGNOSIS — I8311 Varicose veins of right lower extremity with inflammation: Secondary | ICD-10-CM | POA: Diagnosis not present

## 2016-07-07 DIAGNOSIS — D500 Iron deficiency anemia secondary to blood loss (chronic): Secondary | ICD-10-CM

## 2016-07-07 DIAGNOSIS — I872 Venous insufficiency (chronic) (peripheral): Secondary | ICD-10-CM

## 2016-07-07 DIAGNOSIS — R410 Disorientation, unspecified: Secondary | ICD-10-CM

## 2016-07-07 DIAGNOSIS — N183 Chronic kidney disease, stage 3 unspecified: Secondary | ICD-10-CM

## 2016-07-07 DIAGNOSIS — K7030 Alcoholic cirrhosis of liver without ascites: Secondary | ICD-10-CM

## 2016-07-07 DIAGNOSIS — J9601 Acute respiratory failure with hypoxia: Secondary | ICD-10-CM | POA: Diagnosis not present

## 2016-07-07 DIAGNOSIS — R413 Other amnesia: Secondary | ICD-10-CM

## 2016-07-07 NOTE — Progress Notes (Addendum)
Patient ID: [name], male   DOB: 17-Mar-1929, 82 y.o.   MRN: [id number]  Provider:  Rexene Edison. Mariea Clonts, D.O., C.M.D. Location:   [facility] Number: [id number] rehab Place of Service:  SNF (31)  PCP: [name], MD Patient Care Team: Biagio Borg, MD as PCP - General (Internal Medicine) Hillary Bow, MD (Cardiology) Sable Feil, MD (Gastroenterology) Luberta Mutter, MD (Ophthalmology)  Extended Emergency Contact Information Primary Emergency Contact: Salomon Mast [place] Phone: [phone] Relation: Son Secondary Emergency Contact: [place] Mobile Phone: [phone] Relation: Son  Code Status: FULL CODE--when asked upon arrival at rehab and again today by myself--pt clearly states he would like to be resuscitated.  He says he is not "feeble of mind" (but does suffer from some delirium and cognitive impairment at present).  Goals of Care: Advanced Directive information Advanced Directives 07/07/2016  Does patient have an advance directive? Yes  Type of Advance Directive Out of facility DNR (pink MOST or yellow form)  Does patient want to make changes to advanced directive? -  Copy of advanced directive(s) in chart? Yes  Would patient like information on creating an advanced directive? -  Pre-existing out of facility DNR order (yellow form or pink MOST form) -  Some encounter information is confidential and restricted. Go to Review Flowsheets activity to see all data.   Chief Complaint  Patient presents with  . New Admit To SNF    rehab admission    HPI: Patient is a 82 y.o. male seen today for admission to [facility] s/p hospitalization with acute on chronic systolic heart failure and health care associated pneumonia.  He has a h/o chronic diastolic chf, htn, hl, and alcohol abuse with cirrhosis.  At admission, his weight was 176 and dropped to 151 with diuresis and milrinone infusion  through cardiology.  He had an echo which showed EF 45-50% on echo 06/15/16.  He had a recent NSTEMI prior to that and his nuclear stress was negative for ischemia.  He subsequently had refused a cath.  Apparently, he has been having cognitive decline and there were concerns about his ability to make such a decision--psychiatry felt he was capable of deciding for himself.    His stay was complicated by acute on CKDIII felt to be cardiorenal syndrome with cr peaking at 2.76 and improving to 2.36 at discharge.    He also was felt to have HCAP with leukocytosis which trended down with antibiotics.    For his venous stasis, his legs were wrapped in unnaboots to be changed twice a week on Sundays and Thursdays.    Of note, he was taken off lasix 40mg , doxycycline and amlodipine.  When seen, he had no complaints.  He was dosing off.  He said he'd been up in the chair all morning.  I could not get him to crack a smile.  I confirmed his code status and he is clear that he wants to maintain full code status saying "my mind is not feeble".  He scored 28/30 on his mmse missing 2 points on orientation to place.  He thought at that time that he was at the hospital.  He was apparently up through the night (per nursing notes) yelling out for help instead of using his call bell.  He did receive two doses of xanax.  He's on ambien which was just changed to scheduled yesterday.  Today, he is aware that he  is at [facility] rehab.    He'd had a f/u bmp drawn this am which is pending.  His last weight is 146.6 lbs.    Past Medical History:  Diagnosis Date  . Acute combined systolic and diastolic CHF, NYHA class 1 ([place])   . Acute respiratory failure ([place])   . Alcoholic cirrhosis ([place])   . 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 Alcoholism ([place])   . Aortic stenosis   . Ascites 11/2004   "early" SBP on paracentesis.   . Chronic gastritis 05/01/2016  . CKD (chronic kidney disease), stage III 10/11/2015  . Colon polyp  perhaps in 1990s   pathology/type not known  . Diverticulosis of colon perhaps in 1990s  . Duodenal ulcer 04/2016  . Fracture of wrist 2014 spring vs early summer   refused orthopods suggestion to set the bone.   Marland Kitchen GERD (gastroesophageal reflux disease)   . HLD (hyperlipidemia)   . HTN (hypertension)   . Hypertrophic cardiomyopathy ([place])   . Portal hypertensive gastropathy 2006  . Renal insufficiency   . Rhabdomyolysis 01/2005   Past Surgical History:  Procedure Laterality Date  . BASAL CELL CARCINOMA EXCISION Left 11/2012   lower eyelid  . ESOPHAGOGASTRODUODENOSCOPY N/A 08/04/2013   Procedure: ESOPHAGOGASTRODUODENOSCOPY (EGD);  Surgeon: Ladene Artist, MD;  Location: [facility] ENDOSCOPY;  Service: Endoscopy;  Laterality: N/A;  . ESOPHAGOGASTRODUODENOSCOPY (EGD) WITH PROPOFOL N/A 05/01/2016   Procedure: ESOPHAGOGASTRODUODENOSCOPY (EGD) WITH PROPOFOL;  Surgeon: Jerene Bears, MD;  Location: [facility] ENDOSCOPY;  Service: Endoscopy;  Laterality: N/A;  . INGUINAL HERNIA REPAIR    . MELANOMA EXCISION Left 02/2011   malignant melanoma of thigh    reports that he has never smoked. He has never used smokeless tobacco. He reports that he does not drink alcohol or use drugs. Social History   Social History  . Marital status: Married    Spouse name: N/A  . Number of children: 4  . Years of education: 24   Occupational History  . pathologist Retired    retired   Social History Main Topics  . Smoking status: Never Smoker  . Smokeless tobacco: Never Used  . Alcohol use No     Comment: member of AA  . Drug use: No  . Sexual activity: Not on file   Other Topics Concern  . 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  Remains independent. ACP- not discussed, will approach at next OV     Family History  Problem Relation Age of Onset  . Cancer Father 49    lung cancer  . Heart disease Mother   . Heart disease Sister     Health Maintenance    Topic Date Due  . PNA vac Low Risk Adult (2 of 2 - PPSV23) 06/15/2015  . INFLUENZA VACCINE  06/09/2016  . TETANUS/TDAP  06/14/2024  . ZOSTAVAX  Completed    No Known Allergies    Medication List       Accurate as of 07/07/16 10:40 AM. Always use your most recent med list.          acetaminophen 325 MG tablet Commonly known as:  TYLENOL Take 650 mg by mouth every 6 (six) hours as needed for pain.   ALPRAZolam 0.25 MG tablet Commonly known as:  XANAX Take 1 tablet (0.25 mg total) by mouth at bedtime as needed for anxiety.   aspirin 81 MG EC tablet Take 1 tablet (81 mg  total) by mouth daily.   clopidogrel 75 MG tablet Commonly known as:  PLAVIX Take 1 tablet (75 mg total) by mouth daily.   isosorbide mononitrate 30 MG 24 hr tablet Commonly known as:  IMDUR Take 30 mg by mouth daily.   metoprolol succinate 25 MG 24 hr tablet Commonly known as:  TOPROL-XL Take 3 tablets (75 mg total) by mouth daily. Take with or immediately following a meal.   multivitamin with minerals Tabs tablet Take 1 tablet by mouth daily.   pantoprazole 40 MG tablet Commonly known as:  PROTONIX Take 1 tablet (40 mg total) by mouth 2 (two) times daily.   Potassium Chloride ER 20 MEQ Tbcr Take 20 mEq by mouth 2 (two) times daily.   simvastatin 20 MG tablet Commonly known as:  ZOCOR Take 20 mg by mouth daily.   torsemide 20 MG tablet Commonly known as:  DEMADEX Take 2 tablets (40 mg total) by mouth 2 (two) times daily.   zolpidem 5 MG tablet Commonly known as:  AMBIEN Take 1 tablet (5 mg total) by mouth at bedtime as needed for sleep.       Review of Systems  Constitutional: Positive for activity change and appetite change. Negative for chills, fever and unexpected weight change.  HENT: Positive for hearing loss.        Could not hear me knocking at the door and had tv loud  Eyes:       Glasses  Respiratory: Negative for chest tightness, shortness of breath and wheezing.         Wearing oxygen 4L via [place]  Cardiovascular: Positive for leg swelling. Negative for chest pain and palpitations.       Unnaboots in place  Gastrointestinal: Negative for abdominal distention.  Endocrine: Positive for polyuria.  Genitourinary: Positive for frequency and urgency. Negative for difficulty urinating.       Due to his prostate, he reports  Musculoskeletal: Positive for gait problem.       Had been walking with cane or walker prehospitalization  Skin: Positive for pallor.  Neurological: Positive for weakness. Negative for dizziness.  Psychiatric/Behavioral: Positive for confusion and sleep disturbance. The patient is nervous/anxious.     Vitals:   07/07/16 1024  BP: (!) 144/71  Pulse: 80  Resp: (!) 22  Temp: (!) 96.7 F (35.9 C)  TempSrc: Oral  SpO2: 98%  Weight: 146 lb (66.2 kg)   Body mass index is 22.2 kg/m. Physical Exam  Constitutional: No distress.  Frail white male resting in recliner chair  HENT:  Head: Normocephalic and atraumatic.  Right Ear: External ear normal.  Left Ear: External ear normal.  Nose: Nose normal.  Eyes: Conjunctivae are normal. Pupils are equal, round, and reactive to light.  glasses  Neck: Neck supple. No JVD present.  Cardiovascular: Normal rate and regular rhythm.   Murmur heard. 3/6 systolic murmur audible throughout precordium  Pulmonary/Chest: Effort normal.  Diminished breath sounds at right base with a few scattered crackles mid lung  Abdominal: Soft. Bowel sounds are normal.  Musculoskeletal:  Has some joint deformities of wrists, hands  Lymphadenopathy:    He has no cervical adenopathy.  Neurological: No cranial nerve deficit.  Lethargic, falling asleep repeatedly throughout the visit, is oriented today but was not when MMSE done (to place)  Skin:  Some excoriations of his arms, wearing unnaboots on legs presently  Psychiatric:   Flat affect, did not smile at all during visit   Labs reviewed: Basic  Metabolic  Panel:  Recent Labs  06/27/16 0300  06/30/16 0508 07/01/16 0423 07/02/16 0542  NA 141  < > 138 138 138  K 3.8  < > 3.6 3.4* 3.7  CL 109  < > 98* 97* 97*  CO2 21*  < > 30 32 32  GLUCOSE 132*  < > 137* 125* 107*  BUN 57*  < > 76* 81* 80*  CREATININE 2.84*  < > 2.67* 2.72* 2.36*  CALCIUM 8.6*  < > 8.5* 8.5* 8.8*  MG 1.8  --  1.6*  --  1.9  < > = values in this interval not displayed. Liver Function Tests:  Recent Labs  04/13/16 1046 04/30/16 0422 06/12/16 0556  AST 21 19 24   ALT 17 15* 15*  ALKPHOS 75 63 85  BILITOT 0.7 0.6 0.6  PROT 6.7 5.5* 6.0*  ALBUMIN 3.5 2.7* 3.0*   No results for input(s): LIPASE, AMYLASE in the last 8760 hours. No results for input(s): AMMONIA in the last 8760 hours. CBC:  Recent Labs  05/28/16 1556 06/12/16 0556 06/23/16 0705  06/27/16 0300 06/28/16 0325 06/29/16 0554  WBC 9.7 8.3 16.2*  < > 11.1* 10.0 9.8  NEUTROABS 7.5 5.8 12.1*  --   --   --   --   HGB 11.6* 10.3* 11.1*  < > 9.6* 9.7* 9.7*  HCT 35.3* 33.3* 35.2*  < > 31.1* 31.7* 31.4*  MCV 93.9 99.7 100.0  < > 99.4 99.1 99.1  PLT 234.0 229 369  < > 245 239 208  < > = values in this interval not displayed. Cardiac Enzymes:  Recent Labs  06/12/16 2014 06/13/16 0035 06/23/16 0705  TROPONINI 0.81* 0.75* 0.13*   BNP: Invalid input(s): POCBNP Lab Results  Component Value Date   HGBA1C 5.7 02/22/2015   Lab Results  Component Value Date   TSH 0.74 10/23/2015   Lab Results  Component Value Date   VITAMINB12 562 05/26/2011   Lab Results  Component Value Date   FOLATE >24.8 05/26/2011   Lab Results  Component Value Date   IRON 38 (L) 05/26/2011   FERRITIN 16.4 (L) 05/26/2011    Imaging and Procedures obtained prior to SNF admission: Dg Chest 2 View  Result Date: 06/24/2016 CLINICAL DATA:  Acute CHF, shortness of breath, cardiomyopathy, cirrhosis, chronic renal insufficiency EXAM: CHEST  2 VIEW COMPARISON:  PA and lateral chest x-ray of June 23, 2016 FINDINGS:  There remains volume loss on the right with elevation of the hemidiaphragm. The left lung is adequately inflated. Coarse interstitial markings are present in the lower left lung. There is partial obscuration of the hemidiaphragm. The cardiac silhouette is enlarged. The pulmonary vascularity is mildly prominent centrally. IMPRESSION: Allowing for differences in positioning there has not been significant interval change since yesterday's study. Persistent left basilar atelectasis or pneumonia with trace left pleural effusion. Stable cardiomegaly. Electronically Signed   By: David  Martinique M.D.   On: 06/24/2016 07:52   Dg Chest 2 View  Result Date: 06/23/2016 CLINICAL DATA:  Shortness of Breath EXAM: CHEST  2 VIEW COMPARISON:  June 12, 2016 FINDINGS: There remains airspace consolidation throughout much of the left lower lobe with small left effusion. There is stable elevation of the right hemidiaphragm. There is underlying mild chronic interstitial prominence without frank edema. Heart size is within normal limits. The pulmonary vascularity is normal. No adenopathy. There is degenerative change in the thoracic spine. IMPRESSION: Persistent left lower lobe airspace consolidation small left effusion. Mild  underlying interstitial prominence, probably representing chronic inflammatory type change. No new opacity. Stable cardiac silhouette. Stable elevation of the right hemidiaphragm. Electronically Signed   By: Lowella Grip III M.D.   On: 06/23/2016 07:43   Assessment/Plan 1. Acute respiratory failure with hypoxia (HCC) -due to CHF and HCAP - is using O2 at 4L [place]--will attempt to wean O2 to keep sats over 90%  2. Acute combined systolic and diastolic CHF, NYHA class 1 (HCC) -seems acute exacerbation has resolved -he's had a recent MI and refused catheterization -cont 1800 cc fluid restriction, low sodium diet, elevating feet at rest, unnaboots -torsemide, potassium, toprol  -will be interesting to see  if he is still class 1  As he is more active with rehab--doubtful to me -does have diminished breath sounds at right base and a few scattered crackles, left side clear -wt continues to trend down on current diuretics   3. HCAP (healthcare-associated pneumonia) -completed abx at hospital and no fever, chills, and denies shortness of breath  4. Alcoholic cirrhosis of liver without ascites (HCC) , -has had complications historically including GI bleeding and encephalopathy -will add ammonia to his labs given his history and current hypoactive delirium  5. Venous stasis dermatitis of both lower extremities -cont unnaboot wraps to be changed twice weekly and elevation of feet at rest  6. CKD (chronic kidney disease) stage 3, GFR 30-59 ml/min -had acute exacerbation, but renal function had improved, await f/u bmp results  7. Iron deficiency anemia due to chronic blood loss -await f/u cbc results to see where this stands  8. Memory loss -seems he's had some cognitive impairment, but this is worse now and he's got waxing and waning confusion, yelling out at night, disorientation to place, lethargy consistent with delirium -will d/c ambien and try him on remeron for his sleep (and he seems to be depressed, as well--a note from his PCP indicates his wife passed away not too long ago and he's losing his own functional abilities and seems quite frustrated about it)  9.  Subacute delirium -see #8, multifactorial:  [place] meds (ambien, xanax), recent MI and hospitalization with CHF, acute on  CKD, possibly hepatic encephalopathy due to his cirrhosis, changes to his environment, pneumonia/iinfection, hypoxia, etc. -regular cues/reorientation, keep awake in the daytime so he can sleep at night, regular hydration and po intake, regular schedule  Family/ staff Communication: discussed with rehab nurse  Labs/tests ordered:  Cbc, bmp, ammonia

## 2016-07-08 LAB — CBC AND DIFFERENTIAL
HEMATOCRIT: 52 % (ref 41–53)
Hemoglobin: 15.5 g/dL (ref 13.5–17.5)
Platelets: 359 10*3/uL (ref 150–399)
WBC: 14.9 10^3/mL

## 2016-07-08 LAB — BASIC METABOLIC PANEL
BUN: 94 mg/dL — AB (ref 4–21)
CREATININE: 2.1 mg/dL — AB (ref 0.6–1.3)
GLUCOSE: 124 mg/dL
Potassium: 4.6 mmol/L (ref 3.4–5.3)
SODIUM: 140 mmol/L (ref 137–147)

## 2016-07-09 ENCOUNTER — Encounter: Payer: Self-pay | Admitting: Adult Health

## 2016-07-09 ENCOUNTER — Non-Acute Institutional Stay (SKILLED_NURSING_FACILITY): Payer: Medicare Other | Admitting: Adult Health

## 2016-07-09 DIAGNOSIS — D72829 Elevated white blood cell count, unspecified: Secondary | ICD-10-CM | POA: Diagnosis not present

## 2016-07-09 DIAGNOSIS — I5041 Acute combined systolic (congestive) and diastolic (congestive) heart failure: Secondary | ICD-10-CM | POA: Diagnosis not present

## 2016-07-09 DIAGNOSIS — N183 Chronic kidney disease, stage 3 unspecified: Secondary | ICD-10-CM

## 2016-07-09 DIAGNOSIS — E860 Dehydration: Secondary | ICD-10-CM | POA: Diagnosis not present

## 2016-07-09 DIAGNOSIS — I872 Venous insufficiency (chronic) (peripheral): Secondary | ICD-10-CM

## 2016-07-09 NOTE — Progress Notes (Signed)
Patient ID: [name], male   DOB: [birth year], 82 y.o.   MRN: [id number]   Location:   [facility]   Place of Service:  SNF (31) Provider:   Cindi Carbon, [place] [phone]  Patient Care Team: Biagio Borg, MD as PCP - General (Internal Medicine) Hillary Bow, MD (Cardiology) Sable Feil, MD (Gastroenterology) Luberta Mutter, MD (Ophthalmology)  Extended Emergency Contact Information Primary Emergency Contact: [place] of [place] Phone: [phone] Relation: Son Secondary Emergency Contact: [place] of [place] Mobile Phone: [phone] Relation: Son  Code Status:  Full code Goals of care: Advanced Directive information Advanced Directives 07/07/2016  Does patient have an advance directive? Yes  Type of Advance Directive Out of facility DNR (pink MOST or yellow form)  Does patient want to make changes to advanced directive? -  Copy of advanced directive(s) in chart? Yes  Would patient like information on creating an advanced directive? -  Pre-existing out of facility DNR order (yellow form or pink MOST form) -  Some encounter information is confidential and restricted. Go to Review Flowsheets activity to see all data.     Chief Complaint  Patient presents with  . Acute Visit    elevated H/H, WBC, and BUN/CR    HPI:  Pt is a 82 y.o. male seen today for an acute visit for H/H of 15.5/52 on 07/08/16, WBC of 14.8, and BUN/Cr 94/2.1. He was recently discharged from the hospital due to CHF exac and HCAP.  He is on a 1500 cc FR and taking torsemide 40 mg BID.  Weight is down to 144 lbs, from 151 lbs on d/c from the hospital.  He is eating but apparently not drinking well. He was placed on contact isolation for loose stools on 8/29 but this resolved and cdiff was neg.     He completed antimicrobial therapy in the hospital for HCAP.  No further cough, sputum production, or fever have been noted.    He denies any dysuria or abd pain but feels that he is not urinating enough.   Echo on 06/15/16 shows an EF of 45-50% and grade 1 diastolic dysfunction    Past Medical History:  Diagnosis Date  . Acute combined systolic and diastolic CHF, NYHA class 1 ([name])   . Acute respiratory failure ([place])   . Alcoholic cirrhosis ([place])   . 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 Alcoholism ([place])   . Aortic stenosis   . Ascites 11/2004   "early" SBP on paracentesis.   . Chronic gastritis 05/01/2016  . CKD (chronic kidney disease), stage III 10/11/2015  . Colon polyp perhaps in 1990s   pathology/type not known  . Diverticulosis of colon perhaps in 1990s  . Duodenal ulcer 04/2016  . Fracture of wrist 2014 spring vs early summer   refused orthopods suggestion to set the bone.   Marland Kitchen GERD (gastroesophageal reflux disease)   . HLD (hyperlipidemia)   . HTN (hypertension)   . Hypertrophic cardiomyopathy ([place])   . Portal hypertensive gastropathy 2006  . Renal insufficiency   . Rhabdomyolysis 01/2005   Past Surgical History:  Procedure Laterality Date  . BASAL CELL CARCINOMA EXCISION Left 11/2012   lower eyelid  . ESOPHAGOGASTRODUODENOSCOPY N/A 08/04/2013   Procedure: ESOPHAGOGASTRODUODENOSCOPY (EGD);  Surgeon: Ladene Artist, MD;  Location: [facility] ENDOSCOPY;  Service: Endoscopy;  Laterality: N/A;  . ESOPHAGOGASTRODUODENOSCOPY (EGD) WITH PROPOFOL N/A 05/01/2016   Procedure: ESOPHAGOGASTRODUODENOSCOPY (EGD) WITH PROPOFOL;  Surgeon:  Jerene Bears, MD;  Location: [facility] ENDOSCOPY;  Service: Endoscopy;  Laterality: N/A;  . INGUINAL HERNIA REPAIR    . MELANOMA EXCISION Left 02/2011   malignant melanoma of thigh    No Known Allergies    Medication List       Accurate as of 07/09/16 11:00 AM. Always use your most recent med list.          acetaminophen 325 MG tablet Commonly known as:  TYLENOL Take 650 mg by mouth every 6 (six) hours as needed for pain.   ALPRAZolam 0.25 MG tablet Commonly known as:   XANAX Take 1 tablet (0.25 mg total) by mouth at bedtime as needed for anxiety.   aspirin 81 MG EC tablet Take 1 tablet (81 mg total) by mouth daily.   clopidogrel 75 MG tablet Commonly known as:  PLAVIX Take 1 tablet (75 mg total) by mouth daily.   isosorbide mononitrate 30 MG 24 hr tablet Commonly known as:  IMDUR Take 30 mg by mouth daily.   metoprolol succinate 25 MG 24 hr tablet Commonly known as:  TOPROL-XL Take 3 tablets (75 mg total) by mouth daily. Take with or immediately following a meal.   mirtazapine 7.5 MG tablet Commonly known as:  REMERON Take 7.5 mg by mouth at bedtime.   multivitamin with minerals Tabs tablet Take 1 tablet by mouth daily.   pantoprazole 40 MG tablet Commonly known as:  PROTONIX Take 1 tablet (40 mg total) by mouth 2 (two) times daily.   Potassium Chloride ER 20 MEQ Tbcr Take 20 mEq by mouth 2 (two) times daily.   simvastatin 20 MG tablet Commonly known as:  ZOCOR Take 20 mg by mouth daily.   torsemide 20 MG tablet Commonly known as:  DEMADEX Take 2 tablets (40 mg total) by mouth 2 (two) times daily.       Review of Systems  Constitutional: Negative for activity change, appetite change, chills, diaphoresis, fatigue, fever and unexpected weight change.  Respiratory: Negative for cough, shortness of breath, wheezing and stridor.   Cardiovascular: Positive for leg swelling. Negative for chest pain and palpitations.  Gastrointestinal: Negative for abdominal distention, abdominal pain, constipation and diarrhea.  Genitourinary: Negative for difficulty urinating and dysuria.  Musculoskeletal: Positive for gait problem. Negative for arthralgias, back pain, joint swelling and myalgias.  Neurological: Negative for dizziness, seizures, syncope, facial asymmetry, speech difficulty, weakness and headaches.  Hematological: Negative for adenopathy. Does not bruise/bleed easily.  Psychiatric/Behavioral: Negative for agitation, behavioral problems  and confusion.    Immunization History  Administered Date(s) Administered  . Influenza Whole 08/23/2008, 08/17/2012  . Influenza,inj,Quad PF,36+ Mos 08/23/2014, 08/27/2015  . Pneumococcal Conjugate-13 06/14/2014  . Tetanus 06/14/2014  . Zoster 07/13/2008   Pertinent  Health Maintenance Due  Topic Date Due  . PNA vac Low Risk Adult (2 of 2 - PPSV23) 06/15/2015  . INFLUENZA VACCINE  06/09/2016   Fall Risk  02/22/2015 08/23/2014 06/14/2014  Falls in the past year? No No No  Risk for fall due to : - - Impaired balance/gait  Risk for fall due to (comments): - - uses cane for mobility   Functional Status Survey:    Vitals:   07/09/16 1035  BP: 126/69  Pulse: 83  Resp: 20  Temp: 97.9 F (36.6 C)  SpO2: 93%  Weight: 144 lb (65.3 kg)   Body mass index is 21.9 kg/m. Physical Exam  Constitutional: He is oriented to person, place, and time. No distress.  HENT:  Head: Normocephalic and atraumatic.  Neck: No JVD present.  Cardiovascular: Normal rate and regular rhythm.   Murmur heard. BLE edema +1  Pulmonary/Chest: Effort normal. No respiratory distress. He has rales (bibasilar).  Abdominal: Soft. Bowel sounds are normal. He exhibits no distension. There is no tenderness.  Neurological: He is alert and oriented to person, place, and time.  Skin: Skin is warm and dry. He is not diaphoretic.  Unna boots wrapped to both legs  Psychiatric: He has a normal mood and affect.  Nursing note and vitals reviewed.   Labs reviewed:  Recent Labs  06/27/16 0300  06/30/16 0508 07/01/16 0423 07/02/16 0542 07/08/16  NA 141  < > 138 138 138 140  K 3.8  < > 3.6 3.4* 3.7 4.6  CL 109  < > 98* 97* 97*  --   CO2 21*  < > 30 32 32  --   GLUCOSE 132*  < > 137* 125* 107*  --   BUN 57*  < > 76* 81* 80* 94*  CREATININE 2.84*  < > 2.67* 2.72* 2.36* 2.1*  CALCIUM 8.6*  < > 8.5* 8.5* 8.8*  --   MG 1.8  --  1.6*  --  1.9  --   < > = values in this interval not displayed.  Recent Labs   04/13/16 1046 04/30/16 0422 06/12/16 0556  AST 21 19 24   ALT 17 15* 15*  ALKPHOS 75 63 85  BILITOT 0.7 0.6 0.6  PROT 6.7 5.5* 6.0*  ALBUMIN 3.5 2.7* 3.0*    Recent Labs  05/28/16 1556 06/12/16 0556 06/23/16 0705  06/27/16 0300 06/28/16 0325 06/29/16 0554 07/08/16  WBC 9.7 8.3 16.2*  < > 11.1* 10.0 9.8 14.9  NEUTROABS 7.5 5.8 12.1*  --   --   --   --   --   HGB 11.6* 10.3* 11.1*  < > 9.6* 9.7* 9.7* 15.5  HCT 35.3* 33.3* 35.2*  < > 31.1* 31.7* 31.4* 52  MCV 93.9 99.7 100.0  < > 99.4 99.1 99.1  --   PLT 234.0 229 369  < > 245 239 208 359  < > = values in this interval not displayed. Lab Results  Component Value Date   TSH 0.74 10/23/2015   Lab Results  Component Value Date   HGBA1C 5.7 02/22/2015   Lab Results  Component Value Date   CHOL 93 04/30/2016   HDL 32 (L) 04/30/2016   LDLCALC 35 04/30/2016   TRIG 129 04/30/2016   CHOLHDL 2.9 04/30/2016    Significant Diagnostic Results in last 30 days:  Dg Chest 2 View  Result Date: 06/24/2016 CLINICAL DATA:  Acute CHF, shortness of breath, cardiomyopathy, cirrhosis, chronic renal insufficiency EXAM: CHEST  2 VIEW COMPARISON:  PA and lateral chest x-ray of June 23, 2016 FINDINGS: There remains volume loss on the right with elevation of the hemidiaphragm. The left lung is adequately inflated. Coarse interstitial markings are present in the lower left lung. There is partial obscuration of the hemidiaphragm. The cardiac silhouette is enlarged. The pulmonary vascularity is mildly prominent centrally. IMPRESSION: Allowing for differences in positioning there has not been significant interval change since yesterday's study. Persistent left basilar atelectasis or pneumonia with trace left pleural effusion. Stable cardiomegaly. Electronically Signed   By: David  Martinique M.D.   On: 06/24/2016 07:52   Dg Chest 2 View  Result Date: 06/23/2016 CLINICAL DATA:  Shortness of Breath EXAM: CHEST  2 VIEW COMPARISON:  June 12, 2016  FINDINGS:  There remains airspace consolidation throughout much of the left lower lobe with small left effusion. There is stable elevation of the right hemidiaphragm. There is underlying mild chronic interstitial prominence without frank edema. Heart size is within normal limits. The pulmonary vascularity is normal. No adenopathy. There is degenerative change in the thoracic spine. IMPRESSION: Persistent left lower lobe airspace consolidation small left effusion. Mild underlying interstitial prominence, probably representing chronic inflammatory type change. No new opacity. Stable cardiac silhouette. Stable elevation of the right hemidiaphragm. Electronically Signed   By: Lowella Grip III M.D.   On: 06/23/2016 07:43   Dg Chest 2 View  Result Date: 06/12/2016 CLINICAL DATA:  82 year old male with chest pain and shortness of breath EXAM: CHEST  2 VIEW COMPARISON:  Chest radiograph dated 05/28/2016 FINDINGS: Shallow inspiration. There is eventration of the right hemidiaphragm with atelectatic changes of the right lung base. Patchy area of hazy density at the left lung base may represent atelectasis versus infiltrate. There is no pneumothorax. Stable enlarged cardiac silhouette. No acute osseous pathology. IMPRESSION: Patchy left lung base airspace opacity, atelectasis versus infiltrate. Clinical correlation and follow-up recommended. Electronically Signed   By: Anner Crete M.D.   On: 06/12/2016 06:47   Nm Myocar Multi W/spect W/wall Motion / Ef  Result Date: 06/13/2016 CLINICAL DATA:  82 year old male with chest pain hypertension was short of breath. EXAM: MYOCARDIAL IMAGING WITH SPECT (REST AND PHARMACOLOGIC-STRESS) GATED LEFT VENTRICULAR WALL MOTION STUDY LEFT VENTRICULAR EJECTION FRACTION TECHNIQUE: Standard myocardial SPECT imaging was performed after resting intravenous injection of 10 mCi Tc-21m tetrofosmin. Subsequently, intravenous infusion of Lexiscan was performed under the supervision of the Cardiology  staff. At peak effect of the drug, 30 mCi Tc-50m tetrofosmin was injected intravenously and standard myocardial SPECT imaging was performed. Quantitative gated imaging was also performed to evaluate left ventricular wall motion, and estimate left ventricular ejection fraction. COMPARISON:  None. FINDINGS: Perfusion: There is a large region of decreased profusion involving the apex, anterior, mid, and basilar segments of the inferior wall, septal wall and the anterior wall. This large defect is matched on rest and stress. Preserved profusion to the lateral wall. Wall Motion:  Septal dyskinesia. Left Ventricular Ejection Fraction: 38 % End diastolic volume 123XX123 ml End systolic volume 94 ml IMPRESSION: 1. Large region of infarcted myocardium involving the apex, inferior wall, septal wall and anterior wall. No reversible ischemia. 2. Septal dyskinesia. 3. Left ventricular ejection fraction 38% 4. Non invasive risk stratification*: Intermediate owing to LEFT ventricular dysfunction. *2012 Appropriate Use Criteria for Coronary Revascularization Focused Update: J Am Coll Cardiol. [id number]. http://content.airportbarriers.com.aspx?articleid=[email or web address] Electronically Signed   By: Suzy Bouchard M.D.   On: 06/13/2016 13:57   Dg Chest Port 1 View  Result Date: 06/27/2016 CLINICAL DATA:  HCAP (healthcare associated pna) CHF, CHF exacerbation. No complaints per pt this am as pt was just waking up and said he couldn't tell how he felt just yet. EXAM: PORTABLE CHEST 1 VIEW COMPARISON:  06/24/2016 FINDINGS: Patchy bilateral airspace lung opacities are without substantial change from the prior exam allowing for differences in patient positioning. Elevation right hemidiaphragm is stable. There are probable small pleural effusions. No pneumothorax. Cardiac silhouette is partly obscured but grossly normal in size. IMPRESSION: 1. No significant change in the bilateral lower lung zone patchy airspace opacities. This may  reflect multifocal pneumonia or asymmetric pulmonary edema. Electronically Signed   By: Lajean Manes M.D.   On: 06/27/2016 07:31    Assessment/Plan 1. Dehydration BUN up  to 94 from 80. He has a normal mental status and is willing to drink fluid to help with this and the heme concentration noted on his labs.  Increase FR to 2000 cc per day.  Recheck BMP, CBC in am  2. CKD (chronic kidney disease) stage 3, GFR 30-59 ml/min Cr better  See above  3. Acute combined systolic and diastolic CHF, NYHA class 1 (HCC Improved weight and no sob or cp Continue daily weights Decrease torsemide to 20 mg qd today and then recheck labs in am Will need to be monitored closely to avoid fluid overload  4. Chronic venous insufficiency Continue Unna boots  5. Leukocytosis Asymptomatic Recheck in the am Monitor VS qshift   Family/ staff Communication: discussed with resident and staff  Labs/tests ordered:  CBC, BMP in am

## 2016-07-10 LAB — CBC AND DIFFERENTIAL
HEMATOCRIT: 34 % — AB (ref 41–53)
Hemoglobin: 10.4 g/dL — AB (ref 13.5–17.5)
Platelets: 257 10*3/uL (ref 150–399)
WBC: 9.5 10^3/mL

## 2016-07-11 LAB — BASIC METABOLIC PANEL
BUN: 99 mg/dL — AB (ref 4–21)
Creatinine: 2.6 mg/dL — AB (ref 0.6–1.3)
GLUCOSE: 140 mg/dL
POTASSIUM: 4.5 mmol/L (ref 3.4–5.3)
SODIUM: 136 mmol/L — AB (ref 137–147)

## 2016-07-15 ENCOUNTER — Telehealth (HOSPITAL_COMMUNITY): Payer: Self-pay | Admitting: *Deleted

## 2016-07-15 ENCOUNTER — Ambulatory Visit (HOSPITAL_COMMUNITY)
Admission: RE | Admit: 2016-07-15 | Discharge: 2016-07-15 | Disposition: A | Discharge status: Home / Self Care | Payer: Medicare Other | Source: Ambulatory Visit | Attending: Cardiology | Admitting: Cardiology

## 2016-07-15 VITALS — BP 138/54 | HR 84 | Wt 151.4 lb

## 2016-07-15 DIAGNOSIS — I421 Obstructive hypertrophic cardiomyopathy: Secondary | ICD-10-CM | POA: Diagnosis not present

## 2016-07-15 DIAGNOSIS — I2510 Atherosclerotic heart disease of native coronary artery without angina pectoris: Secondary | ICD-10-CM | POA: Diagnosis not present

## 2016-07-15 DIAGNOSIS — K7031 Alcoholic cirrhosis of liver with ascites: Secondary | ICD-10-CM | POA: Insufficient documentation

## 2016-07-15 DIAGNOSIS — N183 Chronic kidney disease, stage 3 unspecified: Secondary | ICD-10-CM

## 2016-07-15 DIAGNOSIS — I350 Nonrheumatic aortic (valve) stenosis: Secondary | ICD-10-CM | POA: Diagnosis not present

## 2016-07-15 DIAGNOSIS — Z801 Family history of malignant neoplasm of trachea, bronchus and lung: Secondary | ICD-10-CM | POA: Insufficient documentation

## 2016-07-15 DIAGNOSIS — K219 Gastro-esophageal reflux disease without esophagitis: Secondary | ICD-10-CM | POA: Insufficient documentation

## 2016-07-15 DIAGNOSIS — I130 Hypertensive heart and chronic kidney disease with heart failure and stage 1 through stage 4 chronic kidney disease, or unspecified chronic kidney disease: Secondary | ICD-10-CM | POA: Diagnosis not present

## 2016-07-15 DIAGNOSIS — I5022 Chronic systolic (congestive) heart failure: Secondary | ICD-10-CM | POA: Diagnosis not present

## 2016-07-15 DIAGNOSIS — I872 Venous insufficiency (chronic) (peripheral): Secondary | ICD-10-CM

## 2016-07-15 DIAGNOSIS — Z8249 Family history of ischemic heart disease and other diseases of the circulatory system: Secondary | ICD-10-CM | POA: Diagnosis not present

## 2016-07-15 DIAGNOSIS — Z7982 Long term (current) use of aspirin: Secondary | ICD-10-CM | POA: Diagnosis not present

## 2016-07-15 DIAGNOSIS — K269 Duodenal ulcer, unspecified as acute or chronic, without hemorrhage or perforation: Secondary | ICD-10-CM | POA: Insufficient documentation

## 2016-07-15 DIAGNOSIS — K635 Polyp of colon: Secondary | ICD-10-CM | POA: Insufficient documentation

## 2016-07-15 DIAGNOSIS — K7030 Alcoholic cirrhosis of liver without ascites: Secondary | ICD-10-CM | POA: Diagnosis not present

## 2016-07-15 DIAGNOSIS — I5042 Chronic combined systolic (congestive) and diastolic (congestive) heart failure: Secondary | ICD-10-CM | POA: Diagnosis present

## 2016-07-15 DIAGNOSIS — R413 Other amnesia: Secondary | ICD-10-CM

## 2016-07-15 DIAGNOSIS — F1020 Alcohol dependence, uncomplicated: Secondary | ICD-10-CM | POA: Insufficient documentation

## 2016-07-15 DIAGNOSIS — I5043 Acute on chronic combined systolic (congestive) and diastolic (congestive) heart failure: Secondary | ICD-10-CM | POA: Insufficient documentation

## 2016-07-15 DIAGNOSIS — K3189 Other diseases of stomach and duodenum: Secondary | ICD-10-CM | POA: Diagnosis not present

## 2016-07-15 DIAGNOSIS — I422 Other hypertrophic cardiomyopathy: Secondary | ICD-10-CM | POA: Diagnosis not present

## 2016-07-15 DIAGNOSIS — E785 Hyperlipidemia, unspecified: Secondary | ICD-10-CM | POA: Insufficient documentation

## 2016-07-15 DIAGNOSIS — K766 Portal hypertension: Secondary | ICD-10-CM | POA: Diagnosis not present

## 2016-07-15 DIAGNOSIS — G312 Degeneration of nervous system due to alcohol: Secondary | ICD-10-CM | POA: Diagnosis not present

## 2016-07-15 DIAGNOSIS — I4510 Unspecified right bundle-branch block: Secondary | ICD-10-CM | POA: Diagnosis not present

## 2016-07-15 LAB — BASIC METABOLIC PANEL
ANION GAP: 9 (ref 5–15)
BUN: 87 mg/dL — ABNORMAL HIGH (ref 6–20)
CALCIUM: 9 mg/dL (ref 8.9–10.3)
CO2: 24 mmol/L (ref 22–32)
CREATININE: 2.17 mg/dL — AB (ref 0.61–1.24)
Chloride: 104 mmol/L (ref 101–111)
GFR calc Af Amer: 30 mL/min — ABNORMAL LOW (ref >60)
GFR, EST NON AFRICAN AMERICAN: 26 mL/min — AB (ref >60)
GLUCOSE: 147 mg/dL — AB (ref 65–99)
Potassium: 4.6 mmol/L (ref 3.5–5.1)
Sodium: 137 mmol/L (ref 135–145)

## 2016-07-15 LAB — MAGNESIUM: Magnesium: 1.9 mg/dL (ref 1.7–2.4)

## 2016-07-15 MED ORDER — TORSEMIDE 20 MG PO TABS: 20.0000 mg | ORAL_TABLET | Freq: Every day | ORAL | Status: DC

## 2016-07-15 NOTE — Progress Notes (Signed)
Advanced Heart Failure Clinic Note   Primary Care: [name], MD Primary Cardiologist: Dr. Angelena Form HF: Dr. 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  Evaluation revealed findings of a non-STEMI secondary to demand ischemia in setting of chronic kidney disease. Myoview 06/13/16 showed evidence of apparent prior infarct without reversible ischemia.  Echo 06/15/16 LVEF 45-50% with Grade 1 DD. Mild AS + AI.   Treated for cellulitis in ER 06/17/16.  Admitted 8/15 - 07/02/16 with worsening SOB and peripheral edema. Pertinent labs on admission include K 4.7, Creatinine 1.84, BNP 1426. WBC 16.2 and lactic acid 0.59. UA unremarkable. BCx and UCx NGTD as of 06/25/16. CXR with persistent left lower lobe consolidation with mild underlying interstitial prominence. Received dose of vanc/zosyn, then finished course of doxy. HF team consulted with anasarca and poor diuresis.  Diuresed on IV lasix and milrinone added to augment diuresis.  Diuretics eventually held with AKI. Changed from po lasix to torsemide on d/c. Diuresed 24 lbs. Discharge weight 144 lbs.   He presents today for post hospital follow up.  Weight up 7 lbs from discharge by our scales.  Taking torsemide 40 mg BID. Labs from SNF as below. States he has been in SNF rehab, feels like he is progressing.  Wants to go back to assisted living portion. Breathing is "normal" for him.  Denies DOE with bathing or changing clothes. "SOB is not a problem for me". Denies CP, lightheadedness, or dizziness.   Labs  8/30 K 4.6, Cr 2.1, BUN 94.1 9/1 K 5.0, Cr 2.1, BUN 95 9/2 K 4.5, Cr 2.59, BUN 98.9   Past Medical History:  Diagnosis Date    . Acute combined systolic and diastolic CHF, NYHA class 1 ([place])   . Acute respiratory failure ([place])   . Alcoholic cirrhosis ([place])   . 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 Alcoholism ([place])   . Aortic stenosis   . Ascites 11/2004   "early" SBP on paracentesis.   . Chronic gastritis 05/01/2016  . CKD (chronic kidney disease), stage III 10/11/2015  . Colon polyp perhaps in 1990s   pathology/type not known  . Diverticulosis of colon perhaps in 1990s  . Duodenal ulcer 04/2016  . Fracture of wrist 2014 spring vs early summer   refused orthopods suggestion to set the bone.   Marland Kitchen GERD (gastroesophageal reflux disease)   . HLD (hyperlipidemia)   . HTN (hypertension)   . Hypertrophic cardiomyopathy ([place])   . Portal hypertensive gastropathy 2006  . Renal insufficiency   . Rhabdomyolysis 01/2005    Current Outpatient Prescriptions  Medication Sig Dispense Refill  . acetaminophen (TYLENOL) 325 MG tablet Take 650 mg by mouth every 6 (six) hours as needed for pain.    Marland Kitchen ALPRAZolam (XANAX) 0.25 MG tablet Take 1 tablet (0.25 mg total) by mouth at bedtime as needed for anxiety. 10 tablet 0  . aspirin EC 81 MG EC tablet Take 1 tablet (81 mg total) by mouth daily. 30 tablet 0  . clopidogrel (PLAVIX) 75 MG tablet Take 1 tablet (75 mg total) by mouth daily. 30 tablet 0  . isosorbide mononitrate (IMDUR) 30 MG  24 hr tablet Take 30 mg by mouth daily.    . metoprolol succinate (TOPROL-XL) 25 MG 24 hr tablet Take 3 tablets (75 mg total) by mouth daily. Take with or immediately following a meal. 90 tablet 0  . Multiple Vitamin (MULTIVITAMIN WITH MINERALS) TABS Take 1 tablet by mouth daily.    . pantoprazole (PROTONIX) 40 MG tablet Take 1 tablet (40 mg total) by mouth 2 (two) times daily. 60 tablet 0  . potassium chloride 20 MEQ TBCR Take 20 mEq by mouth 2 (two) times daily.    . simvastatin (ZOCOR) 20 MG tablet Take 20 mg by mouth daily.    Marland Kitchen torsemide (DEMADEX) 20 MG tablet Take 2 tablets (40 mg total) by  mouth 2 (two) times daily.    Marland Kitchen zolpidem (AMBIEN) 5 MG tablet Take 5 mg by mouth at bedtime as needed for sleep.     No current facility-administered medications for this encounter.     No Known Allergies    Social History   Social History  . Marital status: Married    Spouse name: N/A  . Number of children: 4  . Years of education: 24   Occupational History  . pathologist Retired    retired   Social History Main Topics  . Smoking status: Never Smoker  . Smokeless tobacco: Never Used  . Alcohol use No     Comment: member of AA  . Drug use: No  . Sexual activity: Not on file   Other Topics Concern  . 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  Remains independent. ACP- not discussed, will approach at next OV      Family History  Problem Relation Age of Onset  . Cancer Father 23    lung cancer  . Heart disease Mother   . Heart disease Sister     Vitals:   07/15/16 1010  BP: (!) 138/54  Pulse: 84  SpO2: 97%  Weight: 151 lb 6.4 oz (68.7 kg)     Wt Readings from Last 3 Encounters:  07/15/16 151 lb 6.4 oz (68.7 kg)  07/09/16 144 lb (65.3 kg)  07/07/16 146 lb (66.2 kg)     PHYSICAL EXAM: General:  Elderly appearing.  HEENT: normal Neck: supple. JVP 7-8 cm. Carotids 2+ bilat; no bruits. No thyromegaly or nodule noted. Epidermal cyst left neck. Cor: PMI nondisplaced. RRR. No rubs, gallops. 2/6 AS murmur. Lungs: Basilar crackles, to middle lobe R side.  Abdomen: soft, NT, ND, no HSM. No bruits or masses. +BS  Extremities: no cyanosis, clubbing, rash. UNNA boots in place. Edema much improved.  Neuro: A&O 3, cranial nerves grossly intact. moves all 4 extremities w/o difficulty. Affect pleasant  ASSESSMENT & PLAN:   1. Chronic combined CHF, ICM: EF ~40-45% with mid to distal anterior, anteroseptal, and apical HK 2. CAD with previous silent LAD infarct 3. Aortic stenosis, Mild 4. Alcoholic Cirrhosis 5.  AKI on CKD stage 3 6. RBBB  BMET and Mg today. If creatinine remains elevated will cut back diuretics to Torsemide 40/20 BID.  Creatinine remains elevated on recent labwork at facility. Will repeat BMET today.   Do not recommend he return to Independent Living portion, suspect he will at least require ALF.  Further per SNF and rehab recommendations.   Follow up 4-6 weeks with MD.   Beryle Beams" [place], PA-C 07/15/2016 10:33 AM   ADDENDUM  SNF nurse called and provider  there had already decreased torsemide due to elevated Creatinine listed on labwork above.  Pt currently taking 20 mg torsemide DAILY.    Should keep track of daily weights and increase to 40 mg daily as needed.   Legrand Como [address] [place], PA-C 07/15/2016 2:14 PM   Patient seen and examined with Oda Kilts, PA-C. We discussed all aspects of the encounter. I agree with the assessment and plan as stated above.   Overall volume status look very good on torsemide. Labs reviewed. Torsemide cut back to 20mg  daily. I suspect he will need more than this particularly given advanced CKD. May need 40 daily or alternating 40 daily with 20 daily. Will continue to follow closely. Spoke with his son in Clinic as well.   Bensimhon, Daniel,MD 12:56 AM

## 2016-07-15 NOTE — Telephone Encounter (Signed)
RN from [place] called to let us know that his Torsemide is already bumped down to 20 mg daily.  Updated med list. Jonni Sanger aware.

## 2016-07-15 NOTE — Patient Instructions (Signed)
Your physician recommends that you schedule a follow-up appointment in: 4-6 weeks with Dr Haroldine Laws

## 2016-07-15 NOTE — Progress Notes (Signed)
Advanced Heart Failure Medication Review by a Pharmacist  Does the patient  feel that his/her medications are working for him/her?  yes  Has the patient been experiencing any side effects to the medications prescribed?  no  Does the patient measure his/her own blood pressure or blood glucose at home?  no   Does the patient have any problems obtaining medications due to transportation or finances?   no  Understanding of regimen: fair Understanding of indications: fair Potential of compliance: good Patient understands to avoid NSAIDs. Patient understands to avoid decongestants.  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 No significant discrepancies noted.   Ruta Hinds. Velva Harman, PharmD, BCPS, CPP Clinical Pharmacist Pager: [phone] Phone: [phone] 07/15/2016 10:29 AM      Time with patient: 2 minutes Preparation and documentation time: 10 minutes Total time: 12 minutes

## 2016-07-17 ENCOUNTER — Telehealth (HOSPITAL_COMMUNITY): Payer: Self-pay | Admitting: Cardiology

## 2016-07-17 ENCOUNTER — Telehealth (HOSPITAL_COMMUNITY): Payer: Self-pay | Admitting: *Deleted

## 2016-07-17 MED ORDER — METOPROLOL SUCCINATE ER 25 MG PO TB24: 50.0000 mg | ORAL_TABLET | Freq: Every day | ORAL | 0 refills | Status: DC

## 2016-07-17 NOTE — Telephone Encounter (Signed)
Order faxed to [facility] to decrease 50 mg of metoprolol  Med list reviewed and medication reconciliation done

## 2016-07-17 NOTE — Telephone Encounter (Signed)
Kim called to clarify Torsemide dose, she reports the papers she received from our office showed Torsemide dose 40 mg BID and the faxed order she received said to continue current Torsemide dose, however pt has been on 20 mg daily since 07/09/16.  Per note from Oda Kilts, [place]:  Pt currently taking 20 mg torsemide DAILY.    Should keep track of daily weights and increase to 40 mg daily as needed.   Kim aware and agreeable

## 2016-07-20 ENCOUNTER — Non-Acute Institutional Stay (SKILLED_NURSING_FACILITY): Payer: Medicare Other | Admitting: Adult Health

## 2016-07-20 ENCOUNTER — Encounter: Payer: Self-pay | Admitting: Student

## 2016-07-20 DIAGNOSIS — E785 Hyperlipidemia, unspecified: Secondary | ICD-10-CM

## 2016-07-20 DIAGNOSIS — J189 Pneumonia, unspecified organism: Secondary | ICD-10-CM | POA: Diagnosis not present

## 2016-07-20 DIAGNOSIS — I2510 Atherosclerotic heart disease of native coronary artery without angina pectoris: Secondary | ICD-10-CM | POA: Diagnosis not present

## 2016-07-20 DIAGNOSIS — I5042 Chronic combined systolic (congestive) and diastolic (congestive) heart failure: Secondary | ICD-10-CM | POA: Diagnosis not present

## 2016-07-20 DIAGNOSIS — R413 Other amnesia: Secondary | ICD-10-CM

## 2016-07-20 DIAGNOSIS — N183 Chronic kidney disease, stage 3 unspecified: Secondary | ICD-10-CM

## 2016-07-20 DIAGNOSIS — D500 Iron deficiency anemia secondary to blood loss (chronic): Secondary | ICD-10-CM | POA: Diagnosis not present

## 2016-07-20 DIAGNOSIS — G4700 Insomnia, unspecified: Secondary | ICD-10-CM | POA: Diagnosis not present

## 2016-07-20 DIAGNOSIS — I872 Venous insufficiency (chronic) (peripheral): Secondary | ICD-10-CM

## 2016-07-20 DIAGNOSIS — K7030 Alcoholic cirrhosis of liver without ascites: Secondary | ICD-10-CM

## 2016-07-20 NOTE — Progress Notes (Signed)
Patient ID: [name], male   DOB: 1929-06-10, 82 y.o.   MRN: [id number]   Location:   [facility]   Place of Service:  SNF [phone])  Provider: Royal Hawthorn NP  PCP: [name], MD Patient Care Team: Biagio Borg, MD as PCP - General (Internal Medicine) Hillary Bow, MD (Cardiology) Sable Feil, MD (Gastroenterology) Luberta Mutter, MD (Ophthalmology)  Extended Emergency Contact Information Primary Emergency Contact: [place] Phone: [phone] Relation: Son Secondary Emergency Contact: [place] of [place] Mobile Phone: [phone] Relation: Son  Code Status: Full Code Goals of care:  Advanced Directive information Advanced Directives 07/07/2016  Does patient have an advance directive? Yes  Type of Advance Directive Out of facility DNR (pink MOST or yellow form)  Does patient want to make changes to advanced directive? -  Copy of advanced directive(s) in chart? Yes  Would patient like information on creating an advanced directive? -  Pre-existing out of facility DNR order (yellow form or pink MOST form) -  Some encounter information is confidential and restricted. Go to Review Flowsheets activity to see all data.     No Known Allergies  Chief Complaint  Patient presents with  . Discharge Note    HPI:  82 y.o. male. He was seen in ED on 8/15 and admitted to the hospital for CHF exac and HCAP. He was d/c'ed on 8/24. He was admitted to the rehab unit for physical therapy.  Labs during his stay were drawn H/H 15.5/52 on 07/08/16, WBC of 14.8, and BUN/Cr 94/2.1. He was on a FR of 1500 cc and this was increased to 1800cc and his torsemide dose was decreased to 20 mg daily on 9/1. Last BUN/creat 87/2.17 on 9/2. WBC trended down to 9.5 and H/H returned to baseline at 10.4/34. He has a pending BMP scheduled for today. His current weight is 146 lb and remains stable. He has had issues taking in enough fluid to  meet the fluid restriction.   He has completed his course of doxy for HCAP. He had episodes of hypoxia with O2 sats 77% with exertion but normal 02 sats at rest on RA. He states his shortness of breath has improved and he is able to get up and go the bathroom without resp distress. He was able to ambulate about 50 ft with PT with adequate oxygenation and no dyspnea on exertion. He does not need home O2 at this time.  Unna boots bilat were d/c'ed on 9/8 and an order for compression hose was placed. He has scaly plaques to LE.  He reports that he had a BM this morning. Nursing staff states that he is eating and drinking. Home caregivers and medical management have been set up for his discharge.     Past Medical History:  Diagnosis Date  . Acute combined systolic and diastolic CHF, NYHA class 1 ([place])   . Acute respiratory failure ([place])   . Alcoholic cirrhosis ([place])   . 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 Alcoholism ([place])   . Aortic stenosis   . Ascites 11/2004   "early" SBP on paracentesis.   . Chronic gastritis 05/01/2016  . CKD (chronic kidney disease), stage III 10/11/2015  . Colon polyp perhaps in 1990s   pathology/type not known  . Diverticulosis of colon perhaps in 1990s  . Duodenal ulcer 04/2016  . Fracture of wrist 2014 spring vs early summer   refused orthopods suggestion to set the bone.   Marland Kitchen  GERD (gastroesophageal reflux disease)   . HLD (hyperlipidemia)   . HTN (hypertension)   . Hypertrophic cardiomyopathy ([place])   . Portal hypertensive gastropathy 2006  . Renal insufficiency   . Rhabdomyolysis 01/2005    Past Surgical History:  Procedure Laterality Date  . BASAL CELL CARCINOMA EXCISION Left 11/2012   lower eyelid  . ESOPHAGOGASTRODUODENOSCOPY N/A 08/04/2013   Procedure: ESOPHAGOGASTRODUODENOSCOPY (EGD);  Surgeon: Ladene Artist, MD;  Location: [facility] ENDOSCOPY;  Service: Endoscopy;  Laterality: N/A;  . ESOPHAGOGASTRODUODENOSCOPY (EGD) WITH PROPOFOL N/A 05/01/2016    Procedure: ESOPHAGOGASTRODUODENOSCOPY (EGD) WITH PROPOFOL;  Surgeon: Jerene Bears, MD;  Location[facility];  Service: Endoscopy;  Laterality: N/A;  . INGUINAL HERNIA REPAIR    . MELANOMA EXCISION Left 02/2011   malignant melanoma of thigh      reports that he has never smoked. He has never used smokeless tobacco. He reports that he does not drink alcohol or use drugs. Social History   Social History  . Marital status: Married    Spouse name: N/A  . Number of children: 4  . Years of education: 24   Occupational History  . pathologist Retired    retired   Social History Main Topics  . Smoking status: Never Smoker  . Smokeless tobacco: Never Used  . Alcohol use No     Comment: member of AA  . Drug use: No  . Sexual activity: Not on file   Other Topics Concern  . 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  Remains independent. ACP- not discussed, will approach at next OV   Functional Status Survey:    No Known Allergies  Pertinent  Health Maintenance Due  Topic Date Due  . PNA vac Low Risk Adult (2 of 2 - PPSV23) 06/15/2015  . INFLUENZA VACCINE  06/09/2016    Medications:   Medication List       Accurate as of 07/20/16 10:35 AM. Always use your most recent med list.          acetaminophen 325 MG tablet Commonly known as:  TYLENOL Take 650 mg by mouth every 6 (six) hours as needed for pain.   ALPRAZolam 0.25 MG tablet Commonly known as:  XANAX Take 1 tablet (0.25 mg total) by mouth at bedtime as needed for anxiety.   aspirin 81 MG EC tablet Take 1 tablet (81 mg total) by mouth daily.   clopidogrel 75 MG tablet Commonly known as:  PLAVIX Take 1 tablet (75 mg total) by mouth daily.   isosorbide mononitrate 30 MG 24 hr tablet Commonly known as:  IMDUR Take 30 mg by mouth daily.   metoprolol succinate 25 MG 24 hr tablet Commonly known as:  TOPROL-XL Take 2 tablets (50 mg total) by mouth daily. Take  with or immediately following a meal.   multivitamin with minerals Tabs tablet Take 1 tablet by mouth daily.   pantoprazole 40 MG tablet Commonly known as:  PROTONIX Take 1 tablet (40 mg total) by mouth 2 (two) times daily.   Potassium Chloride ER 20 MEQ Tbcr Take 20 mEq by mouth 2 (two) times daily.   REMERON 15 MG tablet Generic drug:  mirtazapine Take 7.5 mg by mouth at bedtime.   simvastatin 20 MG tablet Commonly known as:  ZOCOR Take 20 mg by mouth daily.   torsemide 20 MG tablet Commonly known as:  DEMADEX Take 1 tablet (20 mg total) by mouth daily.  Review of Systems  Constitutional: Negative for activity change, appetite change, diaphoresis and fatigue.  HENT: Negative for congestion.   Eyes: Negative.   Respiratory: Negative for apnea, cough, shortness of breath and wheezing.   Cardiovascular: Positive for leg swelling. Negative for chest pain and palpitations.  Gastrointestinal: Negative for abdominal distention, abdominal pain, diarrhea and vomiting.  Genitourinary: Negative.   Musculoskeletal: Negative.   Skin: Negative for rash.  Neurological: Negative.   Psychiatric/Behavioral: Negative.    BP rechecked later at 130/72 Vitals:   07/20/16 1007  BP: (!) 161/82  Pulse: 80  SpO2: 95%  Weight: 146 lb (66.2 kg)   Body mass index is 22.2 kg/m. Physical Exam  Constitutional: He is oriented to person, place, and time. He appears well-developed and well-nourished.  HENT:  Head: Normocephalic and atraumatic.  Mouth/Throat: Oropharynx is clear and moist.  Eyes: EOM are normal. Pupils are equal, round, and reactive to light.  Neck: Normal range of motion. Neck supple. No JVD present.  Cardiovascular: Normal rate and regular rhythm.   Murmur heard. Grade 2 systolic murmur. BLE edema +1  Pulmonary/Chest: Effort normal. No respiratory distress. He has no wheezes. He exhibits no tenderness.  Crackles to LLL   Abdominal: Soft. Bowel sounds are normal. He  exhibits no distension. There is no tenderness.  Musculoskeletal: He exhibits no edema, tenderness or deformity.  R wrist deformity  Neurological: He is alert and oriented to person, place, and time.  Ambulates with walker  Skin: Skin is warm and dry. There is erythema.  Erythema and scaling plaques to lower extrem bilat.   Psychiatric: He has a normal mood and affect. His behavior is normal. Judgment and thought content normal.  Nursing note and vitals reviewed.   Labs reviewed: Basic Metabolic Panel:  Recent Labs  06/30/16 0508 07/01/16 0423 07/02/16 0542 07/08/16 07/11/16 07/15/16 1106  NA 138 138 138 140 136* 137  K 3.6 3.4* 3.7 4.6 4.5 4.6  CL 98* 97* 97*  --   --  104  CO2 30 32 32  --   --  24  GLUCOSE 137* 125* 107*  --   --  147*  BUN 76* 81* 80* 94* 99* 87*  CREATININE 2.67* 2.72* 2.36* 2.1* 2.6* 2.17*  CALCIUM 8.5* 8.5* 8.8*  --   --  9.0  MG 1.6*  --  1.9  --   --  1.9   Liver Function Tests:  Recent Labs  04/13/16 1046 04/30/16 0422 06/12/16 0556  AST 21 19 24   ALT 17 15* 15*  ALKPHOS 75 63 85  BILITOT 0.7 0.6 0.6  PROT 6.7 5.5* 6.0*  ALBUMIN 3.5 2.7* 3.0*   No results for input(s): LIPASE, AMYLASE in the last 8760 hours. No results for input(s): AMMONIA in the last 8760 hours. CBC:  Recent Labs  05/28/16 1556 06/12/16 0556 06/23/16 0705  06/27/16 0300 06/28/16 0325 06/29/16 0554 07/08/16 07/10/16  WBC 9.7 8.3 16.2*  < > 11.1* 10.0 9.8 14.9 9.5  NEUTROABS 7.5 5.8 12.1*  --   --   --   --   --   --   HGB 11.6* 10.3* 11.1*  < > 9.6* 9.7* 9.7* 15.5 10.4*  HCT 35.3* 33.3* 35.2*  < > 31.1* 31.7* 31.4* 52 34*  MCV 93.9 99.7 100.0  < > 99.4 99.1 99.1  --   --   PLT 234.0 229 369  < > 245 239 208 359 257  < > = values in this  interval not displayed. Cardiac Enzymes:  Recent Labs  06/12/16 2014 06/13/16 0035 06/23/16 0705  TROPONINI 0.81* 0.75* 0.13*   BNP: Invalid input(s): POCBNP CBG: No results for input(s): GLUCAP in the last 8760  hours.  Procedures and Imaging Studies During Stay: Dg Chest 2 View  Result Date: 06/24/2016 CLINICAL DATA:  Acute CHF, shortness of breath, cardiomyopathy, cirrhosis, chronic renal insufficiency EXAM: CHEST  2 VIEW COMPARISON:  PA and lateral chest x-ray of June 23, 2016 FINDINGS: There remains volume loss on the right with elevation of the hemidiaphragm. The left lung is adequately inflated. Coarse interstitial markings are present in the lower left lung. There is partial obscuration of the hemidiaphragm. The cardiac silhouette is enlarged. The pulmonary vascularity is mildly prominent centrally. IMPRESSION: Allowing for differences in positioning there has not been significant interval change since yesterday's study. Persistent left basilar atelectasis or pneumonia with trace left pleural effusion. Stable cardiomegaly. Electronically Signed   By: David  Martinique M.D.   On: 06/24/2016 07:52   Dg Chest 2 View  Result Date: 06/23/2016 CLINICAL DATA:  Shortness of Breath EXAM: CHEST  2 VIEW COMPARISON:  June 12, 2016 FINDINGS: There remains airspace consolidation throughout much of the left lower lobe with small left effusion. There is stable elevation of the right hemidiaphragm. There is underlying mild chronic interstitial prominence without frank edema. Heart size is within normal limits. The pulmonary vascularity is normal. No adenopathy. There is degenerative change in the thoracic spine. IMPRESSION: Persistent left lower lobe airspace consolidation small left effusion. Mild underlying interstitial prominence, probably representing chronic inflammatory type change. No new opacity. Stable cardiac silhouette. Stable elevation of the right hemidiaphragm. Electronically Signed   By: Lowella Grip III M.D.   On: 06/23/2016 07:43   Dg Chest Port 1 View  Result Date: 06/27/2016 CLINICAL DATA:  HCAP (healthcare associated pna) CHF, CHF exacerbation. No complaints per pt this am as pt was just waking up  and said he couldn't tell how he felt just yet. EXAM: PORTABLE CHEST 1 VIEW COMPARISON:  06/24/2016 FINDINGS: Patchy bilateral airspace lung opacities are without substantial change from the prior exam allowing for differences in patient positioning. Elevation right hemidiaphragm is stable. There are probable small pleural effusions. No pneumothorax. Cardiac silhouette is partly obscured but grossly normal in size. IMPRESSION: 1. No significant change in the bilateral lower lung zone patchy airspace opacities. This may reflect multifocal pneumonia or asymmetric pulmonary edema. Electronically Signed   By: Lajean Manes M.D.   On: 06/27/2016 07:31    Assessment/Plan:   1. Chronic combined systolic (congestive) and diastolic (congestive) heart failure (HCC) -Stable -Echo on 06/15/16 shows an EF of 45-50% and grade 1 diastolic dysfunction -Continue torsemide 20 mg daily  -Continue daily weights and 1800cc FR, f/u with cards  2. HCAP (healthcare-associated pneumonia) -Resolved -He has completed course of antibiotics -Improved O2 sats 90% ambulating, 95-98 at reast  3. Chronic venous insufficiency -Unna boots d/c'ed -Order for measurement for compression hose  -Eucerin creme to LE  4. Alcoholic cirrhosis of liver without ascites (HCC) -Stable -Ammonia level 80 on 8/30  5. Memory loss -Stable -Last MMSE 28/30 on 8/29 with deficits in orientation.  -f/u with PCP, could consider Aricept but resident has overall poor intake  6. CKD (chronic kidney disease) stage 3, GFR 30-59 ml/min -Labs pending, if stable resident will return home in the am -Weight is stable   7. Iron deficiency anemia due to chronic blood loss - Stable without iron  supplementation -would continue to monitor H/H due to the fact that he is on aspirin and plavix  8. Hyperlipidemia -Stable -Continue Zocor  9. Insomnia Off ambien, continue Remeron 7.5 mg qhs  10. CAD -Continue aspirin and Plavix per cards but monitor  CBC periodically due to hx of GIB   Patient is being discharged with the following home health services:  Home caregivers and med management  Patient is being discharged with the following durable medical equipment:  NA  Patient has been advised to f/u with their cardiologist in October, already scheduled. F/U with PCP within 3-4 weeks  Future labs/tests needed:  Continue to follow BMP and CBC

## 2016-07-23 ENCOUNTER — Encounter: Payer: Self-pay | Admitting: Gastroenterology

## 2016-07-23 ENCOUNTER — Ambulatory Visit (INDEPENDENT_AMBULATORY_CARE_PROVIDER_SITE_OTHER): Payer: Medicare Other | Admitting: Gastroenterology

## 2016-07-23 VITALS — BP 118/62 | HR 82 | Ht 70.0 in | Wt 145.0 lb

## 2016-07-23 DIAGNOSIS — K269 Duodenal ulcer, unspecified as acute or chronic, without hemorrhage or perforation: Secondary | ICD-10-CM

## 2016-07-23 DIAGNOSIS — K7030 Alcoholic cirrhosis of liver without ascites: Secondary | ICD-10-CM | POA: Diagnosis not present

## 2016-07-23 DIAGNOSIS — D509 Iron deficiency anemia, unspecified: Secondary | ICD-10-CM | POA: Diagnosis not present

## 2016-07-23 NOTE — Progress Notes (Addendum)
    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 He is accompanied by his son and caregiver. EGD findings below. Patient declined colonoscopy which was recommended in June. He was advised to discontinue aspirin and NSAID usage and to maintain a PPI daily indefinitely. He has a history of alcoholic cirrhosis. His was hospitalized again in August with acute systolic heart failure and hospital associated pneumonia and is making a good recovery from that hospitalization. He has no gastrointestinal complaints.  EGD 05/01/2016 - Small hiatal hernia. - Gastritis. Biopsied. - Multiple non-bleeding duodenal ulcers with no stigmata of bleeding. Gastric biopsy showed chronic gastritis, no evidence of H. pylori.  Current Medications, Allergies, Past Medical History, Past Surgical History, Family History and Social History were reviewed in [employer] record.  Physical Exam: General: Well developed, well nourished, frail, elderly, in a wheelchair, no acute distress Head: Normocephalic and atraumatic Eyes:  sclerae anicteric, EOMI Ears: Normal auditory acuity Mouth: No deformity or lesions Lungs: Clear throughout to auscultation Heart: Regular rate and rhythm; [id number] systolic murmur. No rubs or bruits Abdomen: Soft, non tender and non distended. No masses, hepatosplenomegaly or hernias noted. Normal Bowel sounds Musculoskeletal: Symmetrical with no gross deformities  Pulses:  Normal pulses noted Extremities: No clubbing, cyanosis, edema or deformities noted Neurological: Alert oriented x 4, grossly nonfocal Psychological:  Alert and cooperative. Normal mood and affect  Assessment and Recommendations:  1. Duodenal ulcers and anemia. Continue pantoprazole 40 mg daily, or similar PPI daily, indefinitely. ASA 81 mg daily is okay however would not recommend using any additional  NSAIDs or Cox 2 medications. Ideally he should have a colonoscopy to complete his gastrointestinal evaluation however his health status makes colonoscopy higher risk and he declined colonoscopy again today. GI follow up in 1 year and as needed.  2. Alcoholic cirrhosis. No varices noted on recent EGD. No signs of hepatic decompensation. Continue to avoid all etoh use. GI follow-up in 1 year and as needed.

## 2016-07-23 NOTE — Patient Instructions (Addendum)
Stay on Protonix twice daily.   Thank you for choosing me and [facility].  Pricilla Riffle. Dagoberto Ligas., MD., Marval Regal

## 2016-07-24 ENCOUNTER — Telehealth: Payer: Self-pay | Admitting: Internal Medicine

## 2016-07-24 MED ORDER — MIRTAZAPINE 15 MG PO TABS
7.5000 mg | ORAL_TABLET | Freq: Every day | ORAL | 3 refills | Status: DC
Start: 2016-07-24 — End: 2017-03-11

## 2016-07-24 NOTE — Telephone Encounter (Signed)
Patient went home on Tuesday from Rehab.  Patient needs remeron script sent to [facility].

## 2016-07-24 NOTE — Telephone Encounter (Signed)
Sent rx for remeron to [facility]...Johny Chess

## 2016-07-26 ENCOUNTER — Encounter (HOSPITAL_COMMUNITY): Payer: Self-pay | Admitting: Emergency Medicine

## 2016-07-26 ENCOUNTER — Observation Stay (HOSPITAL_COMMUNITY)
Admission: EM | Admit: 2016-07-26 | Discharge: 2016-07-26 | Discharge status: Against Medical Advice | Payer: Medicare Other | Attending: Internal Medicine | Admitting: Internal Medicine

## 2016-07-26 ENCOUNTER — Emergency Department (HOSPITAL_COMMUNITY): Payer: Medicare Other

## 2016-07-26 DIAGNOSIS — I878 Other specified disorders of veins: Secondary | ICD-10-CM | POA: Insufficient documentation

## 2016-07-26 DIAGNOSIS — K219 Gastro-esophageal reflux disease without esophagitis: Secondary | ICD-10-CM | POA: Diagnosis not present

## 2016-07-26 DIAGNOSIS — Z8719 Personal history of other diseases of the digestive system: Secondary | ICD-10-CM | POA: Insufficient documentation

## 2016-07-26 DIAGNOSIS — Z85820 Personal history of malignant melanoma of skin: Secondary | ICD-10-CM | POA: Insufficient documentation

## 2016-07-26 DIAGNOSIS — N184 Chronic kidney disease, stage 4 (severe): Secondary | ICD-10-CM | POA: Diagnosis not present

## 2016-07-26 DIAGNOSIS — I2510 Atherosclerotic heart disease of native coronary artery without angina pectoris: Secondary | ICD-10-CM | POA: Diagnosis not present

## 2016-07-26 DIAGNOSIS — R0602 Shortness of breath: Secondary | ICD-10-CM | POA: Insufficient documentation

## 2016-07-26 DIAGNOSIS — I130 Hypertensive heart and chronic kidney disease with heart failure and stage 1 through stage 4 chronic kidney disease, or unspecified chronic kidney disease: Secondary | ICD-10-CM | POA: Diagnosis not present

## 2016-07-26 DIAGNOSIS — I422 Other hypertrophic cardiomyopathy: Secondary | ICD-10-CM | POA: Insufficient documentation

## 2016-07-26 DIAGNOSIS — F0390 Unspecified dementia without behavioral disturbance: Secondary | ICD-10-CM | POA: Diagnosis not present

## 2016-07-26 DIAGNOSIS — N179 Acute kidney failure, unspecified: Secondary | ICD-10-CM

## 2016-07-26 DIAGNOSIS — I252 Old myocardial infarction: Secondary | ICD-10-CM | POA: Diagnosis not present

## 2016-07-26 DIAGNOSIS — R0789 Other chest pain: Secondary | ICD-10-CM | POA: Diagnosis not present

## 2016-07-26 DIAGNOSIS — Z5321 Procedure and treatment not carried out due to patient leaving prior to being seen by health care provider: Secondary | ICD-10-CM | POA: Insufficient documentation

## 2016-07-26 DIAGNOSIS — Z7982 Long term (current) use of aspirin: Secondary | ICD-10-CM | POA: Insufficient documentation

## 2016-07-26 DIAGNOSIS — Z85828 Personal history of other malignant neoplasm of skin: Secondary | ICD-10-CM | POA: Insufficient documentation

## 2016-07-26 DIAGNOSIS — Z86010 Personal history of colonic polyps: Secondary | ICD-10-CM | POA: Insufficient documentation

## 2016-07-26 DIAGNOSIS — K7030 Alcoholic cirrhosis of liver without ascites: Secondary | ICD-10-CM | POA: Diagnosis not present

## 2016-07-26 DIAGNOSIS — E785 Hyperlipidemia, unspecified: Secondary | ICD-10-CM | POA: Diagnosis present

## 2016-07-26 DIAGNOSIS — I248 Other forms of acute ischemic heart disease: Secondary | ICD-10-CM

## 2016-07-26 DIAGNOSIS — R072 Precordial pain: Secondary | ICD-10-CM | POA: Diagnosis not present

## 2016-07-26 DIAGNOSIS — E46 Unspecified protein-calorie malnutrition: Secondary | ICD-10-CM | POA: Diagnosis not present

## 2016-07-26 DIAGNOSIS — R079 Chest pain, unspecified: Principal | ICD-10-CM | POA: Diagnosis present

## 2016-07-26 DIAGNOSIS — I350 Nonrheumatic aortic (valve) stenosis: Secondary | ICD-10-CM | POA: Diagnosis not present

## 2016-07-26 DIAGNOSIS — Z7902 Long term (current) use of antithrombotics/antiplatelets: Secondary | ICD-10-CM | POA: Insufficient documentation

## 2016-07-26 DIAGNOSIS — I214 Non-ST elevation (NSTEMI) myocardial infarction: Secondary | ICD-10-CM | POA: Diagnosis not present

## 2016-07-26 DIAGNOSIS — I5042 Chronic combined systolic (congestive) and diastolic (congestive) heart failure: Secondary | ICD-10-CM | POA: Diagnosis not present

## 2016-07-26 DIAGNOSIS — K766 Portal hypertension: Secondary | ICD-10-CM | POA: Insufficient documentation

## 2016-07-26 DIAGNOSIS — D500 Iron deficiency anemia secondary to blood loss (chronic): Secondary | ICD-10-CM | POA: Diagnosis present

## 2016-07-26 DIAGNOSIS — I421 Obstructive hypertrophic cardiomyopathy: Secondary | ICD-10-CM | POA: Insufficient documentation

## 2016-07-26 DIAGNOSIS — I10 Essential (primary) hypertension: Secondary | ICD-10-CM | POA: Diagnosis present

## 2016-07-26 DIAGNOSIS — I872 Venous insufficiency (chronic) (peripheral): Secondary | ICD-10-CM | POA: Diagnosis not present

## 2016-07-26 DIAGNOSIS — K3189 Other diseases of stomach and duodenum: Secondary | ICD-10-CM | POA: Insufficient documentation

## 2016-07-26 DIAGNOSIS — N183 Chronic kidney disease, stage 3 unspecified: Secondary | ICD-10-CM | POA: Diagnosis present

## 2016-07-26 DIAGNOSIS — I5043 Acute on chronic combined systolic (congestive) and diastolic (congestive) heart failure: Secondary | ICD-10-CM | POA: Diagnosis present

## 2016-07-26 DIAGNOSIS — I4510 Unspecified right bundle-branch block: Secondary | ICD-10-CM | POA: Diagnosis not present

## 2016-07-26 DIAGNOSIS — R739 Hyperglycemia, unspecified: Secondary | ICD-10-CM | POA: Diagnosis present

## 2016-07-26 DIAGNOSIS — R413 Other amnesia: Secondary | ICD-10-CM | POA: Diagnosis present

## 2016-07-26 DIAGNOSIS — K7031 Alcoholic cirrhosis of liver with ascites: Secondary | ICD-10-CM | POA: Diagnosis not present

## 2016-07-26 HISTORY — DX: Unspecified dementia, unspecified severity, without behavioral disturbance, psychotic disturbance, mood disturbance, and anxiety: F03.90

## 2016-07-26 LAB — CBC
HCT: 33.3 % — ABNORMAL LOW (ref 39.0–52.0)
Hemoglobin: 10.3 g/dL — ABNORMAL LOW (ref 13.0–17.0)
MCH: 30.2 pg (ref 26.0–34.0)
MCHC: 30.9 g/dL (ref 30.0–36.0)
MCV: 97.7 fL (ref 78.0–100.0)
PLATELETS: 248 10*3/uL (ref 150–400)
RBC: 3.41 MIL/uL — AB (ref 4.22–5.81)
RDW: 14 % (ref 11.5–15.5)
WBC: 8.2 10*3/uL (ref 4.0–10.5)

## 2016-07-26 LAB — BRAIN NATRIURETIC PEPTIDE: B NATRIURETIC PEPTIDE 5: 999.9 pg/mL — AB (ref 0.0–100.0)

## 2016-07-26 LAB — TROPONIN I
TROPONIN I: 1.03 ng/mL — AB (ref <0.03)
Troponin I: 0.4 ng/mL (ref <0.03)

## 2016-07-26 LAB — PROTIME-INR
INR: 1.15
Prothrombin Time: 14.8 seconds (ref 11.4–15.2)

## 2016-07-26 LAB — I-STAT CHEM 8, ED
BUN: 48 mg/dL — ABNORMAL HIGH (ref 6–20)
Calcium, Ion: 1.18 mmol/L (ref 1.15–1.40)
Chloride: 111 mmol/L (ref 101–111)
Creatinine, Ser: 2.1 mg/dL — ABNORMAL HIGH (ref 0.61–1.24)
Glucose, Bld: 139 mg/dL — ABNORMAL HIGH (ref 65–99)
HCT: 31 % — ABNORMAL LOW (ref 39.0–52.0)
Hemoglobin: 10.5 g/dL — ABNORMAL LOW (ref 13.0–17.0)
Potassium: 5.1 mmol/L (ref 3.5–5.1)
Sodium: 143 mmol/L (ref 135–145)
TCO2: 22 mmol/L (ref 0–100)

## 2016-07-26 LAB — I-STAT TROPONIN, ED
Troponin i, poc: 0.44 ng/mL (ref 0.00–0.08)
Troponin i, poc: 0.71 ng/mL (ref 0.00–0.08)

## 2016-07-26 LAB — APTT: aPTT: 31 seconds (ref 24–36)

## 2016-07-26 MED ORDER — ALPRAZOLAM 0.25 MG PO TABS: 0.2500 mg | ORAL_TABLET | Freq: Every evening | ORAL | Status: DC | PRN

## 2016-07-26 MED ORDER — ONDANSETRON HCL 4 MG/2ML IJ SOLN: 4.0000 mg | Freq: Four times a day (QID) | INTRAMUSCULAR | Status: DC | PRN

## 2016-07-26 MED ORDER — ACETAMINOPHEN 325 MG PO TABS: 650.0000 mg | ORAL_TABLET | ORAL | Status: DC | PRN

## 2016-07-26 MED ORDER — METOPROLOL SUCCINATE ER 50 MG PO TB24
50.0000 mg | ORAL_TABLET | Freq: Every day | ORAL | Status: DC
  Administered 2016-07-26: 50 mg via ORAL
  Filled 2016-07-26: qty 1

## 2016-07-26 MED ORDER — PANTOPRAZOLE SODIUM 40 MG PO TBEC
40.0000 mg | DELAYED_RELEASE_TABLET | Freq: Two times a day (BID) | ORAL | Status: DC
  Administered 2016-07-26: 40 mg via ORAL
  Filled 2016-07-26: qty 1

## 2016-07-26 MED ORDER — ISOSORBIDE MONONITRATE ER 30 MG PO TB24
45.0000 mg | ORAL_TABLET | Freq: Every day | ORAL | Status: DC
  Administered 2016-07-26: 45 mg via ORAL
  Filled 2016-07-26: qty 2

## 2016-07-26 MED ORDER — GI COCKTAIL ~~LOC~~: 30.0000 mL | Freq: Four times a day (QID) | ORAL | Status: DC | PRN

## 2016-07-26 MED ORDER — NITROGLYCERIN 0.4 MG SL SUBL: 0.4000 mg | SUBLINGUAL_TABLET | SUBLINGUAL | Status: DC | PRN

## 2016-07-26 MED ORDER — MIRTAZAPINE 15 MG PO TABS: 7.5000 mg | ORAL_TABLET | Freq: Every day | ORAL | Status: DC

## 2016-07-26 MED ORDER — HEPARIN (PORCINE) IN NACL 100-0.45 UNIT/ML-% IJ SOLN
800.0000 [IU]/h | INTRAMUSCULAR | Status: DC
Start: 2016-07-26 — End: 2016-07-26
  Administered 2016-07-26: 800 [IU]/h via INTRAVENOUS
  Filled 2016-07-26: qty 250

## 2016-07-26 MED ORDER — HEPARIN BOLUS VIA INFUSION
3000.0000 [IU] | Freq: Once | INTRAVENOUS | Status: AC
  Administered 2016-07-26: 3000 [IU] via INTRAVENOUS
  Filled 2016-07-26: qty 3000

## 2016-07-26 MED ORDER — CLOPIDOGREL BISULFATE 75 MG PO TABS
75.0000 mg | ORAL_TABLET | Freq: Every day | ORAL | Status: DC
  Administered 2016-07-26: 75 mg via ORAL
  Filled 2016-07-26: qty 1

## 2016-07-26 MED ORDER — TORSEMIDE 20 MG PO TABS: 20.0000 mg | ORAL_TABLET | Freq: Every day | ORAL | Status: DC

## 2016-07-26 MED ORDER — ISOSORBIDE MONONITRATE ER 30 MG PO TB24: 30.0000 mg | ORAL_TABLET | Freq: Every day | ORAL | Status: DC

## 2016-07-26 MED ORDER — SIMVASTATIN 20 MG PO TABS: 20.0000 mg | ORAL_TABLET | Freq: Every day | ORAL | Status: DC

## 2016-07-26 MED ORDER — FUROSEMIDE 10 MG/ML IJ SOLN
40.0000 mg | Freq: Two times a day (BID) | INTRAMUSCULAR | Status: DC
  Administered 2016-07-26: 40 mg via INTRAVENOUS
  Filled 2016-07-26: qty 4

## 2016-07-26 MED ORDER — MORPHINE SULFATE (PF) 2 MG/ML IV SOLN: 2.0000 mg | INTRAVENOUS | Status: DC | PRN

## 2016-07-26 NOTE — ED Notes (Signed)
Pt states that he is from [facility].

## 2016-07-26 NOTE — ED Provider Notes (Addendum)
[facility] DEPT Provider Note   CSN: [id number] Arrival date & time: 07/26/16  0349  By signing my name below, I, Gwenlyn Fudge, attest that this documentation has been prepared under the direction and in the presence of Varney Biles, MD. Electronically Signed: Gwenlyn Fudge, ED Scribe. 07/26/16. 7:11 AM.   History   Chief Complaint Chief Complaint  Patient presents with  . Shortness of Breath   HPI HPI Comments: [name] is a 82 y.o. male with PMHx of CHF likely due to ischemic cardiomyopathy, CKD, Aortic stenosis, cirrhosis presents to the Emergency Department complaining of gradual onset shortness of breath for the last few days. He states walking around the house makes him short of breath, the same distance that typically is fine. Pt denies chest pain with exertion, but last night he did get some L sided chest pain, which got him concerned and he decided to come to the ER. Pt is now chest pain free. Pt is compliant with his medications. Normally sleeps without trouble, does have a small problem when laying down flat.  Past Medical History:  Diagnosis Date  . Acute combined systolic and diastolic CHF, NYHA class 1 ([place])   . Acute respiratory failure ([place])   . Alcoholic cirrhosis ([place])   . 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 Alcoholism ([place])   . Aortic stenosis   . Ascites 11/2004   "early" SBP on paracentesis.   . Chronic gastritis 05/01/2016  . CKD (chronic kidney disease), stage III 10/11/2015  . Colon polyp perhaps in 1990s   pathology/type not known  . Diverticulosis of colon perhaps in 1990s  . Duodenal ulcer 04/2016  . Fracture of wrist 2014 spring vs early summer   refused orthopods suggestion to set the bone.   Marland Kitchen GERD (gastroesophageal reflux disease)   . HLD (hyperlipidemia)   . HTN (hypertension)   . Hypertrophic cardiomyopathy ([place])   . Portal hypertensive gastropathy 2006  . Renal insufficiency   . Rhabdomyolysis 01/2005    Patient Active  Problem List   Diagnosis Date Noted  . NSTEMI (non-ST elevated myocardial infarction) ([place]) 07/26/2016  . Chronic combined systolic (congestive) and diastolic (congestive) heart failure ([place]) 07/15/2016  . Venous stasis dermatitis of both lower extremities   . Chest pain 06/13/2016  . RBBB   . Gastritis 05/02/2016  . Demand ischemia of myocardium ([place]) 05/02/2016  . Duodenal ulcer disease   . Iron deficiency anemia due to chronic blood loss   . GI bleed 04/13/2016  . CKD (chronic kidney disease) stage 3, GFR 30-59 ml/min 10/11/2015  . Memory loss 10/04/2015  . Hyperglycemia 02/23/2015  . Gross hematuria 08/23/2014  . Aortic stenosis, mild 09/13/2013  . Malignant melanoma of lower extremity or hip, left 03/17/2012  . Routine health maintenance 03/17/2012  . Alcoholic cirrhosis ([place]) 123456  . Portal hypertensive gastropathy 05/26/2011  . Chronic venous insufficiency 10/10/2010  . Alcohol abuse 09/05/2009  . Hyperlipidemia 06/23/2009  . HYPERTENSION, BENIGN 06/23/2009  . Hypertrophic obstructive cardiomyopathy ([place]) 06/23/2009    Past Surgical History:  Procedure Laterality Date  . BASAL CELL CARCINOMA EXCISION Left 11/2012   lower eyelid  . ESOPHAGOGASTRODUODENOSCOPY N/A 08/04/2013   Procedure: ESOPHAGOGASTRODUODENOSCOPY (EGD);  Surgeon: Ladene Artist, MD;  Location: [facility] ENDOSCOPY;  Service: Endoscopy;  Laterality: N/A;  . ESOPHAGOGASTRODUODENOSCOPY (EGD) WITH PROPOFOL N/A 05/01/2016   Procedure: ESOPHAGOGASTRODUODENOSCOPY (EGD) WITH PROPOFOL;  Surgeon: Jerene Bears, MD;  Location[facility] ENDOSCOPY;  Service: Endoscopy;  Laterality: N/A;  . INGUINAL HERNIA  REPAIR    . MELANOMA EXCISION Left 02/2011   malignant melanoma of thigh       Home Medications    Prior to Admission medications   Medication Sig Start Date End Date Taking? Authorizing Provider  acetaminophen (TYLENOL) 325 MG tablet Take 650 mg by mouth every 6 (six) hours as needed for pain.   Yes Historical Provider,  MD  ALPRAZolam (XANAX) 0.25 MG tablet Take 1 tablet (0.25 mg total) by mouth at bedtime as needed for anxiety. 07/02/16  Yes Jonetta Osgood, MD  aspirin EC 81 MG EC tablet Take 1 tablet (81 mg total) by mouth daily. 06/15/16  Yes Robbie Lis, MD  clopidogrel (PLAVIX) 75 MG tablet Take 1 tablet (75 mg total) by mouth daily. 06/15/16  Yes Robbie Lis, MD  isosorbide mononitrate (IMDUR) 30 MG 24 hr tablet Take 30 mg by mouth daily.   Yes Historical Provider, MD  metoprolol succinate (TOPROL-XL) 25 MG 24 hr tablet Take 2 tablets (50 mg total) by mouth daily. Take with or immediately following a meal. 07/17/16  Yes [name], PA-C  mirtazapine (REMERON) 15 MG tablet Take 0.5 tablets (7.5 mg total) by mouth at bedtime. 07/24/16  Yes Biagio Borg, MD  Multiple Vitamin (MULTIVITAMIN WITH MINERALS) TABS Take 1 tablet by mouth daily.   Yes Historical Provider, MD  pantoprazole (PROTONIX) 40 MG tablet Take 1 tablet (40 mg total) by mouth 2 (two) times daily. 05/02/16  Yes [place] Marisa Hua, MD  potassium chloride 20 MEQ TBCR Take 20 mEq by mouth 2 (two) times daily. 07/02/16  Yes [name], MD  simvastatin (ZOCOR) 20 MG tablet Take 20 mg by mouth daily.   Yes Historical Provider, MD  torsemide (DEMADEX) 20 MG tablet Take 1 tablet (20 mg total) by mouth daily. 07/15/16  Yes [name], PA-C    Family History Family History  Problem Relation Age of Onset  . Cancer Father 93    lung cancer  . Heart disease Mother   . Heart disease Sister     Social History Social History  Substance Use Topics  . Smoking status: Never Smoker  . Smokeless tobacco: Never Used  . Alcohol use No     Comment: member of AA     Allergies   Review of patient's allergies indicates no known allergies.   Review of Systems Review of Systems 10 Systems reviewed and are negative for acute change except as noted in the HPI.  Physical Exam Updated Vital Signs BP 134/66   Pulse 89   Temp  97.5 F (36.4 C) (Oral)   Resp 15   SpO2 (!) 89%   Physical Exam  Constitutional: He is oriented to person, place, and time. He appears well-developed.  HENT:  Head: Atraumatic.  Neck: Neck supple.  Cardiovascular: Normal rate.   Murmur heard. Pulmonary/Chest: Effort normal. No respiratory distress. He has rales.  Bilateral lung field rales upto the mid chest, R worse than L.  Musculoskeletal: He exhibits edema.  Neurological: He is alert and oriented to person, place, and time.  Skin: Skin is warm. Rash noted.  Nursing note and vitals reviewed.    ED Treatments / Results  DIAGNOSTIC STUDIES: Oxygen Saturation is % on RA, normal by my interpretation.    Labs (all labs ordered are listed, but only abnormal results are displayed) Labs Reviewed  CBC - Abnormal; Notable for the following:       Result Value  RBC 3.41 (*)    Hemoglobin 10.3 (*)    HCT 33.3 (*)    All other components within normal limits  BRAIN NATRIURETIC PEPTIDE - Abnormal; Notable for the following:    B Natriuretic Peptide 999.9 (*)    All other components within normal limits  TROPONIN I - Abnormal; Notable for the following:    Troponin I 0.40 (*)    All other components within normal limits  I-STAT TROPOININ, ED - Abnormal; Notable for the following:    Troponin i, poc 0.44 (*)    All other components within normal limits  I-STAT CHEM 8, ED - Abnormal; Notable for the following:    BUN 48 (*)    Creatinine, Ser 2.10 (*)    Glucose, Bld 139 (*)    Hemoglobin 10.5 (*)    HCT 31.0 (*)    All other components within normal limits  I-STAT TROPOININ, ED    EKG  EKG Interpretation None     ED ECG REPORT   Date: 07/26/2016  Rate: 90  Rhythm: normal sinus rhythm  QRS Axis: normal  Intervals: PR prolonged  ST/T Wave abnormalities: nonspecific ST changes and ST depressions laterally  Conduction Disutrbances: RBBB  Narrative Interpretation:   Old EKG Reviewed: changes noted - TWI in lead I  and avL is new, and ST depression is new  I have personally reviewed the EKG tracing and agree with the computerized printout as noted.   Radiology Dg Chest Portable 1 View  Result Date: 07/26/2016 CLINICAL DATA:  Shortness of breath tonight. EXAM: PORTABLE CHEST 1 VIEW COMPARISON:  Radiograph 06/27/2016 FINDINGS: Chronic elevation of right hemidiaphragm. Improved bibasilar aeration with resolve it right lower lung zone opacity. Minimal persistent atelectasis at the left lung base. Cardiomediastinal contours are unchanged. No new focal airspace opacity. No pneumothorax. Unchanged osseous structures. IMPRESSION: Improved lung aeration from prior exam. Minimal residual left basilar atelectasis. Chronic elevation of the right hemidiaphragm. Electronically Signed   By: Jeb Levering M.D.   On: 07/26/2016 04:30    Procedures .Critical Care Performed by: Varney Biles Authorized by: Varney Biles   Critical care provider statement:    Critical care time (minutes):  50   Critical care time was exclusive of:  Separately billable procedures and treating other patients   Critical care was necessary to treat or prevent imminent or life-threatening deterioration of the following conditions:  Cardiac failure   Critical care was time spent personally by me on the following activities:  Blood draw for specimens, development of treatment plan with patient or surrogate, discussions with consultants, examination of patient, obtaining history from patient or surrogate, ordering and performing treatments and interventions, ordering and review of laboratory studies, ordering and review of radiographic studies, pulse oximetry, re-evaluation of patient's condition and review of old charts    (including critical care time)  Medications Ordered in ED Medications - No data to display   Initial Impression / Assessment and Plan / ED Course  I have reviewed the triage vital signs and the nursing  notes.  Pertinent labs & imaging results that were available during my care of the patient were reviewed by me and considered in my medical decision making (see chart for details).  Clinical Course  Comment By Time  Cardiology will see the patient as a consult. Varney Biles, MD 09/17 [phone]  Pt has hx of GI bleed. We will defer heparin to the admitting team and Cards for now -as patient is chest pain  free. Repeat trop trend might be more helpful in the decision. Varney Biles, MD 09/17 0710   82 y.o. male with past medical history significant for chronic combined systolic and diastolic heart failure, aortic stenosis, chronic kidney disease stage III, alcoholic cirrhosis with associated chronic bilateral lower extremity edema and chronic stasis dermatitis. He had a recent admission for fluid overload and NSTEMI and  It was thought that pt had likely ischemic disease, and pt had refused catheterization.  Pt is having exertional dyspnea with rales on his lungs. Clinical concerns for acute CHF exacerbation. He also had chest pain prior to ER arrival, which was L sided, so ACS is also on the ddx.  EKG has no acute changes - there are new TWI and ST depression appreciated.  Pt refusing admission initially, but after discussing the troponin results, he agrees to being admitted for 1-2 days.  Final Clinical Impressions(s) / ED Diagnoses   Final diagnoses:  NSTEMI (non-ST elevated myocardial infarction) [facility])      New Prescriptions New Prescriptions   No medications on file     Varney Biles, MD 07/26/16 [place], MD 07/26/16 [phone]

## 2016-07-26 NOTE — Progress Notes (Signed)
Educated pt that his troponins are rising, and that Dr. Meda Coffee and Dr. Clydene Laming would like that he say overnight for further treatment.  Pt still refuses to stay, and will leave AMA.  Signed document in chart.  Iv and telemetry removed and CCMD has been notified.  Heparin gtts stopped.

## 2016-07-26 NOTE — ED Notes (Signed)
This RN spoke with Dr. Sherral Hammers, and he is aware of pts troponin and the request for PRN blood pressure medication.

## 2016-07-26 NOTE — ED Notes (Signed)
Gabriel Cirri RN on [place] asked that this RN page the admitting doctor and ask for a PRN medication for blood pressure.

## 2016-07-26 NOTE — ED Notes (Signed)
Attempting to page Dr. Sherral Hammers.

## 2016-07-26 NOTE — Progress Notes (Signed)
Heparin infusion ordered to be given at 1100, however, Dr. Meda Coffee requested that it be held until we see most recent troponin level.  Troponin level = 1.04, therefore, Dr. Meda Coffee instructed to go ahead and start the heparin infusion.

## 2016-07-26 NOTE — Progress Notes (Signed)
R/t pt requesting to leave AMA, notified Dr. Meda Coffee.

## 2016-07-26 NOTE — H&P (Signed)
History and Physical    [name] [id number] DOB: 04/18/1929 DOA: 07/26/2016  PCP: [name], MD [facility]  Chief Complaint: chest pain sob  HPI: [name] is a pleasant demented 82 y.o. WM PMHx chronic combined systolic and diastolic heart failure, mild aortic stenosis, hypertension, chronic kidney disease stage III, hypertension, alcoholic cirrhosis with associated chronic bilateral lower extremity edema and chronic stasis dermatitis, memory loss, anemia presents to the emergency Department chief complaint worsening shortness of breath left anterior chest pain. Initial evaluation reveals abnormal EKG and initial troponin 0.40.  Information is obtained from the patient and the chart noting that information from patient may be unreliable due to memory loss. He states over the last several days he's developed gradual worsening shortness of breath with exertion. Last evening he developed diffuse left anterior nonradiating chest pain which concerned him so he came to the emergency department. He states he was sitting in the chair when the pain started rated it 3 out of 10 stating it lasted less than 5 minutes. This time he arrived to emergency department he was chest pain-free. He denies headache dizziness syncope or near-syncope. He denies cough and worsening lower extremity edema. He denies fever chills abdominal pain nausea vomiting diarrhea constipation. He denies dysuria hematuria frequency or urgency. He denies any weight gain but states he does not weigh daily and has never been instructed to do so. Endorses some orthopnea.  Of note he was recently hospitalized August 15-August 24 acute on chronic systolic heart failure, healthcare associated pneumonia. He was provided with antibiotics and diuretics seen by cardiology who started milrinone infusion and his volume status improved. Prior to that hospitalization he was hospitalized August 4 to August 7 after  presenting with chest pain and shortness of breath. Evaluation at that time revealed findings of non-STEMI secondary to demand ischemia in the setting of chronic kidney disease. Myoview study at that time showed evidence of apparent prior infarct without reversible ischemia. He refused cath at that time. States does not know his weight nor does he weigh himself daily.    ED Course: In the emergency department he is afebrile hemodynamically stable and not hypoxic.  Review of Systems: As per HPI otherwise 10 point review of systems negative.   Ambulatory Status: Ambulates with a walker. Denies any recent falls  Past Medical History:  Diagnosis Date  . Acute combined systolic and diastolic CHF, NYHA class 1 ([place])   . Acute respiratory failure ([place])   . Alcoholic cirrhosis ([place])   . 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 Alcoholism ([place])   . Aortic stenosis   . Ascites 11/2004   "early" SBP on paracentesis.   . Chronic gastritis 05/01/2016  . CKD (chronic kidney disease), stage III 10/11/2015  . Colon polyp perhaps in 1990s   pathology/type not known  . Dementia   . Diverticulosis of colon perhaps in 1990s  . Duodenal ulcer 04/2016  . Fracture of wrist 2014 spring vs early summer   refused orthopods suggestion to set the bone.   Marland Kitchen GERD (gastroesophageal reflux disease)   . HLD (hyperlipidemia)   . HTN (hypertension)   . Hypertrophic cardiomyopathy ([place])   . Portal hypertensive gastropathy 2006  . Renal insufficiency   . Rhabdomyolysis 01/2005    Past Surgical History:  Procedure Laterality Date  . BASAL CELL CARCINOMA EXCISION Left 11/2012   lower eyelid  . ESOPHAGOGASTRODUODENOSCOPY N/A 08/04/2013   Procedure: ESOPHAGOGASTRODUODENOSCOPY (EGD);  Surgeon: Pricilla Riffle  Fuller Plan, MD;  Location: [facility];  Service: Endoscopy;  Laterality: N/A;  . ESOPHAGOGASTRODUODENOSCOPY (EGD) WITH PROPOFOL N/A 05/01/2016   Procedure: ESOPHAGOGASTRODUODENOSCOPY (EGD) WITH PROPOFOL;  Surgeon: Jerene Bears, MD;  [facility] ENDOSCOPY;  Service: Endoscopy;  Laterality: N/A;  . INGUINAL HERNIA REPAIR    . MELANOMA EXCISION Left 02/2011   malignant melanoma of thigh    Social History   Social History  . Marital status: Married    Spouse name: N/A  . Number of children: 4  . Years of education: 24   Occupational History  . pathologist Retired    retired   Social History Main Topics  . Smoking status: Never Smoker  . Smokeless tobacco: Never Used  . Alcohol use No     Comment: member of AA  . Drug use: No  . Sexual activity: Not on file   Other Topics Concern  . 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  Remains independent. ACP- not discussed, will approach at next OV    No Known Allergies  Family History  Problem Relation Age of Onset  . Cancer Father 55    lung cancer  . Heart disease Mother   . Heart disease Sister     Prior to Admission medications   Medication Sig Start Date End Date Taking? Authorizing Provider  acetaminophen (TYLENOL) 325 MG tablet Take 650 mg by mouth every 6 (six) hours as needed for pain.   Yes Historical Provider, MD  ALPRAZolam (XANAX) 0.25 MG tablet Take 1 tablet (0.25 mg total) by mouth at bedtime as needed for anxiety. 07/02/16  Yes Jonetta Osgood, MD  aspirin EC 81 MG EC tablet Take 1 tablet (81 mg total) by mouth daily. 06/15/16  Yes Robbie Lis, MD  clopidogrel (PLAVIX) 75 MG tablet Take 1 tablet (75 mg total) by mouth daily. 06/15/16  Yes Robbie Lis, MD  isosorbide mononitrate (IMDUR) 30 MG 24 hr tablet Take 30 mg by mouth daily.   Yes Historical Provider, MD  metoprolol succinate (TOPROL-XL) 25 MG 24 hr tablet Take 2 tablets (50 mg total) by mouth daily. Take with or immediately following a meal. 07/17/16  Yes [name], PA-C  mirtazapine (REMERON) 15 MG tablet Take 0.5 tablets (7.5 mg total) by mouth at bedtime. 07/24/16  Yes Biagio Borg, MD  Multiple Vitamin  (MULTIVITAMIN WITH MINERALS) TABS Take 1 tablet by mouth daily.   Yes Historical Provider, MD  pantoprazole (PROTONIX) 40 MG tablet Take 1 tablet (40 mg total) by mouth 2 (two) times daily. 05/02/16  Yes Clanford Marisa Hua, MD  potassium chloride 20 MEQ TBCR Take 20 mEq by mouth 2 (two) times daily. 07/02/16  Yes [name], MD  simvastatin (ZOCOR) 20 MG tablet Take 20 mg by mouth daily.   Yes Historical Provider, MD  torsemide (DEMADEX) 20 MG tablet Take 1 tablet (20 mg total) by mouth daily. 07/15/16  Yes [name], PA-C    Physical Exam: Vitals:   07/26/16 0727 07/26/16 0730 07/26/16 0756 07/26/16 0800  BP: 135/65 161/81 152/77 156/80  Pulse: 93 91 88 82  Resp: 21 22 24 24   Temp:      TempSrc:      SpO2: 99% 98% 98% 96%     General:  Appears calm and comfortable, no acute distress Eyes:  PERRL, EOMI, normal lids, iris ENT:  grossly normal hearing, lips &  tongue, mucous membranes of his mouth are moist and pink Neck:  no LAD, masses or thyromegaly Cardiovascular:  RRR, +murmur.  Bilateral compression stockings in place with 2-3 LE edema.  Respiratory:  Mild increased work of breathing with conversation. Faint crackles bilateral midlobe no wheeze Abdomen:  soft, ntnd, NABS Skin:  no rash or induration seen on limited exam Musculoskeletal:  grossly normal tone BUE/BLE, good ROM, no bony abnormality Psychiatric:  grossly normal mood and affect, speech fluent and appropriate, AOx3 Neurologic:  Oriented to self and place obvious short-term memory issues radial nerves II through XII grossly intact  Labs on Admission: I have personally reviewed following labs and imaging studies  CBC:  Recent Labs Lab 07/26/16 0447 07/26/16 0453  WBC 8.2  --   HGB 10.3* 10.5*  HCT 33.3* 31.0*  MCV 97.7  --   PLT 248  --    Basic Metabolic Panel:  Recent Labs Lab 07/26/16 0453  NA 143  K 5.1  CL 111  GLUCOSE 139*  BUN 48*  CREATININE 2.10*   GFR: Estimated  Creatinine Clearance: 23.9 mL/min (by C-G formula based on SCr of 2.1 mg/dL (H)). Liver Function Tests: No results for input(s): AST, ALT, ALKPHOS, BILITOT, PROT, ALBUMIN in the last 168 hours. No results for input(s): LIPASE, AMYLASE in the last 168 hours. No results for input(s): AMMONIA in the last 168 hours. Coagulation Profile: No results for input(s): INR, PROTIME in the last 168 hours. Cardiac Enzymes:  Recent Labs Lab 07/26/16 0447  TROPONINI 0.40*   BNP (last 3 results) No results for input(s): PROBNP in the last 8760 hours. HbA1C: No results for input(s): HGBA1C in the last 72 hours. CBG: No results for input(s): GLUCAP in the last 168 hours. Lipid Profile: No results for input(s): CHOL, HDL, LDLCALC, TRIG, CHOLHDL, LDLDIRECT in the last 72 hours. Thyroid Function Tests: No results for input(s): TSH, T4TOTAL, FREET4, T3FREE, THYROIDAB in the last 72 hours. Anemia Panel: No results for input(s): VITAMINB12, FOLATE, FERRITIN, TIBC, IRON, RETICCTPCT in the last 72 hours. Urine analysis:    Component Value Date/Time   COLORURINE YELLOW 06/23/2016 0831   APPEARANCEUR CLEAR 06/23/2016 0831   LABSPEC 1.023 06/23/2016 0831   PHURINE 5.0 06/23/2016 0831   GLUCOSEU NEGATIVE 06/23/2016 0831   GLUCOSEU NEGATIVE 07/17/2015 1148   HGBUR NEGATIVE 06/23/2016 0831   BILIRUBINUR NEGATIVE 06/23/2016 0831   KETONESUR NEGATIVE 06/23/2016 0831   PROTEINUR 100 (A) 06/23/2016 0831   UROBILINOGEN 0.2 07/17/2015 1148   NITRITE NEGATIVE 06/23/2016 0831   LEUKOCYTESUR NEGATIVE 06/23/2016 0831    Creatinine Clearance: Estimated Creatinine Clearance: 23.9 mL/min (by C-G formula based on SCr of 2.1 mg/dL (H)).  Sepsis Labs: @LABRCNTIP (procalcitonin:4,lacticidven:4) )No results found for this or any previous visit (from the past 240 hour(s)).   Radiological Exams on Admission: Dg Chest Portable 1 View  Result Date: 07/26/2016 CLINICAL DATA:  Shortness of breath tonight. EXAM: PORTABLE  CHEST 1 VIEW COMPARISON:  Radiograph 06/27/2016 FINDINGS: Chronic elevation of right hemidiaphragm. Improved bibasilar aeration with resolve it right lower lung zone opacity. Minimal persistent atelectasis at the left lung base. Cardiomediastinal contours are unchanged. No new focal airspace opacity. No pneumothorax. Unchanged osseous structures. IMPRESSION: Improved lung aeration from prior exam. Minimal residual left basilar atelectasis. Chronic elevation of the right hemidiaphragm. Electronically Signed   By: Jeb Levering M.D.   On: 07/26/2016 04:30    EKG: pending  Assessment/Plan Principal Problem:   Chest pain Active Problems:   Hyperlipidemia  HYPERTENSION, BENIGN   Chronic venous insufficiency   Alcoholic cirrhosis (HCC)   Hyperglycemia   Memory loss   CKD (chronic kidney disease) stage 3, GFR 30-59 ml/min   Iron deficiency anemia due to chronic blood loss   Venous stasis dermatitis of both lower extremities   Chronic combined systolic (congestive) and diastolic (congestive) heart failure (HCC)   NSTEMI (non-ST elevated myocardial infarction) ([place])   #1. Chest pain. Some typical and atypical features. Heart score 5. Initial troponin 0.40. Initial EKG reportedly with no acute changes but TWI and ST depression noted. Chest x-ray with minimal atelectasis improved lung aeration. Chest pain free to the point of admission. -Admit to telemetry -Cycle troponin -Serial EKG -Supportive therapy -Continue home meds -A cardiology consult.  #2. Chronic combined systolic and diastolic heart failure. BNP 999. Weight unclear at discharge 3 weeks ago. Likely close to 151lbs. todays weight 145 lbs. echo done last month revealed an EF of 45-50%, mild LVH, grade 1 diastolic dysfunction. followed by CHF team home medications include Demadex. Does not look particularly overloaded. -continue home meds  Plavix 75 mg daily, home dose  Increase Imdur 45 mg daily  Toprol 25 mg daily, home  dose   Demadex 20 mg daily, home dose -Patient not on ACEI/ARB most likely secondary to CKD -Initiate heparin drip given patient's multiple recent visits for cardiac diagnosis is to include NSTEMI -daily weights Filed Weights   07/26/16 0900  Weight: 68.5 kg (151 lb)  -monitor intake and output -defer further diuresis to cards -Will need assistance with outpatient maintenance due to his memory issues  #5.NSTEMI. Last month clear stress test without ischemia. Patient refused cardiac cath at that time. See #1 -Continue Plavix metoprolol and statin  #3. Chronic kidney disease. Stage III. Creatinine 2.1 on admission. Chart review indicates this is close to his baseline -Monitor urine output  #4. Anemia. History of ID a related to GI bleed. Hemoglobin 10.5 on admission. Review indicates this is his baseline No signs symptoms of active bleeding -Continue home meds  #6. History of alcoholic cirrhosis with associated bilateral LE edema and chronic stasis dermatitis. Not drinking -continue diruretics  #7. Memory loss. Chart review indicates he was evaluated by psychiatry August 21 and felt to have capacity. Clearly with short term memory issues. Currently lives [facility] -OP evaluation  -may need closer supervision/assistance to manage chf     DVT prophylaxis: Heparin drip  Code Status: full  Family Communication: none present  Disposition Plan: [facility]  Consults called: cardiology  Admission status: obs    Radene Gunning MD [facility]  If 7PM-7AM, please contact night-coverage [email or web address] Password [facility]  07/26/2016, 8:07 AM  Care during the described time interval was provided by NP Dyanne Carrel and myself .  I have reviewed this patient's available data, including medical history, events of note, physical examination, and all test results as part of my evaluation. I have personally reviewed and interpreted all radiology studies.

## 2016-07-26 NOTE — ED Notes (Signed)
PT states that if possible he would like a bed on [place].

## 2016-07-26 NOTE — ED Notes (Signed)
Ambulated pt with walker and O2 monitoring. Pt sat 98-95% on RA while ambulating around the nurses station.

## 2016-07-26 NOTE — Progress Notes (Signed)
Pt requesting to leave AMA.  Dr. Sherral Hammers has been notified.

## 2016-07-26 NOTE — ED Notes (Signed)
Spoke with Dyanne Carrel NP.

## 2016-07-26 NOTE — Consult Note (Addendum)
Cardiology Consultation   Patient ID: [name]; [id number]; 19-Oct-1929   Admit date: 07/26/2016 Date of Consult: 07/26/2016  Referring MD:  Dyanne Carrel, NP  Cardiologist: Dr. Lauree [name]  CHF:  Dr. Glori Bickers   Reason for Consultation: Chest pain, + Troponin, CHF  History of Present Illness: [name] is a 82 y.o. 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 EF had been normal in the past. Echo in 6/17 demonstrated moderate concentric LVH, EF 123456, mild diastolic dysfunction, mild aortic stenosis with mean gradient 14 mmHg and moderate AI and PASP 43 mmHg. There was no HOCM physiology noted.  He was last seen by Dr. Lauree [name] in 7/17 in clinic.    He was admitted 8/4-8/7 with chest pain, non-STEMI secondary to demand ischemia in the setting of CKD. Nuclear stress test demonstrated apical, inferior, septal and anterior wall scar but no ischemia. Echocardiogram demonstrated EF 45-50% with anteroseptal and apical hypokinesis. He was presumed to have an ischemic CM.  Notes indicate he declined undergoing cardiac catheterization.  He was admitted again 8/15-8/24 with decompensated, combined systolic and diastolic heart failure. He was followed by the heart failure team. He required augmentation of diuresis with milrinone. He was seen in follow-up in the CHF clinic 07/15/16. Torsemide dose had been reduced secondary to worsening renal function.  The patient presented to the ED this AM with complaints of chest pain and dyspnea.  Troponin is elevated at 0.4.  However, his Troponin levels have remained mildly elevated since 6/17.  CXR demonstrates improved lung aeration without acute changes.  BNP is 999.9 (improved from 06/23/16 when it was 1426).  Cardiology is asked to further evaluate.  The patient  lives in [place] in assisted living.  He tells me that he had a brief episode of left-sided chest discomfort. He did not take nitroglycerin. He is fairly sedentary. He is typically dyspneic with minimal activity. He has not noticed any significant change. He denies orthopnea or PND. He is not certain about his weight. He is not certain if his LE edema is any worse. He denies coughing or wheezing. Denies syncope. He is currently pain-free.   Prior cardiac studies:  Myoview 06/13/16  IMPRESSION: 1. Large region of infarcted myocardium involving the apex, inferior wall, septal wall and anterior wall. No reversible ischemia. 2. Septal dyskinesia. 3. Left ventricular ejection fraction 38% 4. Non invasive risk stratification*: Intermediate owing to LEFT ventricular dysfunction.  Echocardiogram 06/15/16 - Left ventricle: The cavity size was normal. Wall thickness was   increased in a pattern of mild LVH. Systolic function was mildly   reduced. The estimated ejection fraction was in the range of 45%   to 50%. There is hypokinesis of the anteroseptal and apical   myocardium. Doppler parameters are consistent with abnormal left   ventricular relaxation (grade 1 diastolic dysfunction). - Aortic valve: There was very mild stenosis. There was mild   regurgitation. Valve area (VTI): 2.87 cm^2. Valve area (Vmax):   2.96 cm^2. Valve area (Vmean): 2.62 cm^2. Mean gradient 10 mmHg,    peak gradient 17 mmHg  - Mitral valve: Calcified annulus. Impressions: - Limited study for LV function and AS; hypokinesis of the distal   anteroseptal and apical walls with overall mildly reduced LV   function; grade 1 diastolic dysfunction; calcified aortic valve   with very  mild AS and AI.  Past Medical History:  Diagnosis Date  . Acute combined systolic and diastolic CHF, NYHA class 1 ([place])   . Acute respiratory failure ([place])   . Alcoholic cirrhosis ([place])   . 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 Alcoholism  ([place])   . Aortic stenosis   . Ascites 11/2004   "early" SBP on paracentesis.   . Chronic gastritis 05/01/2016  . CKD (chronic kidney disease), stage III 10/11/2015  . Colon polyp perhaps in 1990s   pathology/type not known  . Dementia   . Diverticulosis of colon perhaps in 1990s  . Duodenal ulcer 04/2016  . Fracture of wrist 2014 spring vs early summer   refused orthopods suggestion to set the bone.   Marland Kitchen GERD (gastroesophageal reflux disease)   . HLD (hyperlipidemia)   . HTN (hypertension)   . Hypertrophic cardiomyopathy ([place])   . Portal hypertensive gastropathy 2006  . Renal insufficiency   . Rhabdomyolysis 01/2005    Past Surgical History:  Procedure Laterality Date  . BASAL CELL CARCINOMA EXCISION Left 11/2012   lower eyelid  . ESOPHAGOGASTRODUODENOSCOPY N/A 08/04/2013   Procedure: ESOPHAGOGASTRODUODENOSCOPY (EGD);  Surgeon: Ladene Artist, MD;  Location: [facility] ENDOSCOPY;  Service: Endoscopy;  Laterality: N/A;  . ESOPHAGOGASTRODUODENOSCOPY (EGD) WITH PROPOFOL N/A 05/01/2016   Procedure: ESOPHAGOGASTRODUODENOSCOPY (EGD) WITH PROPOFOL;  Surgeon: Jerene Bears, MD;  Location: [facility] ENDOSCOPY;  Service: Endoscopy;  Laterality: N/A;  . INGUINAL HERNIA REPAIR    . MELANOMA EXCISION Left 02/2011   malignant melanoma of thigh      Home Meds: Prior to Admission medications   Medication Sig Start Date End Date Taking? Authorizing Provider  acetaminophen (TYLENOL) 325 MG tablet Take 650 mg by mouth every 6 (six) hours as needed for pain.   Yes Historical Provider, MD  ALPRAZolam (XANAX) 0.25 MG tablet Take 1 tablet (0.25 mg total) by mouth at bedtime as needed for anxiety. 07/02/16  Yes Jonetta Osgood, MD  aspirin EC 81 MG EC tablet Take 1 tablet (81 mg total) by mouth daily. 06/15/16  Yes Robbie Lis, MD  clopidogrel (PLAVIX) 75 MG tablet Take 1 tablet (75 mg total) by mouth daily. 06/15/16  Yes Robbie Lis, MD  isosorbide mononitrate (IMDUR) 30 MG 24 hr tablet Take 30 mg by mouth daily.    Yes Historical Provider, MD  metoprolol succinate (TOPROL-XL) 25 MG 24 hr tablet Take 2 tablets (50 mg total) by mouth daily. Take with or immediately following a meal. 07/17/16  Yes [name], PA-C  mirtazapine (REMERON) 15 MG tablet Take 0.5 tablets (7.5 mg total) by mouth at bedtime. 07/24/16  Yes Biagio Borg, MD  Multiple Vitamin (MULTIVITAMIN WITH MINERALS) TABS Take 1 tablet by mouth daily.   Yes Historical Provider, MD  pantoprazole (PROTONIX) 40 MG tablet Take 1 tablet (40 mg total) by mouth 2 (two) times daily. 05/02/16  Yes Clanford Marisa Hua, MD  potassium chloride 20 MEQ TBCR Take 20 mEq by mouth 2 (two) times daily. 07/02/16  Yes [name], MD  simvastatin (ZOCOR) 20 MG tablet Take 20 mg by mouth daily.   Yes Historical Provider, MD  torsemide (DEMADEX) 20 MG tablet Take 1 tablet (20 mg total) by mouth daily. 07/15/16  Yes [name], PA-C    Current Medications: . clopidogrel  75 mg Oral Daily  . furosemide  40 mg Intravenous BID  . isosorbide mononitrate  45 mg Oral Daily  . metoprolol succinate  50 mg Oral Daily  . mirtazapine  7.5 mg Oral QHS  . pantoprazole  40 mg Oral BID  . simvastatin  20 mg Oral QHS     Allergies:   No Known Allergies  Social History:   The patient  reports that he has never smoked. He has never used smokeless tobacco. He reports that he does not drink alcohol or use drugs.    Family History:   The patient's family history includes Cancer (age of onset: 70) in his father; Heart disease in his mother and sister.   ROS:  Please see the history of present illness.  Review of Systems  Cardiovascular: Positive for chest pain and leg swelling. Negative for palpitations.  Respiratory: Negative for cough and wheezing.   Skin: Negative for rash.  Gastrointestinal: Negative for bloating, hematochezia and melena.  Genitourinary: Negative for hematuria.  Neurological: Negative for dizziness.   All other ROS reviewed and  negative.      Vital Signs: Blood pressure (!) 154/66, pulse 84, temperature 98 F (36.7 C), temperature source Oral, resp. rate 18, height 5\' 8"  (1.727 m), weight 151 lb (68.5 kg), SpO2 100 %.   PHYSICAL EXAM: General:  Well nourished, well developed, in no acute distress HEENT: normal Lymph: no adenopathy Neck: JVP ~ 7 cm Endocrine:  No thryomegaly Vascular: DP/PT 2+ bilaterally  Cardiac:  Normal S1-S2, RRR, harsh 2/6 systolic murmur heard best at the RUSB, 2/6 holosystolic murmur along the LSB  Lungs:  Decreased breath sounds at the pacer crackles, no wheezing Abd: soft, nontender, no hepatomegaly  Ext: 1-2+ bilateral LE edema compression stockings in place,  Musculoskeletal:  No deformities Skin: warm and dry  Neuro:  CNs 2-12 intact, no focal abnormalities noted Psych:  Normal affect    EKG:  NSR, HR 94, RBBB, no change from prior tracing  Labs:  Recent Labs  07/26/16 0447  TROPONINI 0.40*    Recent Labs  06/12/16 0556 06/12/16 0900 06/12/16 1420 06/12/16 2014 06/13/16 0035 06/23/16 0705 07/26/16 0447  TROPONINI <0.03 0.05* 0.42* 0.81* 0.75* 0.13* 0.40*     Lab Results  Component Value Date   WBC 8.2 07/26/2016   HGB 10.5 (L) 07/26/2016   HCT 31.0 (L) 07/26/2016   MCV 97.7 07/26/2016   PLT 248 07/26/2016    Recent Labs  06/29/16 0554 07/08/16 07/10/16 07/26/16 0447 07/26/16 0453  HGB 9.7* 15.5 10.4* 10.3* 10.5*     Recent Labs Lab 07/26/16 0453  NA 143  K 5.1  CL 111  BUN 48*  CREATININE 2.10*  GLUCOSE 139*    Recent Labs  07/02/16 0542 07/08/16 07/11/16 07/15/16 1106 07/26/16 0453  CREATININE 2.36* 2.1* 2.6* 2.17* 2.10*     No results found for: DDIMER  Radiology/Studies:   Dg Chest Portable 1 View  Result Date: 07/26/2016 CLINICAL DATA:  Shortness of breath tonight. EXAM: PORTABLE CHEST 1 VIEW COMPARISON:  Radiograph 06/27/2016 FINDINGS: Chronic elevation of right hemidiaphragm. Improved bibasilar aeration with resolve it  right lower lung zone opacity. Minimal persistent atelectasis at the left lung base. Cardiomediastinal contours are unchanged. No new focal airspace opacity. No pneumothorax. Unchanged osseous structures. IMPRESSION: Improved lung aeration from prior exam. Minimal residual left basilar atelectasis. Chronic elevation of the right hemidiaphragm. Electronically Signed   By: Jeb Levering M.D.   On: 07/26/2016 04:30    PROBLEM LIST:  Principal Problem:   Chest pain Active Problems:   Chronic combined systolic (congestive) and diastolic (congestive) heart failure (HCC)  CKD (chronic kidney disease) stage 3, GFR 30-59 ml/min   NSTEMI (non-ST elevated myocardial infarction) (HCC)   CAD (coronary artery disease)   Protein calorie malnutrition (HCC)   Hyperlipidemia   HYPERTENSION, BENIGN   Chronic venous insufficiency   Alcoholic cirrhosis (HCC)   Aortic stenosis, mild   Hyperglycemia   Memory loss   Iron deficiency anemia due to chronic blood loss   Venous stasis dermatitis of both lower extremities    ASSESSMENT AND PLAN:  1. Chest pain with mildly elevated Troponin - He presents with brief episode of L sided discomfort.  He is pain free now.  ECG is unchanged.  He has mildly elevated Troponin levels.  He has has mildly elevated Troponin levels over recent previous admissions.  In the setting of CKD, it is hard to know if this represents ACS, demand ischemia or chronic elevation.  Cardiac cath was apparently discussed during prior admissions, but the patient declined.  He is certainly at high risk for contrast induced nephropathy.  He has a hx of PUD and UGI bleeding this year as well.  Given his comorbid illnesses, he is at high risk for cardiac cath.  Will follow serial enzymes.  Agree with increasing Isosorbide to 45 mg QD.  2. Chronic Combined systolic and diastolic CHF -  He appears to be volume overloaded.  Will give him Lasix 40 IV bid today.  Consider changing to Torsemide 20 bid  tomorrow. BMET in AM.    3. CAD - Presumed based upon Nuc Study in 8/17 with likely LAD infarct but no ischemia.  As noted, Troponin is mildly elevated.  Continue Plavix, Nitrates, beta-blocker, statin.  Follow serial enzymes.    4. CKD - GFR is 24.91 >> Stage 4. Creatinine fairly stable.  Will repeat BMET tomorrow to closely follow renal function and K+ given diuresis.    5. Anemia - Hgb stable.  Per IM.   6. Protein Calorie Malnutrition - Alb was 3 in 8/17.  Likely contributing to edema somewhat.    7. Aortic stenosis - Mild by recent echo.     Signed, Richardson Dopp, PA-C  07/26/2016 9:52 AM    The patient was seen, examined and discussed with Richardson Dopp, PA-C and I agree with the above.   [name] is a 82 y.o. 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 EF had been normal in the past. Echo in 6/17 moderate concentric LVH, EF 123456, mild diastolic dysfunction, mild aortic stenosis with mean gradient 14 mmHg and moderate AI and PASP 43 mmHg. There was no HOCM physiology noted.  He was last seen by Dr. Lauree [name] in 7/17 in clinic.   Admission 8/4-8/7 with chest pain, non-STEMI secondary to demand ischemia in the setting of CKD. Nuclear stress test demonstrated a large apical, inferior, septal and anterior wall scar but no ischemia. Echocardiogram demonstrated EF 45-50% with anteroseptal and apical hypokinesis. He was presumed to have an ischemic CM.  Notes indicate he declined undergoing cardiac catheterization.  He was admitted again 8/15-8/24 acute on chronic CHF, combined systolic and diastolic. He was followed by the heart failure team. He required augmentation of diuresis with milrinone. He was seen in follow-up in the CHF clinic 07/15/16. Torsemide dose had been reduced secondary to worsening renal  function.  He presented with a brief left sided  chest pain that has resolved, also mildly worsening DOE, troponin is elevated at 0.4, the last visit 0.8-->, Crea 2.1 .  However, his baseline 1.6-1.8, but in August up to 2.7. BNP is 999.9 (on 06/23/16 it was 1426).  Admission weight 151 lbs, that was also his discharge weight on 07/01/16.  We will give lasix 40 mg iv BID x 2 doses and reevaluate in the am, possible discharge in the morning. BP is elevated, I would increase imdur to 60 mg po daily.   Ena Dawley, MD 07/26/2016  Addendum:   The patient is really wishing to go home today, his pain is resolved,however his second troponin is up from 0.4 --> 1.0, we will start troponin, keep overnight and rediscuss the plan in the am, he has refused catheterization in the past, what is reasonable given his acute on chronic kidney failure.   Ena Dawley, MD 07/26/2016

## 2016-07-26 NOTE — Progress Notes (Signed)
Per AD and pt, pt prefers to be transferred to [facility].

## 2016-07-26 NOTE — ED Notes (Signed)
NP at bedside.

## 2016-07-26 NOTE — ED Triage Notes (Signed)
Pt called EMS for [facility]. Pt is a CHF'er

## 2016-07-26 NOTE — Progress Notes (Signed)
ANTICOAGULATION CONSULT NOTE - Initial Consult  Pharmacy Consult for heparin Indication: chest pain/ACS  No Known Allergies  Patient Measurements: Height: 5\' 8"  (172.7 cm) Weight: 151 lb (68.5 kg) IBW/kg (Calculated) : 68.4 Heparin Dosing Weight: 68.5 kg   Vital Signs: Temp: 98 F (36.7 C) (09/17 0900) Temp Source: Oral (09/17 0900) BP: 154/66 (09/17 0900) Pulse Rate: 84 (09/17 0900)  Labs:  Recent Labs  07/26/16 0447 07/26/16 0453  HGB 10.3* 10.5*  HCT 33.3* 31.0*  PLT 248  --   CREATININE  --  2.10*  TROPONINI 0.40*  --     Estimated Creatinine Clearance: 24.9 mL/min (by C-G formula based on SCr of 2.1 mg/dL (H)).   Medical History: Past Medical History:  Diagnosis Date  . Acute combined systolic and diastolic CHF, NYHA class 1 ([place])   . Acute respiratory failure ([place])   . Alcoholic cirrhosis ([place])   . 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 Alcoholism ([place])   . Aortic stenosis   . Ascites 11/2004   "early" SBP on paracentesis.   . Chronic gastritis 05/01/2016  . CKD (chronic kidney disease), stage III 10/11/2015  . Colon polyp perhaps in 1990s   pathology/type not known  . Dementia   . Diverticulosis of colon perhaps in 1990s  . Duodenal ulcer 04/2016  . Fracture of wrist 2014 spring vs early summer   refused orthopods suggestion to set the bone.   Marland Kitchen GERD (gastroesophageal reflux disease)   . HLD (hyperlipidemia)   . HTN (hypertension)   . Hypertrophic cardiomyopathy ([place])   . Portal hypertensive gastropathy 2006  . Renal insufficiency   . Rhabdomyolysis 01/2005    Medications:  Scheduled:  . clopidogrel  75 mg Oral Daily  . isosorbide mononitrate  45 mg Oral Daily  . metoprolol succinate  50 mg Oral Daily  . mirtazapine  7.5 mg Oral QHS  . pantoprazole  40 mg Oral BID  . simvastatin  20 mg Oral QHS  . torsemide  20 mg Oral Daily    Assessment: [name] is an 82 yo male who presented to the ED after experiencing diffuse left-anterior  non-radiating chest pain last evening. Chest pain had resolved when he got to the ED. Heparin is being started due to multiple recent visits for cardiac diagnosis. Troponin 0.40. CBC is low but stable, platelets are within normal limits. Patient does not use any anticoagulants at home.  Patient has a history of cirrhosis and CKD. Last INR- 1.03 on 06/12/16. Will re-check.   Will initiate therapy with a lower bolus due to patient's history.    Goal of Therapy:  Heparin level 0.3-0.7 units/ml Monitor platelets by anticoagulation protocol: Yes   Plan:  Heparin 3000 unit bolus Heparin 800 units/hr  8 hour heparin level Daily heparin level/CBC  Pasty Spillers D Pharmacy Resident [phone] 07/26/2016,9:45 AM

## 2016-07-26 NOTE — ED Notes (Signed)
Attempting to page Dyanne Carrel NP.

## 2016-07-27 ENCOUNTER — Other Ambulatory Visit: Payer: Self-pay | Admitting: Internal Medicine

## 2016-07-27 ENCOUNTER — Other Ambulatory Visit (HOSPITAL_COMMUNITY): Payer: Self-pay | Admitting: Internal Medicine

## 2016-07-28 ENCOUNTER — Emergency Department (HOSPITAL_COMMUNITY): Payer: Medicare Other

## 2016-07-28 ENCOUNTER — Encounter (HOSPITAL_COMMUNITY): Payer: Self-pay | Admitting: Emergency Medicine

## 2016-07-28 ENCOUNTER — Emergency Department (HOSPITAL_COMMUNITY)
Admission: EM | Admit: 2016-07-28 | Discharge: 2016-07-28 | Discharge status: Against Medical Advice | Payer: Medicare Other | Attending: Emergency Medicine | Admitting: Emergency Medicine

## 2016-07-28 ENCOUNTER — Encounter: Payer: Self-pay | Admitting: Internal Medicine

## 2016-07-28 ENCOUNTER — Non-Acute Institutional Stay (SKILLED_NURSING_FACILITY): Payer: Medicare Other | Admitting: Internal Medicine

## 2016-07-28 DIAGNOSIS — F0390 Unspecified dementia without behavioral disturbance: Secondary | ICD-10-CM | POA: Diagnosis not present

## 2016-07-28 DIAGNOSIS — R778 Other specified abnormalities of plasma proteins: Secondary | ICD-10-CM

## 2016-07-28 DIAGNOSIS — R0602 Shortness of breath: Secondary | ICD-10-CM | POA: Diagnosis not present

## 2016-07-28 DIAGNOSIS — R079 Chest pain, unspecified: Secondary | ICD-10-CM

## 2016-07-28 DIAGNOSIS — I5043 Acute on chronic combined systolic (congestive) and diastolic (congestive) heart failure: Secondary | ICD-10-CM | POA: Diagnosis not present

## 2016-07-28 DIAGNOSIS — L03116 Cellulitis of left lower limb: Secondary | ICD-10-CM | POA: Insufficient documentation

## 2016-07-28 DIAGNOSIS — K7030 Alcoholic cirrhosis of liver without ascites: Secondary | ICD-10-CM

## 2016-07-28 DIAGNOSIS — Z79899 Other long term (current) drug therapy: Secondary | ICD-10-CM | POA: Diagnosis not present

## 2016-07-28 DIAGNOSIS — I129 Hypertensive chronic kidney disease with stage 1 through stage 4 chronic kidney disease, or unspecified chronic kidney disease: Secondary | ICD-10-CM | POA: Insufficient documentation

## 2016-07-28 DIAGNOSIS — N183 Chronic kidney disease, stage 3 unspecified: Secondary | ICD-10-CM

## 2016-07-28 DIAGNOSIS — I214 Non-ST elevation (NSTEMI) myocardial infarction: Secondary | ICD-10-CM

## 2016-07-28 DIAGNOSIS — R0789 Other chest pain: Secondary | ICD-10-CM | POA: Diagnosis not present

## 2016-07-28 DIAGNOSIS — R7989 Other specified abnormal findings of blood chemistry: Secondary | ICD-10-CM

## 2016-07-28 DIAGNOSIS — J9611 Chronic respiratory failure with hypoxia: Secondary | ICD-10-CM

## 2016-07-28 DIAGNOSIS — E43 Unspecified severe protein-calorie malnutrition: Secondary | ICD-10-CM | POA: Diagnosis not present

## 2016-07-28 DIAGNOSIS — L03115 Cellulitis of right lower limb: Secondary | ICD-10-CM | POA: Insufficient documentation

## 2016-07-28 DIAGNOSIS — Z7982 Long term (current) use of aspirin: Secondary | ICD-10-CM | POA: Insufficient documentation

## 2016-07-28 LAB — BASIC METABOLIC PANEL
Anion gap: 9 (ref 5–15)
Anion gap: 5 (ref 5–15)
BUN: 51 mg/dL — AB (ref 6–20)
BUN: 50 mg/dL — AB (ref 6–20)
CALCIUM: 8.8 mg/dL — AB (ref 8.9–10.3)
CO2: 19 mmol/L — ABNORMAL LOW (ref 22–32)
CO2: 22 mmol/L (ref 22–32)
CREATININE: 2.33 mg/dL — AB (ref 0.61–1.24)
CREATININE: 2.18 mg/dL — AB (ref 0.61–1.24)
Calcium: 8.6 mg/dL — ABNORMAL LOW (ref 8.9–10.3)
Chloride: 115 mmol/L — ABNORMAL HIGH (ref 101–111)
Chloride: 116 mmol/L — ABNORMAL HIGH (ref 101–111)
GFR calc Af Amer: 30 mL/min — ABNORMAL LOW (ref >60)
GFR calc Af Amer: 28 mL/min — ABNORMAL LOW (ref >60)
GFR, EST NON AFRICAN AMERICAN: 24 mL/min — AB (ref >60)
GFR, EST NON AFRICAN AMERICAN: 26 mL/min — AB (ref >60)
GLUCOSE: 140 mg/dL — AB (ref 65–99)
GLUCOSE: 132 mg/dL — AB (ref 65–99)
POTASSIUM: 5.4 mmol/L — AB (ref 3.5–5.1)
Potassium: 6.3 mmol/L (ref 3.5–5.1)
SODIUM: 143 mmol/L (ref 135–145)
Sodium: 143 mmol/L (ref 135–145)

## 2016-07-28 LAB — CBC
HEMATOCRIT: 33.2 % — AB (ref 39.0–52.0)
Hemoglobin: 10.3 g/dL — ABNORMAL LOW (ref 13.0–17.0)
MCH: 30.5 pg (ref 26.0–34.0)
MCHC: 31 g/dL (ref 30.0–36.0)
MCV: 98.2 fL (ref 78.0–100.0)
PLATELETS: 243 10*3/uL (ref 150–400)
RBC: 3.38 MIL/uL — ABNORMAL LOW (ref 4.22–5.81)
RDW: 14.3 % (ref 11.5–15.5)
WBC: 8.4 10*3/uL (ref 4.0–10.5)

## 2016-07-28 LAB — I-STAT CG4 LACTIC ACID, ED: LACTIC ACID, VENOUS: 0.97 mmol/L (ref 0.5–1.9)

## 2016-07-28 LAB — BRAIN NATRIURETIC PEPTIDE: B Natriuretic Peptide: 780.5 pg/mL — ABNORMAL HIGH (ref 0.0–100.0)

## 2016-07-28 LAB — I-STAT TROPONIN, ED: TROPONIN I, POC: 0.83 ng/mL — AB (ref 0.00–0.08)

## 2016-07-28 MED ORDER — FUROSEMIDE 10 MG/ML IJ SOLN
40.0000 mg | Freq: Once | INTRAMUSCULAR | Status: AC
  Administered 2016-07-28: 40 mg via INTRAVENOUS
  Filled 2016-07-28: qty 4

## 2016-07-28 MED ORDER — ASPIRIN 81 MG PO CHEW: 324.0000 mg | CHEWABLE_TABLET | Freq: Once | ORAL | Status: DC

## 2016-07-28 MED ORDER — SULFAMETHOXAZOLE-TRIMETHOPRIM 800-160 MG PO TABS: 1.0000 | ORAL_TABLET | Freq: Two times a day (BID) | ORAL | 0 refills | Status: DC

## 2016-07-28 MED ORDER — VANCOMYCIN HCL 10 G IV SOLR
1250.0000 mg | Freq: Once | INTRAVENOUS | Status: AC
  Administered 2016-07-28: 1250 mg via INTRAVENOUS
  Filled 2016-07-28: qty 1250

## 2016-07-28 NOTE — ED Notes (Signed)
BMP redrawn & sent to main lab

## 2016-07-28 NOTE — ED Triage Notes (Signed)
Per [facility] pt coming from [facility]. Pt is having L sided CP that radiates to L shoulder and describes it as an achy pain. Some respiratory distress noted through FD started on 4L [place]. EMS gave 324 ASA and 1 NTG and CP resolved after NTG. Pt does not appear in distress upon arrival. EKG with EMS showed RBBB.

## 2016-07-28 NOTE — Progress Notes (Signed)
Patient seen and evaluated at bedside.  81 yo M with elevated troponin, BLE erythema (cellulitis vs venous stasis), chronic BLE swelling.  Patient left AMA less than 48 hours ago.  Trying to admit patient for observation given elevated troponin (although he is chest pain free), and cardiology consult in AM.  Patient however, declines admission and states he will leave AMA.  I am unable to convince patient otherwise, and while he does have some dementia and difficulty with memory, he does have decision making capacity in both my opinion and the [place] opinion.  As a result we have no choice but to allow him to sign out AMA.

## 2016-07-28 NOTE — ED Notes (Signed)
Notified [facility] for transportation back home 

## 2016-07-28 NOTE — ED Notes (Signed)
Lactic acid re drawn.

## 2016-07-28 NOTE — Progress Notes (Signed)
Patient ID: [name], male   DOB: April 28, 1929, 82 y.o.   MRN: [id number]  Provider:  Rexene Edison. Mariea Clonts, D.O., C.M.D. Location:  [place] Room Number: 141 Place of Service:  SNF (31)  PCP: [name], MD Patient Care Team: Biagio Borg, MD as PCP - General (Internal Medicine) Hillary Bow, MD (Cardiology) Sable Feil, MD (Gastroenterology) Luberta Mutter, MD (Ophthalmology)  Extended Emergency Contact Information Primary Emergency Contact: Salomon Mast [place] Phone: [phone] Relation: Son Secondary Emergency Contact: [place] Mobile Phone: [phone] Relation: Son  Code Status: FULL CODE, but has signed previous paperwork to be DNR and living will indicates wish for DNR status--to be discussed later this afternoon more with his family--he was noted to be considered capable of making such decisions during his hospital stay, but seems to lack insight into his condition at this time   Goals of Care: Advanced Directive information Advanced Directives 07/28/2016  Does patient have an advance directive? No  Type of Advance Directive -  Does patient want to make changes to advanced directive? -  Copy of advanced directive(s) in chart? -  Would patient like information on creating an advanced directive? No - patient declined information  Pre-existing out of facility DNR order (yellow form or pink MOST form) -  Some encounter information is confidential and restricted. Go to Review Flowsheets activity to see all data.   Chief Complaint  Patient presents with  . New Admit To SNF    Rehab admission    HPI: Patient is a 82 y.o. male with ischemic cardiomyopathy, CHF, CKD, aortic stenosis, cirrhosis seen today for admission to [facility] s/p his latest two hospital visits. He had been here after his hospitalization 8/15-8/24/17 for CHF and HCAP.  Just one week prior to  that hospitalization, he'd refused cath when his myoview study showed evidence of prior infarct w/o reversible ischemia.  He was having subacute delirium when I saw him and dehydration when seen by NP due to poor po intake.  He saw PA Jonni Sanger with Dr. Haroldine Laws in f/u on 07/15/16 and he noted that his creatinine remained elevated, but improved from hospitalization and recommended a repeat bmp and if still elevated, reduce torsemide to 40 in am and 20 in pm.  Torsemide was decreased to 20mg  daily.  9/8 metoprolol was decreased to 50mg . He had improved clinically with PT, OT and was discharged home 07/20/16 with home caregivers and medication management at his adamant request.   9/14, he saw Dr. Fuller Plan at [facility] to f/u on his heme positive stool and anemia in June (from his hospitalization then with NSTEMI).  At that time, his asa and nsaid were d/cd and he was put on PPI indefinitely.  The EGD in June had showed duodenal ulcers and he was continued on the protonix 40mg  daily and ok'd to take baby asa only.  No nsaids.  No varices were seen and he was to f/u in 07/2017 for a possible cscope--he declined on 9/14.    9/15, remeron was sent into his pharmacy by his PCP office.  9/17, pt called EMS for shortness of breath.  He admitted to left sided chest pain the night before that worried him.  He was having orthopnea.  There, he had rales bilaterally up to mid chest and worse on right then left and he had edema and a skin rash.  BNP was 999.9.  Troponin I  was 0.40.  Creatinine was stable at 2.10, BUN 48.  EKG showed rate on 90, NSR, nonspecific ST changes and depression laterally, RBBB (ST depression new vs. Old EKG).  CXR showed improved lung aeration from prior exam, minimal residual left basilar atelectasis, chronic elevation of right hemidiaphragm.  He refused hospital admission for NSTEMI.  Cr Cl was 23.9 by C/G formula.  He was then seen for admission with plans for telemetry, troponins, serial ekgs, supportive  therapy, home meds and cardiology consult.  Wt was 145 lbs.  Heparin drip was initiated and daily weights, I/Os.  His short term memory problems were again noted.  He was seen by Dr. Meda Coffee from cardiology who noted his troponins have been slightly elevated since 6/17.  She noted he was high risk for contrast induced nephropathy if he were to have a cath and his comorbidities also made him high risk.  Isosorbide was increased to 45mg  daily.  He got lasix 40mg  IV bid and then torsemide was to be changed to 20mg  po bid on 9/18 with a BMP that am.  His second troponin actually went up to 1.0 so she wanted to keep him overnight.  Next troponin was 1.04. He refused to stay overnight or for further treatment despite rising troponin and left AMA after heparin stopped.  9/19 (early this am), pt returned to the ED with left sided chest pain radiating to his left shoulder which was "achy" and associated with some respiratory distress.  He'd been started on 4L [place] O2, got full dose asa, 1 NTG, and pain did resolve.  His EKG showed RBBB by EMS.  He reported shaking chills, and had bilateral lower extremity swelling and redness, but no pain in his legs.   Dr. Aundra Dubin from cardiology recommended he be admitted to medicine for diuresis with IV lasix.  Cardiology would consult in the am.  His CXR was clear.  He declined admission again and signed out AMA and returned back here.  Apparently, he has been asking to go home ever since.  He is adamant with nursing about maintaining a full code status despite refusing further care for his NSTEMI.  Last troponins have been 0.71 and 0.83.  Lactic acid was 0.97. Blood cultures were drawn and pending.  K was 5.4, cr 2.18 with GFR 26 and BUN 50.   BNP was better at 780.5 down from 999.9 two days before.  He did receive vanc x 1.    When seen, pt denies having had a heart attack.  We reviewed the above hospital visits, labs, tests, ekgs, etc.  I spoke with his son by phone also about what  is going on.  Pt is demanding to return home, but family wants him to stay and staff feel he is safer here in rehab.  He was calling staff frequently to his apt for the chest pain episodes.  He also continues to request full code status after a detailed explanation of the severity of his current medical condition.  He continues to appear dyspneic even sitting in his chair and certainly when using the restroom.  He also does not think he needs PT, OT, but his family would like for him to participate.  Past Medical History:  Diagnosis Date  . Acute combined systolic and diastolic CHF, NYHA class 1 ([place])   . Acute respiratory failure ([place])   . Alcoholic cirrhosis ([place])   . 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 Alcoholism ([place])   . Aortic stenosis   .  Ascites 11/2004   "early" SBP on paracentesis.   . Chronic gastritis 05/01/2016  . CKD (chronic kidney disease), stage III 10/11/2015  . Colon polyp perhaps in 1990s   pathology/type not known  . Dementia   . Diverticulosis of colon perhaps in 1990s  . Duodenal ulcer 04/2016  . Fracture of wrist 2014 spring vs early summer   refused orthopods suggestion to set the bone.   Marland Kitchen GERD (gastroesophageal reflux disease)   . HLD (hyperlipidemia)   . HTN (hypertension)   . Hypertrophic cardiomyopathy ([place])   . Portal hypertensive gastropathy 2006  . Renal insufficiency   . Rhabdomyolysis 01/2005   Past Surgical History:  Procedure Laterality Date  . BASAL CELL CARCINOMA EXCISION Left 11/2012   lower eyelid  . ESOPHAGOGASTRODUODENOSCOPY N/A 08/04/2013   Procedure: ESOPHAGOGASTRODUODENOSCOPY (EGD);  Surgeon: Ladene Artist, MD;  Location: [facility] ENDOSCOPY;  Service: Endoscopy;  Laterality: N/A;  . ESOPHAGOGASTRODUODENOSCOPY (EGD) WITH PROPOFOL N/A 05/01/2016   Procedure: ESOPHAGOGASTRODUODENOSCOPY (EGD) WITH PROPOFOL;  Surgeon: Jerene Bears, MD;  Location: [facility] ENDOSCOPY;  Service: Endoscopy;  Laterality: N/A;  . INGUINAL HERNIA REPAIR    . MELANOMA  EXCISION Left 02/2011   malignant melanoma of thigh    reports that he has never smoked. He has never used smokeless tobacco. He reports that he does not drink alcohol or use drugs. Social History   Social History  . Marital status: Married    Spouse name: N/A  . Number of children: 4  . Years of education: 24   Occupational History  . pathologist Retired    retired   Social History Main Topics  . Smoking status: Never Smoker  . Smokeless tobacco: Never Used  . Alcohol use No     Comment: member of AA  . Drug use: No  . Sexual activity: Not on file   Other Topics Concern  . 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  Remains independent. ACP- not discussed, will approach at next OV     Family History  Problem Relation Age of Onset  . Cancer Father 31    lung cancer  . Heart disease Mother   . Heart disease Sister     Health Maintenance  Topic Date Due  . PNA vac Low Risk Adult (2 of 2 - PPSV23) 06/15/2015  . INFLUENZA VACCINE  06/09/2016  . TETANUS/TDAP  06/14/2024  . ZOSTAVAX  Completed    No Known Allergies    Medication List       Accurate as of 07/28/16 10:13 AM. Always use your most recent med list.          acetaminophen 325 MG tablet Commonly known as:  TYLENOL Take 650 mg by mouth every 6 (six) hours as needed for pain.   ALPRAZolam 0.25 MG tablet Commonly known as:  XANAX Take 1 tablet (0.25 mg total) by mouth at bedtime as needed for anxiety.   aspirin 81 MG EC tablet Take 1 tablet (81 mg total) by mouth daily.   clopidogrel 75 MG tablet Commonly known as:  PLAVIX TAKE ONE TABLET EACH DAY   isosorbide mononitrate 30 MG 24 hr tablet Commonly known as:  IMDUR Take 30 mg by mouth daily.   metoprolol succinate 50 MG 24 hr tablet Commonly known as:  TOPROL-XL TAKE ONE TABLET EACH DAY   mirtazapine 15 MG tablet Commonly known as:  REMERON Take 0.5 tablets (7.5 mg total)  by mouth  at bedtime.   multivitamin with minerals Tabs tablet Take 1 tablet by mouth daily.   pantoprazole 40 MG tablet Commonly known as:  PROTONIX TAKE ONE TABLET TWICE DAILY   potassium chloride SA 20 MEQ tablet Commonly known as:  K-DUR,KLOR-CON TAKE ONE TABLET EACH DAY   simvastatin 20 MG tablet Commonly known as:  ZOCOR Take 20 mg by mouth daily.   sulfamethoxazole-trimethoprim 800-160 MG tablet Commonly known as:  BACTRIM DS,SEPTRA DS Take 1 tablet by mouth 2 (two) times daily.   torsemide 20 MG tablet Commonly known as:  DEMADEX TAKE ONE TABLET EACH DAY       Review of Systems  Constitutional: Negative for activity change and appetite change.  HENT: Positive for hearing loss.   Eyes: Negative for visual disturbance.  Respiratory: Negative for cough, chest tightness and shortness of breath.   Cardiovascular: Negative for chest pain, palpitations and leg swelling.  Gastrointestinal: Negative for abdominal pain and constipation.  Genitourinary: Positive for frequency. Negative for dysuria.  Musculoskeletal: Positive for gait problem.  Skin: Negative for color change.  Neurological: Negative for weakness and light-headedness.  Psychiatric/Behavioral: Positive for confusion. Negative for sleep disturbance.    Vitals:   07/28/16 0951  BP: (!) 158/79  Pulse: 82  Resp: 18  Temp: (!) 96.9 F (36.1 C)  TempSrc: Oral  Weight: 148 lb (67.1 kg)  Height: 5\' 8"  (1.727 m)   Body mass index is 22.5 kg/m. Physical Exam  Constitutional: He is oriented to person, place, and time.  Frail white male seated in recliner chair in rehab room  Cardiovascular: Normal rate and regular rhythm.   Murmur heard. 3/6 systolic murmur in aortic area but audible throughout precordium  Pulmonary/Chest: Breath sounds normal. He has no rales.  Notably increased work of breathing while sitting in chair (denies), worsened with walking to restroom  Abdominal: Soft. Bowel sounds are normal.    Musculoskeletal: Normal range of motion.  Walks with walker  Neurological: He is alert and oriented to person, place, and time.  Scored 26/30 on mmse, passed clock (2 pt decrease from last rehab stay)  Skin: Skin is warm and dry.  Psychiatric:  Flat affect, lacks insight into his medical problems--seems to forget what has happened or in denial about his poor health    Labs reviewed: Basic Metabolic Panel:  Recent Labs  06/30/16 0508  07/02/16 0542  07/15/16 1106 07/26/16 0453 07/28/16 0010 07/28/16 0311  NA 138  < > 138  < > 137 143 143 143  K 3.6  < > 3.7  < > 4.6 5.1 6.3* 5.4*  CL 98*  < > 97*  --  104 111 116* 115*  CO2 30  < > 32  --  24  --  22 19*  GLUCOSE 137*  < > 107*  --  147* 139* 140* 132*  BUN 76*  < > 80*  < > 87* 48* 51* 50*  CREATININE 2.67*  < > 2.36*  < > 2.17* 2.10* 2.33* 2.18*  CALCIUM 8.5*  < > 8.8*  --  9.0  --  8.8* 8.6*  MG 1.6*  --  1.9  --  1.9  --   --   --   < > = values in this interval not displayed. Liver Function Tests:  Recent Labs  04/13/16 1046 04/30/16 0422 06/12/16 0556  AST 21 19 24   ALT 17 15* 15*  ALKPHOS 75 63 85  BILITOT 0.7 0.6  0.6  PROT 6.7 5.5* 6.0*  ALBUMIN 3.5 2.7* 3.0*   No results for input(s): LIPASE, AMYLASE in the last 8760 hours. No results for input(s): AMMONIA in the last 8760 hours. CBC:  Recent Labs  05/28/16 1556 06/12/16 0556 06/23/16 0705  06/29/16 0554  07/10/16 07/26/16 0447 07/26/16 0453 07/28/16 0010  WBC 9.7 8.3 16.2*  < > 9.8  < > 9.5 8.2  --  8.4  NEUTROABS 7.5 5.8 12.1*  --   --   --   --   --   --   --   HGB 11.6* 10.3* 11.1*  < > 9.7*  < > 10.4* 10.3* 10.5* 10.3*  HCT 35.3* 33.3* 35.2*  < > 31.4*  < > 34* 33.3* 31.0* 33.2*  MCV 93.9 99.7 100.0  < > 99.1  --   --  97.7  --  98.2  PLT 234.0 229 369  < > 208  < > 257 248  --  243  < > = values in this interval not displayed. Cardiac Enzymes:  Recent Labs  06/23/16 0705 07/26/16 0447 07/26/16 1059  TROPONINI 0.13* 0.40* 1.03*    BNP: Invalid input(s): POCBNP Lab Results  Component Value Date   HGBA1C 5.7 02/22/2015   Lab Results  Component Value Date   TSH 0.74 10/23/2015   Lab Results  Component Value Date   VITAMINB12 562 05/26/2011   Lab Results  Component Value Date   FOLATE >24.8 05/26/2011   Lab Results  Component Value Date   IRON 38 (L) 05/26/2011   FERRITIN 16.4 (L) 05/26/2011    Imaging and Procedures obtained prior to SNF admission: Dg Chest 2 View  Result Date: 07/28/2016 CLINICAL DATA:  Chest pain earlier tonight. EXAM: CHEST  2 VIEW COMPARISON:  07/26/2016 FINDINGS: Shallow inspiration with elevation of the right hemidiaphragm. Atelectasis in both lung bases similar to previous study. No focal airspace disease or consolidation. Normal heart size and pulmonary vascularity. Calcified aorta. Old right rib fractures. IMPRESSION: Shallow inspiration with elevation of right hemidiaphragm and atelectasis in the bases. Electronically Signed   By: Lucienne Capers M.D.   On: 07/28/2016 02:51    Assessment/Plan 1. NSTEMI (non-ST elevated myocardial infarction) ([place]) -seems this has really affected his cognition as well as his strength -he is very weak and dyspneic (denies both) -using walker to ambulate -cont secondary prevention strategies -pt denies having had this event altogether despite reviewing medical evidence with him  2. Acute on chronic combined systolic and diastolic heart failure (H[facility]) -seems acute episode resolved, cont current medications, elevate feet at rest, fluid and sodium restriction and monitor  3. Chronic respiratory failure with hypoxia ([facility]) -has been requiring oxygen especially on exertion due to hypoxia  4. Alcoholic cirrhosis of liver without ascites ([place]) -also noted, alcoholism may be a portion of why he wants to go home so desperately and may have caused his initial cognitive losses with overlapping delirium post MI  5. Dementia without behavioral  disturbance, unspecified dementia type -clearly has difficulty caring for himself, but does not accept this -adamant about going home--only safe by me if he goes with 24 hr supervision-family to decide on arrangements with care plan this afternoon  6. CKD (chronic kidney disease) stage 3, GFR 30-59 ml/min -ongoing, had acute renal failure also felt to be due to a cardiorenal cause which has improved with diuresis  7. Severe protein-calorie malnutrition ([place]) -intake and appetite remain poor, he is very frail at this time  and needs more support at home to encourage him to eat and drink and use his walker   Family/ staff Communication: spoke with his son, [name], on the phone  Labs/tests ordered:  F/u cbc, bmp

## 2016-07-28 NOTE — Discharge Instructions (Signed)
We have recommended that you stay in the hospital for further evaluation and to see cardiology. You have decided to leave [place]. If you have worsening pain, shortness of breath, fever, vomiting, feel like he may pass out or do pass out, please return to the hospital. Please follow-up with your primary care physician and cardiologist.

## 2016-07-28 NOTE — ED Provider Notes (Addendum)
By signing my name below, I, [name]. [name], attest that this documentation has been prepared under the direction and in the presence of [name], DO.  Electronically Signed: Maud [name]. [name], ED Scribe. 07/28/16. 2:59 AM.   TIME SEEN: 2:44 AM   CHIEF COMPLAINT:  Chief Complaint  Patient presents with  . Chest Pain    HPI: HPI Comments: [name] brought in by EMS is a 82 y.o. male with a PMHx of Alcoholic cirrhosis, CKD, dementia, aortic stenosis, HLD, HTN, and renal insufficiency who presents to the Emergency Department complaining of constant, unchanged upper L chest pain that radiates into the L shoulder onset this evening prior to arrival. Pain is described as achy, pressure-like. No aggravating or alleviating factors reported at this time. Patient care giver also reports cold chills and shortness of breath during this episode as well as complaints of pain that radiated down his left arm. Pt was started on 4L [place] prior to arrival. 324 ASA and 1 NTG administered en route to department with improvement. Pt was evaluated for same in the Emergency Department on 9/17. At that time pt was recommended for admission with elevated troponin, however, pt left AMA. Pt had trouble remembering leaving AMA or being evaluated for chest pain. He also had some difficulty remembering all details and symptoms from this evening.  He is currently CP free.  Has had bilateral lower swelling and redness. He is unable to tell me how long this is been going on. No pain in his legs.  Caregiver at bedside does report he had shaking chills tonight. His temperature was not measured at home.  PCP: [name], MD    ROS: See HPI; imited as patient is a very poor historian secondary to his dementia Constitutional: no fever  Eyes: no drainage  ENT: no runny nose   Cardiovascular:  Positive chest pain  Resp:  SOB  GI: no vomiting GU: no dysuria Integumentary: no rash  Allergy: no hives  Musculoskeletal:  no leg swelling  Neurological: no slurred speech ROS otherwise negative  PAST MEDICAL HISTORY/PAST SURGICAL HISTORY:  Past Medical History:  Diagnosis Date  . Acute combined systolic and diastolic CHF, NYHA class 1 ([place])   . Acute respiratory failure ([name])   . Alcoholic cirrhosis ([name])   . 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 Alcoholism ([name])   . Aortic stenosis   . Ascites 11/2004   "early" SBP on paracentesis.   . Chronic gastritis 05/01/2016  . CKD (chronic kidney disease), stage III 10/11/2015  . Colon polyp perhaps in 1990s   pathology/type not known  . Dementia   . Diverticulosis of colon perhaps in 1990s  . Duodenal ulcer 04/2016  . Fracture of wrist 2014 spring vs early summer   refused orthopods suggestion to set the bone.   Marland Kitchen GERD (gastroesophageal reflux disease)   . HLD (hyperlipidemia)   . HTN (hypertension)   . Hypertrophic cardiomyopathy ([name])   . Portal hypertensive gastropathy 2006  . Renal insufficiency   . Rhabdomyolysis 01/2005    MEDICATIONS:  Prior to Admission medications   Medication Sig Start Date End Date Taking? Authorizing Provider  acetaminophen (TYLENOL) 325 MG tablet Take 650 mg by mouth every 6 (six) hours as needed for pain.    Historical Provider, MD  ALPRAZolam Duanne Moron) 0.25 MG tablet Take 1 tablet (0.25 mg total) by mouth at bedtime as needed for anxiety. 07/02/16   Jonetta Osgood, MD  aspirin EC 81 MG  EC tablet Take 1 tablet (81 mg total) by mouth daily. 06/15/16   Robbie Lis, MD  clopidogrel (PLAVIX) 75 MG tablet TAKE ONE TABLET EACH DAY 07/27/16   Biagio Borg, MD  isosorbide mononitrate (IMDUR) 30 MG 24 hr tablet Take 30 mg by mouth daily.    Historical Provider, MD  metoprolol succinate (TOPROL-XL) 25 MG 24 hr tablet Take 2 tablets (50 mg total) by mouth daily. Take with or immediately following a meal. 07/17/16   [name], PA-C  metoprolol succinate (TOPROL-XL) 50 MG 24 hr tablet TAKE ONE TABLET EACH DAY 07/27/16   Biagio Borg, MD  mirtazapine (REMERON) 15 MG tablet Take 0.5 tablets (7.5 mg total) by mouth at bedtime. 07/24/16   Biagio Borg, MD  Multiple Vitamin (MULTIVITAMIN WITH MINERALS) TABS Take 1 tablet by mouth daily.    Historical Provider, MD  pantoprazole (PROTONIX) 40 MG tablet TAKE ONE TABLET TWICE DAILY 07/27/16   Biagio Borg, MD  potassium chloride 20 MEQ TBCR Take 20 mEq by mouth 2 (two) times daily. 07/02/16   [name], MD  potassium chloride SA (K-DUR,KLOR-CON) 20 MEQ tablet TAKE ONE TABLET EACH DAY 07/27/16   Biagio Borg, MD  simvastatin (ZOCOR) 20 MG tablet Take 20 mg by mouth daily.    Historical Provider, MD  torsemide (DEMADEX) 20 MG tablet TAKE ONE TABLET EACH DAY 07/27/16   Biagio Borg, MD    ALLERGIES:  No Known Allergies  SOCIAL HISTORY:  Social History  Substance Use Topics  . Smoking status: Never Smoker  . Smokeless tobacco: Never Used  . Alcohol use No     Comment: member of AA    FAMILY HISTORY: Family History  Problem Relation Age of Onset  . Cancer Father 61    lung cancer  . Heart disease Mother   . Heart disease Sister     EXAM: BP 138/71   Temp 97.4 F (36.3 C) (Oral)   Resp 25   SpO2 96%  CONSTITUTIONAL: Alert and oriented x 3 and responds appropriately to questions. Chronically ill-appearing, in no distress HEAD: Normocephalic EYES: Conjunctivae clear, PERRL ENT: normal nose; no rhinorrhea; moist mucous membranes NECK: Supple, no meningismus, no LAD, no JVD  CARD: RRR; S1 and S2 appreciated; no murmurs, no clicks, no rubs, no gallops RESP: Normal chest excursion without splinting or tachypnea; breath sounds clear and equal bilaterally; no wheezes, no rhonchi, no rales, no hypoxia or respiratory distress, speaking full sentences, diminished at bases bilaterally ABD/GI: Normal bowel sounds; non-distended; soft, non-tender, no rebound, no guarding, no peritoneal signs BACK:  The back appears normal and is non-tender to palpation, there is no CVA  tenderness EXT: Normal ROM in all joints; non-tender to palpation; 3+ pitting edema to the level of the knees bilaterally with associated circumferential erythema and warmth to his lower extremities, 2+ DP pulses bilaterally, no joint effusion, compartments are soft, no bony tenderness or bony deformity on exam,; normal capillary refill; no cyanosis, no calf tenderness or swelling    SKIN: Normal color for age and race; warm; no rash NEURO: Moves all extremities equally, sensation to light touch intact diffusely, cranial nerves II through XII intact PSYCH: The patient's mood and manner are appropriate. Grooming and personal hygiene are appropriate.  MEDICAL DECISION MAKING: Patient here with episode of chest pain shortness of breath. Given aspirin and nitroglycerin with EMS and symptoms have resolved. EKG shows no new ischemic changes. Was just admitted  to cardiology service on September 17 for elevated troponin. At that time it was documented that previously he had to climb cardiac catheterization. They are attempting to medically manage patient when he decided to leave [place]. It is difficult to get an accurate history from patient on why he left AGAINST MEDICAL ADVICE but I feel that his dementia plays a role in this. He denies any chest pain currently. First troponin is elevated and appears to be continuing to rise from previous. Received aspirin with EMS. We'll discuss with cardiology.  Patient does report now that if cardiology recommended a catheterization he would be willing to consider it. He agrees with admission to the hospital.   Patient also appears to have cellulitis in his bilateral lower extremity. Unclear how long this has been going on. Nursing staff at [facility] report chills tonight. He is afebrile currently and has no leukocytosis. Will obtain lactate, blood cultures. We'll give vancomycin.  ED PROGRESS: 3:30 AM  D/w Dr. Aundra Dubin with cardiology.  He feels patient  can be admitted to medicine. He feels patient needs diuresis. We'll give IV Lasix. He does not think given the slow rise in troponin that patient needs cardiac catheterization emergently. Feels cardiology can be consult in the morning. Patient states he will stay for admission. Patient's chest x-ray is clear. We'll discuss with hospitalist. PCP is Dr. [name].  4:00 AM  D/w Dr. Alcario Drought with hospitalist service who will see the patient. He will place orders as he feels appropriate. Patient has been updated with plan.   I reviewed all nursing notes, vitals, pertinent old records, EKGs, labs, imaging (as available).     EKG Interpretation  Date/Time:  Tuesday July 28 2016 01:54:44 EDT Ventricular Rate:  92 PR Interval:    QRS Duration: 146 QT Interval:  382 QTC Calculation: 473 R Axis:   -53 Text Interpretation:  Sinus rhythm Borderline prolonged PR interval Right bundle branch block Anterolateral infarct, age indeterminate No significant change since last tracing Confirmed by Madysen Faircloth,  DO, [place] ([id number]) on 07/28/2016 2:02:51 AM        I personally performed the services described in this documentation, which was scribed in my presence. The recorded information has been reviewed and is accurate.    [place], DO 07/28/16 0409   7:10 AM  Dr. Alcario Drought With hospitalist service has seen the patient and agrees on admission. Patient now refusing to stay and wants to leave again [place]. Myself, Dr. Alcario Drought have attempted to persuade patient to stay. He does have dementia but is oriented 3 and appears to have capacity to make decisions for himself. He is a retired Educational psychologist. Have recommended close follow-up with his outpatient providers. Discussed with him he should return if symptoms return or worsen at all.  He is able to verbalize risks back to me. His caregiver at bedside is also tried to convince him to stay. At this time I do not think we can hold him  against his will and keep him in the hospital.    [place], DO 07/28/16 [phone]

## 2016-07-28 NOTE — ED Notes (Signed)
Pt caregiver informed this RN that the pt needed to go to the restroom. Pt being rude top this RN. This RN attempted to assist the pt and the pt became

## 2016-07-29 LAB — BASIC METABOLIC PANEL
BUN: 50 mg/dL — AB (ref 4–21)
Creatinine: 2.1 mg/dL — AB (ref 0.6–1.3)
Glucose: 132 mg/dL
Potassium: 5.1 mmol/L (ref 3.4–5.3)
Sodium: 142 mmol/L (ref 137–147)

## 2016-07-31 ENCOUNTER — Non-Acute Institutional Stay (SKILLED_NURSING_FACILITY): Payer: Medicare Other | Admitting: Adult Health

## 2016-07-31 ENCOUNTER — Encounter: Payer: Self-pay | Admitting: Adult Health

## 2016-07-31 DIAGNOSIS — I872 Venous insufficiency (chronic) (peripheral): Secondary | ICD-10-CM

## 2016-07-31 DIAGNOSIS — I8312 Varicose veins of left lower extremity with inflammation: Secondary | ICD-10-CM | POA: Diagnosis not present

## 2016-07-31 DIAGNOSIS — N183 Chronic kidney disease, stage 3 unspecified: Secondary | ICD-10-CM

## 2016-07-31 DIAGNOSIS — I248 Other forms of acute ischemic heart disease: Secondary | ICD-10-CM

## 2016-07-31 DIAGNOSIS — D500 Iron deficiency anemia secondary to blood loss (chronic): Secondary | ICD-10-CM | POA: Diagnosis not present

## 2016-07-31 DIAGNOSIS — I5042 Chronic combined systolic (congestive) and diastolic (congestive) heart failure: Secondary | ICD-10-CM | POA: Diagnosis not present

## 2016-07-31 DIAGNOSIS — K269 Duodenal ulcer, unspecified as acute or chronic, without hemorrhage or perforation: Secondary | ICD-10-CM

## 2016-07-31 DIAGNOSIS — E785 Hyperlipidemia, unspecified: Secondary | ICD-10-CM | POA: Diagnosis not present

## 2016-07-31 DIAGNOSIS — I8311 Varicose veins of right lower extremity with inflammation: Secondary | ICD-10-CM | POA: Diagnosis not present

## 2016-07-31 DIAGNOSIS — J9611 Chronic respiratory failure with hypoxia: Secondary | ICD-10-CM | POA: Diagnosis not present

## 2016-07-31 DIAGNOSIS — R413 Other amnesia: Secondary | ICD-10-CM | POA: Diagnosis not present

## 2016-07-31 DIAGNOSIS — J9610 Chronic respiratory failure, unspecified whether with hypoxia or hypercapnia: Secondary | ICD-10-CM | POA: Insufficient documentation

## 2016-07-31 DIAGNOSIS — I2489 Other forms of acute ischemic heart disease: Secondary | ICD-10-CM

## 2016-07-31 NOTE — Progress Notes (Addendum)
Patient ID: [name], male   DOB: 1929/07/18, 82 y.o.   MRN: [id number] .  Location:  Occupational psychologist of Service:  SNF (31)  Provider:  Cindi Carbon, [place] [phone]   PCP: [name], MD Patient Care Team: Biagio Borg, MD as PCP - General (Internal Medicine) Hillary Bow, MD (Cardiology) Sable Feil, MD (Gastroenterology) Luberta Mutter, MD (Ophthalmology)  Extended Emergency Contact Information Primary Emergency Contact: [place] Phone: [phone] Relation: Son Secondary Emergency Contact: [place] of [place] Mobile Phone: [phone] Relation: Son  Code Status: Full code Goals of care:  Advanced Directive information Advanced Directives 07/28/2016  Does patient have an advance directive? No  Type of Advance Directive -  Does patient want to make changes to advanced directive? -  Copy of advanced directive(s) in chart? -  Would patient like information on creating an advanced directive? No - patient declined information  Pre-existing out of facility DNR order (yellow form or pink MOST form) -  Some encounter information is confidential and restricted. Go to Review Flowsheets activity to see all data.     No Known Allergies  Chief Complaint  Patient presents with  . Discharge Note    HPI:  82 y.o. male  Admitted to rehab at [facility] after two ER visit for chest pain on 9/17 and 9/19.  On 9/17 he was diagnosed with a NSTEMI with initial trop of 0.4, then increase to 1.03 but then he refused hospitalization or cardiac cath and left AMA.  He was seen again on 9/19 in the ER with chest pain which resolved and he left AMA again.  He later returned to rehab. He denies any chest pain now or sob and states that he may have overstated his symptoms to get attention. He had a psych eval done during his hospitalization in August and he was found  competent to make his own decisions, despite his hx of dementia. He is a retired Industrial/product designer.   It is noted that during his stay in rehab he has low 02 sats with ambulation. 86% today on RA that corrected to 95% with 2L of oxygen.    CXR 07/28/16 IMPRESSION: Shallow inspiration with elevation of right hemidiaphragm and atelectasis in the bases.  He has a hx of CHF, weight down 1 lb from the day prior at 149 lbs. Has chronic erythema and lower ext edema.  Wears ted hose and takes 20 mg of demedex per day.   No fever, chills, etc.  Eating well, having normal BMs.  Hx of combined CHF, EF 45-50% on 06/15/16.    He was started on keflex in the ER on 9/19 for a dx of BLE cellulitis.  He is not febrile, has no drainage, but does have chronic erythema to his legs.    Past Medical History:  Diagnosis Date  . Acute combined systolic and diastolic CHF, NYHA class 1 ([place])   . Acute respiratory failure ([place])   . Alcoholic cirrhosis ([place])   . 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 Alcoholism ([place])   . Aortic stenosis   . Ascites 11/2004   "early" SBP on paracentesis.   . Chronic gastritis 05/01/2016  . CKD (chronic kidney disease), stage III 10/11/2015  . Colon polyp perhaps in 1990s   pathology/type not known  . Dementia   . Diverticulosis of colon perhaps in 1990s  . Duodenal ulcer 04/2016  .  Fracture of wrist 2014 spring vs early summer   refused orthopods suggestion to set the bone.   Marland Kitchen GERD (gastroesophageal reflux disease)   . HLD (hyperlipidemia)   . HTN (hypertension)   . Hypertrophic cardiomyopathy ([place])   . Portal hypertensive gastropathy 2006  . Renal insufficiency   . Rhabdomyolysis 01/2005    Past Surgical History:  Procedure Laterality Date  . BASAL CELL CARCINOMA EXCISION Left 11/2012   lower eyelid  . ESOPHAGOGASTRODUODENOSCOPY N/A 08/04/2013   Procedure: ESOPHAGOGASTRODUODENOSCOPY (EGD);  Surgeon: Ladene Artist, MD;  Location: [facility] ENDOSCOPY;  Service: Endoscopy;   Laterality: N/A;  . ESOPHAGOGASTRODUODENOSCOPY (EGD) WITH PROPOFOL N/A 05/01/2016   Procedure: ESOPHAGOGASTRODUODENOSCOPY (EGD) WITH PROPOFOL;  Surgeon: Jerene Bears, MD;  Location: [facility] ENDOSCOPY;  Service: Endoscopy;  Laterality: N/A;  . INGUINAL HERNIA REPAIR    . MELANOMA EXCISION Left 02/2011   malignant melanoma of thigh      reports that he has never smoked. He has never used smokeless tobacco. He reports that he does not drink alcohol or use drugs. Social History   Social History  . Marital status: Married    Spouse name: N/A  . Number of children: 4  . Years of education: 24   Occupational History  . pathologist Retired    retired   Social History Main Topics  . Smoking status: Never Smoker  . Smokeless tobacco: Never Used  . Alcohol use No     Comment: member of AA  . Drug use: No  . Sexual activity: Not on file   Other Topics Concern  . 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  Remains independent. ACP- not discussed, will approach at next OV   Functional Status Survey:    No Known Allergies  Pertinent  Health Maintenance Due  Topic Date Due  . PNA vac Low Risk Adult (2 of 2 - PPSV23) 06/15/2015  . INFLUENZA VACCINE  06/09/2016    Medications:   Medication List       Accurate as of 07/31/16 10:57 AM. Always use your most recent med list.          acetaminophen 325 MG tablet Commonly known as:  TYLENOL Take 650 mg by mouth every 6 (six) hours as needed for pain.   ALPRAZolam 0.25 MG tablet Commonly known as:  XANAX Take 1 tablet (0.25 mg total) by mouth at bedtime as needed for anxiety.   aspirin 81 MG EC tablet Take 1 tablet (81 mg total) by mouth daily.   clopidogrel 75 MG tablet Commonly known as:  PLAVIX TAKE ONE TABLET EACH DAY   isosorbide mononitrate 30 MG 24 hr tablet Commonly known as:  IMDUR Take 30 mg by mouth daily.   metoprolol succinate 50 MG 24 hr tablet Commonly known  as:  TOPROL-XL TAKE ONE TABLET EACH DAY   mirtazapine 15 MG tablet Commonly known as:  REMERON Take 0.5 tablets (7.5 mg total) by mouth at bedtime.   multivitamin with minerals Tabs tablet Take 1 tablet by mouth daily.   pantoprazole 40 MG tablet Commonly known as:  PROTONIX TAKE ONE TABLET TWICE DAILY   potassium chloride SA 20 MEQ tablet Commonly known as:  K-DUR,KLOR-CON TAKE ONE TABLET EACH DAY   simvastatin 20 MG tablet Commonly known as:  ZOCOR Take 20 mg by mouth daily.   sulfamethoxazole-trimethoprim 800-160 MG tablet Commonly known as:  BACTRIM DS,SEPTRA DS Take 1 tablet by  mouth 2 (two) times daily.   torsemide 20 MG tablet Commonly known as:  DEMADEX TAKE ONE TABLET EACH DAY       Review of Systems  Constitutional: Negative for activity change, appetite change, chills, diaphoresis, fatigue, fever and unexpected weight change.  Respiratory: Negative for cough, shortness of breath, wheezing and stridor.   Cardiovascular: Positive for leg swelling. Negative for chest pain and palpitations.  Gastrointestinal: Negative for abdominal distention, abdominal pain, constipation and diarrhea.  Genitourinary: Negative for difficulty urinating and dysuria.  Musculoskeletal: Positive for gait problem. Negative for arthralgias, back pain, joint swelling and myalgias.  Neurological: Negative for dizziness, seizures, syncope, facial asymmetry, speech difficulty, weakness and headaches.  Hematological: Negative for adenopathy. Does not bruise/bleed easily.  Psychiatric/Behavioral: Positive for confusion. Negative for agitation and behavioral problems.    Vitals:   07/31/16 1054  BP: 137/72  Pulse: 84  Resp: 20  Temp: 97.9 F (36.6 C)  SpO2: 99%  Weight: 149 lb 6.4 oz (67.8 kg)   Body mass index is 22.72 kg/m. Physical Exam  Constitutional: No distress.  HENT:  Head: Normocephalic and atraumatic.  Neck: Normal range of motion. Neck supple. No JVD present. No  tracheal deviation present. No thyromegaly present.  Cardiovascular: Normal rate and regular rhythm.   No murmur heard. BLE edema +2, chronic erythema  Pulmonary/Chest: Effort normal. No respiratory distress. He has no wheezes. He has rales.  Abdominal: Soft. Bowel sounds are normal. He exhibits no distension. There is no tenderness.  Lymphadenopathy:    He has no cervical adenopathy.  Neurological: He is alert. No cranial nerve deficit.  Skin: Skin is warm and dry. He is not diaphoretic.  Psychiatric: He has a normal mood and affect.  Nursing note and vitals reviewed.   Labs reviewed: Basic Metabolic Panel:  Recent Labs  06/30/16 0508  07/02/16 0542  07/15/16 1106 07/26/16 0453 07/28/16 0010 07/28/16 0311 07/29/16  NA 138  < > 138  < > 137 143 143 143 142  K 3.6  < > 3.7  < > 4.6 5.1 6.3* 5.4* 5.1  CL 98*  < > 97*  --  104 111 116* 115*  --   CO2 30  < > 32  --  24  --  22 19*  --   GLUCOSE 137*  < > 107*  --  147* 139* 140* 132*  --   BUN 76*  < > 80*  < > 87* 48* 51* 50* 50*  CREATININE 2.67*  < > 2.36*  < > 2.17* 2.10* 2.33* 2.18* 2.1*  CALCIUM 8.5*  < > 8.8*  --  9.0  --  8.8* 8.6*  --   MG 1.6*  --  1.9  --  1.9  --   --   --   --   < > = values in this interval not displayed. Liver Function Tests:  Recent Labs  04/13/16 1046 04/30/16 0422 06/12/16 0556  AST 21 19 24   ALT 17 15* 15*  ALKPHOS 75 63 85  BILITOT 0.7 0.6 0.6  PROT 6.7 5.5* 6.0*  ALBUMIN 3.5 2.7* 3.0*   No results for input(s): LIPASE, AMYLASE in the last 8760 hours. No results for input(s): AMMONIA in the last 8760 hours. CBC:  Recent Labs  05/28/16 1556 06/12/16 0556 06/23/16 0705  06/29/16 0554  07/10/16 07/26/16 0447 07/26/16 0453 07/28/16 0010  WBC 9.7 8.3 16.2*  < > 9.8  < > 9.5 8.2  --  8.4  NEUTROABS 7.5 5.8 12.1*  --   --   --   --   --   --   --   HGB 11.6* 10.3* 11.1*  < > 9.7*  < > 10.4* 10.3* 10.5* 10.3*  HCT 35.3* 33.3* 35.2*  < > 31.4*  < > 34* 33.3* 31.0* 33.2*  MCV  93.9 99.7 100.0  < > 99.1  --   --  97.7  --  98.2  PLT 234.0 229 369  < > 208  < > 257 248  --  243  < > = values in this interval not displayed. Cardiac Enzymes:  Recent Labs  06/23/16 0705 07/26/16 0447 07/26/16 1059  TROPONINI 0.13* 0.40* 1.03*   BNP: Invalid input(s): POCBNP CBG: No results for input(s): GLUCAP in the last 8760 hours.  Procedures and Imaging Studies During Stay: Dg Chest 2 View  Result Date: 07/28/2016 CLINICAL DATA:  Chest pain earlier tonight. EXAM: CHEST  2 VIEW COMPARISON:  07/26/2016 FINDINGS: Shallow inspiration with elevation of the right hemidiaphragm. Atelectasis in both lung bases similar to previous study. No focal airspace disease or consolidation. Normal heart size and pulmonary vascularity. Calcified aorta. Old right rib fractures. IMPRESSION: Shallow inspiration with elevation of right hemidiaphragm and atelectasis in the bases. Electronically Signed   By: Lucienne Capers M.D.   On: 07/28/2016 02:51   Dg Chest Portable 1 View  Result Date: 07/26/2016 CLINICAL DATA:  Shortness of breath tonight. EXAM: PORTABLE CHEST 1 VIEW COMPARISON:  Radiograph 06/27/2016 FINDINGS: Chronic elevation of right hemidiaphragm. Improved bibasilar aeration with resolve it right lower lung zone opacity. Minimal persistent atelectasis at the left lung base. Cardiomediastinal contours are unchanged. No new focal airspace opacity. No pneumothorax. Unchanged osseous structures. IMPRESSION: Improved lung aeration from prior exam. Minimal residual left basilar atelectasis. Chronic elevation of the right hemidiaphragm. Electronically Signed   By: Jeb Levering M.D.   On: 07/26/2016 04:30    Assessment/Plan:    1. Chronic respiratory failure with hypoxia (HCC) Validated need for oxygen at 2L [place].  Will be discharged with a concentrator and home care to arrange for best fit test for oxygen method.    2. Demand ischemia of myocardium ([place]) Resolved Would recommend that our  staff ([facility] independent living RN) give nitro and xanax 0.25 mg when he calls first and if his symptoms do not resolve then we can send him to the ER. We would like to avoid hospitalizations given that he leaves AMA once he arrives there. Keeping oxygen on continuously may help as well.   3. Duodenal ulcer disease Continue protonix 40 mg BID and monitor CBC No aggressive work up due to age/debility.  Remains on plavix and aspirin ( see Dr. Silvio Pate note)  4. Iron deficiency anemia due to chronic blood loss Stable Not currently on ferrous sulfate H/H stable No blood note in stool  5. Memory loss Seems to lack insight into his care or situation.  Psych has deemed him appropriate for decision making but this will continually need to be reassessed. MMSE 26/30 on 07/28/16  6. CKD (chronic kidney disease) stage 3, GFR 30-59 ml/min Improved over all Continue to monitor BMP Avoid nsaids  7. Chronic combined systolic (congestive) and diastolic (congestive) heart failure (HCC) Continue Demedex 20 mg qd  FR 1500 CC Weights daily  8. Hyperlipidemia Simvastatin 20 mg qd  9. Chronic venous stasis Compression hose on in the am off in the pm Elevation Diuretics Legs are always erythematous Continue  Keflex to complete 7 days course, completed on 9/26  Patient is being discharged with the following home health services:   Continue PT and med management at home with home care  Patient is being discharged with the following durable medical equipment:   Home 02 with a concentrator  Follow up apt with Cards on 9/25 and PCP Dr. [name] 10/3 .  Prescription provided for xanax 0.25 mg q 8 prn anxiety   Future labs/tests needed:  CBC BMP

## 2016-07-31 NOTE — Addendum Note (Signed)
Addended by: Barnie Mort on: 07/31/2016 11:44 AM   Modules accepted: Orders

## 2016-08-02 LAB — CULTURE, BLOOD (ROUTINE X 2)
CULTURE: NO GROWTH
Culture: NO GROWTH

## 2016-08-02 NOTE — Discharge Summary (Signed)
Patient left AMA, no charge

## 2016-08-03 ENCOUNTER — Encounter: Payer: Self-pay | Admitting: Cardiovascular Disease

## 2016-08-03 ENCOUNTER — Ambulatory Visit (INDEPENDENT_AMBULATORY_CARE_PROVIDER_SITE_OTHER): Payer: Medicare Other | Admitting: Cardiovascular Disease

## 2016-08-03 VITALS — BP 112/62 | HR 84 | Ht 68.0 in

## 2016-08-03 DIAGNOSIS — I10 Essential (primary) hypertension: Secondary | ICD-10-CM | POA: Diagnosis not present

## 2016-08-03 DIAGNOSIS — I5042 Chronic combined systolic (congestive) and diastolic (congestive) heart failure: Secondary | ICD-10-CM | POA: Diagnosis not present

## 2016-08-03 DIAGNOSIS — I421 Obstructive hypertrophic cardiomyopathy: Secondary | ICD-10-CM | POA: Diagnosis not present

## 2016-08-03 DIAGNOSIS — E785 Hyperlipidemia, unspecified: Secondary | ICD-10-CM

## 2016-08-03 NOTE — Progress Notes (Signed)
Chief Complaint  Patient presents with  . Follow-up    History of Present Illness: Dr. Daffin is a 82 yo male with history of hypertrophic cardiomyopathy, HTN, HLD, alcohol abuse here today for cardiac follow up. He has been followed in the past by Dr. Lia Foyer. He is a retired Industrial/product designer. Admission to [facility] with GI bleeding secondary to duodenal ulcer, gastritis 04/29/16 and required blood transfusion. Troponin was elevated but felt to be due to demand ischemia. Admitted August 2017 with chest pain, mild troponin elevation. Nuclear stress test with scar, no ischemia. Pt refused cath. Echo August 2017 with mild LV systolic dysfunction, [id number], mild AI, mild AS.  Admitted to [facility] 06/25/16 with CHF and was followed by the CHF team. He was diuresed with Lasix and milrinone was used. He lost 24 lbs during the admission and was discharged on Torsemide. He has been seen in follow in the CHF clinic. He was seen in the ED twice last week with chest pain but demanded to go home each time.   He is here today for cardiac follow up. He has had no chest pain or dizziness. He has continued LE edema but he does not think this has changed. He has been taking Torsemide 20 mg daily. He is wearing supplemental O2. Denies dyspnea.    Primary Care Physician: [name], MD  Past Medical History:  Diagnosis Date  . Acute combined systolic and diastolic CHF, NYHA class 1 ([place])   . Acute respiratory failure ([place])   . Alcoholic cirrhosis ([place])   . 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 Alcoholism ([place])   . Aortic stenosis   . Ascites 11/2004   "early" SBP on paracentesis.   . Chronic gastritis 05/01/2016  . CKD (chronic kidney disease), stage III 10/11/2015  . Colon polyp perhaps in 1990s   pathology/type not known  . Dementia   . Diverticulosis of colon perhaps in 1990s  . Duodenal ulcer 04/2016  . Fracture of wrist 2014 spring vs early summer   refused orthopods suggestion to set the bone.   Marland Kitchen GERD  (gastroesophageal reflux disease)   . HLD (hyperlipidemia)   . HTN (hypertension)   . Hypertrophic cardiomyopathy ([place])   . Portal hypertensive gastropathy 2006  . Renal insufficiency   . Rhabdomyolysis 01/2005    Past Surgical History:  Procedure Laterality Date  . BASAL CELL CARCINOMA EXCISION Left 11/2012   lower eyelid  . ESOPHAGOGASTRODUODENOSCOPY N/A 08/04/2013   Procedure: ESOPHAGOGASTRODUODENOSCOPY (EGD);  Surgeon: Ladene Artist, MD;  [facility] END[facility]COPY;  Service: Endoscopy;  Laterality: N/A;  . ESOPHAGOGASTRODUODENOSCOPY (EGD) WITH PROPOFOL N/A 05/01/2016   Procedure: ESOPHAGOGASTRODUODENOSCOPY (EGD) WITH PROPOFOL;  Surgeon: Jerene Bears, MD;  Location: [facility] ENDOSCOPY;  Service: Endoscopy;  Laterality: N/A;  . INGUINAL HERNIA REPAIR    . MELANOMA EXCISION Left 02/2011   malignant melanoma of thigh    Current Outpatient Prescriptions  Medication Sig Dispense Refill  . acetaminophen (TYLENOL) 325 MG tablet Take 650 mg by mouth every 6 (six) hours as needed for pain.    Marland Kitchen ALPRAZolam (XANAX) 0.25 MG tablet Take 1 tablet (0.25 mg total) by mouth at bedtime as needed for anxiety. 10 tablet 0  . aspirin EC 81 MG EC tablet Take 1 tablet (81 mg total) by mouth daily. 30 tablet 0  . cephALEXin (KEFLEX) 500 MG capsule Take 500 mg by mouth 3 (three) times daily.    . clopidogrel (PLAVIX) 75 MG tablet TAKE ONE TABLET EACH DAY 30  tablet 3  . isosorbide mononitrate (IMDUR) 30 MG 24 hr tablet Take 30 mg by mouth daily.    . metoprolol succinate (TOPROL-XL) 50 MG 24 hr tablet TAKE ONE TABLET EACH DAY 30 tablet 3  . mirtazapine (REMERON) 15 MG tablet Take 0.5 tablets (7.5 mg total) by mouth at bedtime. 30 tablet 3  . Multiple Vitamin (MULTIVITAMIN WITH MINERALS) TABS Take 1 tablet by mouth daily.    . pantoprazole (PROTONIX) 40 MG tablet TAKE ONE TABLET TWICE DAILY 60 tablet 2  . potassium chloride SA (K-DUR,KLOR-CON) 20 MEQ tablet TAKE ONE TABLET EACH DAY 30 tablet 3  . saccharomyces  boulardii (FLORASTOR) 250 MG capsule Take 250 mg by mouth.    . simvastatin (ZOCOR) 20 MG tablet Take 20 mg by mouth daily.    Marland Kitchen torsemide (DEMADEX) 20 MG tablet TAKE ONE TABLET EACH DAY 30 tablet 3   No current facility-administered medications for this visit.     No Known Allergies  Social History   Social History  . Marital status: Married    Spouse name: N/A  . Number of children: 4  . Years of education: 24   Occupational History  . pathologist Retired    retired   Social History Main Topics  . Smoking status: Never Smoker  . Smokeless tobacco: Never Used  . Alcohol use No     Comment: member of AA  . Drug use: No  . Sexual activity: Not on file   Other Topics Concern  . 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  Remains independent. ACP- not discussed, will approach at next OV    Family History  Problem Relation Age of Onset  . Cancer Father 7    lung cancer  . Heart disease Mother   . Heart disease Sister     Review of Systems:  As stated in the HPI and otherwise negative.   BP 112/62 (BP Location: Left Arm, Patient Position: Sitting, Cuff Size: Normal)   Pulse 84   Ht 5\' 8"  (1.727 m)   Physical Examination: General: Well developed, well nourished, NAD  HEENT: OP clear, mucus membranes moist  SKIN: warm, dry. No rashes. Neuro: No focal deficits  Musculoskeletal: Muscle strength 5/5 all ext  Psychiatric: Mood and affect normal  Neck: No JVD, no carotid bruits, no thyromegaly, no lymphadenopathy.  Lungs: Basilar crackles bilaterally. No wheezes, rhonci, crackles Cardiovascular: Regular rate and rhythm. No murmurs, gallops or rubs. Abdomen:Soft. Bowel sounds present. Non-tender.  Extremities: 2+ bilateral lower extremity edema.   Echo 05/01/16: Left ventricle: The cavity size was normal. There was moderate   concentric hypertrophy. Systolic function was normal. The   estimated ejection fraction  was in the range of 60% to 65%. Wall   motion was normal; there were no regional wall motion   abnormalities. Doppler parameters are consistent with abnormal   left ventricular relaxation (grade 1 diastolic dysfunction).   Doppler parameters are consistent with elevated ventricular   end-diastolic filling pressure. - Aortic valve: There was mild stenosis. There was moderate   regurgitation. Mean gradient (S): 14 mm Hg. Peak gradient (S): 31   mm Hg. Valve area (VTI): 2.78 cm^2. Valve area (Vmax): 2.69 cm^2.   Valve area (Vmean): 2.49 cm^2. - Aortic root: The aortic root was normal in size. - Mitral valve: Structurally normal valve. There was no   regurgitation. - Left atrium: The atrium was moderately dilated. -  Right ventricle: The cavity size was normal. Wall thickness was   normal. Systolic function was normal. - Right atrium: The atrium was normal in size. - Tricuspid valve: There was moderate regurgitation. - Pulmonic valve: There was mild regurgitation. - Pulmonary arteries: Systolic pressure was mildly increased. PA   peak pressure: 43 mm Hg (S). - Inferior vena cava: The vessel was normal in size. - Pericardium, extracardiac: There was no pericardial effusion.   EKG:  EKG is not ordered today. The ekg ordered today demonstrates   Recent Labs: 10/23/2015: TSH 0.74 06/12/2016: ALT 15 07/15/2016: Magnesium 1.9 07/28/2016: B Natriuretic Peptide 780.5; Hemoglobin 10.3; Platelets 243 07/29/2016: BUN 50; Creatinine 2.1; Potassium 5.1; Sodium 142   Lipid Panel    Component Value Date/Time   CHOL 93 04/30/2016 0422   TRIG 129 04/30/2016 0422   HDL 32 (L) 04/30/2016 0422   CHOLHDL 2.9 04/30/2016 0422   VLDL 26 04/30/2016 0422   LDLCALC 35 04/30/2016 0422     Wt Readings from Last 3 Encounters:  07/31/16 67.8 kg (149 lb 6.4 oz)  07/28/16 67.1 kg (148 lb)  07/26/16 68.5 kg (151 lb)     Other studies Reviewed: Additional studies/ records that were reviewed today include:  . Review of the above records demonstrates:    Assessment and Plan:   1. Hypertrophic cardiomyopathy: No symptoms. Echo 2017 with no physiology of HOCM  2. HTN: BP controlled. No changes.   3. Combined Chronic systolic and diastolic CHF: Still with 2+ edema. He did not want to be weighed today. Weight 3 days ago in rehab was 149 lbs  He has basilar crackles which I think may be chronic (noted on my last exam too but he was volume overloaded at that time). He was 151 lbs when seen in CHF clinic 07/15/16. I will have him increase his Torsemide to 20 mg po BID for 3 days then back to once daily. He is schedule to be seen in the CHF clinic in 3 weeks. Elevate legs during day. Compression stockings as tolerated.   4. Hyperlipidemia: He is on a statin. LDL is controlled.    5. CAD: Pt has refused cath. Likely has CAD and prior anterior infarction. We discussed this today but he is once again not interested in pursuing an ischemic evaluation.   Current medicines are reviewed at length with the patient today.  The patient does not have concerns regarding medicines.  The following changes have been made:  no change  Labs/ tests ordered today include:  No orders of the defined types were placed in this encounter.   Disposition:   FU in the CHF clinic as planned in 3 weeks.   Signed, Lauree [name], MD 08/03/2016 1:36 PM    [facility] [address], [place], [place]  [address] Phone: [phone]; Fax: [phone]

## 2016-08-03 NOTE — Patient Instructions (Signed)
Medication Instructions:  Your physician has recommended you make the following change in your medication: Increase torsemide to 20 mg by mouth twice daily for 3 days and then go back to 20 mg daily.     Labwork: none  Testing/Procedures: none  Follow-Up: Keep scheduled follow up with CHF clinic on October 18,2017  Any Other Special Instructions Will Be Listed Below (If Applicable).     If you need a refill on your cardiac medications before your next appointment, please call your pharmacy.

## 2016-08-11 ENCOUNTER — Ambulatory Visit (INDEPENDENT_AMBULATORY_CARE_PROVIDER_SITE_OTHER): Payer: Medicare Other | Admitting: Internal Medicine

## 2016-08-11 ENCOUNTER — Encounter: Payer: Self-pay | Admitting: Internal Medicine

## 2016-08-11 VITALS — BP 124/74 | HR 77 | Temp 98.0°F | Resp 20 | Wt 151.0 lb

## 2016-08-11 DIAGNOSIS — I10 Essential (primary) hypertension: Secondary | ICD-10-CM

## 2016-08-11 DIAGNOSIS — E785 Hyperlipidemia, unspecified: Secondary | ICD-10-CM

## 2016-08-11 DIAGNOSIS — N183 Chronic kidney disease, stage 3 unspecified: Secondary | ICD-10-CM

## 2016-08-11 DIAGNOSIS — Z23 Encounter for immunization: Secondary | ICD-10-CM | POA: Diagnosis not present

## 2016-08-11 NOTE — Patient Instructions (Addendum)
You had the flu shot today  Please continue all other medications as before, and refills have been done if requested.  Please have the pharmacy call with any other refills you may need.  Please continue your efforts at being more active, low cholesterol diet, and weight control.  Please keep your appointments with your specialists as you may have planned  Please return in 4 months, or sooner if needed

## 2016-08-11 NOTE — Progress Notes (Signed)
Pre visit review using our clinic review tool, if applicable. No additional management support is needed unless otherwise documented below in the visit note. 

## 2016-08-11 NOTE — Progress Notes (Signed)
Subjective:    Patient ID: [name], male    DOB: 05/12/1929, 82 y.o.   MRN: [id number]  HPI    Here with son, Here to f/u; overall doing ok,  Pt denies chest pain, increasing sob or doe, wheezing, orthopnea, PND, increased LE swelling, palpitations, dizziness or syncope.  Pt denies new neurological symptoms such as new headache, or facial or extremity weakness or numbness.  Pt denies polydipsia, polyuria, or low sugar episode.   Pt denies new neurological symptoms such as new headache, or facial or extremity weakness or numbness.   Pt states overall good compliance with meds, Has new home hospital bed and oxygen concentrator. Does have some desat with exercise with pt, NOW ON 2L chronic 24/7.  Has appt with [facility] in 1 wk.  Due for flu shot today.  No new complaints Wt Readings from Last 3 Encounters:  08/11/16 151 lb (68.5 kg)  07/31/16 149 lb 6.4 oz (67.8 kg)  07/28/16 148 lb (67.1 kg)   Past Medical History:  Diagnosis Date  . Acute combined systolic and diastolic CHF, NYHA class 1 ([place])   . Acute respiratory failure ([place])   . Alcoholic cirrhosis ([place])   . 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 Alcoholism ([place])   . Aortic stenosis   . Ascites 11/2004   "early" SBP on paracentesis.   . Chronic gastritis 05/01/2016  . CKD (chronic kidney disease), stage III 10/11/2015  . Colon polyp perhaps in 1990s   pathology/type not known  . Dementia   . Diverticulosis of colon perhaps in 1990s  . Duodenal ulcer 04/2016  . Fracture of wrist 2014 spring vs early summer   refused orthopods suggestion to set the bone.   Marland Kitchen GERD (gastroesophageal reflux disease)   . HLD (hyperlipidemia)   . HTN (hypertension)   . Hypertrophic cardiomyopathy ([place])   . Portal hypertensive gastropathy 2006  . Renal insufficiency   . Rhabdomyolysis 01/2005   Past Surgical History:  Procedure Laterality Date  . BASAL CELL CARCINOMA EXCISION Left 11/2012   lower eyelid  .  ESOPHAGOGASTRODUODENOSCOPY N/A 08/04/2013   Procedure: ESOPHAGOGASTRODUODENOSCOPY (EGD);  Surgeon: Ladene Artist, MD;  Location: [facility] ENDOSCOPY;  Service: Endoscopy;  Laterality: N/A;  . ESOPHAGOGASTRODUODENOSCOPY (EGD) WITH PROPOFOL N/A 05/01/2016   Procedure: ESOPHAGOGASTRODUODENOSCOPY (EGD) WITH PROPOFOL;  Surgeon: Jerene Bears, MD;  Location: [facility] ENDOSCOPY;  Service: Endoscopy;  Laterality: N/A;  . INGUINAL HERNIA REPAIR    . MELANOMA EXCISION Left 02/2011   malignant melanoma of thigh    reports that he has never smoked. He has never used smokeless tobacco. He reports that he does not drink alcohol or use drugs. family history includes Cancer (age of onset: 46) in his father; Heart disease in his mother and sister. No Known Allergies Current Outpatient Prescriptions on File Prior to Visit  Medication Sig Dispense Refill  . acetaminophen (TYLENOL) 325 MG tablet Take 650 mg by mouth every 6 (six) hours as needed for pain.    Marland Kitchen ALPRAZolam (XANAX) 0.25 MG tablet Take 1 tablet (0.25 mg total) by mouth at bedtime as needed for anxiety. 10 tablet 0  . aspirin EC 81 MG EC tablet Take 1 tablet (81 mg total) by mouth daily. 30 tablet 0  . cephALEXin (KEFLEX) 500 MG capsule Take 500 mg by mouth 3 (three) times daily.    . clopidogrel (PLAVIX) 75 MG tablet TAKE ONE TABLET EACH DAY 30 tablet 3  . isosorbide mononitrate (IMDUR) 30 MG  24 hr tablet Take 30 mg by mouth daily.    . metoprolol succinate (TOPROL-XL) 50 MG 24 hr tablet TAKE ONE TABLET EACH DAY 30 tablet 3  . mirtazapine (REMERON) 15 MG tablet Take 0.5 tablets (7.5 mg total) by mouth at bedtime. 30 tablet 3  . Multiple Vitamin (MULTIVITAMIN WITH MINERALS) TABS Take 1 tablet by mouth daily.    . pantoprazole (PROTONIX) 40 MG tablet TAKE ONE TABLET TWICE DAILY 60 tablet 2  . potassium chloride SA (K-DUR,KLOR-CON) 20 MEQ tablet TAKE ONE TABLET EACH DAY 30 tablet 3  . saccharomyces boulardii (FLORASTOR) 250 MG capsule Take 250 mg by mouth.    .  simvastatin (ZOCOR) 20 MG tablet Take 20 mg by mouth daily.    Marland Kitchen torsemide (DEMADEX) 20 MG tablet TAKE ONE TABLET EACH DAY 30 tablet 3   No current facility-administered medications on file prior to visit.     Review of Systems  Constitutional: Negative for unusual diaphoresis or night sweats HENT: Negative for ear swelling or discharge Eyes: Negative for worsening visual haziness  Respiratory: Negative for choking and stridor.   Gastrointestinal: Negative for distension or worsening eructation Genitourinary: Negative for retention or change in urine volume.  Musculoskeletal: Negative for other MSK pain or swelling Skin: Negative for color change and worsening wound Neurological: Negative for tremors and numbness other than noted  Psychiatric/Behavioral: Negative for decreased concentration or agitation other than above       Objective:   Physical Exam BP 124/74   Pulse 77   Temp 98 F (36.7 C) (Oral)   Resp 20   Wt 151 lb (68.5 kg)   SpO2 91%   BMI 22.96 kg/m  VS noted,  Constitutional: Pt appears in no apparent distress HENT: Head: NCAT.  Right Ear: External ear normal.  Left Ear: External ear normal.  Eyes: . Pupils are equal, round, and reactive to light. Conjunctivae and EOM are normal Neck: Normal range of motion. Neck supple.  Cardiovascular: Normal rate and regular rhythm.   Pulmonary/Chest: Effort normal and breath sounds Decreased right chest with bibas rales (likely dry rales) Abd:  Soft, NT, ND, + BS Neurological: Pt is alert. Not confused , motor grossly intact Skin: Skin is warm. No rash, no LE edema Psychiatric: Pt behavior is normal. No agitation      Assessment & Plan:

## 2016-08-16 NOTE — Assessment & Plan Note (Signed)
Lab Results  Component Value Date   LDLCALC 35 04/30/2016   stable overall by history and exam, recent data reviewed with pt, and pt to continue medical treatment as before,  to f/u any worsening symptoms or concern

## 2016-08-16 NOTE — Assessment & Plan Note (Signed)
stable overall by history and exam, recent data reviewed with pt, and pt to continue medical treatment as before,  to f/u any worsening symptoms or concerns BP Readings from Last 3 Encounters:  08/11/16 124/74  08/03/16 112/62  07/31/16 137/72

## 2016-08-16 NOTE — Assessment & Plan Note (Signed)
stable overall by history and exam, recent data reviewed with pt, and pt to continue medical treatment as before,  to f/u any worsening symptoms or concerns Lab Results  Component Value Date   CREATININE 2.1 (A) 07/29/2016   has f/u with renal next wk

## 2016-08-25 ENCOUNTER — Telehealth (HOSPITAL_COMMUNITY): Payer: Self-pay

## 2016-08-25 NOTE — Telephone Encounter (Signed)
RN with [facility] called to report patient had 5 lb weight gain overnight. VSS, patient not in distress. Advised per Dr. Aundra Dubin ok to take 1 extra Torsemide tablet today until he is seen in our clinic tomorrow (patient cancelled last weeks apt).  Renee Pain, RN

## 2016-08-26 ENCOUNTER — Ambulatory Visit (HOSPITAL_COMMUNITY)
Admission: RE | Admit: 2016-08-26 | Discharge: 2016-08-26 | Disposition: A | Discharge status: Home / Self Care | Payer: Medicare Other | Source: Ambulatory Visit | Attending: Internal Medicine | Admitting: Internal Medicine

## 2016-08-26 VITALS — BP 112/60 | HR 79 | Wt 159.4 lb

## 2016-08-26 DIAGNOSIS — Z79899 Other long term (current) drug therapy: Secondary | ICD-10-CM | POA: Diagnosis not present

## 2016-08-26 DIAGNOSIS — I2583 Coronary atherosclerosis due to lipid rich plaque: Secondary | ICD-10-CM

## 2016-08-26 DIAGNOSIS — I422 Other hypertrophic cardiomyopathy: Secondary | ICD-10-CM | POA: Diagnosis not present

## 2016-08-26 DIAGNOSIS — K3189 Other diseases of stomach and duodenum: Secondary | ICD-10-CM | POA: Diagnosis not present

## 2016-08-26 DIAGNOSIS — E785 Hyperlipidemia, unspecified: Secondary | ICD-10-CM | POA: Diagnosis not present

## 2016-08-26 DIAGNOSIS — R918 Other nonspecific abnormal finding of lung field: Secondary | ICD-10-CM | POA: Diagnosis not present

## 2016-08-26 DIAGNOSIS — I2510 Atherosclerotic heart disease of native coronary artery without angina pectoris: Secondary | ICD-10-CM | POA: Diagnosis not present

## 2016-08-26 DIAGNOSIS — I5042 Chronic combined systolic (congestive) and diastolic (congestive) heart failure: Secondary | ICD-10-CM | POA: Insufficient documentation

## 2016-08-26 DIAGNOSIS — F0390 Unspecified dementia without behavioral disturbance: Secondary | ICD-10-CM | POA: Insufficient documentation

## 2016-08-26 DIAGNOSIS — I130 Hypertensive heart and chronic kidney disease with heart failure and stage 1 through stage 4 chronic kidney disease, or unspecified chronic kidney disease: Secondary | ICD-10-CM | POA: Diagnosis present

## 2016-08-26 DIAGNOSIS — N183 Chronic kidney disease, stage 3 unspecified: Secondary | ICD-10-CM

## 2016-08-26 DIAGNOSIS — K766 Portal hypertension: Secondary | ICD-10-CM | POA: Diagnosis not present

## 2016-08-26 DIAGNOSIS — J9611 Chronic respiratory failure with hypoxia: Secondary | ICD-10-CM

## 2016-08-26 DIAGNOSIS — N179 Acute kidney failure, unspecified: Secondary | ICD-10-CM | POA: Insufficient documentation

## 2016-08-26 DIAGNOSIS — F1020 Alcohol dependence, uncomplicated: Secondary | ICD-10-CM | POA: Insufficient documentation

## 2016-08-26 DIAGNOSIS — K7031 Alcoholic cirrhosis of liver with ascites: Secondary | ICD-10-CM | POA: Insufficient documentation

## 2016-08-26 DIAGNOSIS — I872 Venous insufficiency (chronic) (peripheral): Secondary | ICD-10-CM | POA: Diagnosis not present

## 2016-08-26 DIAGNOSIS — K7030 Alcoholic cirrhosis of liver without ascites: Secondary | ICD-10-CM | POA: Diagnosis not present

## 2016-08-26 DIAGNOSIS — Z7982 Long term (current) use of aspirin: Secondary | ICD-10-CM | POA: Diagnosis not present

## 2016-08-26 DIAGNOSIS — I350 Nonrheumatic aortic (valve) stenosis: Secondary | ICD-10-CM | POA: Diagnosis not present

## 2016-08-26 DIAGNOSIS — I4510 Unspecified right bundle-branch block: Secondary | ICD-10-CM | POA: Diagnosis not present

## 2016-08-26 DIAGNOSIS — K219 Gastro-esophageal reflux disease without esophagitis: Secondary | ICD-10-CM | POA: Diagnosis not present

## 2016-08-26 DIAGNOSIS — G312 Degeneration of nervous system due to alcohol: Secondary | ICD-10-CM | POA: Diagnosis not present

## 2016-08-26 LAB — BASIC METABOLIC PANEL
ANION GAP: 8 (ref 5–15)
BUN: 39 mg/dL — AB (ref 6–20)
CHLORIDE: 106 mmol/L (ref 101–111)
CO2: 24 mmol/L (ref 22–32)
Calcium: 8.9 mg/dL (ref 8.9–10.3)
Creatinine, Ser: 1.92 mg/dL — ABNORMAL HIGH (ref 0.61–1.24)
GFR, EST AFRICAN AMERICAN: 35 mL/min — AB (ref >60)
GFR, EST NON AFRICAN AMERICAN: 30 mL/min — AB (ref >60)
Glucose, Bld: 91 mg/dL (ref 65–99)
POTASSIUM: 6.2 mmol/L — AB (ref 3.5–5.1)
SODIUM: 138 mmol/L (ref 135–145)

## 2016-08-26 LAB — BRAIN NATRIURETIC PEPTIDE: B NATRIURETIC PEPTIDE 5: 1611 pg/mL — AB (ref 0.0–100.0)

## 2016-08-26 MED ORDER — TORSEMIDE 20 MG PO TABS: 40.0000 mg | ORAL_TABLET | Freq: Every day | ORAL | 6 refills | Status: DC

## 2016-08-26 NOTE — Progress Notes (Signed)
Advanced Heart Failure Clinic Note   Primary Care: [name], MD Primary Cardiologist: Dr. Angelena Form HF: Dr. 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  Evaluation revealed findings of a non-STEMI secondary to demand ischemia in setting of chronic kidney disease. Myoview 06/13/16 showed evidence of apparent prior infarct without reversible ischemia.  Echo 06/15/16 LVEF 45-50% with Grade 1 DD. Mild AS + AI.   Treated for cellulitis in ER 06/17/16.  Admitted 8/15 - 07/02/16 with worsening SOB and peripheral edema. Pertinent labs on admission include K 4.7, Creatinine 1.84, BNP 1426. WBC 16.2 and lactic acid 0.59. UA unremarkable. BCx and UCx NGTD as of 06/25/16. CXR with persistent left lower lobe consolidation with mild underlying interstitial prominence. Received dose of vanc/zosyn, then finished course of doxy. HF team consulted with anasarca and poor diuresis.  Diuresed on IV lasix and milrinone added to augment diuresis.  Diuretics eventually held with AKI. Changed from po lasix to torsemide on d/c. Diuresed 24 lbs. Discharge weight 144 lbs.   He presents today for regular follow up. He is up 7 lbs since last visit. Weight at facility 155 this morning. Taking torsemide 20 mg daily, took an extra yesterday as directed. Breathing is "the same". Denies SOB with changing clothes or bathing, overall denies DOE, but admits activity is limited. Finished rehab and is now back in [place] living.  Has 3 shifts of caregivers, so has someone with him all the time. No CP, lightheadedness or dizziness.  Labs  8/30 K 4.6, Cr 2.1, BUN 94.1 9/1 K 5.0, Cr 2.1, BUN 95 9/2 K 4.5, Cr 2.59, BUN  98.9   Past Medical History:  Diagnosis Date  . Acute combined systolic and diastolic CHF, NYHA class 1 ([place])   . Acute respiratory failure ([place])   . Alcoholic cirrhosis ([place])   . 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 Alcoholism ([place])   . Aortic stenosis   . Ascites 11/2004   "early" SBP on paracentesis.   . Chronic gastritis 05/01/2016  . CKD (chronic kidney disease), stage III 10/11/2015  . Colon polyp perhaps in 1990s   pathology/type not known  . Dementia   . Diverticulosis of colon perhaps in 1990s  . Duodenal ulcer 04/2016  . Fracture of wrist 2014 spring vs early summer   refused orthopods suggestion to set the bone.   Marland Kitchen GERD (gastroesophageal reflux disease)   . HLD (hyperlipidemia)   . HTN (hypertension)   . Hypertrophic cardiomyopathy ([place])   . Portal hypertensive gastropathy 2006  . Renal insufficiency   . Rhabdomyolysis 01/2005    Current Outpatient Prescriptions  Medication Sig Dispense Refill  . acetaminophen (TYLENOL) 325 MG tablet Take 650 mg by mouth every 6 (six) hours as needed for pain.    Marland Kitchen ALPRAZolam (XANAX) 0.25 MG tablet Take 1 tablet (0.25 mg total) by mouth at bedtime as needed for anxiety. 10 tablet 0  . aspirin EC 81 MG EC tablet Take 1 tablet (81 mg total) by mouth daily. 30 tablet 0  . cephALEXin (KEFLEX) 500 MG capsule Take 500 mg by mouth 3 (three) times daily.    Marland Kitchen  clopidogrel (PLAVIX) 75 MG tablet TAKE ONE TABLET EACH DAY 30 tablet 3  . isosorbide mononitrate (IMDUR) 30 MG 24 hr tablet Take 30 mg by mouth daily.    . metoprolol succinate (TOPROL-XL) 50 MG 24 hr tablet TAKE ONE TABLET EACH DAY 30 tablet 3  . mirtazapine (REMERON) 15 MG tablet Take 0.5 tablets (7.5 mg total) by mouth at bedtime. 30 tablet 3  . Multiple Vitamin (MULTIVITAMIN WITH MINERALS) TABS Take 1 tablet by mouth daily.    . pantoprazole (PROTONIX) 40 MG tablet TAKE ONE TABLET TWICE DAILY 60 tablet 2  . potassium chloride SA (K-DUR,KLOR-CON) 20 MEQ tablet TAKE ONE TABLET  EACH DAY 30 tablet 3  . saccharomyces boulardii (FLORASTOR) 250 MG capsule Take 250 mg by mouth.    . simvastatin (ZOCOR) 20 MG tablet Take 20 mg by mouth daily.    Marland Kitchen torsemide (DEMADEX) 20 MG tablet TAKE ONE TABLET EACH DAY 30 tablet 3   No current facility-administered medications for this encounter.     No Known Allergies    Social History   Social History  . Marital status: Married    Spouse name: N/A  . Number of children: 4  . Years of education: 24   Occupational History  . pathologist Retired    retired   Social History Main Topics  . Smoking status: Never Smoker  . Smokeless tobacco: Never Used  . Alcohol use No     Comment: member of AA  . Drug use: No  . Sexual activity: Not on file   Other Topics Concern  . 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  Remains independent. ACP- not discussed, will approach at next OV      Family History  Problem Relation Age of Onset  . Cancer Father 73    lung cancer  . Heart disease Mother   . Heart disease Sister     Vitals:   08/26/16 1408  BP: 112/60  Pulse: 79  SpO2: 91%  Weight: 159 lb 6.4 oz (72.3 kg)     Wt Readings from Last 3 Encounters:  08/26/16 159 lb 6.4 oz (72.3 kg)  08/11/16 151 lb (68.5 kg)  07/31/16 149 lb 6.4 oz (67.8 kg)     PHYSICAL EXAM: General:  Elderly appearing.  HEENT: normal Neck: supple. JVP 10-11 cm. Carotids 2+ bilat; no bruits. No thyromegaly or nodule noted. Epidermal cyst left neck. Cor: PMI nondisplaced. RRR. No rubs or gallops. 2/6 AS murmur. Lungs: Persistent bi-basilar crackles Abdomen: soft, NT, ND, no HSM. No bruits or masses. +BS  Extremities: no cyanosis, clubbing, rash. Edema returning. 2+ edema with UNNA boots in place, 1+ edema into thighs.  Neuro: A&O 3, cranial nerves grossly intact. moves all 4 extremities w/o difficulty. Affect pleasant  ASSESSMENT & PLAN:   1. Chronic combined CHF, ICM: EF ~40-45%  with mid to distal anterior, anteroseptal, and apical HK 2. CAD with previous silent LAD infarct. Refuses cardiac cath,  3. Aortic stenosis, Mild 4. Alcoholic Cirrhosis 5. CKD stage 3 6. RBBB  BMET/BNP today.   Pt back in Independent living section of his facility, but caregiver who transported him states he has 24 hours observation and care in that section.    He is up 10-14 lbs from discharge in August.  Torsemide had to be cut back to 20 mg daily from 40 mg BID.  Increase torsemide to 40 mg BID x 2  days and then decrease to 40 mg daily chronic dosing. BMET/BNP today.  Will follow up in clinic 10 days with relative lack of symptoms.   [name] [address]" [place], PA-C 08/26/2016 2:48 PM   Patient seen and examined with [name] Kilts, PA-C. We discussed all aspects of the encounter. I agree with the assessment and plan as stated above.   [name] has recurrent volume overload after we cut back his diuretics due to overdiuresis and renal failure. Will increase torsemide to 40 bid for 2 days then decrease to 40 daily (is currently on 20 daily). We will see him back in 1 week to re-evaluate. He denies any angina and refuses any invasive w/u.   Marea Reasner,MD 12:29 AM

## 2016-08-26 NOTE — Patient Instructions (Signed)
Routine lab work today. Will notify you of abnormal results, otherwise no news is good news!  INCREASE torsemide as follows: Take 40 mg twice daily for 2 weeks. Then reduce to 40 mg once daily.  INCREASE Potassium to 20 meq twice daily for 2 weeks, then reduce dose to 20 meq once daily.  Follow up 2 weeks with [name] Kilts PA-C.  Do the following things EVERYDAY: 1) Weigh yourself in the morning before breakfast. Write it down and keep it in a log. 2) Take your medicines as prescribed 3) Eat low salt foods-Limit salt (sodium) to 2000 mg per day.  4) Stay as active as you can everyday 5) Limit all fluids for the day to less than 2 liters

## 2016-09-02 ENCOUNTER — Telehealth: Payer: Self-pay | Admitting: Internal Medicine

## 2016-09-02 NOTE — Telephone Encounter (Signed)
[name] is calling to get verbals for home o2 concentrator  Written order to be faxed to [phone] attn clinic nurse

## 2016-09-02 NOTE — Telephone Encounter (Signed)
Verbals given  

## 2016-09-02 NOTE — Telephone Encounter (Signed)
Ok for verbals 

## 2016-09-03 ENCOUNTER — Telehealth: Payer: Self-pay

## 2016-09-03 NOTE — Telephone Encounter (Signed)
Done hardcopy to Corinne  

## 2016-09-03 NOTE — Telephone Encounter (Signed)
Please advise they are needing a written order for the O2 home oxygen. They are also needing a documentation of desaturation. Fax number [phone]

## 2016-09-04 NOTE — Telephone Encounter (Signed)
This has been faxed.

## 2016-09-07 ENCOUNTER — Encounter (HOSPITAL_COMMUNITY): Payer: Self-pay

## 2016-09-15 ENCOUNTER — Telehealth (HOSPITAL_COMMUNITY): Payer: Self-pay | Admitting: *Deleted

## 2016-09-15 ENCOUNTER — Telehealth (HOSPITAL_COMMUNITY): Payer: Self-pay | Admitting: Vascular Surgery

## 2016-09-15 NOTE — Telephone Encounter (Signed)
Pt canceled via automated service called pt to resch , PT HAS NO VM, will try back later

## 2016-09-15 NOTE — Telephone Encounter (Signed)
Received call from [facility], pt's wt is up 5 lb from yesterday and they would like to know if pt needs extra Torsemide and if they need standing orders to give extra Torsemide.  Per Dr Haroldine Laws have pt take Torsemide 40 mg BID for 2 days then return to 40 mg daily, anytime pt's wt is up 5 lb give 40 mg BID for 2 days.  LM with info on nurse line and also faxed order to [phone]

## 2016-09-17 ENCOUNTER — Ambulatory Visit (HOSPITAL_COMMUNITY)
Admission: RE | Admit: 2016-09-17 | Discharge: 2016-09-17 | Disposition: A | Discharge status: Home / Self Care | Payer: Medicare Other | Source: Ambulatory Visit | Attending: Internal Medicine | Admitting: Internal Medicine

## 2016-09-17 ENCOUNTER — Encounter (HOSPITAL_COMMUNITY): Payer: Self-pay

## 2016-09-17 VITALS — BP 142/70 | HR 80 | Wt 160.3 lb

## 2016-09-17 DIAGNOSIS — Z8249 Family history of ischemic heart disease and other diseases of the circulatory system: Secondary | ICD-10-CM | POA: Diagnosis not present

## 2016-09-17 DIAGNOSIS — K3189 Other diseases of stomach and duodenum: Secondary | ICD-10-CM | POA: Diagnosis not present

## 2016-09-17 DIAGNOSIS — E875 Hyperkalemia: Secondary | ICD-10-CM | POA: Diagnosis not present

## 2016-09-17 DIAGNOSIS — I5022 Chronic systolic (congestive) heart failure: Secondary | ICD-10-CM | POA: Diagnosis not present

## 2016-09-17 DIAGNOSIS — E785 Hyperlipidemia, unspecified: Secondary | ICD-10-CM | POA: Diagnosis not present

## 2016-09-17 DIAGNOSIS — Z7982 Long term (current) use of aspirin: Secondary | ICD-10-CM | POA: Diagnosis not present

## 2016-09-17 DIAGNOSIS — F0390 Unspecified dementia without behavioral disturbance: Secondary | ICD-10-CM | POA: Insufficient documentation

## 2016-09-17 DIAGNOSIS — K7030 Alcoholic cirrhosis of liver without ascites: Secondary | ICD-10-CM | POA: Insufficient documentation

## 2016-09-17 DIAGNOSIS — I130 Hypertensive heart and chronic kidney disease with heart failure and stage 1 through stage 4 chronic kidney disease, or unspecified chronic kidney disease: Secondary | ICD-10-CM | POA: Diagnosis not present

## 2016-09-17 DIAGNOSIS — K766 Portal hypertension: Secondary | ICD-10-CM | POA: Insufficient documentation

## 2016-09-17 DIAGNOSIS — I5042 Chronic combined systolic (congestive) and diastolic (congestive) heart failure: Secondary | ICD-10-CM | POA: Diagnosis present

## 2016-09-17 DIAGNOSIS — Z79899 Other long term (current) drug therapy: Secondary | ICD-10-CM | POA: Diagnosis not present

## 2016-09-17 DIAGNOSIS — K219 Gastro-esophageal reflux disease without esophagitis: Secondary | ICD-10-CM | POA: Diagnosis not present

## 2016-09-17 DIAGNOSIS — I4510 Unspecified right bundle-branch block: Secondary | ICD-10-CM | POA: Diagnosis not present

## 2016-09-17 DIAGNOSIS — Z801 Family history of malignant neoplasm of trachea, bronchus and lung: Secondary | ICD-10-CM | POA: Insufficient documentation

## 2016-09-17 DIAGNOSIS — G312 Degeneration of nervous system due to alcohol: Secondary | ICD-10-CM | POA: Diagnosis not present

## 2016-09-17 DIAGNOSIS — I422 Other hypertrophic cardiomyopathy: Secondary | ICD-10-CM | POA: Diagnosis not present

## 2016-09-17 DIAGNOSIS — I2510 Atherosclerotic heart disease of native coronary artery without angina pectoris: Secondary | ICD-10-CM | POA: Diagnosis not present

## 2016-09-17 DIAGNOSIS — N183 Chronic kidney disease, stage 3 unspecified: Secondary | ICD-10-CM

## 2016-09-17 DIAGNOSIS — I421 Obstructive hypertrophic cardiomyopathy: Secondary | ICD-10-CM | POA: Insufficient documentation

## 2016-09-17 LAB — BASIC METABOLIC PANEL
ANION GAP: 11 (ref 5–15)
BUN: 50 mg/dL — ABNORMAL HIGH (ref 6–20)
CHLORIDE: 103 mmol/L (ref 101–111)
CO2: 27 mmol/L (ref 22–32)
CREATININE: 2.14 mg/dL — AB (ref 0.61–1.24)
Calcium: 9.4 mg/dL (ref 8.9–10.3)
GFR calc non Af Amer: 26 mL/min — ABNORMAL LOW (ref >60)
GFR, EST AFRICAN AMERICAN: 31 mL/min — AB (ref >60)
Glucose, Bld: 91 mg/dL (ref 65–99)
Potassium: 5.5 mmol/L — ABNORMAL HIGH (ref 3.5–5.1)
SODIUM: 141 mmol/L (ref 135–145)

## 2016-09-17 NOTE — Patient Instructions (Signed)
INCREASE TORSEMIDE TO 60 MG DAILY FOR 3 DAYS, THEN RESUME 40 MG DAILY  Labs today We will only contact you if something comes back abnormal or we need to make some changes. Otherwise no news is good news!  Your physician recommends that you schedule a follow-up appointment in: 2 WEEKS WITH AMY CLEGG,NP

## 2016-09-17 NOTE — Progress Notes (Signed)
Advanced Heart Failure Clinic Note   Primary Care: [name], MD Primary Cardiologist: Dr. Angelena Form HF: Dr. 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  Evaluation revealed findings of a non-STEMI secondary to demand ischemia in setting of chronic kidney disease. Myoview 06/13/16 showed evidence of apparent prior infarct without reversible ischemia.  Echo 06/15/16 LVEF 45-50% with Grade 1 DD. Mild AS + AI.   Admitted 8/15 - 07/02/16 with worsening SOB and peripheral edema. Pertinent labs on admission include K 4.7, Creatinine 1.84, BNP 1426. WBC 16.2 and lactic acid 0.59. UA unremarkable. BCx and UCx NGTD as of 06/25/16. CXR with persistent left lower lobe consolidation with mild underlying interstitial prominence. Received dose of vanc/zosyn, then finished course of doxy. HF team consulted with anasarca and poor diuresis.  Diuresed on IV lasix and milrinone added to augment diuresis.  Diuretics eventually held with AKI. Changed from po lasix to torsemide on d/c. Diuresed 24 lbs. Discharge weight 144 lbs.   He presents today for regular follow up with his son and caregiver.  Last visit torsemide was increased for a few days due to volume overload. Overall says that he is feeling ok. Mild dyspnea with exertion. Sleeps with HOB elevated. Sleeps in a hospital bed. Denies PND. Wears oxygen chronically. Weight has been 150-152 pounds. Drinks > 2 liters per day. Snack on chips, cheetos, and peanuts. Consumes a regular diet. Resides at [facility] independent living.  He has 24 hour caregivers. No CP, lightheadedness or dizziness.  Echo 06/15/16 LVEF 45-50% with Grade 1 DD. Mild AS + AI.    Labs  8/30 K 4.6, Cr 2.1, BUN 94.1 9/1 K 5.0, Cr 2.1, BUN 95 9/2 K 4.5, Cr 2.59, BUN 98.9 08/26/2016; K 6.2 Creatinine 1.92    Past Medical History:  Diagnosis Date  . Acute combined systolic and diastolic CHF, NYHA class 1 ([place])   . Acute respiratory failure ([place])   . Alcoholic cirrhosis ([place])   . 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 Alcoholism ([place])   . Aortic stenosis   . Ascites 11/2004   "early" SBP on paracentesis.   . Chronic gastritis 05/01/2016  . CKD (chronic kidney disease), stage III 10/11/2015  . Colon polyp perhaps in 1990s   pathology/type not known  . Dementia   . Diverticulosis of colon perhaps in 1990s  . Duodenal ulcer 04/2016  . Fracture of wrist 2014 spring vs early summer   refused orthopods suggestion to set the bone.   Marland Kitchen GERD (gastroesophageal reflux disease)   . HLD (hyperlipidemia)   . HTN (hypertension)   . Hypertrophic cardiomyopathy ([place])   . Portal hypertensive gastropathy 2006  . Renal insufficiency   . Rhabdomyolysis 01/2005    Current Outpatient Prescriptions  Medication Sig Dispense Refill  . acetaminophen (TYLENOL) 325 MG tablet Take 650 mg by mouth every 6 (six) hours as needed for pain.    Marland Kitchen ALPRAZolam (XANAX) 0.25 MG tablet Take 1 tablet (0.25 mg total) by mouth at bedtime as needed for anxiety. 10 tablet 0  . aspirin EC 81 MG EC tablet Take 1 tablet (81 mg total) by mouth daily. Elim  tablet 0  . cephALEXin (KEFLEX) 500 MG capsule Take 500 mg by mouth 3 (three) times daily.    . clopidogrel (PLAVIX) 75 MG tablet TAKE ONE TABLET EACH DAY 30 tablet 3  . isosorbide mononitrate (IMDUR) 30 MG 24 hr tablet Take 30 mg by mouth daily.    . metoprolol succinate (TOPROL-XL) 50 MG 24 hr tablet TAKE ONE TABLET EACH DAY 30 tablet 3  . mirtazapine (REMERON) 15 MG tablet Take 0.5 tablets (7.5 mg total) by mouth at bedtime. 30 tablet 3  . Multiple Vitamin (MULTIVITAMIN WITH MINERALS) TABS Take 1 tablet by mouth daily.    . pantoprazole (PROTONIX)  40 MG tablet TAKE ONE TABLET TWICE DAILY 60 tablet 2  . potassium chloride SA (K-DUR,KLOR-CON) 20 MEQ tablet TAKE ONE TABLET EACH DAY 30 tablet 3  . saccharomyces boulardii (FLORASTOR) 250 MG capsule Take 250 mg by mouth.    . simvastatin (ZOCOR) 20 MG tablet Take 20 mg by mouth daily.    Marland Kitchen torsemide (DEMADEX) 20 MG tablet Take 2 tablets (40 mg total) by mouth daily. 60 tablet 6   No current facility-administered medications for this encounter.     No Known Allergies    Social History   Social History  . Marital status: Married    Spouse name: N/A  . Number of children: 4  . Years of education: 24   Occupational History  . pathologist Retired    retired   Social History Main Topics  . Smoking status: Never Smoker  . Smokeless tobacco: Never Used  . Alcohol use No     Comment: member of AA  . Drug use: No  . Sexual activity: Not on file   Other Topics Concern  . 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  Remains independent. ACP- not discussed, will approach at next OV      Family History  Problem Relation Age of Onset  . Cancer Father 5    lung cancer  . Heart disease Mother   . Heart disease Sister     There were no vitals filed for this visit.   Wt Readings from Last 3 Encounters:  09/17/16 160 lb 4.8 oz (72.7 kg)  08/26/16 159 lb 6.4 oz (72.3 kg)  08/11/16 151 lb (68.5 kg)     PHYSICAL EXAM: General:  Elderly appearing. Arrived in a wheel chair. Son and caregiver present.  HEENT: normal Neck: supple. JVP to jaw. Carotids 2+ bilat; no bruits. No thyromegaly or nodule noted.  Cor: PMI nondisplaced. RRR. No rubs or gallops. 2/6 AS murmur. Lungs: Decreased LLL on 2 ltiers oxygen.  Abdomen: soft, NT, ND, no HSM. No bruits or masses. +BS  Extremities: no cyanosis, clubbing, rash. R and LLE 2+ edema with compression stockings in placed.  Neuro: A&O 3, cranial nerves grossly intact. moves all 4  extremities w/o difficulty. Affect pleasant  ASSESSMENT & PLAN:   1. Chronic combined CHF, ICM: EF ~40-45% with mid to distal anterior, anteroseptal, and apical HK Volume status elevated which is likely due to fluid and salt intake.  Increase torsemide to 60 mg daily for 3 days then back to 40 mg daily. Continue 20 meq potassium daily.  Today we discussed limiting all fluid intake to < 2 liters daily and limiting salt to less than 2000 mg. I have asked him to eliminate chips and salty peanuts and to request low salt diet.  Continue daily  weights.   2. CAD with previous silent LAD infarct. No CP. On Statin, bb, and plavic. Refuses cardiac cath,  3. Aortic stenosis, Mild 4. Alcoholic Cirrhosis 5. CKD stage 3- recent creatinine 2.1 6. RBBB 7.Hyperkalemia- Recent K 6.2. BMET today   Follow up in 2 weeks to reassess volume status. He will need BMET at that time.    He denies any angina and refuses any invasive w/u.   Ammanda Dobbins,NP_C  11:06 AM

## 2016-09-18 ENCOUNTER — Telehealth (HOSPITAL_COMMUNITY): Payer: Self-pay | Admitting: *Deleted

## 2016-09-18 NOTE — Telephone Encounter (Signed)
Pt's son called our office stating pt was wanting to know what lab results were yesterday, specifically hgb b/c he was having SOB last nigh.  Son ask [place] to call pt back directly.  Per chart only bmet was obtained with high K level.  Susie, RN called and spoke w/pt, she advised him of labs results and that per OV note yesterday pt had volume overload which could cause his SOB, we did not check cbc as we are cards.  Pt was advised on med changes regarding his K level and about Torsemide increase per OV yesterday.  Pt became very upset that cbc was not checked and stated "everyone checks a cbc why won't you have checked it".  Pt again advised that it was not obtain yesterday as we are cards and not concerned about any bleeding.  Pt then hung up the phone.  Susie, RN then called [facility] and left a detailed message on clinic RN VM stating all the above along with medication changes.  RX with med changes also faxed to them.

## 2016-09-24 ENCOUNTER — Other Ambulatory Visit: Payer: Self-pay | Admitting: Internal Medicine

## 2016-09-28 ENCOUNTER — Telehealth: Payer: Self-pay | Admitting: Internal Medicine

## 2016-09-28 NOTE — Telephone Encounter (Signed)
Patient is requesting to have cbc done at [facility]. He has not had it done in a while. He is concerned about his hemoglobin. Please call [facility]

## 2016-09-29 NOTE — Telephone Encounter (Signed)
I dont normally order lab work at facilities; perhaps the facility provider would do this? Otherwise I can provide a way for the pt to have this done in the Lab in the office

## 2016-09-29 NOTE — Telephone Encounter (Signed)
They are aware

## 2016-10-06 ENCOUNTER — Ambulatory Visit (HOSPITAL_COMMUNITY)
Admission: RE | Admit: 2016-10-06 | Discharge: 2016-10-06 | Disposition: A | Discharge status: Home / Self Care | Payer: Medicare Other | Source: Ambulatory Visit | Attending: Cardiology | Admitting: Cardiology

## 2016-10-06 VITALS — BP 128/70 | HR 78 | Wt 160.0 lb

## 2016-10-06 DIAGNOSIS — F1020 Alcohol dependence, uncomplicated: Secondary | ICD-10-CM | POA: Diagnosis not present

## 2016-10-06 DIAGNOSIS — K219 Gastro-esophageal reflux disease without esophagitis: Secondary | ICD-10-CM | POA: Insufficient documentation

## 2016-10-06 DIAGNOSIS — I421 Obstructive hypertrophic cardiomyopathy: Secondary | ICD-10-CM | POA: Insufficient documentation

## 2016-10-06 DIAGNOSIS — F0390 Unspecified dementia without behavioral disturbance: Secondary | ICD-10-CM | POA: Insufficient documentation

## 2016-10-06 DIAGNOSIS — I5042 Chronic combined systolic (congestive) and diastolic (congestive) heart failure: Secondary | ICD-10-CM | POA: Diagnosis present

## 2016-10-06 DIAGNOSIS — G312 Degeneration of nervous system due to alcohol: Secondary | ICD-10-CM | POA: Insufficient documentation

## 2016-10-06 DIAGNOSIS — I214 Non-ST elevation (NSTEMI) myocardial infarction: Secondary | ICD-10-CM | POA: Insufficient documentation

## 2016-10-06 DIAGNOSIS — I350 Nonrheumatic aortic (valve) stenosis: Secondary | ICD-10-CM

## 2016-10-06 DIAGNOSIS — I422 Other hypertrophic cardiomyopathy: Secondary | ICD-10-CM | POA: Insufficient documentation

## 2016-10-06 DIAGNOSIS — K766 Portal hypertension: Secondary | ICD-10-CM | POA: Diagnosis not present

## 2016-10-06 DIAGNOSIS — I2583 Coronary atherosclerosis due to lipid rich plaque: Secondary | ICD-10-CM

## 2016-10-06 DIAGNOSIS — Z86010 Personal history of colonic polyps: Secondary | ICD-10-CM | POA: Diagnosis not present

## 2016-10-06 DIAGNOSIS — N183 Chronic kidney disease, stage 3 unspecified: Secondary | ICD-10-CM

## 2016-10-06 DIAGNOSIS — K7031 Alcoholic cirrhosis of liver with ascites: Secondary | ICD-10-CM | POA: Insufficient documentation

## 2016-10-06 DIAGNOSIS — J9611 Chronic respiratory failure with hypoxia: Secondary | ICD-10-CM

## 2016-10-06 DIAGNOSIS — Z8249 Family history of ischemic heart disease and other diseases of the circulatory system: Secondary | ICD-10-CM | POA: Diagnosis not present

## 2016-10-06 DIAGNOSIS — K3189 Other diseases of stomach and duodenum: Secondary | ICD-10-CM | POA: Diagnosis not present

## 2016-10-06 DIAGNOSIS — E875 Hyperkalemia: Secondary | ICD-10-CM | POA: Insufficient documentation

## 2016-10-06 DIAGNOSIS — Z7982 Long term (current) use of aspirin: Secondary | ICD-10-CM | POA: Insufficient documentation

## 2016-10-06 DIAGNOSIS — R918 Other nonspecific abnormal finding of lung field: Secondary | ICD-10-CM | POA: Diagnosis not present

## 2016-10-06 DIAGNOSIS — I872 Venous insufficiency (chronic) (peripheral): Secondary | ICD-10-CM

## 2016-10-06 DIAGNOSIS — Z79899 Other long term (current) drug therapy: Secondary | ICD-10-CM | POA: Insufficient documentation

## 2016-10-06 DIAGNOSIS — E785 Hyperlipidemia, unspecified: Secondary | ICD-10-CM | POA: Diagnosis not present

## 2016-10-06 DIAGNOSIS — N179 Acute kidney failure, unspecified: Secondary | ICD-10-CM | POA: Insufficient documentation

## 2016-10-06 DIAGNOSIS — I4510 Unspecified right bundle-branch block: Secondary | ICD-10-CM | POA: Insufficient documentation

## 2016-10-06 DIAGNOSIS — K7030 Alcoholic cirrhosis of liver without ascites: Secondary | ICD-10-CM

## 2016-10-06 DIAGNOSIS — I2510 Atherosclerotic heart disease of native coronary artery without angina pectoris: Secondary | ICD-10-CM | POA: Diagnosis not present

## 2016-10-06 DIAGNOSIS — Z801 Family history of malignant neoplasm of trachea, bronchus and lung: Secondary | ICD-10-CM | POA: Insufficient documentation

## 2016-10-06 DIAGNOSIS — I130 Hypertensive heart and chronic kidney disease with heart failure and stage 1 through stage 4 chronic kidney disease, or unspecified chronic kidney disease: Secondary | ICD-10-CM | POA: Diagnosis not present

## 2016-10-06 LAB — CBC
HEMATOCRIT: 34.9 % — AB (ref 39.0–52.0)
HEMOGLOBIN: 10.6 g/dL — AB (ref 13.0–17.0)
MCH: 30.9 pg (ref 26.0–34.0)
MCHC: 30.4 g/dL (ref 30.0–36.0)
MCV: 101.7 fL — AB (ref 78.0–100.0)
Platelets: 167 10*3/uL (ref 150–400)
RBC: 3.43 MIL/uL — ABNORMAL LOW (ref 4.22–5.81)
RDW: 14.9 % (ref 11.5–15.5)
WBC: 7.8 10*3/uL (ref 4.0–10.5)

## 2016-10-06 LAB — BASIC METABOLIC PANEL
ANION GAP: 9 (ref 5–15)
BUN: 58 mg/dL — AB (ref 6–20)
CO2: 25 mmol/L (ref 22–32)
Calcium: 9.1 mg/dL (ref 8.9–10.3)
Chloride: 107 mmol/L (ref 101–111)
Creatinine, Ser: 2.43 mg/dL — ABNORMAL HIGH (ref 0.61–1.24)
GFR calc Af Amer: 26 mL/min — ABNORMAL LOW (ref >60)
GFR calc non Af Amer: 23 mL/min — ABNORMAL LOW (ref >60)
GLUCOSE: 84 mg/dL (ref 65–99)
POTASSIUM: 5.2 mmol/L — AB (ref 3.5–5.1)
Sodium: 141 mmol/L (ref 135–145)

## 2016-10-06 NOTE — Progress Notes (Signed)
Advanced Heart Failure Clinic Note   Primary Care: [name], [name] Primary Cardiologist: Dr. Angelena Form HF: Dr. Haroldine Laws   HPI: [name] Husk, [name] is an 82 y.o. male with PMH of Combined CHF, HTN, HLD, ETOH abuse and previous diagnosis of HOCM (Though echo has shown basal septal hypertrophy with no clear HOCM physiology.   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  Evaluation revealed findings of a non-STEMI secondary to demand ischemia in setting of chronic kidney disease. Myoview 06/13/16 showed evidence of apparent prior infarct without reversible ischemia.  Echo 06/15/16 LVEF 45-50% with Grade 1 DD. Mild AS + AI.   Admitted 8/15 - 07/02/16 with worsening SOB and peripheral edema. Pertinent labs on admission include K 4.7, Creatinine 1.84, BNP 1426. WBC 16.2 and lactic acid 0.59. UA unremarkable. BCx and UCx NGTD as of 06/25/16. CXR with persistent left lower lobe consolidation with mild underlying interstitial prominence. Received dose of vanc/zosyn, then finished course of doxy. HF team consulted with anasarca and poor diuresis.  Diuresed on IV lasix and milrinone added to augment diuresis.  Diuretics eventually held with AKI. Changed from po lasix to torsemide on d/c. Diuresed 24 lbs. Discharge weight 144 lbs.   He presents today for regular follow up with his daughter and caregiver.  Last visit again had him take extra torsemide for several days due to volume overload. He denies SOB, but daughter shakes her head.  Slightly more fatigues with increased WOB after ADLs, but pt does not note SOB.  Eating well, on low sodium. Has cut out on chips and peanuts since last visit.  No lightheadedness or dizziness. No CP. Remains in [facility] living with 24 hour caregivers. Cut back his fluid intake to 1500 mL, but pt states he is drinking much more than this. They are monitoring his  intake more closely. States weight at facility at 157 lbs. Watching potassium intake as well. Sleeps in hospital bed with HOB elevated.   Echo 06/15/16 LVEF 45-50% with Grade 1 DD. Mild AS + AI.   Labs  8/30 K 4.6, Cr 2.1, BUN 94.1 9/1 K 5.0, Cr 2.1, BUN 95 9/2 K 4.5, Cr 2.59, BUN 98.9 08/26/2016; K 6.2 Creatinine 1.92    Past Medical History:  Diagnosis Date  . Acute combined systolic and diastolic CHF, NYHA class 1 ([place])   . Acute respiratory failure ([place])   . Alcoholic cirrhosis ([place])   . 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 Alcoholism ([place])   . Aortic stenosis   . Ascites 11/2004   "early" SBP on paracentesis.   . Chronic gastritis 05/01/2016  . CKD (chronic kidney disease), stage III 10/11/2015  . Colon polyp perhaps in 1990s   pathology/type not known  . Dementia   . Diverticulosis of colon perhaps in 1990s  . Duodenal ulcer 04/2016  . Fracture of wrist 2014 spring vs early summer   refused orthopods suggestion to set the bone.   Marland Kitchen GERD (gastroesophageal reflux disease)   . HLD (hyperlipidemia)   . HTN (hypertension)   . Hypertrophic cardiomyopathy ([place])   . Portal hypertensive gastropathy 2006  . Renal insufficiency   . Rhabdomyolysis 01/2005    Current Outpatient Prescriptions  Medication Sig Dispense Refill  . acetaminophen (TYLENOL) 325 MG tablet Take 650 mg by mouth every 6 (six) hours as needed for pain.    Marland Kitchen  ALPRAZolam (XANAX) 0.25 MG tablet Take 1 tablet (0.25 mg total) by mouth at bedtime as needed for anxiety. 10 tablet 0  . aspirin EC 81 MG EC tablet Take 1 tablet (81 mg total) by mouth daily. 30 tablet 0  . cephALEXin (KEFLEX) 500 MG capsule Take 500 mg by mouth 3 (three) times daily.    . clopidogrel (PLAVIX) 75 MG tablet TAKE ONE TABLET EACH DAY 30 tablet 3  . isosorbide mononitrate (IMDUR) 30 MG 24 hr tablet Take 30 mg by mouth daily.    . metoprolol succinate (TOPROL-XL) 50 MG 24 hr tablet TAKE ONE TABLET EACH DAY 30 tablet 3  . mirtazapine  (REMERON) 15 MG tablet Take 0.5 tablets (7.5 mg total) by mouth at bedtime. 30 tablet 3  . Multiple Vitamin (MULTIVITAMIN WITH MINERALS) TABS Take 1 tablet by mouth daily.    . pantoprazole (PROTONIX) 40 MG tablet TAKE ONE TABLET TWICE DAILY 60 tablet 2  . saccharomyces boulardii (FLORASTOR) 250 MG capsule Take 250 mg by mouth.    . simvastatin (ZOCOR) 20 MG tablet Take 20 mg by mouth daily.    . simvastatin (ZOCOR) 20 MG tablet TAKE ONE TABLET AT BEDTIME 90 tablet 0  . torsemide (DEMADEX) 20 MG tablet Take 2 tablets (40 mg total) by mouth daily. 60 tablet 6   No current facility-administered medications for this encounter.     No Known Allergies    Social History   Social History  . Marital status: Married    Spouse name: N/A  . Number of children: 4  . Years of education: 24   Occupational History  . pathologist Retired    retired   Social History Main Topics  . Smoking status: Never Smoker  . Smokeless tobacco: Never Used  . Alcohol use No     Comment: member of AA  . Drug use: No  . Sexual activity: Not on file   Other Topics Concern  . 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  Remains independent. ACP- not discussed, will approach at next OV      Family History  Problem Relation Age of Onset  . Cancer Father 29    lung cancer  . Heart disease Mother   . Heart disease Sister     Vitals:   10/06/16 1122  BP: 128/70  Pulse: 78  SpO2: 95%  Weight: 160 lb (72.6 kg)     Wt Readings from Last 3 Encounters:  10/06/16 160 lb (72.6 kg)  09/17/16 160 lb 4.8 oz (72.7 kg)  08/26/16 159 lb 6.4 oz (72.3 kg)     PHYSICAL EXAM: General:  Elderly appearing. Arrived in a wheel chair. Daughter present.   HEENT: Normal Neck: supple. JVP 8-9 cm. Carotids 2+ bilat; no bruits. No thyromegaly or nodule noted.  Cor: PMI nondisplaced. RRR. No rubs or gallops. 2/6 AS murmur. Lungs: Decreased RLL. On 2 L 02. Abdomen:  soft, NT, ND, no HSM. No bruits or masses. +BS  Extremities: no cyanosis, clubbing, rash. Chronic 2+ edema with compression stockings in place.  Neuro: A&O 3, cranial nerves grossly intact. moves all 4 extremities w/o difficulty. Affect  ASSESSMENT & PLAN:   1. Chronic combined CHF, ICM: EF ~40-45% with mid to distal anterior, anteroseptal, and apical HK Volume status elevated which is likely due to fluid and salt intake.  - Continue torsemide 40 mg daily.  Can take extra 20 mg daily  for weight gain of 3 lbs overnight or 5 lbs within on day.   - Pt has cut back on salty junk food but continues to drink > 1500 mL of fluid a day, and likely well over 2 L. Has no intention of cutting back.  Continue daily weights.   2. CAD with previous silent LAD infarct. No CP. On Statin, bb, and plavic.  - Continues to refuse any invasive work up including cardiac cath 3. Aortic stenosis, Mild 4. Alcoholic Cirrhosis 5. CKD stage 3- recent creatinine 2.1- BMET today.  6. RBBB 7.Hyperkalemia-  - Chronic. Repeat BMET today.   Follow up with [name] in 6-8 weeks.  Centrally, volume status looks OK, but continues to have chronic 2+ edema, likely due to chronic venous stasis. Pt is resistant to increases in medications, restriction of diet/fluid, and overall unpleasant towards caregivers/providers.  Makes adjusting his medications regimen difficult.   [name], PA-C 11:32 AM

## 2016-10-06 NOTE — Patient Instructions (Signed)
Labs today We will only contact you if something comes back abnormal or we need to make some changes. Otherwise no news is good news!   Your physician recommends that you schedule a follow-up appointment in: 8 weeks with Bensimhon

## 2016-10-09 ENCOUNTER — Other Ambulatory Visit: Payer: Self-pay | Admitting: Internal Medicine

## 2016-10-22 ENCOUNTER — Other Ambulatory Visit (HOSPITAL_COMMUNITY): Payer: Self-pay | Admitting: Internal Medicine

## 2016-10-27 ENCOUNTER — Telehealth (HOSPITAL_COMMUNITY): Payer: Self-pay

## 2016-10-27 NOTE — Telephone Encounter (Signed)
Labs faxed from patient's facility received. Potassium 6.0 (5.8 last week). No supplemental K or other medications to increase serum K. Patient reports low potassium diet as advised last week. Per Amy Clegg NP-C, ordered one dose of 15 mg kayexalate. Staff at [facility] report patient is in independent living and manages medication personally. Patient called to make aware of this medication/plan  Patient requested Rx to be sent to [facility].  Advised patient to pick this up now to take. Staff at [facility] also made aware of high potassium and to check on patient periodically. Lab order for repeat bmet in 1 week faxed to [facility] at provided fax # [phone]  Renee Pain, RN

## 2016-10-29 ENCOUNTER — Telehealth (HOSPITAL_COMMUNITY): Payer: Self-pay | Admitting: *Deleted

## 2016-10-29 MED ORDER — SODIUM POLYSTYRENE SULFONATE 15 GM/60ML PO SUSP: 15.0000 g | Freq: Once | ORAL | 0 refills | Status: AC

## 2016-10-29 NOTE — Telephone Encounter (Signed)
Mliss Sax, RN from [facility] called again saying prescription for Kayexalate 15mg  was never sent into [facility] and asked for me to send it now.   Prescription sent as requested.

## 2016-10-29 NOTE — Telephone Encounter (Signed)
Mliss Sax, RN from [facility] called saying she never received orders to repeat BMET next week.  She also asked for an order to collect a CBC.    Verbal order for CBC given by Darrick Grinder, NP and orders for both labs were faxed to [id number].

## 2016-11-04 ENCOUNTER — Telehealth (HOSPITAL_COMMUNITY): Payer: Self-pay | Admitting: *Deleted

## 2016-11-04 NOTE — Telephone Encounter (Signed)
Mliss Sax, RN at [facility] called to verify that we received lab results on patient yesterday.  I called and confirmed that we did receive them and that they are waiting to be reviewed.  No further questions at this time.

## 2016-11-05 ENCOUNTER — Other Ambulatory Visit (HOSPITAL_COMMUNITY): Payer: Self-pay | Admitting: Internal Medicine

## 2016-11-06 ENCOUNTER — Telehealth (HOSPITAL_COMMUNITY): Payer: Self-pay

## 2016-11-06 ENCOUNTER — Telehealth: Payer: Self-pay | Admitting: Internal Medicine

## 2016-11-06 NOTE — Telephone Encounter (Signed)
Requesting 2-3 liters of oxygen continuously.  Patient currently on 2.  Would like to be able to bump up if patient gets short of breath.  Can take verbal.

## 2016-11-06 NOTE — Telephone Encounter (Signed)
[facility] RN calling to confirm if we received labs on the 27th and if they were reviewed by provider. Per Flonnie Hailstone phone note on 12/27, labs were received and reviewed, no changes/new orders. RN reports patient doing well with no concerns.  Renee Pain, RN

## 2016-11-06 NOTE — Telephone Encounter (Signed)
[facility]

## 2016-11-10 ENCOUNTER — Telehealth (HOSPITAL_COMMUNITY): Payer: Self-pay | Admitting: *Deleted

## 2016-11-10 NOTE — Telephone Encounter (Signed)
Mliss Sax, RN from [facility] called reporting patient to have increased shortness of breath over the past few days.  BP this morning was 100/60 with O2 sats in the upper 90's.    I called patient directly and spoke with him about his shortness of breath and he denied having any shortness of breath and stated he was fine. He has a follow up appointment with DB this month.

## 2016-11-11 ENCOUNTER — Telehealth (HOSPITAL_COMMUNITY): Payer: Self-pay | Admitting: *Deleted

## 2016-11-11 NOTE — Telephone Encounter (Signed)
Mliss Sax called concerned about pt's SOB, she states she left a mess yesterday and is calling to f/u.  She states pt is still more SOB and has LE edema.  She states pcp gave order to increase his O2 to 3L which they did but it has not helped pt.  She states he has a hard time weighing and they could not get a weight on him today.  Ubaldo Glassing, RN called pt yesterday and he denied sob, she states pt has short term memory loss and does not always remember things, even this AM he was telling her he took his meds but they were still in his pill box.  She states she feels pt really needs to be seen, appt sch for tomorrow at 2 pm

## 2016-11-12 ENCOUNTER — Ambulatory Visit (HOSPITAL_COMMUNITY)
Admission: RE | Admit: 2016-11-12 | Discharge: 2016-11-12 | Disposition: A | Discharge status: Home / Self Care | Payer: Medicare Other | Source: Ambulatory Visit | Attending: Internal Medicine | Admitting: Internal Medicine

## 2016-11-12 ENCOUNTER — Encounter (HOSPITAL_COMMUNITY): Payer: Self-pay | Admitting: Internal Medicine

## 2016-11-12 VITALS — BP 126/60 | HR 71 | Wt 167.0 lb

## 2016-11-12 DIAGNOSIS — N183 Chronic kidney disease, stage 3 (moderate): Secondary | ICD-10-CM | POA: Insufficient documentation

## 2016-11-12 DIAGNOSIS — I350 Nonrheumatic aortic (valve) stenosis: Secondary | ICD-10-CM | POA: Diagnosis not present

## 2016-11-12 DIAGNOSIS — Z801 Family history of malignant neoplasm of trachea, bronchus and lung: Secondary | ICD-10-CM | POA: Diagnosis not present

## 2016-11-12 DIAGNOSIS — K219 Gastro-esophageal reflux disease without esophagitis: Secondary | ICD-10-CM | POA: Insufficient documentation

## 2016-11-12 DIAGNOSIS — I5043 Acute on chronic combined systolic (congestive) and diastolic (congestive) heart failure: Secondary | ICD-10-CM | POA: Insufficient documentation

## 2016-11-12 DIAGNOSIS — Z86010 Personal history of colonic polyps: Secondary | ICD-10-CM | POA: Diagnosis not present

## 2016-11-12 DIAGNOSIS — F0390 Unspecified dementia without behavioral disturbance: Secondary | ICD-10-CM | POA: Diagnosis not present

## 2016-11-12 DIAGNOSIS — I5022 Chronic systolic (congestive) heart failure: Secondary | ICD-10-CM | POA: Diagnosis not present

## 2016-11-12 DIAGNOSIS — I421 Obstructive hypertrophic cardiomyopathy: Secondary | ICD-10-CM | POA: Diagnosis not present

## 2016-11-12 DIAGNOSIS — Z79899 Other long term (current) drug therapy: Secondary | ICD-10-CM | POA: Insufficient documentation

## 2016-11-12 DIAGNOSIS — I130 Hypertensive heart and chronic kidney disease with heart failure and stage 1 through stage 4 chronic kidney disease, or unspecified chronic kidney disease: Secondary | ICD-10-CM | POA: Insufficient documentation

## 2016-11-12 DIAGNOSIS — Z7982 Long term (current) use of aspirin: Secondary | ICD-10-CM | POA: Insufficient documentation

## 2016-11-12 DIAGNOSIS — E785 Hyperlipidemia, unspecified: Secondary | ICD-10-CM | POA: Diagnosis not present

## 2016-11-12 DIAGNOSIS — E875 Hyperkalemia: Secondary | ICD-10-CM | POA: Insufficient documentation

## 2016-11-12 DIAGNOSIS — I422 Other hypertrophic cardiomyopathy: Secondary | ICD-10-CM | POA: Diagnosis not present

## 2016-11-12 DIAGNOSIS — I4510 Unspecified right bundle-branch block: Secondary | ICD-10-CM | POA: Insufficient documentation

## 2016-11-12 DIAGNOSIS — I2510 Atherosclerotic heart disease of native coronary artery without angina pectoris: Secondary | ICD-10-CM | POA: Diagnosis not present

## 2016-11-12 DIAGNOSIS — G312 Degeneration of nervous system due to alcohol: Secondary | ICD-10-CM | POA: Insufficient documentation

## 2016-11-12 DIAGNOSIS — R918 Other nonspecific abnormal finding of lung field: Secondary | ICD-10-CM | POA: Diagnosis not present

## 2016-11-12 DIAGNOSIS — Z8249 Family history of ischemic heart disease and other diseases of the circulatory system: Secondary | ICD-10-CM | POA: Insufficient documentation

## 2016-11-12 DIAGNOSIS — K7031 Alcoholic cirrhosis of liver with ascites: Secondary | ICD-10-CM | POA: Insufficient documentation

## 2016-11-12 DIAGNOSIS — K3189 Other diseases of stomach and duodenum: Secondary | ICD-10-CM | POA: Diagnosis not present

## 2016-11-12 DIAGNOSIS — K766 Portal hypertension: Secondary | ICD-10-CM | POA: Diagnosis not present

## 2016-11-12 DIAGNOSIS — F1020 Alcohol dependence, uncomplicated: Secondary | ICD-10-CM | POA: Diagnosis not present

## 2016-11-12 LAB — BASIC METABOLIC PANEL
Anion gap: 10 (ref 5–15)
BUN: 79 mg/dL — AB (ref 6–20)
CO2: 24 mmol/L (ref 22–32)
Calcium: 9.1 mg/dL (ref 8.9–10.3)
Chloride: 108 mmol/L (ref 101–111)
Creatinine, Ser: 2.93 mg/dL — ABNORMAL HIGH (ref 0.61–1.24)
GFR calc Af Amer: 21 mL/min — ABNORMAL LOW (ref >60)
GFR, EST NON AFRICAN AMERICAN: 18 mL/min — AB (ref >60)
GLUCOSE: 118 mg/dL — AB (ref 65–99)
POTASSIUM: 5.1 mmol/L (ref 3.5–5.1)
Sodium: 142 mmol/L (ref 135–145)

## 2016-11-12 LAB — BRAIN NATRIURETIC PEPTIDE: B Natriuretic Peptide: 1915.4 pg/mL — ABNORMAL HIGH (ref 0.0–100.0)

## 2016-11-12 MED ORDER — TORSEMIDE 20 MG PO TABS: ORAL_TABLET | ORAL | 6 refills | Status: DC

## 2016-11-12 NOTE — Patient Instructions (Signed)
Change Torsemide to 40 mg in AM and 20 mg in PM  Labs today  Labs in 3 weeks at facility  Your physician recommends that you schedule a follow-up appointment in: 6 weeks with Dr Haroldine Laws

## 2016-11-12 NOTE — Progress Notes (Signed)
Advanced Heart Failure Clinic Note   Primary Care: [name] Cower, MD Primary Cardiologist: Dr. Angelena [name] HF: Dr. 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  Evaluation revealed findings of a non-STEMI secondary to demand ischemia in setting of chronic kidney disease. Myoview 06/13/16 showed evidence of apparent prior infarct without reversible ischemia.  Echo 06/15/16 LVEF 45-50% with Grade 1 DD. Mild AS + AI.   Admitted 8/15 - 07/02/16 with worsening SOB and peripheral edema. Pertinent labs on admission include K 4.7, Creatinine 1.84, BNP 1426. WBC 16.2 and lactic acid 0.59. UA unremarkable. BCx and UCx NGTD as of 06/25/16. CXR with persistent left lower lobe consolidation with mild underlying interstitial prominence. Received dose of vanc/zosyn, then finished course of doxy. HF team consulted with anasarca and poor diuresis.  Diuresed on IV lasix and milrinone added to augment diuresis.  Diuretics eventually held with AKI. Changed from po lasix to torsemide on d/c. Diuresed 24 lbs. Discharge weight 144 lbs.   He presents today for add on due to worsening SOB.  RN from his independent living facility has been calling with reports of worsening SOB. Pt is up 7 lbs from last visit. Per son they've noticed some worsening SOB over the past week.  Patient is adamant that he doesn't feel any worse or think he has any edema. Not eating high salt.  Goal fluid intake of 1500 mL and he states that he has been cut off at some times, having met that goal. Weight at the facility 168.7 this am. Sleeps in hospital bed with [facility] elevated chronically (45 degrees). Caregiver states he does get SOB with minimal  exertion such as changing clothes or while being bathed.   Echo 06/15/16 LVEF 45-50% with Grade 1 DD. Mild AS + AI.   Labs  8/30 K 4.6, Cr 2.1, BUN 94.1 9/1 K 5.0, Cr 2.1, BUN 95 9/2 K 4.5, Cr 2.59, BUN 98.9 08/26/2016; K 6.2 Creatinine 1.92    Past Medical History:  Diagnosis Date  . Acute combined systolic and diastolic CHF, NYHA class 1 ([place])   . Acute respiratory failure ([place])   . Alcoholic cirrhosis ([place])   . 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 Alcoholism ([place])   . Aortic stenosis   . Ascites 11/2004   "early" SBP on paracentesis.   . Chronic gastritis 05/01/2016  . CKD (chronic kidney disease), stage III 10/11/2015  . Colon polyp perhaps in 1990s   pathology/type not known  . Dementia   . Diverticulosis of colon perhaps in 1990s  . Duodenal ulcer 04/2016  . Fracture of wrist 2014 spring vs early summer   refused orthopods suggestion to set the bone.   Marland Kitchen GERD (gastroesophageal reflux disease)   . HLD (hyperlipidemia)   . HTN (hypertension)   . Hypertrophic cardiomyopathy ([place])   . Portal hypertensive gastropathy 2006  . Renal insufficiency   . Rhabdomyolysis 01/2005    Current Outpatient Prescriptions  Medication Sig Dispense Refill  . acetaminophen (TYLENOL) 325 MG tablet Take 650 mg by mouth every 6 (six) hours as needed for pain.    Marland Kitchen ALPRAZolam (XANAX) 0.25 MG tablet Take 1  tablet (0.25 mg total) by mouth at bedtime as needed for anxiety. 10 tablet 0  . aspirin EC 81 MG EC tablet Take 1 tablet (81 mg total) by mouth daily. 30 tablet 0  . cephALEXin (KEFLEX) 500 MG capsule Take 500 mg by mouth 3 (three) times daily.    . clopidogrel (PLAVIX) 75 MG tablet TAKE ONE TABLET EACH DAY 30 tablet 3  . isosorbide mononitrate (IMDUR) 30 MG 24 hr tablet Take 30 mg by mouth daily.    . metoprolol succinate (TOPROL-XL) 50 MG 24 hr tablet TAKE ONE TABLET EACH DAY 30 tablet 3  . mirtazapine (REMERON) 15 MG tablet Take 0.5 tablets (7.5 mg total) by mouth at bedtime. 30 tablet  3  . Multiple Vitamin (MULTIVITAMIN WITH MINERALS) TABS Take 1 tablet by mouth daily.    . pantoprazole (PROTONIX) 40 MG tablet TAKE ONE TABLET TWICE DAILY 60 tablet 0  . saccharomyces boulardii (FLORASTOR) 250 MG capsule Take 250 mg by mouth.    . simvastatin (ZOCOR) 20 MG tablet TAKE ONE TABLET AT BEDTIME 90 tablet 0  . torsemide (DEMADEX) 20 MG tablet Take 2 tablets (40 mg total) by mouth daily. 60 tablet 6   No current facility-administered medications for this encounter.     No Known Allergies    Social History   Social History  . Marital status: Married    Spouse name: N/A  . Number of children: 4  . Years of education: 24   Occupational History  . pathologist Retired    retired   Social History Main Topics  . Smoking status: Never Smoker  . Smokeless tobacco: Never Used  . Alcohol use No     Comment: member of AA  . Drug use: No  . Sexual activity: Not on file   Other Topics Concern  . 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  Remains independent. ACP- not discussed, will approach at next OV      Family History  Problem Relation Age of Onset  . Cancer Father 58    lung cancer  . Heart disease Mother   . Heart disease Sister     Vitals:   11/12/16 1420  BP: 126/60  Pulse: 71  SpO2: (!) 88%  Weight: 167 lb (75.8 kg)     Wt Readings from Last 3 Encounters:  11/12/16 167 lb (75.8 kg)  10/06/16 160 lb (72.6 kg)  09/17/16 160 lb 4.8 oz (72.7 kg)     PHYSICAL EXAM: General:  Elderly and fatigued appearing. In [place]. Son present. HEENT: Normal Neck: supple. JVP 11-12 cm. Carotids 2+ bilat; no bruits. No thyromegaly or nodule noted.  Cor: PMI nondisplaced. RRR. No rubs or gallops. 2/6 AS murmur. Lungs: Diminished basilar sounds with basilar crackles. On 3 L 02. Abdomen: soft, NT, ND, no HSM. No bruits or masses. +BS  Extremities: no cyanosis, clubbing, rash. Chronic 2-3 + edema with compression  stockings in place up to knees. Neuro: A&O 3, cranial nerves grossly intact. moves all 4 extremities w/o difficulty. Affect flat.  ASSESSMENT & PLAN:   1. Acute on chronic combined CHF, ICM: EF ~40-45% with mid to distal anterior, anteroseptal, and apical HK - Volume status elevated. Diuretic adjustment has been difficult due to renal function. Independent facility monitoring fluid and salt intake.   - Increase torsemide to 40 mg q am and 20 mg q pm. BMET/BNP today.   - Reinforced fluid  restriction to < 2 L daily, sodium restriction to less than 2000 mg daily, and the importance of daily weights.   2. CAD with previous silent LAD infarct. No CP. On Statin, bb, and plavic.  - No CP - Pt refuses any invasive work up including cardiac cath.  3. Aortic stenosis, Mild 4. Alcoholic Cirrhosis 5. CKD stage 3 - Recent creatinine up to 2.5 => 2.2 on recheck.  - BMET today.  6. RBBB 7.Hyperkalemia-  - Chronic. Up to 6.0 recently. - Most recent check 5.1. BMET/BNP today.   Increase torsemide as above. BMET/BNP today. Repeat BMET 3 weeks at facility.  Follow up 6 weeks with MD.   [name], PA-C 2:42 PM  Patient seen and examined with Oda Kilts, PA-C. We discussed all aspects of the encounter. I agree with the assessment and plan as stated above.   Volume status mildly elevated but not as bad as he was during past admission when edema extended into his thighs. Suspect not all weight gain is fluid. Will increase torsemide as above. Renal function recently improved. Will get BMET today and in 2-3 weeks. He knows to call if getting worse. Hyperkalemia is stable. Can add [place] if needed.   Diogenes Whirley,MD 10:15 PM

## 2016-11-13 ENCOUNTER — Other Ambulatory Visit: Payer: Self-pay | Admitting: Internal Medicine

## 2016-11-23 ENCOUNTER — Other Ambulatory Visit (HOSPITAL_COMMUNITY): Payer: Self-pay | Admitting: *Deleted

## 2016-11-23 MED ORDER — TORSEMIDE 20 MG PO TABS: ORAL_TABLET | ORAL | 3 refills | Status: DC

## 2016-11-26 ENCOUNTER — Other Ambulatory Visit: Payer: Self-pay | Admitting: Internal Medicine

## 2016-12-01 ENCOUNTER — Ambulatory Visit: Payer: Self-pay | Admitting: Internal Medicine

## 2016-12-03 ENCOUNTER — Other Ambulatory Visit: Payer: Self-pay | Admitting: Internal Medicine

## 2016-12-03 ENCOUNTER — Telehealth: Payer: Self-pay | Admitting: Internal Medicine

## 2016-12-03 ENCOUNTER — Encounter (HOSPITAL_COMMUNITY): Payer: Self-pay | Admitting: Internal Medicine

## 2016-12-03 DIAGNOSIS — I5041 Acute combined systolic (congestive) and diastolic (congestive) heart failure: Secondary | ICD-10-CM | POA: Diagnosis not present

## 2016-12-03 DIAGNOSIS — I5040 Unspecified combined systolic (congestive) and diastolic (congestive) heart failure: Secondary | ICD-10-CM | POA: Diagnosis not present

## 2016-12-03 DIAGNOSIS — J9601 Acute respiratory failure with hypoxia: Secondary | ICD-10-CM | POA: Diagnosis not present

## 2016-12-03 NOTE — Telephone Encounter (Signed)
Pt called request Dr. [name] to give him something for UTI send to [name]. Please advise.

## 2016-12-03 NOTE — Telephone Encounter (Signed)
Very sorry, needs OV as office policy is to evaluate prior to antibx to make sure is needed and what kind of infection it might be

## 2016-12-03 NOTE — Telephone Encounter (Signed)
This is not normally a refillable medication  Please consider OV

## 2016-12-04 NOTE — Telephone Encounter (Signed)
Called patient. He states he feels fine so doesn't want to make an OV. Thanks.

## 2016-12-09 ENCOUNTER — Ambulatory Visit: Payer: Self-pay | Admitting: Internal Medicine

## 2016-12-09 ENCOUNTER — Other Ambulatory Visit (HOSPITAL_COMMUNITY): Payer: Self-pay | Admitting: Internal Medicine

## 2016-12-10 ENCOUNTER — Other Ambulatory Visit: Payer: Self-pay | Admitting: Internal Medicine

## 2016-12-14 ENCOUNTER — Other Ambulatory Visit (HOSPITAL_COMMUNITY): Payer: Self-pay | Admitting: Internal Medicine

## 2016-12-24 ENCOUNTER — Ambulatory Visit (HOSPITAL_COMMUNITY)
Admission: RE | Admit: 2016-12-24 | Discharge: 2016-12-24 | Disposition: A | Discharge status: Home / Self Care | Payer: Medicare Other | Source: Ambulatory Visit | Attending: Internal Medicine | Admitting: Internal Medicine

## 2016-12-24 ENCOUNTER — Encounter (HOSPITAL_COMMUNITY): Payer: Self-pay | Admitting: Internal Medicine

## 2016-12-24 VITALS — BP 142/66 | HR 77 | Wt 164.0 lb

## 2016-12-24 DIAGNOSIS — I2510 Atherosclerotic heart disease of native coronary artery without angina pectoris: Secondary | ICD-10-CM | POA: Insufficient documentation

## 2016-12-24 DIAGNOSIS — Z79899 Other long term (current) drug therapy: Secondary | ICD-10-CM | POA: Insufficient documentation

## 2016-12-24 DIAGNOSIS — I5022 Chronic systolic (congestive) heart failure: Secondary | ICD-10-CM

## 2016-12-24 DIAGNOSIS — E875 Hyperkalemia: Secondary | ICD-10-CM | POA: Diagnosis not present

## 2016-12-24 DIAGNOSIS — Z7982 Long term (current) use of aspirin: Secondary | ICD-10-CM | POA: Insufficient documentation

## 2016-12-24 DIAGNOSIS — K7031 Alcoholic cirrhosis of liver with ascites: Secondary | ICD-10-CM | POA: Diagnosis not present

## 2016-12-24 DIAGNOSIS — E785 Hyperlipidemia, unspecified: Secondary | ICD-10-CM | POA: Diagnosis not present

## 2016-12-24 DIAGNOSIS — K3189 Other diseases of stomach and duodenum: Secondary | ICD-10-CM | POA: Diagnosis not present

## 2016-12-24 DIAGNOSIS — Z8249 Family history of ischemic heart disease and other diseases of the circulatory system: Secondary | ICD-10-CM | POA: Diagnosis not present

## 2016-12-24 DIAGNOSIS — N184 Chronic kidney disease, stage 4 (severe): Secondary | ICD-10-CM | POA: Diagnosis not present

## 2016-12-24 DIAGNOSIS — I422 Other hypertrophic cardiomyopathy: Secondary | ICD-10-CM | POA: Insufficient documentation

## 2016-12-24 DIAGNOSIS — K219 Gastro-esophageal reflux disease without esophagitis: Secondary | ICD-10-CM | POA: Insufficient documentation

## 2016-12-24 DIAGNOSIS — Z86010 Personal history of colonic polyps: Secondary | ICD-10-CM | POA: Diagnosis not present

## 2016-12-24 DIAGNOSIS — Z801 Family history of malignant neoplasm of trachea, bronchus and lung: Secondary | ICD-10-CM | POA: Diagnosis not present

## 2016-12-24 DIAGNOSIS — I4510 Unspecified right bundle-branch block: Secondary | ICD-10-CM | POA: Diagnosis not present

## 2016-12-24 DIAGNOSIS — K766 Portal hypertension: Secondary | ICD-10-CM | POA: Insufficient documentation

## 2016-12-24 DIAGNOSIS — I130 Hypertensive heart and chronic kidney disease with heart failure and stage 1 through stage 4 chronic kidney disease, or unspecified chronic kidney disease: Secondary | ICD-10-CM | POA: Diagnosis not present

## 2016-12-24 DIAGNOSIS — I5042 Chronic combined systolic (congestive) and diastolic (congestive) heart failure: Secondary | ICD-10-CM | POA: Diagnosis not present

## 2016-12-24 DIAGNOSIS — I350 Nonrheumatic aortic (valve) stenosis: Secondary | ICD-10-CM

## 2016-12-24 DIAGNOSIS — N183 Chronic kidney disease, stage 3 unspecified: Secondary | ICD-10-CM

## 2016-12-24 DIAGNOSIS — F1020 Alcohol dependence, uncomplicated: Secondary | ICD-10-CM | POA: Diagnosis not present

## 2016-12-24 DIAGNOSIS — F0390 Unspecified dementia without behavioral disturbance: Secondary | ICD-10-CM | POA: Diagnosis not present

## 2016-12-24 DIAGNOSIS — G312 Degeneration of nervous system due to alcohol: Secondary | ICD-10-CM | POA: Diagnosis not present

## 2016-12-24 LAB — BASIC METABOLIC PANEL
ANION GAP: 13 (ref 5–15)
BUN: 61 mg/dL — ABNORMAL HIGH (ref 6–20)
CALCIUM: 9.1 mg/dL (ref 8.9–10.3)
CO2: 32 mmol/L (ref 22–32)
Chloride: 95 mmol/L — ABNORMAL LOW (ref 101–111)
Creatinine, Ser: 2.34 mg/dL — ABNORMAL HIGH (ref 0.61–1.24)
GFR, EST AFRICAN AMERICAN: 28 mL/min — AB (ref >60)
GFR, EST NON AFRICAN AMERICAN: 24 mL/min — AB (ref >60)
Glucose, Bld: 84 mg/dL (ref 65–99)
POTASSIUM: 5 mmol/L (ref 3.5–5.1)
SODIUM: 140 mmol/L (ref 135–145)

## 2016-12-24 NOTE — Progress Notes (Signed)
Advanced Heart Failure Clinic Note   Primary Care: [name], MD Primary Cardiologist: Dr. Angelena Form HF: Dr. 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  Evaluation revealed findings of a non-STEMI secondary to demand ischemia in setting of chronic kidney disease. Myoview 06/13/16 showed evidence of apparent prior infarct without reversible ischemia.  Echo 06/15/16 LVEF 45-50% with Grade 1 DD. Mild AS + AI.   Admitted 8/15 - 07/02/16 with worsening SOB and peripheral edema. Pertinent labs on admission include K 4.7, Creatinine 1.84, BNP 1426. WBC 16.2 and lactic acid 0.59. UA unremarkable. BCx and UCx NGTD as of 06/25/16. CXR with persistent left lower lobe consolidation with mild underlying interstitial prominence. Received dose of vanc/zosyn, then finished course of doxy. HF team consulted with anasarca and poor diuresis.  Diuresed on IV lasix and milrinone added to augment diuresis.  Diuretics eventually held with AKI. Changed from po lasix to torsemide on d/c. Diuresed 24 lbs. Discharge weight 144 lbs.   Remains at [facility] At last visit fluid was up and torsemide increased to 40/20. Weight down 3-4 pounds. Says edema much improved. Denies SOB, orthopnea, PND. Taking all meds as prescribed.   Echo 06/15/16 LVEF 45-50% with Grade 1 DD. Mild AS + AI.   Labs  8/30 K 4.6, Cr 2.1, BUN 94.1 9/1 K 5.0, Cr 2.1, BUN 95 9/2 K 4.5, Cr 2.59, BUN 98.9 08/26/2016; K 6.2 Creatinine 1.92    Past Medical History:  Diagnosis Date  . Acute combined systolic and diastolic CHF, NYHA class 1 ([place])   . Acute respiratory failure ([place])   . Alcoholic cirrhosis ([place])   . 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 Alcoholism ([place])   . Aortic stenosis   . Ascites 11/2004   "early" SBP on paracentesis.   . Chronic gastritis 05/01/2016  . CKD (chronic kidney disease), stage III 10/11/2015  . Colon polyp perhaps in 1990s   pathology/type not known  . Dementia   . Diverticulosis of colon perhaps in 1990s  . Duodenal ulcer 04/2016  . Fracture of wrist 2014 spring vs early summer   refused orthopods suggestion to set the bone.   Marland Kitchen GERD (gastroesophageal reflux disease)   . HLD (hyperlipidemia)   . HTN (hypertension)   . Hypertrophic cardiomyopathy ([place])   . Portal hypertensive gastropathy 2006  . Renal insufficiency   . Rhabdomyolysis 01/2005    Current Outpatient Prescriptions  Medication Sig Dispense Refill  . acetaminophen (TYLENOL) 325 MG tablet Take 650 mg by mouth every 6 (six) hours as needed for pain.    Marland Kitchen ALPRAZolam (XANAX) 0.25 MG tablet Take 1 tablet (0.25 mg total) by mouth at bedtime as needed for anxiety. 10 tablet 0  . aspirin EC 81 MG EC tablet Take 1 tablet (81 mg total) by mouth daily. 30 tablet 0  . cephALEXin (KEFLEX) 500 MG capsule Take 500 mg by mouth 3 (three) times daily.    . clopidogrel (PLAVIX) 75 MG tablet TAKE ONE TABLET EVERY DAY 90 tablet 0  . isosorbide mononitrate (IMDUR) 30 MG 24 hr tablet Take 30 mg by mouth daily.    . metoprolol succinate (  TOPROL-XL) 50 MG 24 hr tablet TAKE ONE TABLET EVERY DAY 30 tablet 0  . mirtazapine (REMERON) 15 MG tablet Take 0.5 tablets (7.5 mg total) by mouth at bedtime. 30 tablet 3  . Multiple Vitamin (MULTIVITAMIN WITH MINERALS) TABS Take 1 tablet by mouth daily.    . pantoprazole (PROTONIX) 40 MG tablet TAKE ONE TABLET TWICE DAILY 60 tablet 0  . saccharomyces boulardii (FLORASTOR) 250 MG capsule Take 250 mg by mouth.    . simvastatin (ZOCOR) 20 MG tablet TAKE ONE TABLET AT BEDTIME 90 tablet 0  . torsemide (DEMADEX) 20 MG tablet Take 40 mg (2 tabs) in AM and 20 mg (1 tab) in PM 90 tablet 3   No current  facility-administered medications for this encounter.     No Known Allergies    Social History   Social History  . Marital status: Married    Spouse name: N/A  . Number of children: 4  . Years of education: 24   Occupational History  . pathologist Retired    retired   Social History Main Topics  . Smoking status: Never Smoker  . Smokeless tobacco: Never Used  . Alcohol use No     Comment: member of AA  . Drug use: No  . Sexual activity: Not on file   Other Topics Concern  . 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  Remains independent. ACP- not discussed, will approach at next OV      Family History  Problem Relation Age of Onset  . Cancer Father 20    lung cancer  . Heart disease Mother   . Heart disease Sister     Vitals:   12/24/16 1437  BP: (!) 142/66  Pulse: 77  SpO2: 92%  Weight: 164 lb (74.4 kg)     Wt Readings from Last 3 Encounters:  12/24/16 164 lb (74.4 kg)  11/12/16 167 lb (75.8 kg)  10/06/16 160 lb (72.6 kg)     PHYSICAL EXAM: General:  Sitting in WC. NAD Brighter today HEENT: Normal Neck: supple. JVP 6-7 cm. Carotids 2+ bilat; + bruits. No thyromegaly or nodule noted.  Cor: PMI nondisplaced. RRR. No rubs or gallops. 2/6 AS murmur. Lungs: Diminished basilar sounds with basilar crackles. On 3 L 02. Abdomen: soft, NT, ND, no HSM. No bruits or masses. +BS  Extremities: no cyanosis, clubbing, rash. Trace edema with compression stockings in place up to knees. Neuro: A&O 3, cranial nerves grossly intact. moves all 4 extremities w/o difficulty. Affect pleasant   ASSESSMENT & PLAN:   1. Chronic combined CHF, ICM: EF ~45-50% (8/17) with mid to distal anterior, anteroseptal, and apical HK - Volume status looks great on current regimen.  - Continue torsemide to 40 mg q am and 20 mg q pm. BMET today.   - Reinforced fluid restriction to < 2 L daily, sodium restriction to less than 2000 mg  daily, and the importance of daily weights.   2. CAD with previous silent LAD infarct. No CP. On Statin, bb, and plavic.  - No CP - Pt refuses any invasive work up including cardiac cath.  3. Aortic stenosis, Mild 4. Alcoholic Cirrhosis 5. CKD stage 3-4 - Recent creatinine up to 2.5 => 2.2 on recheck.  - BMET today.  6. RBBB 7.Hyperkalemia-  - Chronic. - Most recent check 5.1. BMET/BNP today.  -  Can add V[place] if needed.   Bensimhon, Daniel,MD 2:56 PM

## 2016-12-24 NOTE — Addendum Note (Signed)
Encounter addended by: Kerry Dory, CMA on: 12/24/2016  3:10 PM<BR>    Actions taken: Order list changed, Diagnosis association updated, Sign clinical note

## 2016-12-24 NOTE — Patient Instructions (Signed)
Labs today We will only contact you if something comes back abnormal or we need to make some changes. Otherwise no news is good news!   Your physician recommends that you schedule a follow-up appointment in: 3 months with Dr Haroldine Laws

## 2016-12-30 ENCOUNTER — Ambulatory Visit: Payer: Self-pay | Admitting: Internal Medicine

## 2017-01-01 ENCOUNTER — Other Ambulatory Visit: Payer: Self-pay | Admitting: Internal Medicine

## 2017-01-05 ENCOUNTER — Ambulatory Visit: Payer: Self-pay | Admitting: Internal Medicine

## 2017-01-15 ENCOUNTER — Other Ambulatory Visit: Payer: Self-pay | Admitting: Internal Medicine

## 2017-01-21 ENCOUNTER — Other Ambulatory Visit: Payer: Self-pay | Admitting: Internal Medicine

## 2017-01-22 NOTE — Telephone Encounter (Signed)
Requested Prescriptions   Refused Prescriptions Disp Refills  . simvastatin (ZOCOR) 20 MG tablet [Pharmacy Med Name: SIMVASTATIN 20MG  TAB] 90 tablet     Sig: TAKE ONE TABLET AT BEDTIME    Refused By: Juliet Rude    Reason for Refusal: Patient needs an appointment     Last OV 08/11/2016. OV note stated pt should return in 29mo or sooner.   Pt was informed that an appt is needed .

## 2017-01-29 ENCOUNTER — Other Ambulatory Visit: Payer: Self-pay | Admitting: Internal Medicine

## 2017-02-08 ENCOUNTER — Telehealth: Payer: Self-pay | Admitting: Internal Medicine

## 2017-02-08 NOTE — Telephone Encounter (Signed)
Unfortunately, office policy is to avoid phone prescribing of antibiotics without an office visit.  Please encourage pt to make ROV or UC

## 2017-02-08 NOTE — Telephone Encounter (Signed)
[facility] nurse called, states patients R big toe is infected. Patient does not want to come in. The nurse at [facility] would like an abx called in. They are trying to talk him into come in, but since he does not want to, Please advise.   [phone] Mliss Sax

## 2017-02-09 NOTE — Telephone Encounter (Signed)
Nurse is going to talk to him about the importance of coming in. He does have an appointment April 4 at [facility] with Dr. [name].

## 2017-02-10 ENCOUNTER — Ambulatory Visit (INDEPENDENT_AMBULATORY_CARE_PROVIDER_SITE_OTHER): Payer: Medicare Other | Admitting: Internal Medicine

## 2017-02-11 NOTE — Telephone Encounter (Signed)
I decline  Pt should seek Primary care elsewhere, if this is not acceptable

## 2017-02-11 NOTE — Telephone Encounter (Signed)
Patient called in about this. He does not want to come in office. 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 He wanted to make sure you knew he was a doctor. And to please call him and talk to him or just send him in a abx.   He had an appointment yesterday but no showed.

## 2017-02-12 ENCOUNTER — Telehealth: Payer: Self-pay

## 2017-02-12 NOTE — Telephone Encounter (Signed)
appt scheduled per Dr. Mariea Clonts

## 2017-02-12 NOTE — Telephone Encounter (Signed)
[name] May nurse at [facility] called and stated that [name] is a new patient and will have his first visit in May but wants to know if patient could be seen sooner. Patient has a wound on right great toe that they have been dressing. Please call and advise

## 2017-02-12 NOTE — Telephone Encounter (Signed)
Spoke with the nurse and informed her patient would need an appointment. They are going to talk to him again about coming in.

## 2017-02-12 NOTE — Telephone Encounter (Signed)
noted 

## 2017-02-12 NOTE — Telephone Encounter (Signed)
[facility] called back, states Dr. Vernard Gambles is going to switch over to Dr. Joneen Caraway at [facility]. They requested all this medical records. I sent them over to medical records so that they could do that for them.

## 2017-02-16 DIAGNOSIS — L03031 Cellulitis of right toe: Secondary | ICD-10-CM | POA: Diagnosis not present

## 2017-02-16 DIAGNOSIS — M79671 Pain in right foot: Secondary | ICD-10-CM | POA: Diagnosis not present

## 2017-02-16 DIAGNOSIS — L84 Corns and callosities: Secondary | ICD-10-CM | POA: Diagnosis not present

## 2017-02-16 DIAGNOSIS — L602 Onychogryphosis: Secondary | ICD-10-CM | POA: Diagnosis not present

## 2017-02-17 ENCOUNTER — Non-Acute Institutional Stay: Payer: Medicare Other | Admitting: Internal Medicine

## 2017-02-17 ENCOUNTER — Encounter: Payer: Self-pay | Admitting: Internal Medicine

## 2017-02-17 VITALS — BP 130/60 | HR 80 | Temp 98.3°F | Wt 150.0 lb

## 2017-02-17 DIAGNOSIS — N183 Chronic kidney disease, stage 3 unspecified: Secondary | ICD-10-CM

## 2017-02-17 DIAGNOSIS — Z23 Encounter for immunization: Secondary | ICD-10-CM | POA: Diagnosis not present

## 2017-02-17 DIAGNOSIS — F0391 Unspecified dementia with behavioral disturbance: Secondary | ICD-10-CM | POA: Diagnosis not present

## 2017-02-17 DIAGNOSIS — I5043 Acute on chronic combined systolic (congestive) and diastolic (congestive) heart failure: Secondary | ICD-10-CM

## 2017-02-17 DIAGNOSIS — I872 Venous insufficiency (chronic) (peripheral): Secondary | ICD-10-CM | POA: Diagnosis not present

## 2017-02-17 DIAGNOSIS — L03031 Cellulitis of right toe: Secondary | ICD-10-CM

## 2017-02-17 DIAGNOSIS — K7030 Alcoholic cirrhosis of liver without ascites: Secondary | ICD-10-CM

## 2017-02-17 DIAGNOSIS — J9611 Chronic respiratory failure with hypoxia: Secondary | ICD-10-CM | POA: Diagnosis not present

## 2017-02-17 DIAGNOSIS — L219 Seborrheic dermatitis, unspecified: Secondary | ICD-10-CM | POA: Diagnosis not present

## 2017-02-17 DIAGNOSIS — D500 Iron deficiency anemia secondary to blood loss (chronic): Secondary | ICD-10-CM

## 2017-02-17 MED ORDER — KETOCONAZOLE 2 % EX SHAM: 1.0000 "application " | MEDICATED_SHAMPOO | CUTANEOUS | 0 refills | Status: AC

## 2017-02-17 NOTE — Progress Notes (Signed)
Provider:  Rexene Edison. Mariea Clonts, D.O., C.M.D. Location:  Artist of Service:  Clinic (12)  Previous PCP: Hollace Kinnier, DO Patient Care Team: Gayland Curry, DO as PCP - General (Geriatric Medicine) Hillary Bow, MD (Cardiology) Sable Feil, MD (Gastroenterology) Luberta Mutter, MD (Ophthalmology)  Extended Emergency Contact Information Primary Emergency Contact: Salomon Mast [place] of [place] Phone: [phone] Relation: Son Secondary Emergency Contact: [place] of [place] Mobile Phone: [phone] Relation: Son  Code Status: full code Goals of Care: Advanced Directive information Advanced Directives 09/17/2016  Does Patient Have a Medical Advance Directive? No  Type of Advance Directive -  Does patient want to make changes to medical advance directive? -  Copy of [place] in Chart? -  Would patient like information on creating a medical advance directive? No - patient declined information  Pre-existing out of facility DNR order (yellow form or pink MOST form) -  Some encounter information is confidential and restricted. Go to Review Flowsheets activity to see all data.   Chief Complaint  Patient presents with  . Establish Care    new patient previously seen during a rehab stay    HPI: Patient is a 83 y.o. male seen today to establish with [facility].  Records have been requested and received from Dr. [name] and are also in [facility] anyway.  Pt had to be called by administration early this am to be reminded and encouraged to attend his appointment.    He agrees to get pneumovax-23 which is not in his records.    Chronic respiratory failure/chronic mixed CHF:  His sats are 88% on 3 L [place].  Says he does not feel short of breath.  Has refused 2/3 doses of extra torsemide offered to him for weight gain in the past couple of weeks.  Caregiver did not bring his weights with  her to appt, but will call us back with them shortly.  He sees Dr. Haroldine Laws 03/24/17 for his next cardiology/chf f/u.  Denies dyspnea on exertion.  Not exercising much.  No chest pain.  Does have severe edema.    Per weight results received later in day:  Weight is down 14 lbs from February.   3/27 146 3/28 145.8 3/29 147  3/30 148.4 3/31 147  4/1 145.3 4/2 not done--?refused 4/3 144.2 4/4 146.8 4/5 144.6 4/6 not done--?refused 4/7 147.8  4/8 not done--?refused 4/9 147 4/10 147 4/11 147.1  Here, he was 150 but our scale seems perpetually higher than home scales  He reports feeling fine and being well taken care of.  Says food is good.  People are very nice here.  [place] is really not capable to taking care of himself.  He has 24 hr caregivers, but refuses their help for his baths.    Says he banged his great toe somehow and it's got infected.  Not bothersome unless touched.  Saw podiatry yesterday, Dr. Mallie Mussel, here at [facility] and he was given a prescription of keflex 500mg  po tid for 10 days.  This was received by [facility] med mgt this am and they were going to pick it up and be sure he has it by this evening.  He says the toe is not red vs the other toes but is tender.    Sleeps well at night.  Says he is always in good spirits.  Mood unchanged per  caregiver.  He seems to be sarcastic and grouchy much of the time per staff reports.  Dementia:  Does have 24 hour caregiver. Lacks insight into his severity of illness and cognitive losses.  Says he does not do much bathing, but says he's been having a sponge bath.  He says whirlpool bath was closed (not true).  Independent living nurse set up for him to go but he refused.  Has been having a sore bottom from shearing and using endit cream after baths, but refusing baths so likely not actually getting the cream applied either.  Caregiver reports some blood when wiping him.      Past Medical History:  Diagnosis  Date  . Acute combined systolic and diastolic CHF, NYHA class 1 ([place])   . Acute respiratory failure ([place])   . Alcoholic cirrhosis ([place])   . Alcoholic encephalopathy ([place]) 01/2005  . Alcoholism ([place])   . Aortic stenosis   . Ascites 11/2004   "early" SBP on paracentesis.   . Chronic gastritis 05/01/2016  . CKD (chronic kidney disease), stage III 10/11/2015  . Colon polyp perhaps in 1990s   pathology/type not known  . Dementia   . Diverticulosis of colon perhaps in 1990s  . Duodenal ulcer 04/2016  . Fracture of wrist 2014 spring vs early summer   refused orthopods suggestion to set the bone.   Marland Kitchen GERD (gastroesophageal reflux disease)   . HLD (hyperlipidemia)   . HTN (hypertension)   . Hypertrophic cardiomyopathy ([place])   . Portal hypertensive gastropathy 2006  . Renal insufficiency   . Rhabdomyolysis 01/2005   Past Surgical History:  Procedure Laterality Date  . BASAL CELL CARCINOMA EXCISION Left 11/2012   lower eyelid  . ESOPHAGOGASTRODUODENOSCOPY N/A 08/04/2013   Procedure: ESOPHAGOGASTRODUODENOSCOPY (EGD);  Surgeon: Ladene Artist, MD;  Location[facility] ENDOSCOPY;  Service: Endoscopy;  Laterality: N/A;  . ESOPHAGOGASTRODUODENOSCOPY (EGD) WITH PROPOFOL N/A 05/01/2016   Procedure: ESOPHAGOGASTRODUODENOSCOPY (EGD) WITH PROPOFOL;  Surgeon: Jerene Bears, MD;  Location: [facility] ENDOSCOPY;  Service: Endoscopy;  Laterality: N/A;  . INGUINAL HERNIA REPAIR    . MELANOMA EXCISION Left 02/2011   malignant melanoma of thigh    Social History   Social History  . Marital status: Married    Spouse name: N/A  . Number of children: 4  . Years of education: 24   Occupational History  . pathologist Retired    retired   Social History Main Topics  . Smoking status: Never Smoker  . Smokeless tobacco: Never Used  . Alcohol use No     Comment: member of AA  . Drug use: No  . Sexual activity: Not Asked   Other Topics Concern  . 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  Remains independent. ACP- not discussed, will approach at next OV    reports that he has never smoked. He has never used smokeless tobacco. He reports that he does not drink alcohol or use drugs.  Functional Status Survey:  requires assistance with all adls except feeding, uses manual wheelchair to get around  Family History  Problem Relation Age of Onset  . Cancer Father 72    lung cancer  . Heart disease Mother   . Heart disease Sister     Health Maintenance  Topic Date Due  . PNA vac Low Risk Adult (2 of 2 - PPSV23) 06/15/2015  . INFLUENZA VACCINE  06/09/2017  . TETANUS/TDAP  06/14/2024  No Known Allergies  Allergies as of 02/17/2017   No Known Allergies     Medication List       Accurate as of 02/17/17  8:28 AM. Always use your most recent med list.          acetaminophen 325 MG tablet Commonly known as:  TYLENOL Take 650 mg by mouth every 6 (six) hours as needed for pain.   ALPRAZolam 0.25 MG tablet Commonly known as:  XANAX Take 0.25 mg by mouth every 8 (eight) hours as needed for anxiety.   aspirin 81 MG EC tablet Take 1 tablet (81 mg total) by mouth daily.   clopidogrel 75 MG tablet Commonly known as:  PLAVIX TAKE ONE TABLET EVERY DAY   isosorbide mononitrate 30 MG 24 hr tablet Commonly known as:  IMDUR Take 30 mg by mouth daily.   metoprolol succinate 50 MG 24 hr tablet Commonly known as:  TOPROL-XL TAKE ONE TABLET EACH DAY   mirtazapine 15 MG tablet Commonly known as:  REMERON Take 0.5 tablets (7.5 mg total) by mouth at bedtime.   multivitamin with minerals Tabs tablet Take 1 tablet by mouth daily.   nitroGLYCERIN 0.4 MG SL tablet Commonly known as:  NITROSTAT Place 0.4 mg under the tongue every 5 (five) minutes as needed for chest pain.   pantoprazole 40 MG tablet Commonly known as:  PROTONIX TAKE ONE TABLET TWICE DAILY   simvastatin 20 MG tablet Commonly known as:  ZOCOR TAKE ONE TABLET AT BEDTIME     torsemide 20 MG tablet Commonly known as:  DEMADEX Take 40 mg by mouth every morning. Take 20 mg by mouth every evening   torsemide 20 MG tablet Commonly known as:  DEMADEX Take 20 mg by mouth as needed (for 3lbs weight gain overnight or 5lbs in one week).       Review of Systems  Constitutional: Negative for chills, fever and malaise/fatigue.       Note that his responses are not accurate due to his dementia and lack of insight  HENT: Negative for congestion and hearing loss.   Eyes: Negative for blurred vision.  Respiratory: Negative for cough, shortness of breath and wheezing.   Cardiovascular: Positive for leg swelling. Negative for chest pain, palpitations, orthopnea and PND.  Gastrointestinal: Negative for abdominal pain, blood in stool, constipation and melena.  Genitourinary: Positive for frequency and urgency. Negative for dysuria, flank pain and hematuria.  Musculoskeletal: Negative for falls.  Skin:       Dry scaly skin on scalp and in hair; right great toe wound/paranychia  Neurological: Negative for dizziness, loss of consciousness and weakness.  Endo/Heme/Allergies: Bruises/bleeds easily.  Psychiatric/Behavioral: Positive for memory loss. Negative for depression. The patient is not nervous/anxious and does not have insomnia.     Vitals:   02/17/17 0816  BP: 130/60  Pulse: 80  Temp: 98.3 F (36.8 C)  TempSrc: Oral  SpO2: (!) 88%  Weight: 150 lb (68 kg)   Body mass index is 22.81 kg/m. Physical Exam  Constitutional:  Chronically ill, frail appearing male seated hunched over in his wheelchair  Cardiovascular: Normal rate and regular rhythm.   Murmur heard. 3+ edema right leg, dry scaly skin and stasis dermatitis; left with compression sock  Pulmonary/Chest: Effort normal.  Diminished breath sounds at bases  Abdominal: Soft. Bowel sounds are normal. He exhibits no distension. There is no tenderness.  Musculoskeletal: Normal range of motion.  Neurological:  He is alert. No cranial nerve deficit.  Oriented to person and place, not time, very impatient  Skin:  Seborrhea of scalp/hair; right great toe with absence of epidermis around toenail with erythema and drainage, foul odor (after bandaid removed), very erythematous on tip  Psychiatric:  Flat affect; lacks insight into his conditions and denies having cirrhosis, shortness of breath, etc.  Not able to take care of himself; hygiene poor--has odor    Labs reviewed: Basic Metabolic Panel:  Recent Labs  06/30/16 0508  07/02/16 0542  07/15/16 1106  10/06/16 1155 11/12/16 1514 12/24/16 1509  NA 138  < > 138  < > 137  < > 141 142 140  K 3.6  < > 3.7  < > 4.6  < > 5.2* 5.1 5.0  CL 98*  < > 97*  --  104  < > 107 108 95*  CO2 30  < > 32  --  24  < > 25 24 32  GLUCOSE 137*  < > 107*  --  147*  < > 84 118* 84  BUN 76*  < > 80*  < > 87*  < > 58* 79* 61*  CREATININE 2.67*  < > 2.36*  < > 2.17*  < > 2.43* 2.93* 2.34*  CALCIUM 8.5*  < > 8.8*  --  9.0  < > 9.1 9.1 9.1  MG 1.6*  --  1.9  --  1.9  --   --   --   --   < > = values in this interval not displayed. Liver Function Tests:  Recent Labs  04/13/16 1046 04/30/16 0422 06/12/16 0556  AST 21 19 24   ALT 17 15* 15*  ALKPHOS 75 63 85  BILITOT 0.7 0.6 0.6  PROT 6.7 5.5* 6.0*  ALBUMIN 3.5 2.7* 3.0*   No results for input(s): LIPASE, AMYLASE in the last 8760 hours. No results for input(s): AMMONIA in the last 8760 hours. CBC:  Recent Labs  05/28/16 1556 06/12/16 0556 06/23/16 0705  07/26/16 0447 07/26/16 0453 07/28/16 0010 10/06/16 1155  WBC 9.7 8.3 16.2*  < > 8.2  --  8.4 7.8  NEUTROABS 7.5 5.8 12.1*  --   --   --   --   --   HGB 11.6* 10.3* 11.1*  < > 10.3* 10.5* 10.3* 10.6*  HCT 35.3* 33.3* 35.2*  < > 33.3* 31.0* 33.2* 34.9*  MCV 93.9 99.7 100.0  < > 97.7  --  98.2 101.7*  PLT 234.0 229 369  < > 248  --  243 167  < > = values in this interval not displayed. Cardiac Enzymes:  Recent Labs  06/23/16 0705 07/26/16 0447  07/26/16 1059  TROPONINI 0.13* 0.40* 1.03*   BNP: Invalid input(s): POCBNP Lab Results  Component Value Date   HGBA1C 5.7 02/22/2015   Lab Results  Component Value Date   TSH 0.74 10/23/2015   Lab Results  Component Value Date   VITAMINB12 562 05/26/2011   Lab Results  Component Value Date   FOLATE >24.8 05/26/2011   Lab Results  Component Value Date   IRON 38 (L) 05/26/2011   FERRITIN 16.4 (L) 05/26/2011    Imaging and Procedures noted on new patient packet: Reviewed Dr. Gwynn Burly and epic specialty notes  Assessment/Plan 1. Need for pneumococcal vaccine - Pneumococcal polysaccharide vaccine 23-valent greater than or equal to 2yo subcutaneous/IM given  2. Seborrheic dermatitis of scalp - ordered nizoral twice weekly when he is to go get whirlpool bath in rehab -counseled on importance  of bathing and, at the time, he agreed to go twice weekly--orders were written - ketoconazole (NIZORAL) 2 % shampoo; Apply 1 application topically 2 (two) times a week. To scalp and hair during whirlpool in rehab  Dispense: 120 mL; Refill: 0 nizoral shampoo twice weekly to scalp and hair when gets whirlpool bath (rx sent to [facility])  3. Chronic venous insufficiency -ongoing, continue compression sock to left lower leg after bathing  4. Chronic respiratory failure with hypoxia (HCC) -continue O2 to keep sats over 90%   5. CKD (chronic kidney disease) stage 3, GFR 30-59 ml/min -f/u bmp to reassess -cont current diuretic regimen, but counseled on importance of taking diuretic when weight goes up (not that he will recall this conversation when it comes up)  6. Iron deficiency anemia due to chronic blood loss -f/u cbc, cont to monitor, not currently on iron, is on asa and plavix for CAD and has alcoholic cirrhosis  7. Acute on chronic combined systolic and diastolic heart failure (HCC) -Extra torsemide 20mg  po daily x 3 days, then resume usual dose of 40mg  daily due to decreased  breath sounds at bases, increased peripheral edema and decreased sats   8. Alcoholic cirrhosis of liver without ascites ([place]) -ongoing, pt in denial that he has this, I"m told he is still drinking a considerable amount of alcohol even now despite his frailty and debility  9. Dementia with behavioral disturbance, unspecified dementia type -he really needs SNF level care at this point due to his dementia and multiple other comorbid illnesses--he lacks insight into his disease and really is not able to make safe decisions for himself--needs a family member to take over this responsibility to keep him safe and out of the hospital  10. Paronychia of great toe of right foot whirlpool bath twice a week in rehab Cover right foot with bag and secure at ankle during bath Change bandaid and gauze daily on right great toe f/u with clinic nurse weekly re: toe appearance  Labs/tests ordered:  Cbc, bmp, liver panel, flp before on 5/15 at [facility], keep 5/23 appt with me and appt with Dr. Haroldine Laws  [place] L. Chase Arnall, D.O. [facility] [address], [place] [address] Cell Phone (Mon-Fri 8am-5pm):  [phone] On Call:  [phone] & follow prompts after 5pm & weekends Office Phone:  [phone] Office Fax:  [phone]

## 2017-02-24 ENCOUNTER — Other Ambulatory Visit: Payer: Self-pay | Admitting: Internal Medicine

## 2017-02-24 DIAGNOSIS — L97509 Non-pressure chronic ulcer of other part of unspecified foot with unspecified severity: Secondary | ICD-10-CM

## 2017-02-24 MED ORDER — MUPIROCIN 2 % EX OINT: TOPICAL_OINTMENT | CUTANEOUS | 0 refills | Status: DC

## 2017-02-28 ENCOUNTER — Encounter: Payer: Self-pay | Admitting: Internal Medicine

## 2017-03-05 ENCOUNTER — Other Ambulatory Visit: Payer: Self-pay | Admitting: *Deleted

## 2017-03-05 MED ORDER — ALPRAZOLAM 0.25 MG PO TABS: 0.2500 mg | ORAL_TABLET | Freq: Three times a day (TID) | ORAL | 0 refills | Status: DC | PRN

## 2017-03-05 NOTE — Telephone Encounter (Signed)
[name] 

## 2017-03-11 ENCOUNTER — Other Ambulatory Visit: Payer: Self-pay | Admitting: Internal Medicine

## 2017-03-18 ENCOUNTER — Encounter: Payer: Self-pay | Admitting: Internal Medicine

## 2017-03-18 DIAGNOSIS — D649 Anemia, unspecified: Secondary | ICD-10-CM | POA: Diagnosis not present

## 2017-03-18 DIAGNOSIS — R195 Other fecal abnormalities: Secondary | ICD-10-CM | POA: Diagnosis not present

## 2017-03-18 LAB — BASIC METABOLIC PANEL
BUN: 63 mg/dL — AB (ref 4–21)
Creatinine: 2.2 mg/dL — AB (ref 0.6–1.3)
Glucose: 145 mg/dL
POTASSIUM: 5.2 mmol/L (ref 3.4–5.3)
SODIUM: 141 mmol/L (ref 137–147)

## 2017-03-18 LAB — CBC AND DIFFERENTIAL
HEMATOCRIT: 34 % — AB (ref 41–53)
Hemoglobin: 10.4 g/dL — AB (ref 13.5–17.5)
PLATELETS: 150 10*3/uL (ref 150–399)
WBC: 7.7 10^3/mL

## 2017-03-23 ENCOUNTER — Encounter: Payer: Self-pay | Admitting: Internal Medicine

## 2017-03-23 DIAGNOSIS — N183 Chronic kidney disease, stage 3 (moderate): Secondary | ICD-10-CM | POA: Diagnosis not present

## 2017-03-23 DIAGNOSIS — E785 Hyperlipidemia, unspecified: Secondary | ICD-10-CM | POA: Diagnosis not present

## 2017-03-23 DIAGNOSIS — Z79899 Other long term (current) drug therapy: Secondary | ICD-10-CM | POA: Diagnosis not present

## 2017-03-23 DIAGNOSIS — I872 Venous insufficiency (chronic) (peripheral): Secondary | ICD-10-CM | POA: Diagnosis not present

## 2017-03-23 DIAGNOSIS — J9611 Chronic respiratory failure with hypoxia: Secondary | ICD-10-CM | POA: Diagnosis not present

## 2017-03-23 LAB — LIPID PANEL
Cholesterol: 138 mg/dL (ref 0–200)
HDL: 94 mg/dL — AB (ref 35–70)
LDL Cholesterol: 30 mg/dL
Triglycerides: 71 mg/dL (ref 40–160)

## 2017-03-23 LAB — HEPATIC FUNCTION PANEL
ALT: 8 U/L — AB (ref 10–40)
AST: 20 U/L (ref 14–40)
Alkaline Phosphatase: 86 U/L (ref 25–125)
Bilirubin, Total: 0.3 mg/dL

## 2017-03-24 ENCOUNTER — Encounter (HOSPITAL_COMMUNITY): Payer: Self-pay | Admitting: Internal Medicine

## 2017-03-25 ENCOUNTER — Other Ambulatory Visit (HOSPITAL_COMMUNITY): Payer: Self-pay | Admitting: Internal Medicine

## 2017-03-25 ENCOUNTER — Encounter: Payer: Self-pay | Admitting: Internal Medicine

## 2017-03-31 ENCOUNTER — Encounter: Payer: Self-pay | Admitting: Internal Medicine

## 2017-03-31 ENCOUNTER — Non-Acute Institutional Stay: Payer: Medicare Other | Admitting: Internal Medicine

## 2017-03-31 VITALS — BP 130/70 | HR 93 | Temp 98.5°F | Wt 145.0 lb

## 2017-03-31 DIAGNOSIS — J9611 Chronic respiratory failure with hypoxia: Secondary | ICD-10-CM | POA: Diagnosis not present

## 2017-03-31 DIAGNOSIS — I5043 Acute on chronic combined systolic (congestive) and diastolic (congestive) heart failure: Secondary | ICD-10-CM | POA: Diagnosis not present

## 2017-03-31 DIAGNOSIS — N183 Chronic kidney disease, stage 3 unspecified: Secondary | ICD-10-CM

## 2017-03-31 DIAGNOSIS — F0391 Unspecified dementia with behavioral disturbance: Secondary | ICD-10-CM

## 2017-03-31 DIAGNOSIS — K7030 Alcoholic cirrhosis of liver without ascites: Secondary | ICD-10-CM

## 2017-03-31 DIAGNOSIS — I872 Venous insufficiency (chronic) (peripheral): Secondary | ICD-10-CM

## 2017-03-31 DIAGNOSIS — D649 Anemia, unspecified: Secondary | ICD-10-CM

## 2017-03-31 NOTE — Progress Notes (Signed)
Location:  Occupational psychologist of Service:  Clinic (12)  Provider: Kristiann Noyce L. Mariea Clonts, D.O., C.M.D.  Code Status: DNR Goals of Care:  Advanced Directives 02/17/2017  Does Patient Have a Medical Advance Directive? Yes  Type of Paramedic of [place];Living will  Does patient want to make changes to medical advance directive? No - Patient declined  Copy of [place] in Chart? Yes  Would patient like information on creating a medical advance directive? -  Pre-existing out of facility DNR order (yellow form or pink MOST form) -  Some encounter information is confidential and restricted. Go to Review Flowsheets activity to see all data.     Chief Complaint  Patient presents with  . Medical Management of Chronic Issues    40mth follow-up    HPI: Patient is a 83 y.o. male seen today for medical management of chronic diseases.    I had asked for him to return to f/u on his CHF and to reassess his memory.  He passed his clock drawing.  He scored 22/30 on his MMSE today.  Did not know season, date, day, month, attention, calculation and recall.   His weight is down 5 lbs.  His oxygen is worse ranging 86-89% here this am with slight tachycardia.   He's on 3L [place].  He is in bed the majority of the time. He denies sob, but is inactive.    Infected toe has completely resolved and he's able to wear his compression hose.    He says he drinks all the alcohol he can get--he says he gets "a" glass of wine with each meal, but I've heard reports that he's drinking considerably more.    He is clean and dry now--has been accepting whirlpool bath.  Cold until he gets into it.  Getting there is a production.  Going twice a week.    He had some dark slimy green tarry stools that were loose last week--the hemoccult was negative 5/10.  His hgb has been stable in the 10 range.  Past Medical History:  Diagnosis Date  . Acute combined systolic  and diastolic CHF, NYHA class 1 ([place])   . Acute respiratory failure ([place])   . Alcoholic cirrhosis ([place])   . 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 Alcoholism ([place])   . Aortic stenosis   . Ascites 11/2004   "early" SBP on paracentesis.   . Chronic gastritis 05/01/2016  . CKD (chronic kidney disease), stage III 10/11/2015  . Colon polyp perhaps in 1990s   pathology/type not known  . Dementia   . Diverticulosis of colon perhaps in 1990s  . Duodenal ulcer 04/2016  . Fracture of wrist 2014 spring vs early summer   refused orthopods suggestion to set the bone.   Marland Kitchen GERD (gastroesophageal reflux disease)   . HLD (hyperlipidemia)   . HTN (hypertension)   . Hypertrophic cardiomyopathy ([place])   . Portal hypertensive gastropathy ([place]) 2006  . Renal insufficiency   . Rhabdomyolysis 01/2005    Past Surgical History:  Procedure Laterality Date  . BASAL CELL CARCINOMA EXCISION Left 11/2012   lower eyelid  . ESOPHAGOGASTRODUODENOSCOPY N/A 08/04/2013   Procedure: ESOPHAGOGASTRODUODENOSCOPY (EGD);  Surgeon: Ladene Artist, MD;  Location[facility] ENDOSCOPY;  Service: Endoscopy;  Laterality: N/A;  . ESOPHAGOGASTRODUODENOSCOPY (EGD) WITH PROPOFOL N/A 05/01/2016   Procedure: ESOPHAGOGASTRODUODENOSCOPY (EGD) WITH PROPOFOL;  Surgeon: Jerene Bears, MD;  Location: [facility] ENDOSCOPY;  Service: Endoscopy;  Laterality: N/A;  .  INGUINAL HERNIA REPAIR    . MELANOMA EXCISION Left 02/2011   malignant melanoma of thigh    No Known Allergies  Allergies as of 03/31/2017   No Known Allergies     Medication List       Accurate as of 03/31/17 10:02 AM. Always use your most recent med list.          acetaminophen 325 MG tablet Commonly known as:  TYLENOL Take 650 mg by mouth every 6 (six) hours as needed for pain.   ALPRAZolam 0.25 MG tablet Commonly known as:  XANAX Take 1 tablet (0.25 mg total) by mouth every 8 (eight) hours as needed for anxiety.   aspirin 81 MG EC tablet Take 1 tablet (81 mg total) by mouth  daily.   clopidogrel 75 MG tablet Commonly known as:  PLAVIX TAKE ONE TABLET EACH DAY   isosorbide mononitrate 30 MG 24 hr tablet Commonly known as:  IMDUR Take 30 mg by mouth daily.   ketoconazole 2 % shampoo Commonly known as:  NIZORAL Apply 1 application topically 2 (two) times a week. To scalp and hair during whirlpool in rehab   metoprolol succinate 50 MG 24 hr tablet Commonly known as:  TOPROL-XL TAKE ONE TABLET EACH DAY   mirtazapine 15 MG tablet Commonly known as:  REMERON TAKE ONE-HALF TABLET AT BEDTIME   multivitamin with minerals Tabs tablet Take 1 tablet by mouth daily.   mupirocin ointment 2 % Commonly known as:  BACTROBAN Apply to toe wound daily after bathing to help bacterial growth in wound   nitroGLYCERIN 0.4 MG SL tablet Commonly known as:  NITROSTAT Place 0.4 mg under the tongue every 5 (five) minutes as needed for chest pain.   pantoprazole 40 MG tablet Commonly known as:  PROTONIX TAKE ONE TABLET TWICE DAILY   simvastatin 20 MG tablet Commonly known as:  ZOCOR TAKE ONE TABLET AT BEDTIME   torsemide 20 MG tablet Commonly known as:  DEMADEX Take 40 mg by mouth every morning. Take 20 mg by mouth every evening   torsemide 20 MG tablet Commonly known as:  DEMADEX Take 20 mg by mouth as needed (for 3lbs weight gain overnight or 5lbs in one week).   torsemide 20 MG tablet Commonly known as:  DEMADEX TAKE 2 TABLETS IN THE MORNING AND TAKE ONE TABLET IN THE EVENING       Review of Systems:  Review of Systems  Constitutional: Positive for malaise/fatigue. Negative for chills and fever.  HENT: Positive for hearing loss. Negative for congestion.   Eyes: Negative for blurred vision.       Glasses  Respiratory: Negative for shortness of breath.   Cardiovascular: Negative for chest pain and palpitations.  Gastrointestinal: Negative for abdominal pain, blood in stool, constipation, diarrhea and melena.       Dark tarry stool last week resolved  now  Genitourinary: Negative for dysuria.  Musculoskeletal: Negative for falls.  Skin: Negative for itching and rash.  Neurological: Positive for weakness. Negative for dizziness and loss of consciousness.  Endo/Heme/Allergies: Bruises/bleeds easily.  Psychiatric/Behavioral: Positive for memory loss.    Health Maintenance  Topic Date Due  . INFLUENZA VACCINE  06/09/2017  . TETANUS/TDAP  06/14/2024  . PNA vac Low Risk Adult  Completed    Physical Exam: Vitals:   03/31/17 0952  BP: 130/70  Pulse: 93  Temp: 98.5 F (36.9 C)  TempSrc: Oral  SpO2: (!) 89%  Weight: 145 lb (65.8 kg)   Body  mass index is 22.05 kg/m. Physical Exam  Constitutional: No distress.  Chronically ill, cachectic white male  HENT:  Head: Normocephalic and atraumatic.  Hearing aide  Eyes:  glasses  Cardiovascular: Normal rate, regular rhythm and intact distal pulses.   Murmur heard. Edema improved  Pulmonary/Chest: Effort normal. He has rales.  Coarse crackles at bilateral bases, on 3 liters via [place], appears dyspneic but denies  Abdominal: Soft. Bowel sounds are normal. He exhibits no distension. There is no tenderness.  Musculoskeletal: Normal range of motion.  Kyphotic posture  Neurological: He is alert.  Oriented to person and place only--see mmse  Skin: Skin is warm and dry. Capillary refill takes less than 2 seconds.  Toe cellulitis and wound healed  Psychiatric: He has a normal mood and affect. His behavior is normal. Judgment and thought content normal.    Labs reviewed: Basic Metabolic Panel:  Recent Labs  06/30/16 0508  07/02/16 0542  07/15/16 1106  10/06/16 1155 11/12/16 1514 12/24/16 1509 03/18/17  NA 138  < > 138  < > 137  < > 141 142 140 141  K 3.6  < > 3.7  < > 4.6  < > 5.2* 5.1 5.0 5.2  CL 98*  < > 97*  --  104  < > 107 108 95*  --   CO2 30  < > 32  --  24  < > 25 24 32  --   GLUCOSE 137*  < > 107*  --  147*  < > 84 118* 84  --   BUN 76*  < > 80*  < > 87*  < > 58* 79*  61* 63*  CREATININE 2.67*  < > 2.36*  < > 2.17*  < > 2.43* 2.93* 2.34* 2.2*  CALCIUM 8.5*  < > 8.8*  --  9.0  < > 9.1 9.1 9.1  --   MG 1.6*  --  1.9  --  1.9  --   --   --   --   --   < > = values in this interval not displayed. Liver Function Tests:  Recent Labs  04/13/16 1046 04/30/16 0422 06/12/16 0556 03/23/17 0800  AST 21 19 24 20   ALT 17 15* 15* 8*  ALKPHOS 75 63 85 86  BILITOT 0.7 0.6 0.6  --   PROT 6.7 5.5* 6.0*  --   ALBUMIN 3.5 2.7* 3.0*  --    No results for input(s): LIPASE, AMYLASE in the last 8760 hours. No results for input(s): AMMONIA in the last 8760 hours. CBC:  Recent Labs  05/28/16 1556 06/12/16 0556 06/23/16 0705  07/26/16 0447  07/28/16 0010 10/06/16 1155 03/18/17  WBC 9.7 8.3 16.2*  < > 8.2  --  8.4 7.8 7.7  NEUTROABS 7.5 5.8 12.1*  --   --   --   --   --   --   HGB 11.6* 10.3* 11.1*  < > 10.3*  < > 10.3* 10.6* 10.4*  HCT 35.3* 33.3* 35.2*  < > 33.3*  < > 33.2* 34.9* 34*  MCV 93.9 99.7 100.0  < > 97.7  --  98.2 101.7*  --   PLT 234.0 229 369  < > 248  --  243 167 150  < > = values in this interval not displayed. Lipid Panel:  Recent Labs  04/30/16 0422 03/23/17 0800  CHOL 93 138  HDL 32* 94*  LDLCALC 35 30  TRIG 129 71  CHOLHDL 2.9  --  Lab Results  Component Value Date   HGBA1C 5.7 02/22/2015    Assessment/Plan 1. Chronic respiratory failure with hypoxia (HCC) -multifactorial with chf, ckd, cirrhosis, anemia -cont O2 at 3 liters via [place]  2. Acute on chronic combined systolic and diastolic heart failure (HCC) -cont toprol, plavix for cad, imdur, torsemide 40mg  in am, 20mg  in pm and 20mg  as needed for 3 lbs weight gain in 1 day or 5 lbs in 1 week  3. CKD (chronic kidney disease) stage 3, GFR 30-59 ml/min -cont to avoid nephrotoxic agents, hydrate with water  4. Chronic venous insufficiency -edema of bilateral LEs improved with resuming compression hose  5. Dementia with behavioral disturbance, unspecified dementia  type -scored 22/30 and passed clock -spirits improved today, sometimes rude, but mostly joking and pleasant  6. Alcoholic cirrhosis of liver without ascites (HCC) -ongoing, continues to drink excessive wine per aides that work with him -counseled on decreasing intake and replacing with water  7. Anemia, unspecified type -will do formal workup with retic count, ferritin, iron panel and repeat cbc -likely needs folic acid and thiamine added  Labs/tests ordered:  Cbc, retic, ferritin, iron panel Next appt:  05/05/2017 labs before  Ramari Bray L. Takima Encina, D.O. [facility] [address], [place] [address] Cell Phone (Mon-Fri 8am-5pm):  [phone] On Call:  [phone] & follow prompts after 5pm & weekends Office Phone:  [phone] Office Fax:  [phone]

## 2017-04-15 ENCOUNTER — Other Ambulatory Visit: Payer: Self-pay | Admitting: Internal Medicine

## 2017-04-19 DIAGNOSIS — I872 Venous insufficiency (chronic) (peripheral): Secondary | ICD-10-CM | POA: Diagnosis not present

## 2017-04-19 DIAGNOSIS — R0602 Shortness of breath: Secondary | ICD-10-CM | POA: Diagnosis not present

## 2017-04-19 DIAGNOSIS — I5041 Acute combined systolic (congestive) and diastolic (congestive) heart failure: Secondary | ICD-10-CM | POA: Diagnosis not present

## 2017-04-19 DIAGNOSIS — I5040 Unspecified combined systolic (congestive) and diastolic (congestive) heart failure: Secondary | ICD-10-CM | POA: Diagnosis not present

## 2017-04-19 DIAGNOSIS — D649 Anemia, unspecified: Secondary | ICD-10-CM | POA: Diagnosis not present

## 2017-04-19 LAB — BASIC METABOLIC PANEL
BUN: 127 — AB (ref 4–21)
Creatinine: 6.1 — AB (ref 0.6–1.3)
Glucose: 116
Potassium: 5.8 — AB (ref 3.4–5.3)
Sodium: 140 (ref 137–147)

## 2017-04-19 LAB — CBC AND DIFFERENTIAL
HCT: 30 — AB (ref 41–53)
Hemoglobin: 10 — AB (ref 13.5–17.5)
Platelets: 190 (ref 150–399)
WBC: 11.8

## 2017-04-20 ENCOUNTER — Encounter: Payer: Self-pay | Admitting: Internal Medicine

## 2017-04-20 ENCOUNTER — Non-Acute Institutional Stay (SKILLED_NURSING_FACILITY): Payer: Medicare Other | Admitting: Internal Medicine

## 2017-04-20 DIAGNOSIS — N183 Chronic kidney disease, stage 3 (moderate): Secondary | ICD-10-CM

## 2017-04-20 DIAGNOSIS — K7030 Alcoholic cirrhosis of liver without ascites: Secondary | ICD-10-CM

## 2017-04-20 DIAGNOSIS — I5043 Acute on chronic combined systolic (congestive) and diastolic (congestive) heart failure: Secondary | ICD-10-CM | POA: Diagnosis not present

## 2017-04-20 DIAGNOSIS — N179 Acute kidney failure, unspecified: Secondary | ICD-10-CM

## 2017-04-20 DIAGNOSIS — Z7189 Other specified counseling: Secondary | ICD-10-CM

## 2017-04-20 DIAGNOSIS — F0391 Unspecified dementia with behavioral disturbance: Secondary | ICD-10-CM

## 2017-04-20 DIAGNOSIS — D649 Anemia, unspecified: Secondary | ICD-10-CM

## 2017-04-20 DIAGNOSIS — J9621 Acute and chronic respiratory failure with hypoxia: Secondary | ICD-10-CM | POA: Diagnosis not present

## 2017-04-20 NOTE — Progress Notes (Signed)
Patient ID: [name], male   DOB: 09-Jan-1929, 83 y.o.   MRN: [id number]  Provider:  Rexene Edison. Mariea [name], D.O., C.M.D. Location:  [place] Room Number: [place] of Service:  SNF (31)  PCP: [name] Curry, DO Patient Care Team: [name] Curry, DO as PCP - General (Geriatric Medicine) [name] Bow, MD (Cardiology) [name] Feil, MD (Gastroenterology) [name] Mutter, MD (Ophthalmology)  Extended Emergency Contact Information Primary Emergency Contact: [name] [place] Phone: [phone] Relation: Son Secondary Emergency Contact: [name] of [place] Mobile Phone: [phone] Relation: Son  Code Status: DNR after discussion with hospice later today Goals of Care: Advanced Directive information Advanced Directives 04/20/2017  Does Patient Have a Medical Advance Directive? Yes  Type of Paramedic of [place];Living will  Does patient want to make changes to medical advance directive? -  Copy of [place] in Chart? Yes  Would patient like information on creating a medical advance directive? -  Pre-existing out of facility DNR order (yellow form or pink MOST form) -  Some encounter information is confidential and restricted. Go to Review Flowsheets activity to see all data.    Chief Complaint  Patient presents with  . New Admit To SNF    REHAB ADMISSION    HPI: Patient is a 83 y.o. male seen today for admission to [facility] rehab from his [place] apt due to severe weakness (he'd become a more than 2-person assist at home).  His intake has been poor since the end of last week (now Tuesday).  He's refused hospitalization.  Labs were done upon arrival.  He was found to be in acute on chronic renal failure, acute on chronic chf with crackles and labs were grossly abnormal with BUN 127, cr 6.1, BNP >35K.  Weight yesterday was 142 lbs.   Hgb/hct 10/20.  NP on call had recommended a hospice referral.    Of note, pt had been in chronic chf and ckd for several months as well as cirrhosis of the liver with continued alcohol abuse.  His dementia was progressing and he lacked insight into his illnesses.  With a great deal of effort, staff had managed to get him to take his medications, attend clinic appts with me and go to the whirlpool twice a week for baths.  When last seen in clinic, he was actually euvolemic, edema under control, toe wound healed, and being more adherent with his caregivers.  He was alert and communicative.    When seen today, he was lethargic, could only awaken for a few seconds to respond and not clearly to questions.  I met with his family (son [name], daughter and a caregiver) after the visit.  It is felt that currently, his volume status is difficult to determine.  He's got evidence of renal failure and appears clinically dry on exam, but has a very poorly functional heart, dyspnea and hypoxia.  We opted to avoid even attempting IVFs which could potentially worsen his respiratory failure and make him uncomfortable rather than reversing his condition.  His family expressed understanding of this and agreed to a hospice referral.  Pt initially adamantly wanted to maintain full code status.  He was more arousable later in the day, and hospice was able to discuss with him and he agreed to DNR b/c he did not want to go to the hospital.  Past Medical History:  Diagnosis Date  . Acute  combined systolic and diastolic CHF, NYHA class 1 ([place])   . Acute respiratory failure ([name])   . Alcoholic cirrhosis ([place])   . 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 Alcoholism ([place])   . Aortic stenosis   . Ascites 11/2004   "early" SBP on paracentesis.   . Chronic gastritis 05/01/2016  . CKD (chronic kidney disease), stage III 10/11/2015  . Colon polyp perhaps in 1990s   pathology/type not known  . Dementia   . Diverticulosis of colon  perhaps in 1990s  . Duodenal ulcer 04/2016  . Fracture of wrist 2014 spring vs early summer   refused orthopods suggestion to set the bone.   Marland Kitchen GERD (gastroesophageal reflux disease)   . HLD (hyperlipidemia)   . HTN (hypertension)   . Hypertrophic cardiomyopathy ([place])   . Portal hypertensive gastropathy ([place]) 2006  . Renal insufficiency   . Rhabdomyolysis 01/2005   Past Surgical History:  Procedure Laterality Date  . BASAL CELL CARCINOMA EXCISION Left 11/2012   lower eyelid  . ESOPHAGOGASTRODUODENOSCOPY N/A 08/04/2013   Procedure: ESOPHAGOGASTRODUODENOSCOPY (EGD);  Surgeon: Ladene Artist, MD;  Location[facility] ENDOSCOPY;  Service: Endoscopy;  Laterality: N/A;  . ESOPHAGOGASTRODUODENOSCOPY (EGD) WITH PROPOFOL N/A 05/01/2016   Procedure: ESOPHAGOGASTRODUODENOSCOPY (EGD) WITH PROPOFOL;  Surgeon: Jerene Bears, MD;  Location: [facility] ENDOSCOPY;  Service: Endoscopy;  Laterality: N/A;  . INGUINAL HERNIA REPAIR    . MELANOMA EXCISION Left 02/2011   malignant melanoma of thigh    reports that he has never smoked. He has never used smokeless tobacco. He reports that he does not drink alcohol or use drugs. Social History   Social History  . Marital status: Married    Spouse name: N/A  . Number of children: 4  . Years of education: 24   Occupational History  . pathologist Retired    retired   Social History Main Topics  . Smoking status: Never Smoker  . Smokeless tobacco: Never Used  . Alcohol use No     Comment: member of AA  . Drug use: No  . Sexual activity: Not on file   Other Topics Concern  . 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  Remains independent. ACP- not discussed, will approach at next OV    Functional Status Survey:    Family History  Problem Relation Age of Onset  . Cancer Father 71       lung cancer  . Heart disease Mother   . Heart disease Sister     Health Maintenance  Topic Date Due  . INFLUENZA  VACCINE  06/09/2017  . TETANUS/TDAP  06/14/2024  . PNA vac Low Risk Adult  Completed    No Known Allergies  Outpatient Encounter Prescriptions as of 04/20/2017  Medication Sig  . acetaminophen (TYLENOL) 325 MG tablet Take 650 mg by mouth every 6 (six) hours as needed for pain.  Marland Kitchen ALPRAZolam (XANAX) 0.25 MG tablet TAKE ONE TABLET EVERY EIGHT HOURS AS NEEDED FOR ANXIETY  . aspirin EC 81 MG EC tablet Take 1 tablet (81 mg total) by mouth daily.  . clopidogrel (PLAVIX) 75 MG tablet TAKE ONE TABLET EACH DAY  . isosorbide mononitrate (IMDUR) 30 MG 24 hr tablet Take 30 mg by mouth daily.  Marland Kitchen ketoconazole (NIZORAL) 2 % shampoo Apply 1 application topically 2 (two) times a week. To scalp and hair during whirlpool in rehab  . metoprolol succinate (TOPROL-XL) 50 MG 24 hr tablet  TAKE ONE TABLET EACH DAY  . mirtazapine (REMERON) 15 MG tablet TAKE ONE-HALF TABLET AT BEDTIME  . Multiple Vitamin (MULTIVITAMIN WITH MINERALS) TABS Take 1 tablet by mouth daily.  . nitroGLYCERIN (NITROSTAT) 0.4 MG SL tablet Place 0.4 mg under the tongue every 5 (five) minutes as needed for chest pain.  . pantoprazole (PROTONIX) 40 MG tablet TAKE ONE TABLET TWICE DAILY  . simvastatin (ZOCOR) 20 MG tablet TAKE ONE TABLET AT BEDTIME  . torsemide (DEMADEX) 20 MG tablet Take 40 mg by mouth every morning. Take 20 mg by mouth every evening  . torsemide (DEMADEX) 20 MG tablet Take 20 mg by mouth as needed (for 3lbs weight gain overnight or 5lbs in one week).  . torsemide (DEMADEX) 20 MG tablet TAKE 2 TABLETS IN THE MORNING AND TAKE ONE TABLET IN THE EVENING  . [DISCONTINUED] mupirocin ointment (BACTROBAN) 2 % Apply to toe wound daily after bathing to help bacterial growth in wound   No facility-administered encounter medications on file as of 04/20/2017.     Review of Systems  Unable to perform ROS: Acuity of condition (very lethargic and minimally responsive this morning, see hpi for info from staff and family)    Vitals:    04/20/17 0951  BP: 114/69  Pulse: 85  Resp: 16  Temp: 97.1 F (36.2 C)  TempSrc: Oral  SpO2: 97%   There is no height or weight on file to calculate BMI. Physical Exam  Constitutional: No distress.  Lethargic minimally arousable, having myoclonic jerks  HENT:  Head: Normocephalic and atraumatic.  Eyes: Conjunctivae and EOM are normal. Pupils are equal, round, and reactive to light.  Neck: Neck supple. No JVD present.  Cardiovascular: Normal rate, regular rhythm and intact distal pulses.   Murmur heard. Pulmonary/Chest: Effort normal. No respiratory distress. He has no wheezes. He has no rales.  Abdominal: Soft. Bowel sounds are normal. He exhibits no distension. There is no tenderness.  Musculoskeletal: Normal range of motion.  Lymphadenopathy:    He has no cervical adenopathy.  Neurological:  Lethargic, disoriented  Skin: Skin is warm and dry. There is pallor.    Labs reviewed: Basic Metabolic Panel:  Recent Labs  06/30/16 0508  07/02/16 0542  07/15/16 1106  10/06/16 1155 11/12/16 1514 12/24/16 1509 03/18/17 04/19/17 0400  NA 138  < > 138  < > 137  < > 141 142 140 141 140  K 3.6  < > 3.7  < > 4.6  < > 5.2* 5.1 5.0 5.2 5.8*  CL 98*  < > 97*  --  104  < > 107 108 95*  --   --   CO2 30  < > 32  --  24  < > 25 24 32  --   --   GLUCOSE 137*  < > 107*  --  147*  < > 84 118* 84  --   --   BUN 76*  < > 80*  < > 87*  < > 58* 79* 61* 63* 127*  CREATININE 2.67*  < > 2.36*  < > 2.17*  < > 2.43* 2.93* 2.34* 2.2* 6.1*  CALCIUM 8.5*  < > 8.8*  --  9.0  < > 9.1 9.1 9.1  --   --   MG 1.6*  --  1.9  --  1.9  --   --   --   --   --   --   < > = values in this interval not  displayed. Liver Function Tests:  Recent Labs  04/30/16 0422 06/12/16 0556 03/23/17 0800  AST 19 24 20   ALT 15* 15* 8*  ALKPHOS 63 85 86  BILITOT 0.6 0.6  --   PROT 5.5* 6.0*  --   ALBUMIN 2.7* 3.0*  --    No results for input(s): LIPASE, AMYLASE in the last 8760 hours. No results for input(s):  AMMONIA in the last 8760 hours. CBC:  Recent Labs  05/28/16 1556 06/12/16 0556 06/23/16 0705  07/26/16 0447  07/28/16 0010 10/06/16 1155 03/18/17 04/19/17 0400  WBC 9.7 8.3 16.2*  < > 8.2  --  8.4 7.8 7.7 11.8  NEUTROABS 7.5 5.8 12.1*  --   --   --   --   --   --   --   HGB 11.6* 10.3* 11.1*  < > 10.3*  < > 10.3* 10.6* 10.4* 10.0*  HCT 35.3* 33.3* 35.2*  < > 33.3*  < > 33.2* 34.9* 34* 30*  MCV 93.9 99.7 100.0  < > 97.7  --  98.2 101.7*  --   --   PLT 234.0 229 369  < > 248  --  243 167 150 190  < > = values in this interval not displayed. Cardiac Enzymes:  Recent Labs  06/23/16 0705 07/26/16 0447 07/26/16 1059  TROPONINI 0.13* 0.40* 1.03*   BNP: Invalid input(s): POCBNP Lab Results  Component Value Date   HGBA1C 5.7 02/22/2015   Lab Results  Component Value Date   TSH 0.74 10/23/2015   Lab Results  Component Value Date   VITAMINB12 562 05/26/2011   Lab Results  Component Value Date   FOLATE >24.8 05/26/2011   Lab Results  Component Value Date   IRON 38 (L) 05/26/2011   FERRITIN 16.4 (L) 05/26/2011    Assessment/Plan 1. Acute on chronic respiratory failure with hypoxia (HCC) -opted not to rehydrate for risk of worsening his respiratory status and causing volume overload -cont oxygen for comfort, add roxanol and ativan for pain, dyspnea and anxiety respectively -CXR shows possible pneumonia vs. chf  2. Acute on chronic combined systolic and diastolic heart failure (HCC) -BNP sky high, but also in renal failure which affects this value -holding diuretic due to renal function and difficulty taking po with lethargy -monitor and keep comfortable, await hospice input  3. Acute renal failure superimposed on stage 3 chronic kidney disease, unspecified acute renal failure type ([place]) -suddenly worse in past 2 wks -intake has declined so likely in ATN at this point with this in combination with ongoing diuretics until now -comfort measures initiated  4.  Dementia with behavioral disturbance, unspecified dementia type -pt lacks insight into disease, has his daughter appointed as primary hcpoa and living will indicates he would not want his life prolonged in this state--he agreed to DNR with hospice  5. Alcoholic cirrhosis of liver without ascites ([place]) -had been drinking considerable amounts of alcohol until end of last week, but no overt DTs which is surprising  6. Anemia, unspecified type -has been ongoing, also contributing to respiratory failure  7.  Advance care planning -about 30 mins total spent in two intervals today discussing his current illness, goals of care and resuscitation status with family, agreed to hospice referral and withholding fluids  Family/ staff Communication: discussed with son, daughter, caregiver, rehab nurse, nurse manager, DON  Labs/tests ordered: no new, hospital referral placed  [place]. Rawleigh Rode, D.O. [facility] [address]  [place], [place] [address] Cell Phone (Mon-Fri 8am-5pm):  [phone] On Call:  [phone] & follow prompts after 5pm & weekends Office Phone:  [phone] Office Fax:  [phone]

## 2017-05-05 ENCOUNTER — Encounter: Payer: Self-pay | Admitting: Internal Medicine

## 2017-05-09 DEATH — deceased

## 2017-06-21 IMAGING — DX DG CHEST 2V
2 series · 2 of 2 positions shown · non-contrast
Comparison: June 12, 2016

CLINICAL DATA: Shortness of Breath

EXAM:
CHEST  2 VIEW

[chest lat]
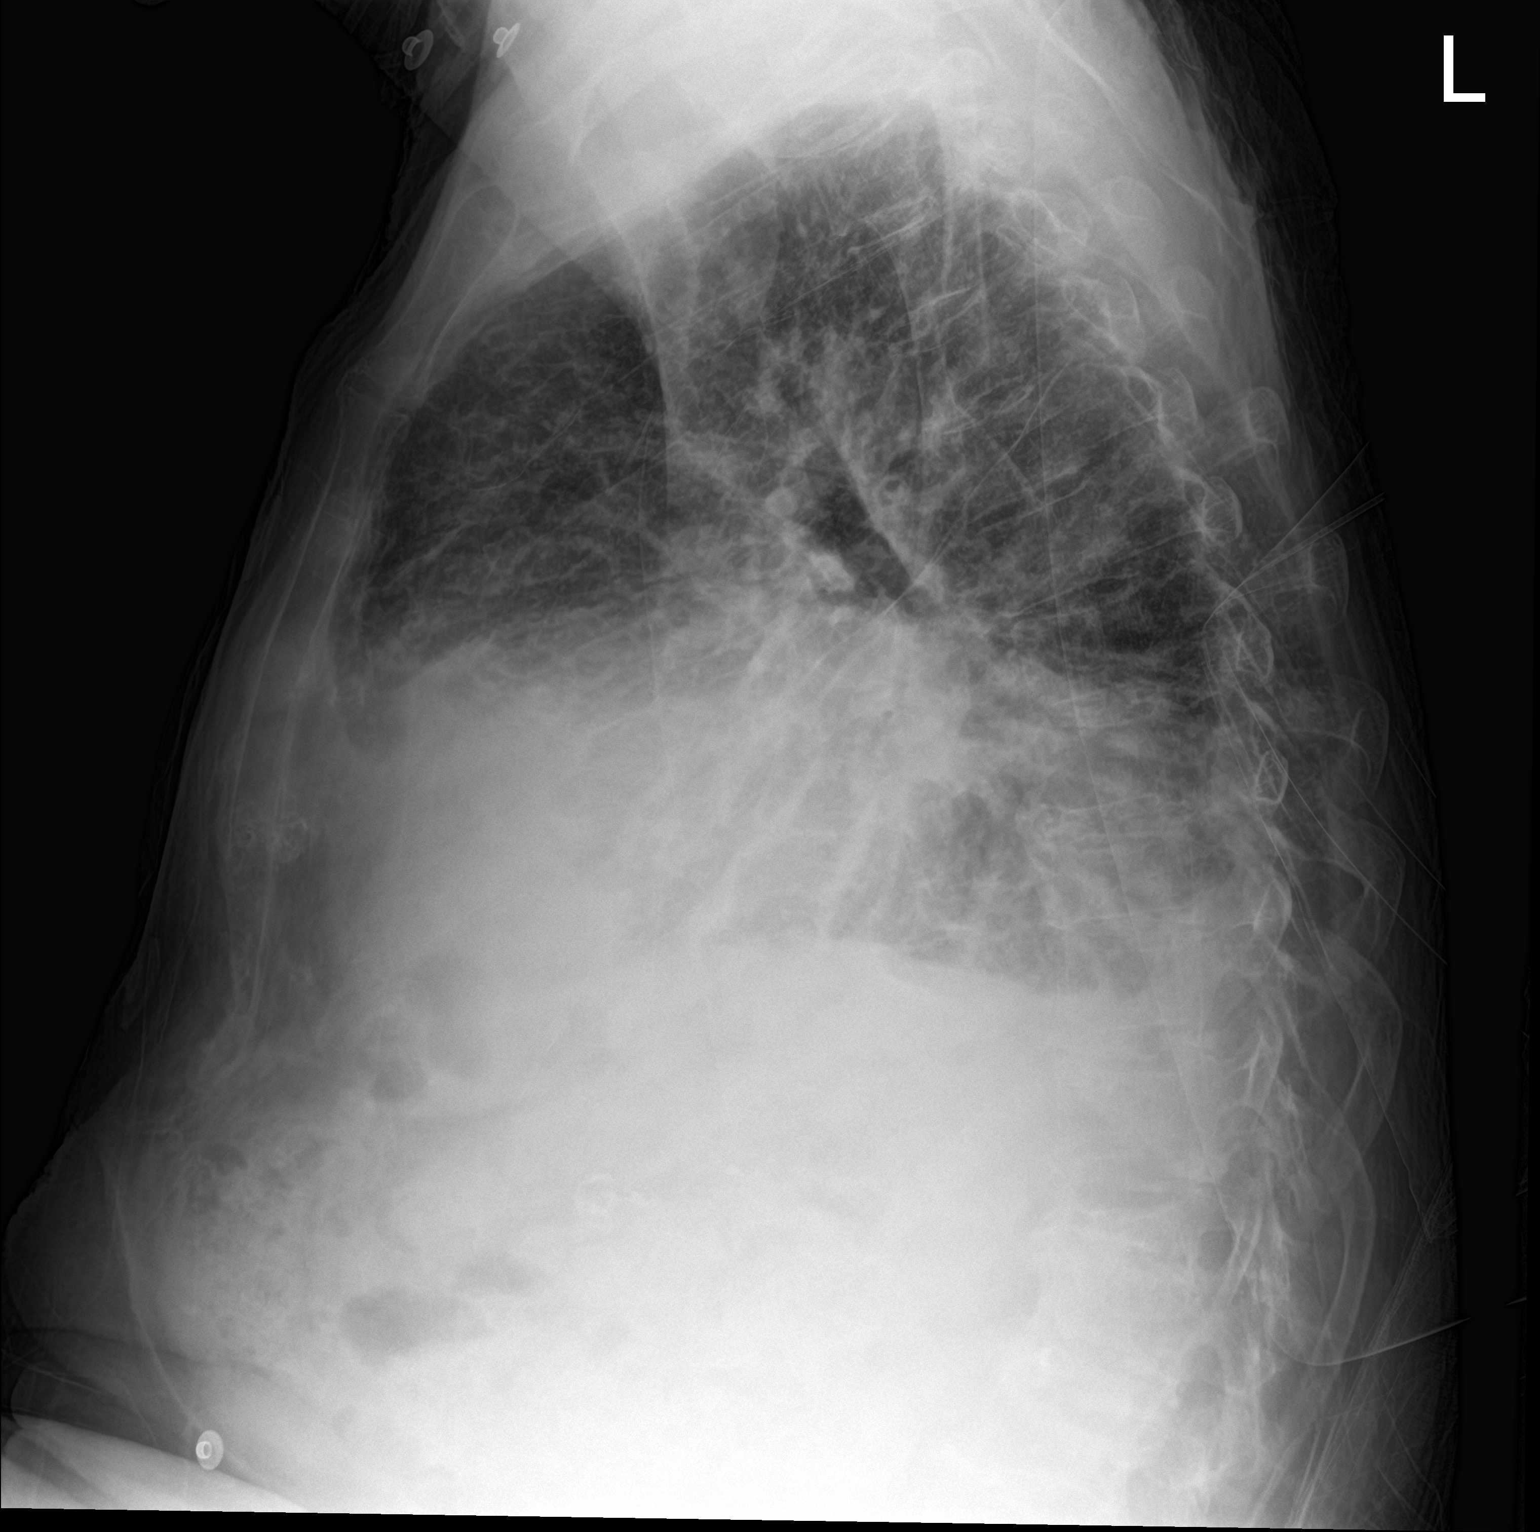

[chest ap]
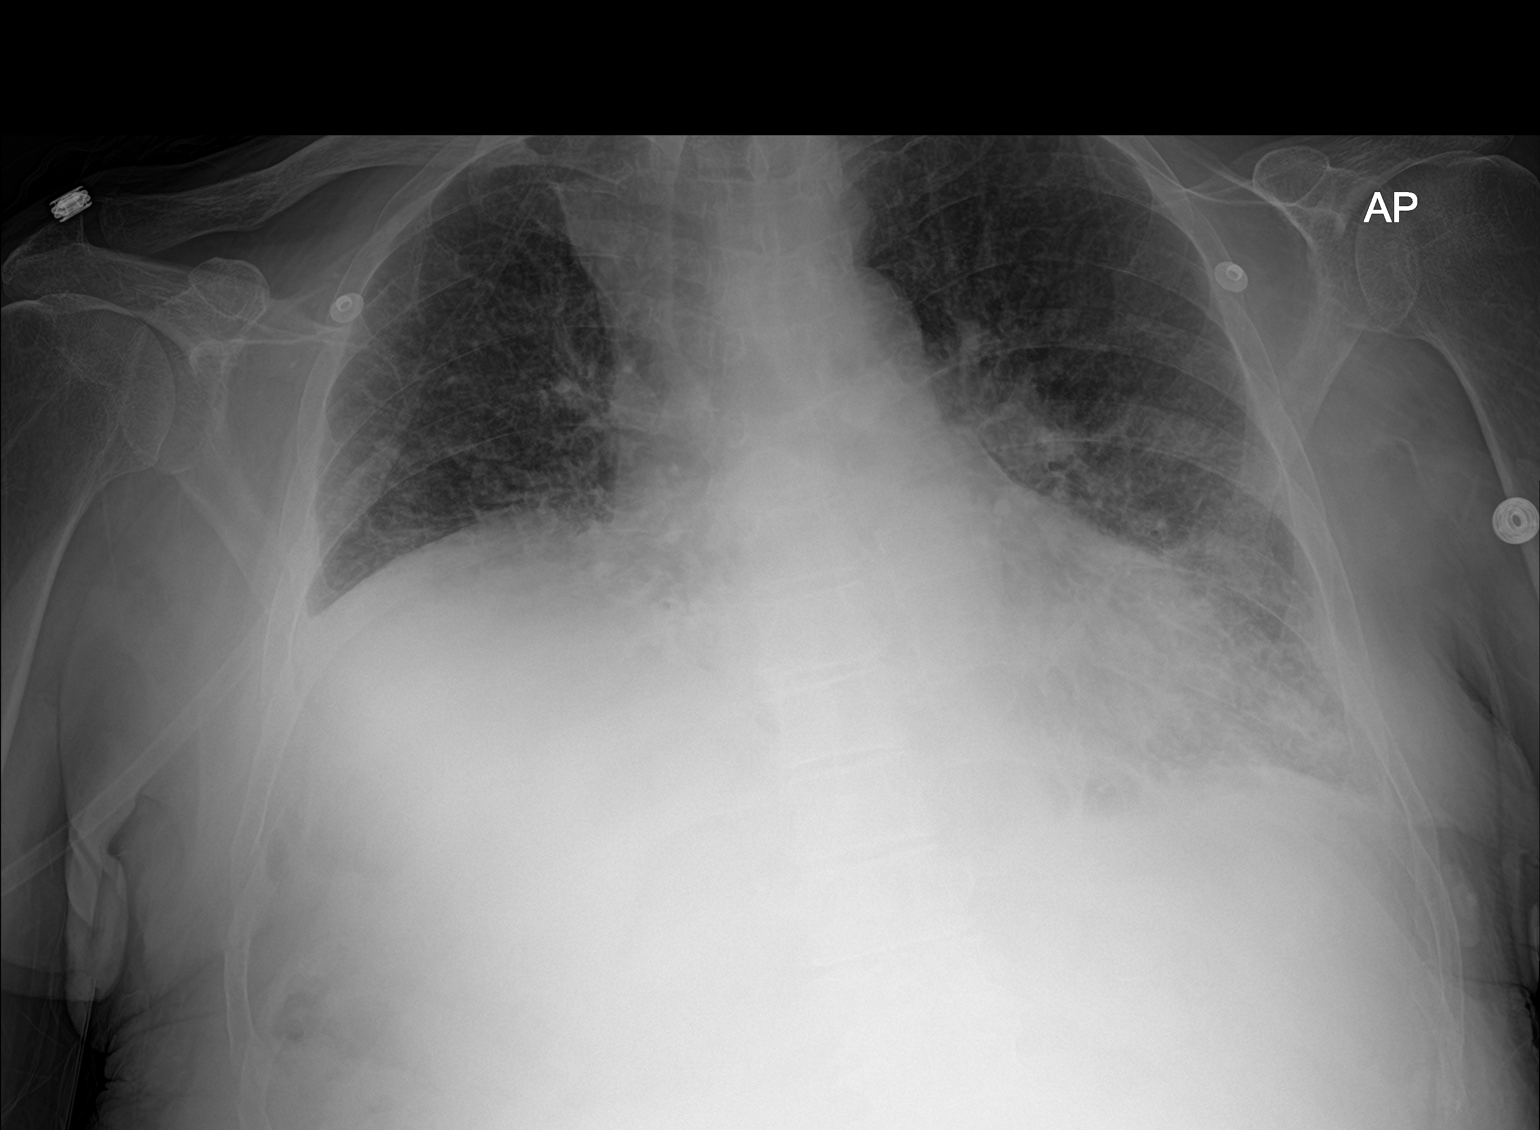

[2 of 2 positions shown; findings below may reference images not displayed]

FINDINGS: There remains airspace consolidation throughout much of the left
lower lobe with small left effusion. There is stable elevation of
the right hemidiaphragm. There is underlying mild chronic
interstitial prominence without frank edema. Heart size is within
normal limits. The pulmonary vascularity is normal. No adenopathy.
There is degenerative change in the thoracic spine.
IMPRESSION: Persistent left lower lobe airspace consolidation small left
effusion. Mild underlying interstitial prominence, probably
representing chronic inflammatory type change. No new opacity.
Stable cardiac silhouette. Stable elevation of the right
hemidiaphragm.

## 2017-06-22 IMAGING — CR DG CHEST 2V
2 series · 2 of 2 positions shown · non-contrast
Comparison: PA and lateral chest x-ray June 23, 2016

CLINICAL DATA: Acute CHF, shortness of breath, cardiomyopathy,
cirrhosis, chronic renal insufficiency

EXAM:
CHEST  2 VIEW

[chest lat]
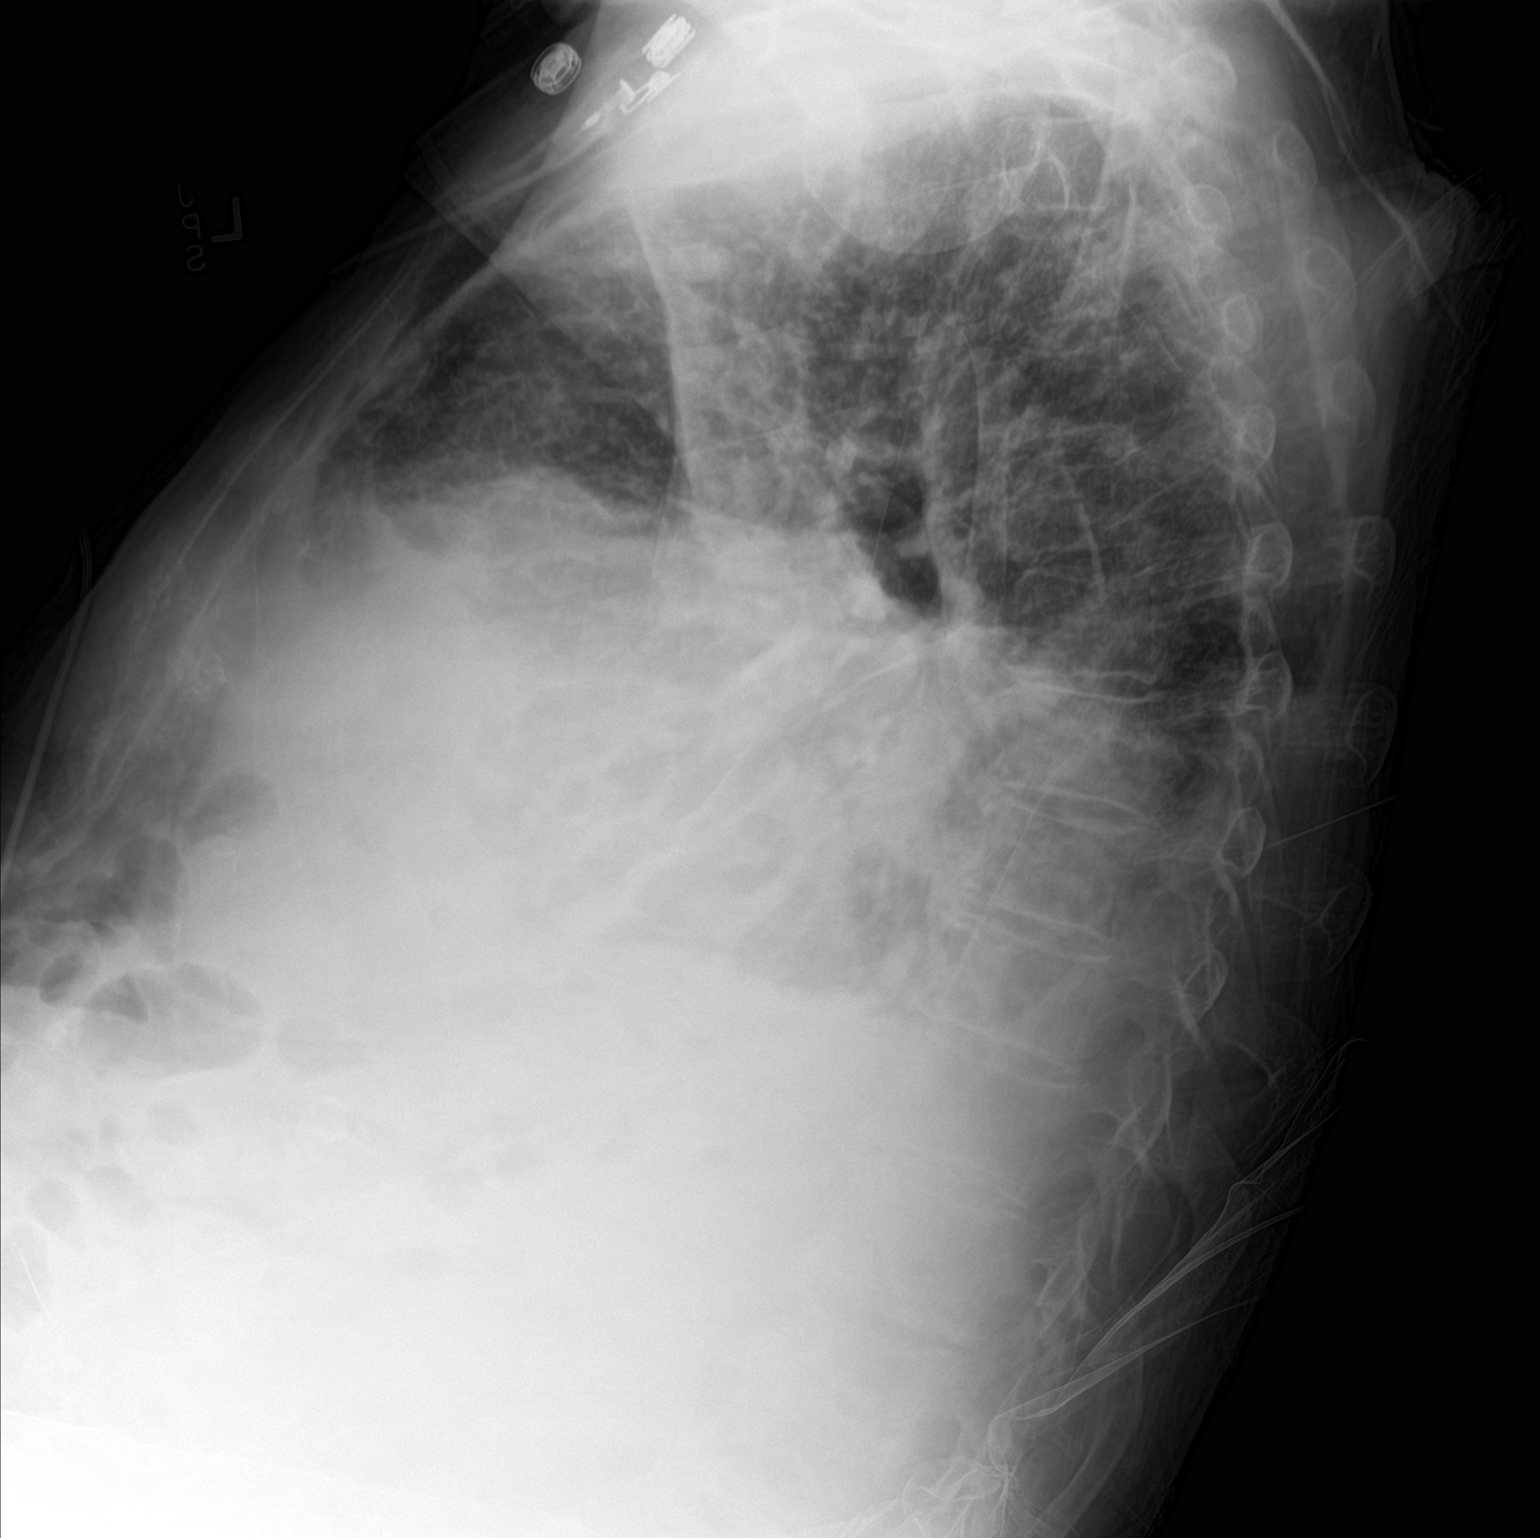

[chest ap]
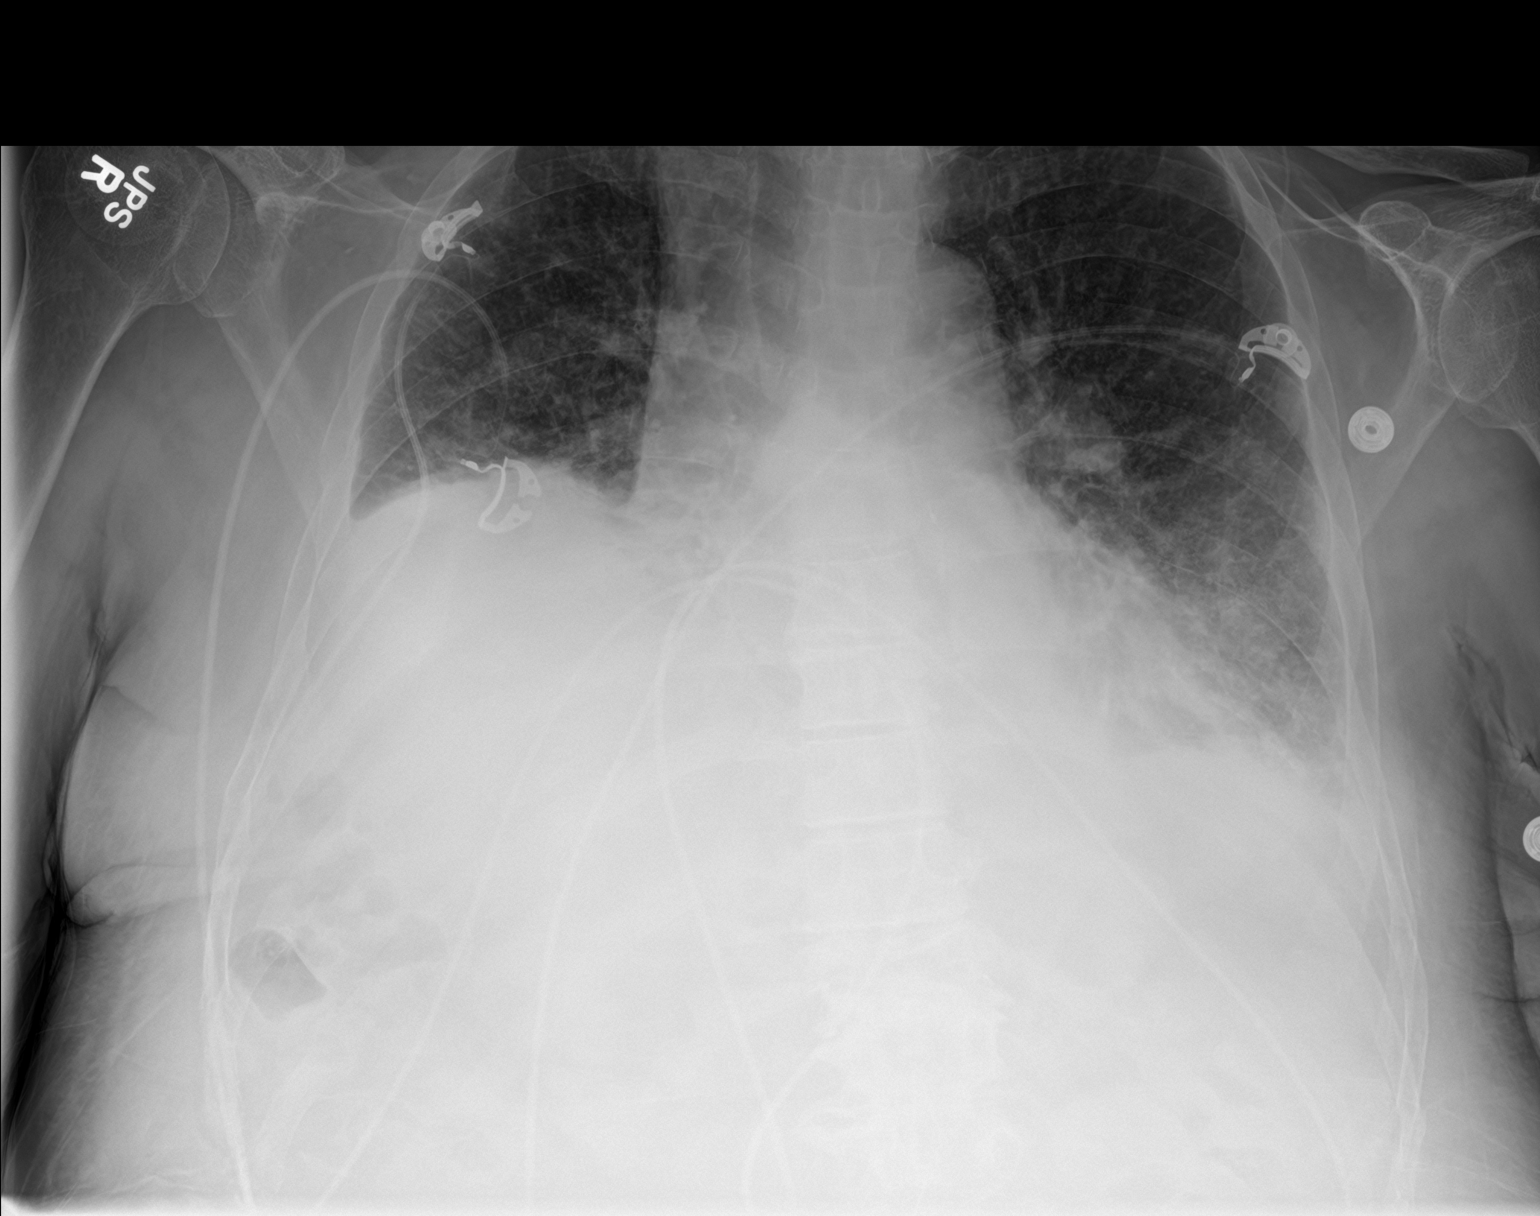

[2 of 2 positions shown; findings below may reference images not displayed]

FINDINGS: There remains volume loss on the right with elevation of the
hemidiaphragm. The left lung is adequately inflated. Coarse
interstitial markings are present in the lower left lung. There is
partial obscuration of the hemidiaphragm. The cardiac silhouette is
enlarged. The pulmonary vascularity is mildly prominent centrally.
IMPRESSION: Allowing for differences in positioning there has not been
significant interval change since yesterday's study. Persistent left
basilar atelectasis or pneumonia with trace left pleural effusion.
Stable cardiomegaly.

## 2017-07-24 IMAGING — CR DG CHEST 1V PORT
1 series · 1 of 1 positions shown · non-contrast
Comparison: Radiograph 06/27/2016

CLINICAL DATA: Shortness of breath tonight.

EXAM:
PORTABLE CHEST 1 VIEW

[AP]
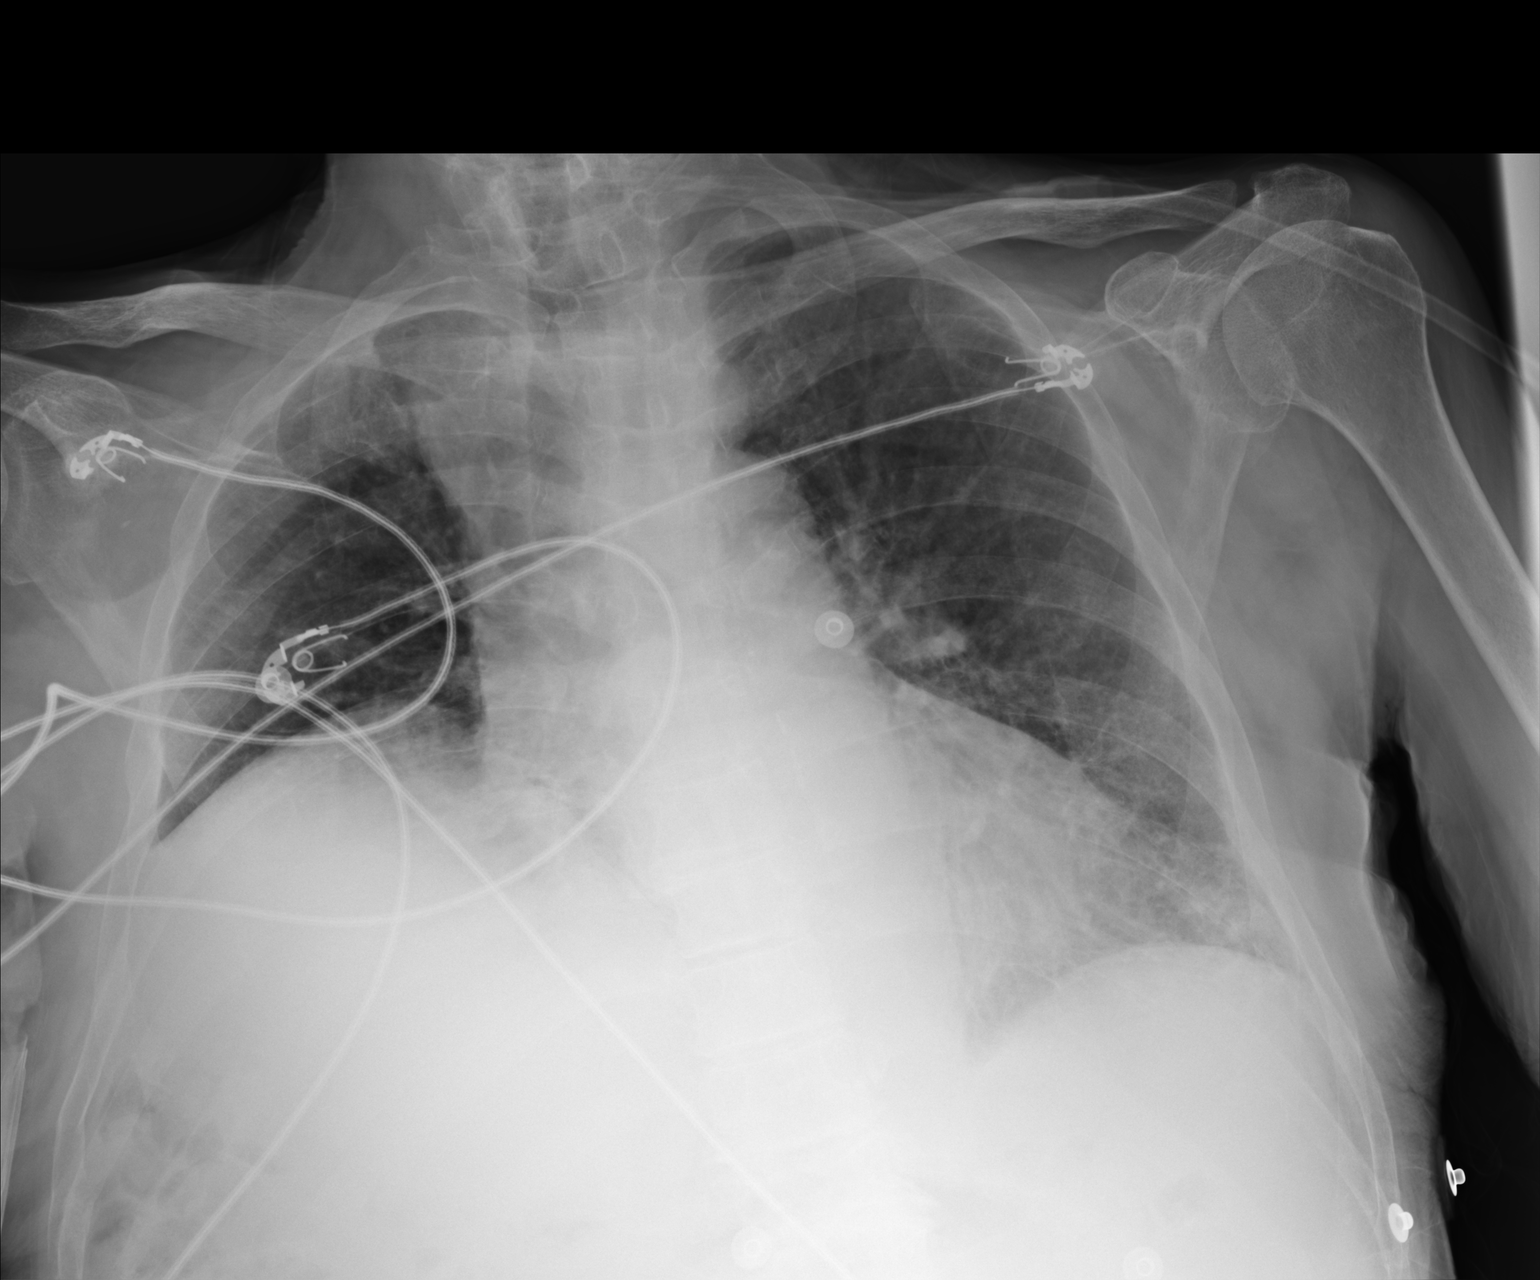

[1 of 1 positions shown; findings below may reference images not displayed]

FINDINGS: Chronic elevation of right hemidiaphragm. Improved bibasilar
aeration with resolve it right lower lung zone opacity. Minimal
persistent atelectasis at the left lung base. Cardiomediastinal
contours are unchanged. No new focal airspace opacity. No
pneumothorax. Unchanged osseous structures.
IMPRESSION: Improved lung aeration from prior exam. Minimal residual left
basilar atelectasis.

Chronic elevation of the right hemidiaphragm.
